# Patient Record
Sex: Male | Born: 1955 | Race: Black or African American | Hispanic: No | Marital: Married | Smoking: Current every day smoker
Health system: Southern US, Community
[De-identification: ages and names within clinical notes are randomized; demographics above are authoritative.]

## PROBLEM LIST (undated history)

## (undated) DIAGNOSIS — W19XXXA Unspecified fall, initial encounter: Secondary | ICD-10-CM

## (undated) DIAGNOSIS — I428 Other cardiomyopathies: Secondary | ICD-10-CM

## (undated) DIAGNOSIS — F1010 Alcohol abuse, uncomplicated: Secondary | ICD-10-CM

## (undated) DIAGNOSIS — I509 Heart failure, unspecified: Secondary | ICD-10-CM

## (undated) DIAGNOSIS — I10 Essential (primary) hypertension: Secondary | ICD-10-CM

## (undated) DIAGNOSIS — R569 Unspecified convulsions: Secondary | ICD-10-CM

## (undated) DIAGNOSIS — I639 Cerebral infarction, unspecified: Secondary | ICD-10-CM

## (undated) DIAGNOSIS — G40909 Epilepsy, unspecified, not intractable, without status epilepticus: Secondary | ICD-10-CM

## (undated) DIAGNOSIS — T82198A Other mechanical complication of other cardiac electronic device, initial encounter: Secondary | ICD-10-CM

## (undated) DIAGNOSIS — Z95810 Presence of automatic (implantable) cardiac defibrillator: Secondary | ICD-10-CM

## (undated) DIAGNOSIS — N179 Acute kidney failure, unspecified: Secondary | ICD-10-CM

## (undated) DIAGNOSIS — M1990 Unspecified osteoarthritis, unspecified site: Secondary | ICD-10-CM

## (undated) HISTORY — DX: Other cardiomyopathies: I42.8

---
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
## 2001-01-30 ENCOUNTER — Emergency Department (HOSPITAL_COMMUNITY): Admission: EM | Admit: 2001-01-30 | Discharge: 2001-01-30 | Payer: Self-pay | Admitting: *Deleted

## 2003-06-19 ENCOUNTER — Emergency Department (HOSPITAL_COMMUNITY): Admission: EM | Admit: 2003-06-19 | Discharge: 2003-06-19 | Payer: Self-pay | Admitting: Emergency Medicine

## 2003-06-20 ENCOUNTER — Encounter (INDEPENDENT_AMBULATORY_CARE_PROVIDER_SITE_OTHER): Payer: Self-pay | Admitting: Cardiology

## 2003-06-20 ENCOUNTER — Inpatient Hospital Stay (HOSPITAL_COMMUNITY): Admission: EM | Admit: 2003-06-20 | Discharge: 2003-06-29 | Payer: Self-pay | Admitting: Emergency Medicine

## 2004-05-31 ENCOUNTER — Emergency Department (HOSPITAL_COMMUNITY): Admission: EM | Admit: 2004-05-31 | Discharge: 2004-05-31 | Payer: Self-pay

## 2004-09-03 ENCOUNTER — Emergency Department (HOSPITAL_COMMUNITY): Admission: EM | Admit: 2004-09-03 | Discharge: 2004-09-03 | Payer: Self-pay | Admitting: Emergency Medicine

## 2005-04-19 ENCOUNTER — Emergency Department (HOSPITAL_COMMUNITY): Admission: EM | Admit: 2005-04-19 | Discharge: 2005-04-19 | Payer: Self-pay | Admitting: *Deleted

## 2005-05-27 HISTORY — PX: CARDIAC DEFIBRILLATOR PLACEMENT: SHX171

## 2005-06-07 ENCOUNTER — Ambulatory Visit: Payer: Self-pay | Admitting: Internal Medicine

## 2005-06-11 ENCOUNTER — Ambulatory Visit: Payer: Self-pay | Admitting: *Deleted

## 2005-06-12 ENCOUNTER — Inpatient Hospital Stay (HOSPITAL_COMMUNITY): Admission: EM | Admit: 2005-06-12 | Discharge: 2005-06-24 | Payer: Self-pay | Admitting: Emergency Medicine

## 2005-06-12 ENCOUNTER — Ambulatory Visit: Payer: Self-pay | Admitting: Pulmonary Disease

## 2005-06-23 ENCOUNTER — Encounter (INDEPENDENT_AMBULATORY_CARE_PROVIDER_SITE_OTHER): Payer: Self-pay | Admitting: *Deleted

## 2005-07-10 ENCOUNTER — Ambulatory Visit: Payer: Self-pay | Admitting: Internal Medicine

## 2006-03-21 ENCOUNTER — Observation Stay (HOSPITAL_COMMUNITY): Admission: EM | Admit: 2006-03-21 | Discharge: 2006-03-21 | Payer: Self-pay | Admitting: Emergency Medicine

## 2006-09-01 ENCOUNTER — Ambulatory Visit: Payer: Self-pay | Admitting: Internal Medicine

## 2006-09-01 ENCOUNTER — Inpatient Hospital Stay (HOSPITAL_COMMUNITY): Admission: EM | Admit: 2006-09-01 | Discharge: 2006-09-03 | Payer: Self-pay | Admitting: Emergency Medicine

## 2006-12-30 ENCOUNTER — Emergency Department (HOSPITAL_COMMUNITY): Admission: EM | Admit: 2006-12-30 | Discharge: 2006-12-31 | Payer: Self-pay | Admitting: Emergency Medicine

## 2007-01-14 ENCOUNTER — Emergency Department (HOSPITAL_COMMUNITY): Admission: EM | Admit: 2007-01-14 | Discharge: 2007-01-14 | Payer: Self-pay | Admitting: Emergency Medicine

## 2007-05-07 ENCOUNTER — Emergency Department (HOSPITAL_COMMUNITY): Admission: EM | Admit: 2007-05-07 | Discharge: 2007-05-07 | Payer: Self-pay | Admitting: Emergency Medicine

## 2007-05-09 ENCOUNTER — Emergency Department (HOSPITAL_COMMUNITY): Admission: EM | Admit: 2007-05-09 | Discharge: 2007-05-09 | Payer: Self-pay | Admitting: *Deleted

## 2007-05-11 ENCOUNTER — Ambulatory Visit (HOSPITAL_COMMUNITY): Admission: RE | Admit: 2007-05-11 | Discharge: 2007-05-11 | Payer: Self-pay | Admitting: *Deleted

## 2009-11-30 ENCOUNTER — Encounter (INDEPENDENT_AMBULATORY_CARE_PROVIDER_SITE_OTHER): Payer: Self-pay | Admitting: Cardiology

## 2009-11-30 ENCOUNTER — Ambulatory Visit: Payer: Self-pay | Admitting: Vascular Surgery

## 2009-11-30 ENCOUNTER — Inpatient Hospital Stay (HOSPITAL_COMMUNITY): Admission: EM | Admit: 2009-11-30 | Discharge: 2009-12-07 | Payer: Self-pay | Admitting: Emergency Medicine

## 2009-12-01 ENCOUNTER — Encounter (INDEPENDENT_AMBULATORY_CARE_PROVIDER_SITE_OTHER): Payer: Self-pay | Admitting: Cardiology

## 2009-12-05 ENCOUNTER — Encounter (INDEPENDENT_AMBULATORY_CARE_PROVIDER_SITE_OTHER): Payer: Self-pay | Admitting: Cardiovascular Disease

## 2009-12-06 ENCOUNTER — Ambulatory Visit: Payer: Self-pay | Admitting: Psychiatry

## 2009-12-07 ENCOUNTER — Ambulatory Visit: Payer: Self-pay | Admitting: Psychiatry

## 2009-12-16 ENCOUNTER — Emergency Department (HOSPITAL_COMMUNITY): Admission: EM | Admit: 2009-12-16 | Discharge: 2009-12-16 | Payer: Self-pay | Admitting: Emergency Medicine

## 2010-08-12 LAB — CBC
HCT: 36 % — ABNORMAL LOW (ref 39.0–52.0)
HCT: 37.6 % — ABNORMAL LOW (ref 39.0–52.0)
HCT: 37.8 % — ABNORMAL LOW (ref 39.0–52.0)
Hemoglobin: 12.4 g/dL — ABNORMAL LOW (ref 13.0–17.0)
Hemoglobin: 12.6 g/dL — ABNORMAL LOW (ref 13.0–17.0)
Hemoglobin: 12.9 g/dL — ABNORMAL LOW (ref 13.0–17.0)
MCH: 32.5 pg (ref 26.0–34.0)
MCH: 33.1 pg (ref 26.0–34.0)
MCH: 33.2 pg (ref 26.0–34.0)
MCHC: 33.6 g/dL (ref 30.0–36.0)
MCHC: 34.2 g/dL (ref 30.0–36.0)
MCHC: 34.4 g/dL (ref 30.0–36.0)
MCV: 96.4 fL (ref 78.0–100.0)
MCV: 96.7 fL (ref 78.0–100.0)
MCV: 96.7 fL (ref 78.0–100.0)
Platelets: 104 10*3/uL — ABNORMAL LOW (ref 150–400)
Platelets: 80 10*3/uL — ABNORMAL LOW (ref 150–400)
Platelets: 86 10*3/uL — ABNORMAL LOW (ref 150–400)
RBC: 3.73 MIL/uL — ABNORMAL LOW (ref 4.22–5.81)
RBC: 3.89 MIL/uL — ABNORMAL LOW (ref 4.22–5.81)
RBC: 3.91 MIL/uL — ABNORMAL LOW (ref 4.22–5.81)
RDW: 14.7 % (ref 11.5–15.5)
RDW: 15 % (ref 11.5–15.5)
RDW: 15.2 % (ref 11.5–15.5)
WBC: 3.5 10*3/uL — ABNORMAL LOW (ref 4.0–10.5)
WBC: 3.9 10*3/uL — ABNORMAL LOW (ref 4.0–10.5)
WBC: 4.2 10*3/uL (ref 4.0–10.5)

## 2010-08-12 LAB — URINALYSIS, ROUTINE W REFLEX MICROSCOPIC
Bilirubin Urine: NEGATIVE
Glucose, UA: NEGATIVE mg/dL
Glucose, UA: NEGATIVE mg/dL
Hgb urine dipstick: NEGATIVE
Hgb urine dipstick: NEGATIVE
Ketones, ur: 15 mg/dL — AB
Ketones, ur: NEGATIVE mg/dL
Leukocytes, UA: NEGATIVE
Nitrite: NEGATIVE
Nitrite: NEGATIVE
Protein, ur: 30 mg/dL — AB
Protein, ur: NEGATIVE mg/dL
Specific Gravity, Urine: 1.006 (ref 1.005–1.030)
Specific Gravity, Urine: 1.022 (ref 1.005–1.030)
Urobilinogen, UA: 0.2 mg/dL (ref 0.0–1.0)
Urobilinogen, UA: 1 mg/dL (ref 0.0–1.0)
pH: 5.5 (ref 5.0–8.0)
pH: 6.5 (ref 5.0–8.0)

## 2010-08-12 LAB — BASIC METABOLIC PANEL
BUN: 11 mg/dL (ref 6–23)
BUN: 12 mg/dL (ref 6–23)
BUN: 12 mg/dL (ref 6–23)
CO2: 28 mEq/L (ref 19–32)
CO2: 28 mEq/L (ref 19–32)
CO2: 29 mEq/L (ref 19–32)
Calcium: 9.2 mg/dL (ref 8.4–10.5)
Calcium: 9.6 mg/dL (ref 8.4–10.5)
Calcium: 9.6 mg/dL (ref 8.4–10.5)
Chloride: 100 mEq/L (ref 96–112)
Chloride: 102 mEq/L (ref 96–112)
Chloride: 97 mEq/L (ref 96–112)
Creatinine, Ser: 0.92 mg/dL (ref 0.4–1.5)
Creatinine, Ser: 1.01 mg/dL (ref 0.4–1.5)
Creatinine, Ser: 1.02 mg/dL (ref 0.4–1.5)
GFR calc Af Amer: >60 mL/min (ref >60)
GFR calc Af Amer: >60 mL/min (ref >60)
GFR calc Af Amer: >60 mL/min (ref >60)
GFR calc non Af Amer: >60 mL/min (ref >60)
GFR calc non Af Amer: >60 mL/min (ref >60)
GFR calc non Af Amer: >60 mL/min (ref >60)
Glucose, Bld: 104 mg/dL — ABNORMAL HIGH (ref 70–99)
Glucose, Bld: 113 mg/dL — ABNORMAL HIGH (ref 70–99)
Glucose, Bld: 114 mg/dL — ABNORMAL HIGH (ref 70–99)
Potassium: 3.4 mEq/L — ABNORMAL LOW (ref 3.5–5.1)
Potassium: 3.6 mEq/L (ref 3.5–5.1)
Potassium: 3.6 mEq/L (ref 3.5–5.1)
Sodium: 134 mEq/L — ABNORMAL LOW (ref 135–145)
Sodium: 135 mEq/L (ref 135–145)
Sodium: 139 mEq/L (ref 135–145)

## 2010-08-12 LAB — POCT I-STAT, CHEM 8
BUN: 18 mg/dL (ref 6–23)
Calcium, Ion: 1.13 mmol/L (ref 1.12–1.32)
Chloride: 102 mEq/L (ref 96–112)
Creatinine, Ser: 1.2 mg/dL (ref 0.4–1.5)
Glucose, Bld: 134 mg/dL — ABNORMAL HIGH (ref 70–99)
HCT: 42 % (ref 39.0–52.0)
Hemoglobin: 14.3 g/dL (ref 13.0–17.0)
Potassium: 4.3 mEq/L (ref 3.5–5.1)
Sodium: 138 mEq/L (ref 135–145)
TCO2: 25 mmol/L (ref 0–100)

## 2010-08-12 LAB — DIFFERENTIAL
Basophils Absolute: 0 10*3/uL (ref 0.0–0.1)
Basophils Relative: 0 % (ref 0–1)
Eosinophils Absolute: 0.1 10*3/uL (ref 0.0–0.7)
Eosinophils Relative: 3 % (ref 0–5)
Lymphocytes Relative: 45 % (ref 12–46)
Lymphs Abs: 1.8 10*3/uL (ref 0.7–4.0)
Monocytes Absolute: 0.3 10*3/uL (ref 0.1–1.0)
Monocytes Relative: 9 % (ref 3–12)
Neutro Abs: 1.7 10*3/uL (ref 1.7–7.7)
Neutrophils Relative %: 43 % (ref 43–77)

## 2010-08-12 LAB — COMPREHENSIVE METABOLIC PANEL
ALT: 175 U/L — ABNORMAL HIGH (ref 0–53)
ALT: 185 U/L — ABNORMAL HIGH (ref 0–53)
AST: 267 U/L — ABNORMAL HIGH (ref 0–37)
AST: 293 U/L — ABNORMAL HIGH (ref 0–37)
Albumin: 3.8 g/dL (ref 3.5–5.2)
Albumin: 3.9 g/dL (ref 3.5–5.2)
Alkaline Phosphatase: 101 U/L (ref 39–117)
Alkaline Phosphatase: 101 U/L (ref 39–117)
BUN: 14 mg/dL (ref 6–23)
BUN: 15 mg/dL (ref 6–23)
CO2: 26 mEq/L (ref 19–32)
CO2: 26 mEq/L (ref 19–32)
Calcium: 9.3 mg/dL (ref 8.4–10.5)
Calcium: 9.4 mg/dL (ref 8.4–10.5)
Chloride: 102 mEq/L (ref 96–112)
Chloride: 102 mEq/L (ref 96–112)
Creatinine, Ser: 1.05 mg/dL (ref 0.4–1.5)
Creatinine, Ser: 1.06 mg/dL (ref 0.4–1.5)
GFR calc Af Amer: >60 mL/min (ref >60)
GFR calc Af Amer: >60 mL/min (ref >60)
GFR calc non Af Amer: >60 mL/min (ref >60)
GFR calc non Af Amer: >60 mL/min (ref >60)
Glucose, Bld: 77 mg/dL (ref 70–99)
Glucose, Bld: 81 mg/dL (ref 70–99)
Potassium: 4.2 mEq/L (ref 3.5–5.1)
Potassium: 4.3 mEq/L (ref 3.5–5.1)
Sodium: 140 mEq/L (ref 135–145)
Sodium: 141 mEq/L (ref 135–145)
Total Bilirubin: 0.8 mg/dL (ref 0.3–1.2)
Total Bilirubin: 1.1 mg/dL (ref 0.3–1.2)
Total Protein: 6.7 g/dL (ref 6.0–8.3)
Total Protein: 6.8 g/dL (ref 6.0–8.3)

## 2010-08-12 LAB — LIPID PANEL
Cholesterol: 206 mg/dL — ABNORMAL HIGH (ref 0–200)
HDL: 85 mg/dL (ref >39)
LDL Cholesterol: 110 mg/dL — ABNORMAL HIGH (ref 0–99)
Total CHOL/HDL Ratio: 2.4 RATIO
Triglycerides: 54 mg/dL (ref <150)
VLDL: 11 mg/dL (ref 0–40)

## 2010-08-12 LAB — RAPID URINE DRUG SCREEN, HOSP PERFORMED
Amphetamines: NOT DETECTED
Barbiturates: NOT DETECTED
Benzodiazepines: NOT DETECTED
Cocaine: NOT DETECTED
Opiates: NOT DETECTED
Tetrahydrocannabinol: NOT DETECTED

## 2010-08-12 LAB — POCT CARDIAC MARKERS
CKMB, poc: 1.4 ng/mL (ref 1.0–8.0)
Myoglobin, poc: 97.4 ng/mL (ref 12–200)
Troponin i, poc: 0.05 ng/mL (ref 0.00–0.09)

## 2010-08-12 LAB — CARDIAC PANEL(CRET KIN+CKTOT+MB+TROPI)
CK, MB: 1.9 ng/mL (ref 0.3–4.0)
Relative Index: 1.6 (ref 0.0–2.5)
Total CK: 122 U/L (ref 7–232)
Troponin I: 0.02 ng/mL (ref 0.00–0.06)

## 2010-08-12 LAB — PROTIME-INR
INR: 1.03 (ref 0.00–1.49)
INR: 1.04 (ref 0.00–1.49)
Prothrombin Time: 13.4 seconds (ref 11.6–15.2)
Prothrombin Time: 13.5 seconds (ref 11.6–15.2)

## 2010-08-12 LAB — GLUCOSE, CAPILLARY
Glucose-Capillary: 117 mg/dL — ABNORMAL HIGH (ref 70–99)
Glucose-Capillary: 121 mg/dL — ABNORMAL HIGH (ref 70–99)

## 2010-08-12 LAB — URINE MICROSCOPIC-ADD ON

## 2010-08-12 LAB — HEMOGLOBIN A1C
Hgb A1c MFr Bld: 5.9 % — ABNORMAL HIGH (ref <5.7)
Mean Plasma Glucose: 123 mg/dL — ABNORMAL HIGH (ref <117)

## 2010-08-12 LAB — ETHANOL: Alcohol, Ethyl (B): 187 mg/dL — ABNORMAL HIGH (ref 0–10)

## 2010-08-12 LAB — APTT
aPTT: 25 seconds (ref 24–37)
aPTT: 26 seconds (ref 24–37)

## 2010-08-12 LAB — BRAIN NATRIURETIC PEPTIDE
Pro B Natriuretic peptide (BNP): 121 pg/mL — ABNORMAL HIGH (ref 0.0–100.0)
Pro B Natriuretic peptide (BNP): 46 pg/mL (ref 0.0–100.0)

## 2010-08-12 LAB — ALCOHOL, METHYL (METHANOL), BLOOD: Methanol Lvl: NEGATIVE

## 2010-10-12 NOTE — Consult Note (Signed)
[name] Phillips, [name] Phillips                ACCOUNT NO.:  [id number]   MEDICAL RECORD NO.:  [id number]          PATIENT TYPE:  INP   LOCATION:  6735                         FACILITY:  [facility]   PHYSICIAN:  Francisca December, M.D.  DATE OF BIRTH:  [birth year]   DATE OF CONSULTATION:  06/17/2005  DATE OF DISCHARGE:                                   CONSULTATION   REQUESTING PHYSICIAN:  Dr. Armanda Magic.   REASON FOR CONSULTATION:  Consider implantable cardio-defibrillator  insertion.   HISTORY OF PRESENT ILLNESS:  [name] Phillips is an unfortunate 55 year old  man who was admitted to [facility], June 11, 2005, in acute pulmonary  edema and severe dyspnea.  He had ventilator-dependent respiratory failure  and was subsequently mechanically ventilated for 36 hours.  He was  extubated, but confused for the next 36-48 hours.  Today, he is responsive  and appropriate.   He has a long history of hypertension and as well, chronic medication  noncompliance.  He has seen Dr. Mayford Knife for some time in the office.  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   He denies any history of tachy-palpitation, lightheadedness,  lightheadedness, syncope or near-syncope.   He was intubated almost immediately on presentation of the patient to the  emergency room.  Most of the history was obtained from his brother.  He  recalls becoming more and more short of breath over approximately 7-10 days  prior to the admission.  He denies any pressure or heaviness in his chest.   PAST MEDICAL HISTORY:  1.  Known non-ischemic cardiomyopathy, as above.  2.  History of hypertension.   MEDICATIONS ON ADMISSION:  Coreg, benazepril, spironolactone and trazodone.   DRUG ALLERGIES:  PENICILLIN and CODEINE.   PAST SURGICAL HISTORY:  None.   FAMILY HISTORY:  Not clearly known.  He recalls his father having some heart  problems.   SOCIAL HISTORY:  He quit smoking 2 years ago.  He denies any  illicit drug  use and no ethanol, although there has been significant concern for alcohol  withdrawal here in the hospital and he has been placed on withdrawal  precautions.   REVIEW OF SYSTEMS:  Review of systems otherwise negative.  He does note some  left arm, especially when his blood pressure is being taken.   PHYSICAL EXAMINATION:  VITAL SIGNS:  The blood pressure is 99/75.  The pulse  is 109 and regular.  Respiratory rate is 18.  Temperature 98.2.  O2  saturation on room air 97%.  GENERAL:  This is a thin, poorly conversant African American male.  He seems  to be somewhat sedated during the interview, but answered appropriately to  questions and stated he understood my questions and comments.  HEENT:  Unremarkable.  NECK:  Supple without thyromegaly or masses.  The carotid upstrokes are  normal.  There are no bruits.  CHEST:  Clear with adequate excursion.  HEART:  The heart has a regular tachycardic rhythm.  There is a soft S3  present.  No murmur, click or  rub.  ABDOMEN:  Soft and nontender.  No midline pulsatile mass.  GU:  External genitalia are without lesions.  RECTAL:  Not performed.  EXTREMITIES:  Full range of motion.  The lower extremities show no edema and  intact distal pulses.  The left upper extremity though has warmth and an  obvious erythematous cord moving up the arm from an IV site in the forearm.  The cord is tender and firm, extending into the left arm.  NEUROLOGICAL:  Cranial nerves are grossly intact, as are motor and sensory.  Gait is not tested.  SKIN:  Warm, dry and clear.   ACCESSORY CLINICAL DATA:  Most recent serum electrolytes were normal, BUN  29, creatinine 1.2, blood sugar 110.  The BNP was 502.   Electrocardiogram:  This shows sinus rhythm, occasional PVC.  QRS is not  significantly widened.   ASSESSMENT:  1.  Non-ischemic cardiomyopathy.  Patient clearly meets ValHeFT criteria for      prophylactic insertion of an implantable  cardio-defibrillator.  2.  Likely alcohol abuse/withdrawal with benzodiazepine sedation at this      time.  3.  Recent ventilator-dependent respiratory failure/pulmonary edema, now      resolved.  4.  History of hypertension.  5.  Resolved renal insufficiency.  6.  Septic superficial thrombophlebitis, left arm.   PLAN/RECOMMENDATIONS:  1.  I feel the patient would be an adequate candidate for placement of an      ICD and he has been counseled for such.  I would decrease his Ativan      though, as he seems somewhat sedated.  2.  I will begin vancomycin for acute superficial thrombophlebitis.  The      patient is penicillin-allergic.  3.  Discontinue IV in the arm, no future left arm IVs or blood pressures.  4.  Thank you very much for allowing me to assist in the care of [name] Phillips.  It has been a pleasure to do so.  I will discuss his further      care with you.      Francisca December, M.D.  Electronically Signed     JHE/MEDQ  D:  06/17/2005  T:  06/18/2005  Job:  [id number]

## 2010-10-12 NOTE — Consult Note (Signed)
NAME:  [name] Phillips, [name] Phillips                          ACCOUNT NO.:  [id number]   MEDICAL RECORD NO.:  [id number]                   PATIENT TYPE:  INP   LOCATION:  2003                                 FACILITY:  [facility]   PHYSICIAN:  Armanda Magic, M.D.                  DATE OF BIRTH:  08-05-1955   DATE OF CONSULTATION:  06/22/2003  DATE OF DISCHARGE:                                   CONSULTATION   CHIEF COMPLAINT:  New onset of congestive heart failure, hypertensive  cardiomyopathy.   HISTORY:  This is a 55 year old African American male who presented with a  five day history of cough with paroxysmal PND and also cough with green pink  sputum. He presented to the hospital and his initial chest x-ray was  consistent with CHF.  A subsequent echo showed marked LV dysfunction with EF  of 20 to 30%.  Also, of note, the patient was markedly hypertensive on  admission to the hospital and his blood pressure was 142/118.  Apparently,  he has had a history of hypertension in the past which has not been well  treated.  In discussion with the patient, he has a history of occasional  nocturnal chest pain when supine. It is located in the anterior chest with  radiation in the axilla. It only occurs when lying supine.  He is very  active and apparently rides his bike every day up to 11 miles without any  chest pain.  No dyspnea on exertion, shortness of breath or orthopnea. His  shortness of breath he presented with is completely new.  He complains of  frequent palpitations when off his blood pressure medicine. He usually takes  verapamil. He has not had any diaphoresis or nausea with his chest pain.   PAST MEDICAL HISTORY:  Significant for hypertension. He has been off  medications for eight months.   FAMILY HISTORY:  Father had problems with his hear although he is unsure.   SOCIAL HISTORY:  He is a smoker.  He smokes one-half pack per day for the  past 30 years.  He also drinks alcohol, 24 hours of  beer every two days. No  illicit drug use.  He does have a history of smoking weed. No IV drug use.   ALLERGIES:  TO PENICILLIN AND CODEINE.   INPATIENT MEDICATIONS:  1. Altace 10 mg a day.  2. K-Dur 30 mEq b.i.d.  3. Aspirin 81 mg a day.  4. Lovenox 40 mg subcu b.i.d.  5. Nicotine patch.  6. Lasix 40 mg b.i.d.  7. Avelox.  8. Coreg 3.125 mg b.i.d.   REVIEW OF SYSTEMS:  Other than what is stated in the HPI is negative.   PHYSICAL EXAMINATION:  GENERAL:  Well developed, well nourished, black male  in no acute distress.  HEENT: Benign.  NECK: Supple without lymphadenopathy.  Carotid upstrokes +2 bilaterally and  no bruits.  CHEST: There are a few crackles in the bases bilaterally.  LUNGS:  Clear to auscultation throughout.  HEART:  Regular rate and rhythm. No murmurs, rubs or gallops.  Normal S1 and  S2.  ABDOMEN: Soft, nontender, nondistended with active bowel sounds. No  hepatosplenomegaly.  EXTREMITIES:  No edema.   LABORATORY DATA:  BNP is 964 and then went to 1370, now back down to 718.  MBs are 2.9, 1.7, and 1.4. Troponin less than 0.05, 0.03, and 0.03.  CPK is  96 and 87. TSH is 1.839.  Sodium 138, potassium 4.2, chloride  104, bicarb  27, BUN 15, creatinine 1.4, glucose 107, white cell count 6.2. Hemoglobin  13.2.  Hematocrit 39.1. Platelet count 167.  Chest x-ray shows cardiomegaly  with some pulmonary edema, small bilateral pleural effusions. EKG shows  normal sinus rhythm at 64 beats/minute with marked LVH and repolarization  change, prolonged QT at 466 milliseconds. This is secondary to  repolarization abnormality.  2-D echocardiogram showed markedly decreased LV  systolic function, EF of 20 to 30%, severe diffuse LV hypokinesis, mild  increase in LV wall thickness, mild MR, markedly dilated left atrium and  enlarged left pleural effusion.   IMPRESSION:  1. Congestive heart failure, most likely secondary to hypertensive     cardiomyopathy, only the left-sided  chambers are dilated which would go     along more with hypertensive cardiomyopathy than alcoholic     cardiomyopathy. Also, need to rule out underlying ischemia. He does have     some atypical chest pain but only occurs when he is lying supine. He     rides his bike 11 miles on occasion with no exertional chest pain or     shortness of breath.  2. Palpitations. Question whether it could be related to PAF since he does     have a markedly dilated left atrium as well.  3. Hypertension with elevated blood pressure.  4. Tobacco abuse on Nicotine patch.  5. Probable bronchitis.   PLAN:  1. Agree with continuing ACE inhibitor, Coreg and Lasix without     spironolactone as well.  Would hold off on any potassium supplements     while starting spironolactone and watch potassium level closely.     Continue Lovenox and aspirin with stress Cardiolite tomorrow morning to     rule out inducible ischemia.  2. Needs aggressive blood pressure management.                                               Armanda Magic, M.D.    TT/MEDQ  D:  06/22/2003  T:  06/22/2003  Job:  [id number]   cc:   Deirdre Peer. Polite, M.D.

## 2010-10-12 NOTE — Consult Note (Signed)
NAMEQUINTA, LABELLA                ACCOUNT NO.:  [id number]   MEDICAL RECORD NO.:  [id number]          PATIENT TYPE:  OBV   LOCATION:  2550                         FACILITY:  [facility]   PHYSICIAN:  Jordan Hawks. Elnoria Howard, MD    DATE OF BIRTH:  08-09-1955   DATE OF CONSULTATION:  03/21/2006  DATE OF DISCHARGE:                                   CONSULTATION   REASON FOR CONSULTATION:  Dysphagia for one week.   HISTORY OF PRESENT ILLNESS:  This is a 55 year old gentleman with a past  medical history of dilated nonischemic cardiomyopathy with an ejection  fraction of 20% status post AICD placement and hypertension, nonsustained  ventricular tachycardia and past history of alcohol use who presents to the  emergency room with complaints of dysphagia and odynophagia for the past  week. He states that his symptoms were acute in onset and since that time he  has had difficulty with swallowing, both to solids and liquids.  He denies  any prior prodromal symptoms before this time. There is no significant  history of vomiting but he does have a nauseous sensation. Over the interval  time, the patient states that he has weight loss, however, he is not able to  quantify the exact amount of weight.  The patient denies any abdominal pain  or gastroesophageal reflux disease.  No reports of any diarrhea,  constipation, fevers or chills.  Additionally, he denies having any type of  chest pain or shortness of breath at this time.   PAST MEDICAL HISTORY AND PAST SURGICAL HISTORY:  As stated above.   FAMILY HISTORY:  Noncontributory.   SOCIAL HISTORY:  Positive for tobacco use, approximately 5-6 cigarettes for  several decades, positive for alcohol use, however, he says this is  occasional, and he denies any illicit drug use.   MEDICATIONS:  The patient denies taking any medications at this time despite  his hypertension history.   REVIEW OF SYSTEMS:  As stated in the history present illness.  It was also  negative for dizziness, blurry vision, tinnitus, dysarthria, dysuria,  arthritis, arthralgias, skin rashes, fevers, chills, diarrhea, constipation.   ALLERGIES:  PENICILLIN AND CODEINE.   PHYSICAL EXAMINATION:  VITAL SIGNS:  Blood pressure is 160/110, heart rate  is in the 80s to 90s range, respirations 18, pulse ox is 95% on room air.  GENERAL:  The patient is in no acute distress.  However, he appears to be  uncomfortable.  HEENT:  Normocephalic, atraumatic.  Extraocular muscles intact.  Pupils  equal, round, reactive to light.  NECK:  Supple.  No lymphadenopathy.  LUNGS:  Clear to auscultation bilaterally.  CARDIOVASCULAR:  Regular rhythm.  ABDOMEN:  Flat, soft, nontender, nondistended.  EXTREMITIES:  No clubbing, cyanosis or edema.   LABORATORY DATA:  White blood cell count is 4.1, hemoglobin 13.5, MCV is  91.6, platelets at 206.  PT is 14.6, INR 1.1, and PTT is 28.   IMPRESSION:  1. Dysphagia.  2. High blood pressure.  3. History of CHF status post nonsustained ventricular tachycardia with      AICD placement.  After evaluation of the patient, it is apparent that he requires further  evaluation.  It appears that his dysphagia may be from motility disorder as  he has both dysphagia to solids and liquids.  He is able to manage his  secretions at this time. He may also exhibit a Candida esophagitis.  However, further evaluation of with EGD is required. Because of his severe  CHF history as well as hypertension, the plan is to use light sedation and a  pediatric endoscope in order to minimize any type of physical stress that  the patient may undergo during the procedure. Further recommendations will  be given once the patient is status post EGD.      Jordan Hawks Elnoria Howard, MD  Electronically Signed     PDH/MEDQ  D:  03/21/2006  T:  03/22/2006  Job:  [id number]

## 2010-10-12 NOTE — Discharge Summary (Signed)
NAMEKVION, SHAPLEY                ACCOUNT NO.:  [id number]   MEDICAL RECORD NO.:  [id number]          PATIENT TYPE:  INP   LOCATION:  3739                         FACILITY:  [facility]   PHYSICIAN:  Francisca December, M.D.  DATE OF BIRTH:  05/29/1965   DATE OF ADMISSION:  09/01/2006  DATE OF DISCHARGE:  09/03/2006                               DISCHARGE SUMMARY   DISCHARGE DIAGNOSES:  1. Nonischemic cardiomyopathy with ejection fraction of 20%; history      of defibrillator implantation.  2. Chronic compensated left ventricular systolic heart failure.  3. History of nonsustained ventricular tachycardia.  4. Hypertension.  5. History of alcohol and tobacco abuse.  6. History of left pleural effusion with thoracentesis January of      2007.  7. Renal insufficiency, baseline creatinine of 1.4.  8. ALLERGY TO PENICILLIN AND CODEINE.  9. Family history of heart disease; his father died of a sudden death.   Mr. Michels was admitted on September 01, 2006 after experiencing four  shocks in his yard.  He called 911.  He has coronary history as  outlined above.  His device was interrogated and Dr. Lewayne Bunting of the  EP service saw the patient and felt that he had a tachycardia that looks  very much like his intrinsic QRS.  All these findings on his electrogram  suggest, but are not conclusive of SVT (sinus tachy versus atrial  tachycardia).  His device was reprogrammed through the VT zone, Coreg  was started and will be titrated, and again we have encouraged the  patient to comply medically.   On September 03, 2006, the patient was ready for discharge to home.   Please note during his hospitalization, he did have a transthoracic  echocardiogram, EF of 55 to 60% with no wall motion abnormalities, LV  wall thickness was mildly increased, aortic root was at the upper limits  of normal, and again there was appearance of a catheter/pacing wire in  the right atrium.   EKG:  Normal sinus rhythm, rate 98  with T wave inversion laterally, LVH.   Hemoglobin 12.9, hematocrit 38, platelets 164, white count 5.7, sodium  136, potassium 4.1, BUN 8, creatinine 0.98.  He did have elevation of  troponin at 0.20, but his CK-MBs were negative.  This was felt to be  secondary to his defibrillator.  TSH 1.878.   DISCHARGE MEDICATIONS:  1. Enteric-coated aspirin 325 mg a day.  2. Coreg 12.5 mg p.o. b.i.d.  3. Lisinopril 10 mg p.o. b.i.d.   He is to stop all other medications including Isordil and hydralazine.   He is to go to Dr. Amil Amen' office for a treadmill study and office will  call the patient.   He is to remain on a low sodium, heart-healthy diet, increase activity  slowly and call us for any problems, questions or concerns.      Guy Franco, P.A.      Francisca December, M.D.  Electronically Signed    LB/MEDQ  D:  11/20/2006  T:  11/20/2006  Job:  [id number]   cc:  Doylene Canning. Ladona Ridgel, MD

## 2010-10-12 NOTE — Discharge Summary (Signed)
NAME:  CLAYT, VONADA                          ACCOUNT NO.:  [id number]   MEDICAL RECORD NO.:  [id number]                   PATIENT TYPE:  INP   LOCATION:  2003                                 FACILITY:  [facility]   PHYSICIAN:  Sherin Quarry, MD                   DATE OF BIRTH:  1956-03-29   DATE OF ADMISSION:  06/20/2003  DATE OF DISCHARGE:  06/29/2003                                 DISCHARGE SUMMARY   HOSPITAL COURSE:  [name] Phillips is a 55 year old male with a history of  hypertension and previous diagnosis of congestive heart failure who  initially presented to the emergency room on January 24 with dyspnea, cough,  and profound dyspnea on exertion. In the [facility] Emergency Room a chest x-  ray revealed evidence of bilateral infiltrates and effusions with associated  cardiomegaly consistent with congestive heart failure. The patient was  therefore admitted to the hospital by Dr. Nehemiah Settle. On Dr. Idelle Crouch physical  exam his blood pressure was 142/118, pulse was 114, respirations 30. O2  saturation was 90% on room air. HEENT exam was remarkable for jugular vein  distention. The chest was notable for decreased breath sounds at the bases.  On the cardiovascular exam an S3 gallop was audible. There were frequent  ectopic beats. The abdomen was benign. There were bowel sounds without  masses, tenderness, or organomegaly. Examination of the extremities revealed  no evidence of cyanosis or edema. Other relevant studies obtained included  CBC, which revealed a white count of 4600, hemoglobin 14.3, hematocrit 42.  Sodium was 140, potassium 3.9, creatinine 1.1, BUN 13, glucose 108. Serial  cardiac enzymes were negative. Lipid profile revealed an HDL cholesterol of  29, LDL of 107, TSH was normal. The patient was seen in consultation by Dr.  Armanda Magic who recommended the patient be treated for his hypertension and  congestive heart failure with Lasix, an ACE inhibitor, Coreg, and Aldactone.  She emphasized the importance of smoking cessation. An echocardiogram was  obtained, which showed mild dilatation of the left ventricle. The left  ventricular systolic function was noted to be markedly decreased. It was  felt to be 20-30%. Severe diffuse left ventricular hypokinesis was  identified. The left atrium was noted to be markedly dilated. The patient  also had a myocardial perfusion study, which showed severely impaired left  ventricular ejection fraction of 23% with global hypokinesis and perfusion  defects seen in the inferolateral and posterolateral walls of the left  ventricle. The patient was monitored on telemetry for a period of time and  during this time had two episodes of nonsustained ventricular tachycardia.  On February 1 the patient underwent cardiac catheterization. The preliminary  report on this study was that his coronary arteries were widely patent and  appear to be completely normal. Left ventricular dysfunction was again noted  with an ejection fraction estimated to be 20%. The patient  was therefore  felt to have a nonischemic cardiomyopathy and it was felt that the key to  his management was aggressive treatment of his hypertension. On February 2  the patient was discharge.   DISCHARGE DIAGNOSES:  1. Hypertension.  2. Acute congestive heart failure.  3. Severe nonischemic cardiomyopathy with an ejection fraction of 20%.  4. Nonsustained ventricular tachycardia.  5. History of cigarette smoking.   DISCHARGE MEDICATIONS:  1. Altace 5 mg daily.  2. Lasix 20 mg daily.  3. Aspirin 325 mg daily.  4. Coreg 12.5 mg b.i.d.   DISCHARGE FOLLOW UP:  Follow-up will be with Dr. Armanda Magic in two weeks  to assess his response to therapy.   CONDITION ON DISCHARGE:  Fair.                                                Sherin Quarry, MD    SY/MEDQ  D:  06/29/2003  T:  06/29/2003  Job:  [id number]   cc:   Armanda Magic, M.D.  [address]   [place], [place] [address]  Fax: [phone]

## 2010-10-12 NOTE — Cardiovascular Report (Signed)
NAME:  [name] Phillips, [name] Phillips                          ACCOUNT NO.:  [id number]   MEDICAL RECORD NO.:  [id number]                   PATIENT TYPE:  INP   LOCATION:  2003                                 FACILITY:  [facility]   PHYSICIAN:  Armanda Magic, M.D.                  DATE OF BIRTH:  1955/10/28   DATE OF PROCEDURE:  06/28/2003  DATE OF DISCHARGE:  06/29/2003                              CARDIAC CATHETERIZATION   PROCEDURE:  Left heart catheterization, coronary angiography, left  ventriculography.   CARDIOLOGIST:  Armanda Magic, M.D.   REFERRING PHYSICIAN:  Deirdre Peer. Polite, M.D.   INDICATIONS:  Congestive heart failure, idiopathic dilated cardiomyopathy.   COMPLICATIONS:  None.   INTRAVENOUS ACCESS:  Via right femoral vein, 6 French sheath.   INDICATIONS:  This is a 55 year old black male with a history of  hypertension with noncompliance of medications who presented with  hypertensive urgency and was found to be in congestive heart failure.  A 2-D  echocardiogram revealed a dilated cardiomyopathy with markedly reduced LV  systolic function.  RV and RA are normal.  He underwent stress Cardiolite  study which showed a questionable lateral wall ischemia.  He now presents  for cardiac catheterization.   DESCRIPTION OF PROCEDURE:  The patient is brought to the cardiac  catheterization laboratory in the fasting, nonsedated state.  Informed  consent was obtained.  The patient was connected to continuous heart rate  and pulse oximetry monitoring and intermittent blood pressure monitoring.  The right groin was prepped and draped in a sterile fashion.  Then 1%  Xylocaine was used for local anesthesia.  Using the modified Seldinger  technique, the 6 French sheath was placed in the right femoral artery.  Under fluoroscopic a 7.5 [place] JL4 catheter was placed in the left coronary  artery.  Multiple cine films were taken in a 30 degree RAO and 40 degree LAO  view.  This catheter was then exchanged  out over a guidewire for a 6 [place]  JL5 catheter because adequate engagement of coronary ostia was unsuccessful.  The left main is very, very short and essentially there was a common ostium  to the LAD and the left circumflex.  This catheter was subselective in both  the LAD and left circumflex and cine films were taken in 30 degree RAO views  and 40 degree LAO views.  The catheter was then exchanged out over a  guidewire and a 6 [place] JR4 catheter was placed under fluoroscopic guidance  in the left coronary artery.  Multiple cine films were taken in the 30  degree RAO and 40 degree LAO views.  This catheter was then exchanged out  over a guidewire for a 6 French angled pigtail catheter which was placed  under fluoroscopic guidance in the left ventricular cavity.  The left  ventriculography was performed in the 30 degree RAO view and a total of 30  mL of contrast at 15 mL per second.  The catheter was then pulled back  across the aortic valve with no significant gradient.  At the end of the  procedure, all catheters and sheaths were removed.  Manual compression was  performed while adequate hemostasis was obtained.  The patient was  transferred back to the room in stable condition.   RESULTS:  1. The left main coronary artery is very short and essentially there is a     common ostium between the left anterior descending and the left     circumflex.  2. The left anterior descending widely patent throughout its course giving     rise to two diagonal branches both of which are widely patent.  There are     also two large septal perforators that come off and traverse to the apex     of the heart.  3. The left circumflex is a large vessel and gives rise to a very high     obtuse marginal I or ramus branch which is patent.  The left circumflex     then is widely patent throughout its proximal and mid portion.  In the     midportion it gives rise to a second obtuse marginal branch which is  a     large vessel and bifurcates distally into daughter branches.  The entire     vessel was widely patent.  The rest of the left circumflex traverses the     AV groove and bifurcates distally in two daughter vessels both of which     widely patent.  4. The right coronary artery is widely patent throughout its course and     bifurcates into the posterior descending artery and posterior lateral     artery, both of which are widely patent.  5. Left ventriculography is performed and shows severe left ventricular     dysfunction with dilated left ventricular cavity mentioned via 20%.  Left     ventricular pressure 123/22 mmHg.  LVEDP is 36 mmHg.  Aortic pressure     127/91 mmHg.   ASSESSMENT:  1. Normal coronary arteries.  2. Severe left ventricular dysfunction, EF 20%.  3. Markedly elevated left ventricular end-diastolic pressure secondary to     systolic and probable diastolic dysfunction from hypertension.  4. Idiopathic dilated cardiomyopathy, probably secondary to severe     hypertension.  5. Nonsustained ventricular tachycardia, no further episodes since     yesterday.   PLAN:  1. Rule out secondary causes of cardiomyopathy.  Check an HIV, serum     electrophoresis, ferritin.  2. Aggressive treatment for blood pressure.  3. Increase beta blockers for nonsustained ventricular tachycardia.  Will     increase Coreg to 12.5 mg b.i.d.  4. Needs a BMET check in one week.  5. Okay to discharge to home today after bed rest.  6. Follow up with Dr. Mayford Knife in two weeks.                                               Armanda Magic, M.D.    TT/MEDQ  D:  06/28/2003  T:  06/29/2003  Job:  [id number]   cc:   Deirdre Peer. Polite, M.D.

## 2010-10-12 NOTE — Discharge Summary (Signed)
[name] Phillips, [name] Phillips                ACCOUNT NO.:  [id number]   MEDICAL RECORD NO.:  [id number]          PATIENT TYPE:  INP   LOCATION:  6735                         FACILITY:  [facility]   PHYSICIAN:  Armanda Magic, M.D.     DATE OF BIRTH:  April 12, 1956   DATE OF ADMISSION:  06/11/2005  DATE OF DISCHARGE:  06/24/2005                                 DISCHARGE SUMMARY   DISCHARGE DIAGNOSIS:  1.  Pulmonary edema resulting in ventilator dependence, resolved.  2.  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  3.  Hypertension.  4.  Nonsustained ventricular tachycardia.  5.  Long term medication use.  6.  Probable alcohol use.  7.  Resolved renal insufficiency.  8.  Left arm superficial thrombophlebitis, treated, improved.   HOSPITAL COURSE:  [name] Phillips is a 55 year old male patient who was admitted  after a one week history of worsening shortness of breath.  In the emergency  room, he was quickly intubated secondary to his respiratory distress  secondary to pulmonary edema.  Critical care service followed the patient  for vent management.  The patient was then treated aggressively with  medications and was quickly extubated.  The patient was seen in consultation  by Dr. Corliss Marcus and was felt to be a candidate for an implantable  cardiodefibrillator.  Ultimately, on June 20, 2005, a Medtronic AICD was  implanted by Dr. Amil Amen.  The patient tolerated the procedure well.  The  following day, the patient had left arm swelling and minimal tenderness.  An  ultrasound was performed but was negative for DVT.  [facility]  to evaluate the patient during this case.  Eventually, the patient was found  to have a left pleural effusion, as well, and underwent a thoracentesis.  By  June 24, 2005, the x-ray showed small left pleural effusion without  atelectasis and no pneumothorax.  We felt he was ready for discharge home.   PERTINENT LABORATORY DATA:  White count 6, hemoglobin  14, hematocrit 41.7,  platelets 240.  Sodium 142, potassium 3.5, BUN 15, creatinine 1.4.  BNP on  arrival 1180.  CK MBs were negative and maximum troponin was 0.12 in the  setting of acute pulmonary edema.  Pleural fluid revealed LDH 119.  Blood  culture was negative.   The patient was discharged to home on June 24, 2005, in stable condition  on the following medications:  Coreg 12.5 mg p.o. b.i.d., Altace 5 mg p.o.  b.i.d., aspirin one daily, Hydralazine 25 mg t.i.d., Isordil 10 mg b.i.d.,  Indocin 25 mg p.o. b.i.d. for seven days, Zantac 150 mg p.o. b.i.d. for one  month, Percocet 1-2 tablets every 6 hours p.r.n. pain.  He has been asked to  stop his Lasix.   He is to follow up with Dr. Amil Amen on July 02, 2005, at 10:20 a.m. for a  device check.  He is scheduled to follow up with Dr. Mayford Knife on July 08, 2005, at 1:30 p.m.  He is to make a follow up appointment at [facility] to  be seen in two weeks.  He is to call for any questions or concerns.  We gave  him an activity instruction sheet regarding AICD placement.      Guy Franco, P.A.      Armanda Magic, M.D.  Electronically Signed    LB/MEDQ  D:  08/15/2005  T:  08/17/2005  Job:  [id number]

## 2010-10-12 NOTE — Op Note (Signed)
[name] Phillips, [name] Phillips                ACCOUNT NO.:  [id number]   MEDICAL RECORD NO.:  [id number]          PATIENT TYPE:  INP   LOCATION:  6735                         FACILITY:  [facility]   PHYSICIAN:  Francisca December, M.D.  DATE OF BIRTH:  December 16, 1955   DATE OF PROCEDURE:  06/20/2005  DATE OF DISCHARGE:                                 OPERATIVE REPORT   PROCEDURES PERFORMED:  1.  Right subclavian venogram.  2.  Insertion of single-chamber implantable cardiac defibrillator.  3.  Defibrillation threshold testing.   SURGEON:  Francisca December, M.D.   INDICATIONS:  Mr. Perpich is a 55 year old man man with a known nonischemic  cardiomyopathy present times years.  He was recently admitted with recurrent  CHF and pulmonary edema.  That has been appropriately treated and resolved.  He has had this degree of LV dysfunction for greater than 2 years now.  Nonsustained ventricular tachycardia was seen on admission, but this was  very brief.  He is brought to catheterization laboratory now for insertion  of a single-chamber implantable cardiac defibrillator under the ValHeFT  criteria for prevention of sudden cardiac death.   PROCEDURAL NOTE:  The patient was brought to the cardiac catheterization  laboratory in the fasting state.  The right prepectoral region was prepped  and draped in the usual sterile fashion.  Local anesthesia was obtained with  the infiltration of 1% lidocaine with epinephrine throughout the right  prepectoral region.  A right subclavian venogram was then performed with a  peripheral injection of 20 mL of Omnipaque.  A digital cine angiogram in the  AP projection was obtained and road-mapped to guide future right  subclavian puncture.  The venogram did demonstrate the vein to be widely  patent and coursing in a normal fashion over the anterior surface of the  first rib and beneath the middle third of the clavicle.   A 7- to 8-cm incision was made in the deltopectoral groove and  this was  carried down by sharp dissection and electrocautery to the prepectoral  fascia.  There a plane was lifted and a pocket formed inferiorly and  medially using blunt dissection and electrocautery.  The pocket was then  packed with a 1% kanamycin-soaked gauze.  A single right subclavian puncture  was then performed using an 18-gauge thin-wall needle, through which was  passed a 0.038-inch tight J guidewire.  Over this guidewire, a 9-French  tearaway sheath and dilator were advanced.  The dilator was removed and the  wire was allowed to remain in place.  The pace-shock lead was advanced to  the level of the right atrium and the sheath was torn away.  Using standard  technique and fluoroscopic landmarks, the lead was manipulated into the  right ventricular apex.  This was an active-fixation lead and the screw was  advanced as appropriate under fluoroscopy.  The lead was tested for  diaphragmatic pacing at 10 volts.  Excellent pacing parameters were  obtained, as will be noted below.  The kanamycin-soaked gauze and the  remaining wire were removed from the pocket and the pocket  was copiously  irrigated using 1% kanamycin solution.  The leads were then attached to the  pacing generator, carefully identifying each and placing it in the  appropriate receptacle.  Each lead was tightened into place and tested for  security.  The leads were then wound beneath the pace-shock generator and  the generator was placed in the pocket.   We then undertook defibrillation threshold testing.  The patient received a  total of 10 mg of midazolam and 200 mcg of fentanyl for adequate sedation.  Ventricular fibrillation was then induced by shock-on-T method.  Ventricular  fibrillation was promptly induced.  It was detected and terminated with a 20-  joule shock, 37 ohms delivered successfully.  There was no dropout.  The  detection and charge time was 7.2 seconds.   After a period of 5 minutes, we again  induced ventricular fibrillation by  the shock-on-T method.  Ventricular fibrillation was promptly detected and  the device charged and delivered a 20-joule shock at 37 ohms with successful  termination and no dropouts.   The pocket was then closed using 2-0 Vicryl in a running fashion, 2 layers  for the subcutaneous layer.  The skin was approximated using 4-0 Vicryl in a  running subcuticular fashion.  Steri-Strips and a sterile dressing were  applied, and the patient was transported to the recovery area in stable  condition.   EQUIPMENT DATA:  The pace-shock generator is a Medtronic Entrust, model  number D154VRC, serial number [id number].  The ventricular pace-shock lead  is a Medtronic model number [id number], serial number [id number].   PACING DATA:  The ventricular lead detected a 7.9-mV R wave.  The pacing  threshold was 0.9 volts at 0.5-millisecond pulse width.  The impedance was  663 ohms, resulting in a current at capture threshold of 1.5 mA.      Francisca December, M.D.  Electronically Signed     JHE/MEDQ  D:  06/20/2005  T:  06/21/2005  Job:  [id number]

## 2010-10-12 NOTE — H&P (Signed)
[name] Phillips, [name] Phillips NO.:  [id number]   MEDICAL RECORD NO.:  [id number]          PATIENT TYPE:  INP   LOCATION:  1832                         FACILITY:  [facility]   PHYSICIAN:  Ulyses Amor, MD DATE OF BIRTH:  01-18-1956   DATE OF ADMISSION:  06/11/2005  DATE OF DISCHARGE:                                HISTORY & PHYSICAL   REASON FOR ADMISSION:  [name] Phillips is a 55 year old black male who is  admitted to [facility]  because of respiratory failure due to acute  pulmonary edema.   HISTORY OF PRESENT ILLNESS:  The patient has a history of a severe dilated  non-ischemic cardiomyopathy.  His last hospitalization was in January 2005.  At that time he underwent cardiac catheterization.  This demonstrated no  coronary artery disease.  His ejection fraction was 20%.  Since that time he  has been treated medically.  It was felt that his severe left ventricular  dysfunction was due to a long history of untreated hypertension.   According to his brother, he had been compliant with his medication since  that time.  He is also known to have a history of nonsustained ventricular  tachycardia.   The patient is unable to provide a history due to his severe dyspnea.  His  brother states that the patient has experienced a one-week history of  progressive dyspnea.  There has been no fever, chills, or sputum production.  There has been a non-productive cough.  Tonight his dyspnea became  particularly severe and EMS was summoned.  He has been intubated and placed  on a ventilator.  His blood pressures have been high, though the first dose  of sedation did drop his blood pressure.  The patient's brother states the  patient did not report any chest pain.   The patient has no other medical problems.   MEDICATIONS:  Coreg, benazepril, spironolactone, and trazodone.   ALLERGIES:  PENICILLIN and CODEINE.   PAST SURGICAL HISTORY:  None.   FAMILY HISTORY:  The  patient's father reportedly had cardiac problems.   SOCIAL HISTORY:  The patient lives with his brother.  He had smoked  cigarettes until approximately two years ago.  There was no significant  alcohol use at the time.   REVIEW OF SYSTEMS:  Review of systems could not be obtained as the patient  was intubated.   PHYSICAL EXAMINATION:  VITAL SIGNS:  Blood pressure 154/127.  Heart rate  118.  GENERAL:  The patient was an intubated and sedated middle-aged black man.  HEENT:  Normal.  NECK:  Without thyromegaly or adenopathy.  Carotid pulses were palpable  bilaterally and without bruits.  CARDIAC:  Normal S1 and S2.  No gallop, murmur, rub, or click.  Cardiac  rhythm is regular.  LUNGS:  Basilar crackles (physical examination performed after the patient  had received furosemide).  ABDOMEN:  Soft, nontender.  There was no mass, hepatosplenomegaly, bruit,  distention, rebound, guarding, or rigidity.  Bowel sounds were normal.  RECTAL/GENITAL:  Not performed as they were not pertinent to the reason for  acute care hospitalization.  EXTREMITIES:  Without edema, deviation, or deformity.  Radial and dorsalis  pedis pulses were palpable bilaterally.  NEUROLOGIC:  No focal neurologic  findings (though the patient was sedated).   LABORATORY DATA:  The chest radiograph, according to the radiologist,  demonstrated pulmonary edema.  The electrocardiogram revealed sinus  tachycardia, left ventricular hypertrophy with secondary repolarization  abnormalities, and bi-atrial enlargement.  The possibility of a prior septal  myocardial infarction could not be excluded.  White count was 6, hemoglobin  14, hematocrit 41.7.  Initial blood gas revealed a PH of 7.25, PO2 92, PCO2  53, saturation 96%.  Potassium 4.6, BUN 19, creatinine 1.4.  The initial set  of cardiac markers revealed a myoglobin of 91.3, CK-MB 1.5, troponin 0.06.  BNP was 1180.  The remaining studies were pending at the time of this   dictation.   IMPRESSION:  1.  Respiratory failure.  Acute pulmonary edema.  2.  Severe dilated non-ischemic cardiomyopathy. Ejection fraction 20%,      possibly due to prior history of untreated hypertension.  3.  Hypertension.  4.  Non-sustained ventricular tachycardia.   PLAN:  1.  Coronary care unit.  2.  Serial cardiac enzymes.  3.  Ventilation.  4.  Diuresis.  5.  Daily weights, input and output.  6.  Blood pressure management.  7.  Sedation protocol.  8.  Further measures per Dr. Mayford Knife.      Ulyses Amor, MD  Electronically Signed     MSC/MEDQ  D:  06/12/2005  T:  06/12/2005  Job:  [id number]   cc:   Armanda Magic, M.D.  Fax: [phone]

## 2010-10-12 NOTE — Consult Note (Signed)
NAMEERINEO, MOHL                ACCOUNT NO.:  [id number]   MEDICAL RECORD NO.:  [id number]          PATIENT TYPE:  INP   LOCATION:  3739                         FACILITY:  [facility]   PHYSICIAN:  Doylene Canning. Ladona Ridgel, MD    DATE OF BIRTH:  July 19, 1955   DATE OF CONSULTATION:  09/02/2006  DATE OF DISCHARGE:                                 CONSULTATION   REFERRING PHYSICIAN:  Francisca December, M.D.   INDICATION FOR CONSULTATION:  Evaluation of patient with multiple ICD  shocks.   HISTORY OF PRESENT ILLNESS:  The patient is a very pleasant 55 year old  with a nonischemic cardiomyopathy and congestive heart failure who is  status post ICD implantation over a year ago by Dr. Amil Amen.  At that  time he had a Medtronic Maximo placed.  The patient has been stable and  had never received an ICD discharge.  He was in his usual state of  health when he was actually mowing the grass yesterday and received  multiple ICD shocks.  He was taken to the emergency department for  additional evaluation.  Initial interrogation of his defibrillator  demonstrated an episode of tachycardia which was very similar to his  baseline QRS morphology, though not identical, at rates of were  initially around 160 beats a minute and then at other rates up to over  170 beats per minute.  Following multiple ICD shocks, they actually  accelerated further so that  his final tachycardia is up around 200  beats per minute.  His device the ultimately timed out, and he presented  to the emergency room for additional evaluation.  The patient states  that he was mowing grass and had stopped taking his beta blocker several  days before.  Initially  while he was mowing grass, he was riding the  lawn mower, but at the time of his ICD shocks, he was actually pushing  the lawn mower and was noted be quite sweaty.  Still, he had no  palpitations and no warning that his heart was racing.  He denies any  worsening heart failure symptoms in  the last several weeks. He denies  dietary noncompliance.   His additional past medical history is notable for longstanding  hypertension.  He has a history of alcohol abuse.  He is a history of  left pleural effusion status post thoracentesis in the past.  He has  mild renal insufficiency.   He has a history of allergy to PENICILLIN and CODEINE.   MEDICATIONS:  1. Coreg 12.5 twice daily (presently not taking).  2. Hydralazine 25 3 times a day.  3. He had previously been on Altace and Isordil but was not on these      at the time of his admission.  4. Also on aspirin.   SOCIAL HISTORY:  The patient has longstanding history of alcohol abuse,  though he has recently cut down. He smokes cigarettes but now less than  one-half  pack per day.  He denies illicit drug use.   FAMILY HISTORY:  Notable for mother being deceased after complications  of  diabetes and heart disease and father who died suddenly with known  hypertension prior to this.   REVIEW OF SYSTEMS:  Negative except as noted in the HPI.  He has not had  syncope.  He denies palpitations.  He does have occasional dyspepsia.   PHYSICAL EXAMINATION:  GENERAL:  Notable for a pleasant well-appearing  middle-aged man in no acute distress.  VITAL SIGNS:  The blood pressure was 140/95.  The pulse was 85 and  regular. Respirations were 18, temperature 98.  HEENT:  Normocephalic and atraumatic.  Pupils equal and round.  Oropharynx is moist.  Sclerae anicteric.  NECK:  Revealed no jugular distension.  There was no thyromegaly.  Trachea is midline.  Carotid 2+ and symmetric.  LUNGS:  Clear bilaterally to auscultation.  No wheezes, rales or rhonchi  present.  No increased work of breathing.  CARDIOVASCULAR:  Exam revealed regular rhythm with normal S1-S2.  There  was soft S4 gallop present.  I did not appreciate any murmurs.  PMI was  enlarged and laterally displaced.  ABDOMINAL:  Soft, nontender, nondistended.  There was no  organomegaly.  Bowel sounds present.  No rebound or guarding.  EXTREMITIES:  Demonstrate no cyanosis, clubbing or edema.  Pulses 2+ and  symmetric.  NEUROLOGIC:  Alert and oriented x3.  Cranial nerves intact.  Strength  5/5 and symmetric.   The EKG demonstrates sinus rhythm.   Interrogation his defibrillator has previously been described.  The  patient appears to have an episode of tachycardia initially at 175 beats  per minute.  Prior tachycardia, however, in his monitor only zone was in  the 160-beat-per-minute range.  This tachycardia was categorized as VT  because of near match on the electrogram morphology.  During  tachycardia, there appeared to be a far field P wave recorded, although  this cannot be certain.  Of note, the patient's antitachycardic pacing  did not terminate the tachycardia, though did slow slightly and then  resume.  Multiple ICD shocks did not terminate the tachycardia as well.  The tachycardia did, however, slow with ICD shocks.  Following ICD  shocks, the patient's R waves were decreased, and his S wave was slurred  more promptly.  Clinical significance of this is unclear.   IMPRESSION:  1. Recurrent implantable cardioverter-defibrillator shocks with      mechanism of a shock being unclear.  2. Nonischemic cardiomyopathy.  3. Congestive heart failure.  4. Medical noncompliance.   DISCUSSION:  I have reviewed the EKG strips pulled off his ICD.  His  tachycardia looks very much like his intrinsic QRS.  His history is that  he was mowing his grass with a rider lawn mower, and during this time  device interrogation demonstrated the same tachycardia at a slower rate  and the same QRS morphology.  He was asymptomatic while he was doing  this.  He was also asymptomatic, other than been sweating from exertion,  prior to receiving his multiple ICD shocks, and this occurred while transitioning from the riding lawn mower to pushing the lawn mower to  mow his  grass.  My interpretation of this history is that the most  likely scenario was that he had sinus tachycardia which increased in  rate, ultimately getting into the VT zone at 171 beats per minute  resulting in multiple ATPs followed additional ICD shocks.  His shocks  did not terminate his tachycardia.  The above findings are also  supported by the fact that he had stopped  Coreg for several days.  Ultimately  whether he had VT or SVT will never been known for certain.   My recommendations will be to start the patient on Coreg, that we  increase his VT detection zone from 171 to 176, and that we encourage  medical compliance in the future.  A final option would be to have him  undergo exercise treadmill testing while he is back on his Coreg to make  sure that his sinus rate and AV conduction to not allow him to conduct  as fast as he appeared to be conducting when he received his multiple  ICD shocks.      Doylene Canning. Ladona Ridgel, MD  Electronically Signed     GWT/MEDQ  D:  09/02/2006  T:  09/02/2006  Job:  [id number]   cc:   Francisca December, M.D.

## 2010-12-01 ENCOUNTER — Emergency Department (HOSPITAL_COMMUNITY): Payer: Medicare Other

## 2010-12-01 ENCOUNTER — Inpatient Hospital Stay (HOSPITAL_COMMUNITY)
Admission: EM | Admit: 2010-12-01 | Discharge: 2010-12-11 | DRG: 897 | Disposition: A | Discharge status: Home / Self Care | Payer: Medicare Other | Attending: Internal Medicine | Admitting: Internal Medicine

## 2010-12-01 DIAGNOSIS — I10 Essential (primary) hypertension: Secondary | ICD-10-CM | POA: Diagnosis present

## 2010-12-01 DIAGNOSIS — Z91199 Patient's noncompliance with other medical treatment and regimen due to unspecified reason: Secondary | ICD-10-CM

## 2010-12-01 DIAGNOSIS — D61818 Other pancytopenia: Secondary | ICD-10-CM | POA: Diagnosis present

## 2010-12-01 DIAGNOSIS — E785 Hyperlipidemia, unspecified: Secondary | ICD-10-CM | POA: Diagnosis present

## 2010-12-01 DIAGNOSIS — F10931 Alcohol use, unspecified with withdrawal delirium: Principal | ICD-10-CM | POA: Diagnosis present

## 2010-12-01 DIAGNOSIS — F10231 Alcohol dependence with withdrawal delirium: Principal | ICD-10-CM | POA: Diagnosis present

## 2010-12-01 DIAGNOSIS — I428 Other cardiomyopathies: Secondary | ICD-10-CM | POA: Diagnosis present

## 2010-12-01 DIAGNOSIS — E876 Hypokalemia: Secondary | ICD-10-CM | POA: Diagnosis present

## 2010-12-01 DIAGNOSIS — Y93H9 Activity, other involving exterior property and land maintenance, building and construction: Secondary | ICD-10-CM

## 2010-12-01 DIAGNOSIS — Z9119 Patient's noncompliance with other medical treatment and regimen: Secondary | ICD-10-CM

## 2010-12-01 DIAGNOSIS — F1020 Alcohol dependence, uncomplicated: Secondary | ICD-10-CM | POA: Diagnosis present

## 2010-12-01 DIAGNOSIS — R7402 Elevation of levels of lactic acid dehydrogenase (LDH): Secondary | ICD-10-CM | POA: Diagnosis present

## 2010-12-01 DIAGNOSIS — G4700 Insomnia, unspecified: Secondary | ICD-10-CM | POA: Diagnosis present

## 2010-12-01 DIAGNOSIS — R7401 Elevation of levels of liver transaminase levels: Secondary | ICD-10-CM | POA: Diagnosis present

## 2010-12-01 DIAGNOSIS — Z8673 Personal history of transient ischemic attack (TIA), and cerebral infarction without residual deficits: Secondary | ICD-10-CM

## 2010-12-01 DIAGNOSIS — I509 Heart failure, unspecified: Secondary | ICD-10-CM | POA: Diagnosis present

## 2010-12-01 DIAGNOSIS — Z95810 Presence of automatic (implantable) cardiac defibrillator: Secondary | ICD-10-CM

## 2010-12-01 DIAGNOSIS — Y92009 Unspecified place in unspecified non-institutional (private) residence as the place of occurrence of the external cause: Secondary | ICD-10-CM

## 2010-12-01 DIAGNOSIS — S0180XA Unspecified open wound of other part of head, initial encounter: Secondary | ICD-10-CM | POA: Diagnosis present

## 2010-12-01 DIAGNOSIS — R296 Repeated falls: Secondary | ICD-10-CM | POA: Diagnosis present

## 2010-12-01 HISTORY — DX: Essential (primary) hypertension: I10

## 2010-12-01 HISTORY — DX: Cerebral infarction, unspecified: I63.9

## 2010-12-01 LAB — COMPREHENSIVE METABOLIC PANEL
ALT: 97 U/L — ABNORMAL HIGH (ref 0–53)
AST: 115 U/L — ABNORMAL HIGH (ref 0–37)
Albumin: 4.2 g/dL (ref 3.5–5.2)
Alkaline Phosphatase: 123 U/L — ABNORMAL HIGH (ref 39–117)
BUN: 6 mg/dL (ref 6–23)
CO2: 27 mEq/L (ref 19–32)
Calcium: 9.3 mg/dL (ref 8.4–10.5)
Chloride: 102 mEq/L (ref 96–112)
Creatinine, Ser: 1.03 mg/dL (ref 0.50–1.35)
GFR calc Af Amer: >60 mL/min (ref >60)
GFR calc non Af Amer: >60 mL/min (ref >60)
Glucose, Bld: 108 mg/dL — ABNORMAL HIGH (ref 70–99)
Potassium: 3.7 mEq/L (ref 3.5–5.1)
Sodium: 143 mEq/L (ref 135–145)
Total Bilirubin: 0.3 mg/dL (ref 0.3–1.2)
Total Protein: 8 g/dL (ref 6.0–8.3)

## 2010-12-01 LAB — CBC
HCT: 36.3 % — ABNORMAL LOW (ref 39.0–52.0)
Hemoglobin: 12.6 g/dL — ABNORMAL LOW (ref 13.0–17.0)
MCH: 32 pg (ref 26.0–34.0)
MCHC: 34.7 g/dL (ref 30.0–36.0)
MCV: 92.1 fL (ref 78.0–100.0)
Platelets: 138 10*3/uL — ABNORMAL LOW (ref 150–400)
RBC: 3.94 MIL/uL — ABNORMAL LOW (ref 4.22–5.81)
RDW: 15.3 % (ref 11.5–15.5)
WBC: 4 10*3/uL (ref 4.0–10.5)

## 2010-12-01 LAB — DIFFERENTIAL
Basophils Absolute: 0 10*3/uL (ref 0.0–0.1)
Basophils Relative: 1 % (ref 0–1)
Eosinophils Absolute: 0.2 10*3/uL (ref 0.0–0.7)
Eosinophils Relative: 5 % (ref 0–5)
Lymphocytes Relative: 60 % — ABNORMAL HIGH (ref 12–46)
Lymphs Abs: 2.4 10*3/uL (ref 0.7–4.0)
Monocytes Absolute: 0.3 10*3/uL (ref 0.1–1.0)
Monocytes Relative: 8 % (ref 3–12)
Neutro Abs: 1.1 10*3/uL — ABNORMAL LOW (ref 1.7–7.7)
Neutrophils Relative %: 26 % — ABNORMAL LOW (ref 43–77)

## 2010-12-01 LAB — ETHANOL
Alcohol, Ethyl (B): 369 mg/dL — ABNORMAL HIGH (ref 0–11)
Alcohol, Ethyl (B): 280 mg/dL — ABNORMAL HIGH (ref 0–11)

## 2010-12-02 LAB — CK TOTAL AND CKMB (NOT AT ARMC)
CK, MB: 2 ng/mL (ref 0.3–4.0)
Relative Index: 1.8 (ref 0.0–2.5)
Total CK: 110 U/L (ref 7–232)

## 2010-12-02 LAB — TROPONIN I: Troponin I: <0.3 ng/mL (ref <0.30)

## 2010-12-02 LAB — RAPID URINE DRUG SCREEN, HOSP PERFORMED
Amphetamines: NOT DETECTED
Barbiturates: NOT DETECTED
Benzodiazepines: NOT DETECTED
Cocaine: NOT DETECTED
Opiates: NOT DETECTED
Tetrahydrocannabinol: NOT DETECTED

## 2010-12-02 LAB — ETHANOL: Alcohol, Ethyl (B): <11 mg/dL (ref 0–11)

## 2010-12-03 ENCOUNTER — Encounter (HOSPITAL_COMMUNITY): Payer: Self-pay | Admitting: Radiology

## 2010-12-03 ENCOUNTER — Inpatient Hospital Stay (HOSPITAL_COMMUNITY): Payer: Medicare Other

## 2010-12-03 LAB — COMPREHENSIVE METABOLIC PANEL
ALT: 71 U/L — ABNORMAL HIGH (ref 0–53)
AST: 71 U/L — ABNORMAL HIGH (ref 0–37)
Albumin: 3.6 g/dL (ref 3.5–5.2)
Alkaline Phosphatase: 108 U/L (ref 39–117)
BUN: 10 mg/dL (ref 6–23)
CO2: 30 mEq/L (ref 19–32)
Calcium: 9.2 mg/dL (ref 8.4–10.5)
Chloride: 97 mEq/L (ref 96–112)
Creatinine, Ser: 0.82 mg/dL (ref 0.50–1.35)
GFR calc Af Amer: >60 mL/min (ref >60)
GFR calc non Af Amer: >60 mL/min (ref >60)
Glucose, Bld: 106 mg/dL — ABNORMAL HIGH (ref 70–99)
Potassium: 3.3 mEq/L — ABNORMAL LOW (ref 3.5–5.1)
Sodium: 133 mEq/L — ABNORMAL LOW (ref 135–145)
Total Bilirubin: 0.5 mg/dL (ref 0.3–1.2)
Total Protein: 6.8 g/dL (ref 6.0–8.3)

## 2010-12-03 LAB — CARDIAC PANEL(CRET KIN+CKTOT+MB+TROPI)
CK, MB: 2 ng/mL (ref 0.3–4.0)
CK, MB: 2.2 ng/mL (ref 0.3–4.0)
Relative Index: INVALID (ref 0.0–2.5)
Relative Index: INVALID (ref 0.0–2.5)
Total CK: 93 U/L (ref 7–232)
Total CK: 88 U/L (ref 7–232)
Troponin I: <0.3 ng/mL (ref <0.30)
Troponin I: <0.3 ng/mL (ref <0.30)

## 2010-12-03 LAB — IRON AND TIBC
Iron: 106 ug/dL (ref 42–135)
Saturation Ratios: 38 % (ref 20–55)
TIBC: 281 ug/dL (ref 215–435)
UIBC: 175 ug/dL

## 2010-12-03 LAB — CBC
HCT: 32.6 % — ABNORMAL LOW (ref 39.0–52.0)
Hemoglobin: 10.9 g/dL — ABNORMAL LOW (ref 13.0–17.0)
MCH: 31 pg (ref 26.0–34.0)
MCHC: 33.4 g/dL (ref 30.0–36.0)
MCV: 92.6 fL (ref 78.0–100.0)
Platelets: 119 10*3/uL — ABNORMAL LOW (ref 150–400)
RBC: 3.52 MIL/uL — ABNORMAL LOW (ref 4.22–5.81)
RDW: 14.8 % (ref 11.5–15.5)
WBC: 3.3 10*3/uL — ABNORMAL LOW (ref 4.0–10.5)

## 2010-12-03 LAB — LACTATE DEHYDROGENASE: LDH: 207 U/L (ref 94–250)

## 2010-12-03 LAB — MAGNESIUM: Magnesium: 1.6 mg/dL (ref 1.5–2.5)

## 2010-12-03 LAB — DIFFERENTIAL
Basophils Absolute: 0 10*3/uL (ref 0.0–0.1)
Basophils Relative: 0 % (ref 0–1)
Eosinophils Absolute: 0.1 10*3/uL (ref 0.0–0.7)
Eosinophils Relative: 4 % (ref 0–5)
Lymphocytes Relative: 40 % (ref 12–46)
Lymphs Abs: 1.3 10*3/uL (ref 0.7–4.0)
Monocytes Absolute: 0.4 10*3/uL (ref 0.1–1.0)
Monocytes Relative: 12 % (ref 3–12)
Neutro Abs: 1.5 10*3/uL — ABNORMAL LOW (ref 1.7–7.7)
Neutrophils Relative %: 45 % (ref 43–77)

## 2010-12-03 LAB — VITAMIN B12: Vitamin B-12: 552 pg/mL (ref 211–911)

## 2010-12-03 LAB — TECHNOLOGIST SMEAR REVIEW

## 2010-12-03 LAB — GAMMA GT: GGT: 97 U/L — ABNORMAL HIGH (ref 7–51)

## 2010-12-03 LAB — FERRITIN: Ferritin: 433 ng/mL — ABNORMAL HIGH (ref 22–322)

## 2010-12-03 LAB — FOLATE: Folate: >20 ng/mL

## 2010-12-03 MED ORDER — IOHEXOL 350 MG/ML SOLN
200.0000 mL | Freq: Once | INTRAVENOUS | Status: AC | PRN
  Administered 2010-12-03: 200 mL via INTRAVENOUS

## 2010-12-03 NOTE — H&P (Signed)
[name], [name] NO.:  [id number]  MEDICAL RECORD NO.:  [id number]  LOCATION:  WLED                         FACILITY:  [facility]  PHYSICIAN:  Talmage Nap, MD  DATE OF BIRTH:  05/16/1956  DATE OF ADMISSION:  12/01/2010 DATE OF DISCHARGE:                             HISTORY & PHYSICAL   PRIMARY CARDIOLOGISTS: 1. Osvaldo Shipper. Spruill, M.D. 2. Eduardo Osier. Sharyn Lull, M.D.  History obtainable from patient (patient not a very good historian).  CHIEF COMPLAINT:  "I had a fall in my backyard today."  HISTORY OF PRESENT ILLNESS:  Patient is a 55 year old African American male with history of chronic alcoholism; congestive heart failure, not otherwise specified; cardiomyopathy, status post defibrillator presenting to the Emergency Room with history of fall at home and subsequently brought to the emergency room.  Patient claimed that he was in his backyard doing some yard work and suddenly fell.  He denied any premonitory symptoms prior to the onset of fall.  He denied any history of chest pain.  Denied any history of shortness of breath.  He denied any fever, chills or rigor.  He claimed that he tripped and subsequently fell and sustained laceration on the forehead and thereafter brought to the emergency room to be evaluated.  PAST MEDICAL HISTORY:  Positive for: 1. Congestive heart failure (not otherwise specified). 2. Hypertension. 3. Cardiomyopathy, status post defibrillator. 4. Chronic alcoholism. 5. Arthritis. 6. Hyperlipidemia. 7. History of CVA, presently no neuro deficit. 8. Noncompliant with meds.  PAST SURGICAL HISTORY:  Cardiomyopathy, status post defibrillator.  PREADMISSION MEDICATIONS:  Include: 1. Carvedilol 12.5 mg p.o. b.i.d. 2. Plavix 75 mg p.o. daily. 3. Altace 10 mg p.o. daily. 4. Crestor 10 mg p.o. daily. 5. Aspirin 325 mg p.o. daily. 6. Folic acid 1 mg p.o. daily.  ALLERGIES: 1. CODEINE. 2. PENICILLIN.  SOCIAL HISTORY:  Patient  drinks on a regular basis.  Could not quantify how much alcohol he takes.  Denies any history of street drug use. Smokes unquantifiable sticks of cigarettes a day and is presently unemployed.  FAMILY HISTORY:  Said to positive for hypertension.  REVIEW OF SYSTEMS:  Patient denies any history of headaches apart from the small pain on the forehead where he sustained a laceration.  Denies any blurred vision.  No nausea or vomiting.  No fever.  No chills.  No rigor.  Complained of mild precordial discomfort.  No associated shortness of breath.  Denies any PND or orthopnea.  He denies any cough. No abdominal discomfort.  No diarrhea or hematochezia.  No dysuria or hematuria.  No swelling of the lower extremities.  No intolerance to heat or cold.  No neuropsychiatric disorder.  PHYSICAL EXAMINATION:  GENERAL:  Middle-aged man looking slightly confused, dehydrated, not in any respiratory distress, has  laceration on the right forehead. PRESENT VITAL SIGNS:  Blood pressure is 187/121, pulse is 54, respiratory rate 20, temperature is 98.1.  Pulse was repeated,  rate was 62. HEENT:  Mild pallor, but pupils are reactive to light and extraocular muscles are intact. NECK:  No jugular venous distention.  No carotid bruit.  No lymphadenopathy. CHEST:  Showed defibrillator in situ, otherwise clear to  auscultation. CARDIOVASCULAR:  Heart sounds are one and two. ABDOMEN:  Soft, nontender.  Liver, spleen, kidney not palpable.  Bowel sounds are positive. EXTREMITIES:  No pedal edema. NEUROLOGIC EXAMINATION:  Did not show any lateralizing sign. Asterixis. NEUROPSYCHIATRIC EVALUATION:  Unremarkable. SKIN:  Showed decreased turgor.  LABORATORY DATA:  Urine drug screen negative.  Alcohol level is 280. Repeat alcohol level was 338.  Chemistry shows sodium of 143, potassium of 3.7, chloride of 102 with the bicarb of 27, glucose is 108, BUN is 6, creatinine is 1.03.  LFTs show total protein of 8.0,  albumin is 4.2, AST is 115, ALT is 97, alk phos 123.  Hematological indices showed WBC of 4.0, hemoglobin of 12.6, hematocrit of 36.3, MCV of 92.1 with the platelet count of 138,000, neutrophils 26% and absolute neutrophil count is 1.1.  DIAGNOSTIC STUDIES:  Imaging studies done on patient include CT of the head without contrast, which showed no acute intracranial finding, evolution of left parietal-cortical infarction, extensive white matter hypodensity most consistent with small vessel ischemia.  IMPRESSION: 1. Fall with laceration on the forehead. 2. Chronic alcoholism. 3. Hypertension, uncontrolled. 4. Congestive heart failure (not otherwise specified).  Patient not     decompensating. 5. Cardiomyopathy, status post defibrillator. 6. Hyperlipidemia. 7. History of CVA, presently no neuro deficit. 8. Noncompliant with medications. 9. Abnormal LFT most likely secondary to chronic EtOH use. 10.Thrombocytopenia most likely secondary to EtOH use. 11.Neutropenia most likely secondary to EtOH use.  PLAN:  Plan is to admit patient to telemetry.  Patient will be given banana bag with half-normal saline with 1 amp of multivitamin, folic acid 1 mg, thiamine 100 mg to go at rate of 65 mL an hour x1 liter and then he will continue with thiamine 100 mg p.o. daily as well as folic acid 1 mg p.o. daily.  Blood pressure will be controlled with clonidine 0.1 mg p.o. t.i.d. stat dose and then Altace 10 mg p.o. daily, start. He will also be on carvedilol 12.5 mg p.o. b.i.d.  Patient will be restarted on Plavix 75 mg p.o. daily, Crestor 10 mg daily and also be given aspirin 325 mg p.o. daily.  He will also be on CIWA protocol.  GI prophylaxis will be done with Protonix 40 mg p.o. daily and DVT prophylaxis with TED stockings because of the thrombocytopenia.  Further workup to be done on this patient will include cardiac enzymes q.6h. x3. Hepatitis panel, which include A, B and C because of the  abnormal LFT. GGT level will be done.  CBC, CMP and magnesium will be repeated in a.m. Patient will be followed and evaluated on daily basis.     Talmage Nap, MD     CN/MEDQ  D:  12/02/2010  T:  12/02/2010  Job:  [id number]  Electronically Signed by Talmage Nap  on 12/03/2010 12:33:32 AM

## 2010-12-04 LAB — COMPREHENSIVE METABOLIC PANEL
ALT: 62 U/L — ABNORMAL HIGH (ref 0–53)
AST: 60 U/L — ABNORMAL HIGH (ref 0–37)
Albumin: 3.7 g/dL (ref 3.5–5.2)
Alkaline Phosphatase: 116 U/L (ref 39–117)
BUN: 7 mg/dL (ref 6–23)
CO2: 28 mEq/L (ref 19–32)
Calcium: 9.9 mg/dL (ref 8.4–10.5)
Chloride: 98 mEq/L (ref 96–112)
Creatinine, Ser: 0.77 mg/dL (ref 0.50–1.35)
GFR calc Af Amer: >60 mL/min (ref >60)
GFR calc non Af Amer: >60 mL/min (ref >60)
Glucose, Bld: 98 mg/dL (ref 70–99)
Potassium: 4 mEq/L (ref 3.5–5.1)
Sodium: 134 mEq/L — ABNORMAL LOW (ref 135–145)
Total Bilirubin: 0.5 mg/dL (ref 0.3–1.2)
Total Protein: 7.1 g/dL (ref 6.0–8.3)

## 2010-12-04 LAB — DIFFERENTIAL
Basophils Absolute: 0 10*3/uL (ref 0.0–0.1)
Basophils Relative: 1 % (ref 0–1)
Eosinophils Absolute: 0.3 10*3/uL (ref 0.0–0.7)
Eosinophils Relative: 5 % (ref 0–5)
Lymphocytes Relative: 31 % (ref 12–46)
Lymphs Abs: 1.9 10*3/uL (ref 0.7–4.0)
Monocytes Absolute: 1.4 10*3/uL — ABNORMAL HIGH (ref 0.1–1.0)
Monocytes Relative: 23 % — ABNORMAL HIGH (ref 3–12)
Neutro Abs: 2.4 10*3/uL (ref 1.7–7.7)
Neutrophils Relative %: 40 % — ABNORMAL LOW (ref 43–77)

## 2010-12-04 LAB — LIPID PANEL
Cholesterol: 184 mg/dL (ref 0–200)
HDL: 66 mg/dL (ref >39)
LDL Cholesterol: 99 mg/dL (ref 0–99)
Total CHOL/HDL Ratio: 2.8 RATIO
Triglycerides: 93 mg/dL (ref <150)
VLDL: 19 mg/dL (ref 0–40)

## 2010-12-04 LAB — CBC
HCT: 36.6 % — ABNORMAL LOW (ref 39.0–52.0)
Hemoglobin: 12.2 g/dL — ABNORMAL LOW (ref 13.0–17.0)
MCH: 30.8 pg (ref 26.0–34.0)
MCHC: 33.3 g/dL (ref 30.0–36.0)
MCV: 92.4 fL (ref 78.0–100.0)
Platelets: 111 10*3/uL — ABNORMAL LOW (ref 150–400)
RBC: 3.96 MIL/uL — ABNORMAL LOW (ref 4.22–5.81)
RDW: 14.6 % (ref 11.5–15.5)
WBC: 5.9 10*3/uL (ref 4.0–10.5)

## 2010-12-04 LAB — HEPATITIS PANEL, ACUTE
HCV Ab: NEGATIVE
Hep A IgM: NEGATIVE
Hep B C IgM: NEGATIVE
Hepatitis B Surface Ag: NEGATIVE

## 2010-12-04 LAB — HAPTOGLOBIN: Haptoglobin: 171 mg/dL (ref 30–200)

## 2010-12-05 LAB — CBC
HCT: 35.9 % — ABNORMAL LOW (ref 39.0–52.0)
Hemoglobin: 12 g/dL — ABNORMAL LOW (ref 13.0–17.0)
MCH: 31 pg (ref 26.0–34.0)
MCHC: 33.4 g/dL (ref 30.0–36.0)
MCV: 92.8 fL (ref 78.0–100.0)
Platelets: 105 10*3/uL — ABNORMAL LOW (ref 150–400)
RBC: 3.87 MIL/uL — ABNORMAL LOW (ref 4.22–5.81)
RDW: 14.6 % (ref 11.5–15.5)
WBC: 4.6 10*3/uL (ref 4.0–10.5)

## 2010-12-06 DIAGNOSIS — F1020 Alcohol dependence, uncomplicated: Secondary | ICD-10-CM

## 2010-12-06 LAB — COMPREHENSIVE METABOLIC PANEL
ALT: 68 U/L — ABNORMAL HIGH (ref 0–53)
AST: 67 U/L — ABNORMAL HIGH (ref 0–37)
Albumin: 3.4 g/dL — ABNORMAL LOW (ref 3.5–5.2)
Alkaline Phosphatase: 107 U/L (ref 39–117)
BUN: 10 mg/dL (ref 6–23)
CO2: 28 mEq/L (ref 19–32)
Calcium: 10.5 mg/dL (ref 8.4–10.5)
Chloride: 99 mEq/L (ref 96–112)
Creatinine, Ser: 0.98 mg/dL (ref 0.50–1.35)
GFR calc Af Amer: >60 mL/min (ref >60)
GFR calc non Af Amer: >60 mL/min (ref >60)
Glucose, Bld: 103 mg/dL — ABNORMAL HIGH (ref 70–99)
Potassium: 3.6 mEq/L (ref 3.5–5.1)
Sodium: 138 mEq/L (ref 135–145)
Total Bilirubin: 0.5 mg/dL (ref 0.3–1.2)
Total Protein: 7.2 g/dL (ref 6.0–8.3)

## 2010-12-06 LAB — MAGNESIUM: Magnesium: 1.8 mg/dL (ref 1.5–2.5)

## 2010-12-06 NOTE — Consult Note (Signed)
[name] Phillips, POINT                ACCOUNT NO.:  [id number]  MEDICAL RECORD NO.:  [id number]  LOCATION:  1504                         FACILITY:  [facility]  PHYSICIAN:  Eulogio Ditch, MD DATE OF BIRTH:  1956/03/11  DATE OF CONSULTATION:  12/05/2010 DATE OF DISCHARGE:                                CONSULTATION   REASON FOR CONSULTATION:  Alcohol abuse.  HISTORY OF PRESENT ILLNESS:  A 55 year old African American male with history of chronic alcohol dependence along with multiple medical issues.  The patient was admitted as he fell in his backyard.  The patient got laceration on the forehead after he fell.  At the time of admission, the patient's alcohol level was 369, but the patient denied regularly taking alcohol.  He denied abusing any other drugs.  His UDS was negative.  Currently, he is on Ativan detox protocol.  PAST MEDICAL HISTORY: 1. History of congestive heart failure. 2. Hypertension. 3. Cardiomyopathy, status post defibrillator. 4. Arthritis. 5. Hyperlipidemia. 6. History of CVA.  CURRENT PSYCH MEDICATIONS:  None.  ALLERGIES:  To CODEINE and PENICILLIN.  MENTAL STATUS EXAMINATION:  The patient at this time is confused, unable to provide logical history, unable to discuss his different options for alcohol abuse treatment.  The patient is alert, awake but not fully oriented.  Mental status is altered at this time.  DIAGNOSIS:  Axis I:  Chronic alcohol dependence. Axis II:  Deferred. Axis III:  See medical notes. Axis IV:  Chronic alcohol abuse. Axis V:  40.  RECOMMENDATIONS: 1. The patient can be continued on Ativan detox protocol. 2. I also started the patient on Librium 25 mg t.i.d. for 2 to 3 days. 3. I will follow up on this patient.     Eulogio Ditch, MD     SA/MEDQ  D:  12/05/2010  T:  12/05/2010  Job:  [id number]  Electronically Signed by Eulogio Ditch  on 12/06/2010 03:33:03 PM

## 2010-12-08 LAB — BASIC METABOLIC PANEL
BUN: 20 mg/dL (ref 6–23)
CO2: 28 mEq/L (ref 19–32)
Calcium: 10.3 mg/dL (ref 8.4–10.5)
Chloride: 100 mEq/L (ref 96–112)
Creatinine, Ser: 1.26 mg/dL (ref 0.50–1.35)
GFR calc Af Amer: >60 mL/min (ref >60)
GFR calc non Af Amer: 59 mL/min — ABNORMAL LOW (ref >60)
Glucose, Bld: 104 mg/dL — ABNORMAL HIGH (ref 70–99)
Potassium: 3.5 mEq/L (ref 3.5–5.1)
Sodium: 137 mEq/L (ref 135–145)

## 2010-12-10 LAB — BASIC METABOLIC PANEL
BUN: 14 mg/dL (ref 6–23)
CO2: 28 mEq/L (ref 19–32)
Calcium: 10.4 mg/dL (ref 8.4–10.5)
Chloride: 99 mEq/L (ref 96–112)
Creatinine, Ser: 0.96 mg/dL (ref 0.50–1.35)
GFR calc Af Amer: >60 mL/min (ref >60)
GFR calc non Af Amer: >60 mL/min (ref >60)
Glucose, Bld: 118 mg/dL — ABNORMAL HIGH (ref 70–99)
Potassium: 3.4 mEq/L — ABNORMAL LOW (ref 3.5–5.1)
Sodium: 135 mEq/L (ref 135–145)

## 2010-12-10 LAB — GLUCOSE, CAPILLARY: Glucose-Capillary: 113 mg/dL — ABNORMAL HIGH (ref 70–99)

## 2010-12-11 DIAGNOSIS — F1020 Alcohol dependence, uncomplicated: Secondary | ICD-10-CM

## 2010-12-11 LAB — BASIC METABOLIC PANEL
BUN: 12 mg/dL (ref 6–23)
CO2: 28 mEq/L (ref 19–32)
Calcium: 10.4 mg/dL (ref 8.4–10.5)
Chloride: 99 mEq/L (ref 96–112)
Creatinine, Ser: 0.96 mg/dL (ref 0.50–1.35)
GFR calc Af Amer: >60 mL/min (ref >60)
GFR calc non Af Amer: >60 mL/min (ref >60)
Glucose, Bld: 101 mg/dL — ABNORMAL HIGH (ref 70–99)
Potassium: 3.8 mEq/L (ref 3.5–5.1)
Sodium: 136 mEq/L (ref 135–145)

## 2010-12-15 NOTE — Discharge Summary (Signed)
NAMECAVALLI, PACIS                ACCOUNT NO.:  [id number]  MEDICAL RECORD NO.:  [id number]  LOCATION:  1504                         FACILITY:  [facility]  PHYSICIAN:  Kela Millin, M.D.DATE OF BIRTH:  May 23, 1956  DATE OF ADMISSION:  12/01/2010 DATE OF DISCHARGE:  12/11/2010                        DISCHARGE SUMMARY - REFERRING   DISCHARGE DIAGNOSES: 1. Chronic alcohol dependence with delirium tremens. 2. Uncontrolled hypertension. 3. History of cardiomyopathy and status post defibrillator in the     past. 4. Congestive heart failure, not otherwise specified - compensated. 5. Insomnia. 6. History of hyperlipidemia. 7. History of cerebrovascular accident. 8. History of noncompliance with medications. 9. Hyperlipidemia. 10.Arthritis.  PROCEDURES AND STUDIES: 1. CT scan of the head on November 30, 2010 - vague area of decreased     attenuation within the posterior left occipital lobe reasons     concern for acute or early subacute infarct.  No evidence of     hemorrhagic transformation.  Scattered small vessel ischemic     microangiopathy and small chronic lacuna infarct in the right     caudate. 2. Chest x-ray - no acute disease. 3. Followup CT scan of head on December 01, 2010 - no acute intracranial     findings.  Evolution of left parietal cortical infarction.     Extensive white matter hypo-densities most consistent with small     vessel ischemia. 4. Followup chest x-ray on 07/07 - no acute cardiopulmonary disease or     significant interval change. 5. CT angiogram of the neck on 07/09 - mild for age atherosclerosis     and no carotid or vertebral artery stenosis in the neck.     Intermittent tortuosity of vessels, including the left vertebral     artery which enters the left C4 neural foramen.  Bulky cervical     vertebral body anterior osteophytes resulting in narrowing of the     hypopharynx.  Negative for acute intracranial findings. 6. CT angiogram of the head - incidental  small infundibula of distal     ICA.  Negative intracranial CTA.  Stable CT appearance of brain     with chronic left parietooccipital infarct and age advanced     nonspecific white matter changes.  CONSULTATIONS:  Psychiatry - Dr. Rogers Blocker.  BRIEF HISTORY:  The patient is a 55 year old black male with the above- listed medical problems, who presented status post fall at home.  The patient reported that he had been out in his backyard doing some work and suddenly fell.  He denied any premonitory symptoms prior to the fall.  He denied chest pain, shortness of breath, fevers, or chills.  He stated that he tripped and fell, and sustained a laceration on his forehead and thereafter was brought to the ED for further evaluation and management.  HOSPITAL COURSE: 1. Chronic alcohol dependence with delirium tremens - the patient     admitted following his admission to the hospital that he had been     drinking prior to the fall and tripped over some bushes.  He was     started on an Ativan detox protocol following his admission.  He     went  into delirium tremens while in the hospital and was maintained     on the Ativan protocol and Haldol was added.  Psychiatry was     consulted and Dr. Rogers Blocker, saw the patient and added Librium for     3 days.  He gradually improved and his DTs have resolved at this     time.  Social work was consulted to place the patient in an     inpatient rehab facility for alcohol rehabilitation, they followed     up today and that rehab facility declined the patient.  Dr.     Rogers Blocker followed up with him and has cleared him for discharge on     outpatient setting.  The patient is not suicidal or psychotic at     this time and has no further withdrawal symptoms present.  The     social worker provided outpatient resources for outpatient rehab     and the patient and his wife agreed to contact them and to follow     up outpatient.  The patient was counseled to  quit alcohol. 2. Uncontrolled hypertension - worsened by his alcohol.  He was     counseled to quit alcohol as above.  His blood pressure medications     were adjusted - the carvedilol increased to 25 mg b.i.d. and his     ramipril was increased to 20 mg daily.  He received p.r.n.     clonidine coverage while in the hospital.  His blood pressures were     better controlled at this time and is to continue this medications     upon discharge and follow up outpatient. 3. History of cardiomyopathy/congestive heart failure - he was     maintained on his outpatient medications as above while in the     hospital and his CHF remained compensated. 4. Insomnia - the patient stated that he has had a chronic problem     with insomnia and reported that that is why he drinks.  He was     educated not to use alcohol for insomnia.  Ambien was tried without     success and he has been discharged on a trial of Restoril at this     time and he is to follow up outpatient. 5. Hyperlipidemia - he is to continue his Crestor upon discharge. 6. Transaminitis - the patient's AST and ALT were elevated - 60 and 62     respectively and a hepatitis acute panel was done and came back     negative.  The impression was that it was secondary to the alcohol     and he was counseled to quit alcohol as above.  DISCHARGE MEDICATIONS: 1. Restoril 15 mg one p.o. q.h.s. p.r.n. 2. Carvedilol 25 mg p.o. b.i.d. 3. Ramipril 20 mg p.o. daily. 4. Aspirin 325 mg p.o. daily. 5. Plavix 75 mg p.o. daily. 6. Folic acid 1 mg p.o. daily. 7. Multivitamins one p.o. daily. 8. Nicotine patch 14 mg daily. 9. Crestor 10 mg p.o. daily. 10.Vitamin B1 one p.o. daily.  DISCONTINUED MEDICATIONS:  Norvasc and Ativan.  FOLLOWUP CARE: 1. Dr. Brett Albino. Sprue in 1-2 weeks, call for appointment. 2. Outpatient alcohol rehab as above.  DISCHARGE CONDITION:  Improved/stable.     Kela Millin, M.D.     ACV/MEDQ  D:  12/11/2010  T:   12/11/2010  Job:  [id number]  Electronically Signed by Donnalee Curry M.D. on 12/15/2010 09:55:47 PM

## 2011-03-04 LAB — I-STAT 8, (EC8 V) (CONVERTED LAB)
Acid-Base Excess: 3 — ABNORMAL HIGH
BUN: 9
Bicarbonate: 25.2 — ABNORMAL HIGH
Chloride: 106
Glucose, Bld: 117 — ABNORMAL HIGH
HCT: 45
Hemoglobin: 15.3
Operator id: 198171
Potassium: 3.4 — ABNORMAL LOW
Sodium: 137
TCO2: 26
pCO2, Ven: 32.3 — ABNORMAL LOW
pH, Ven: 7.499 — ABNORMAL HIGH

## 2011-03-04 LAB — POCT I-STAT CREATININE
Creatinine, Ser: 1.2
Operator id: 198171

## 2011-03-04 LAB — CBC
HCT: 40.6
Hemoglobin: 13.5
MCHC: 33.3
MCV: 91.7
Platelets: 161
RBC: 4.43
RDW: 14
WBC: 6.3

## 2011-05-11 ENCOUNTER — Other Ambulatory Visit: Payer: Self-pay | Admitting: Cardiology

## 2011-08-07 ENCOUNTER — Emergency Department (HOSPITAL_COMMUNITY)
Admission: EM | Admit: 2011-08-07 | Discharge: 2011-08-07 | Disposition: A | Discharge status: Home / Self Care | Payer: Medicare HMO | Attending: Emergency Medicine | Admitting: Emergency Medicine

## 2011-08-07 ENCOUNTER — Other Ambulatory Visit: Payer: Self-pay

## 2011-08-07 ENCOUNTER — Emergency Department (HOSPITAL_COMMUNITY): Payer: Medicare HMO

## 2011-08-07 ENCOUNTER — Encounter (HOSPITAL_COMMUNITY): Payer: Self-pay | Admitting: Cardiology

## 2011-08-07 DIAGNOSIS — F10929 Alcohol use, unspecified with intoxication, unspecified: Secondary | ICD-10-CM

## 2011-08-07 DIAGNOSIS — I10 Essential (primary) hypertension: Secondary | ICD-10-CM | POA: Insufficient documentation

## 2011-08-07 DIAGNOSIS — R61 Generalized hyperhidrosis: Secondary | ICD-10-CM | POA: Insufficient documentation

## 2011-08-07 DIAGNOSIS — Z79899 Other long term (current) drug therapy: Secondary | ICD-10-CM | POA: Insufficient documentation

## 2011-08-07 DIAGNOSIS — R062 Wheezing: Secondary | ICD-10-CM | POA: Insufficient documentation

## 2011-08-07 DIAGNOSIS — R0602 Shortness of breath: Secondary | ICD-10-CM | POA: Insufficient documentation

## 2011-08-07 DIAGNOSIS — I509 Heart failure, unspecified: Secondary | ICD-10-CM | POA: Insufficient documentation

## 2011-08-07 DIAGNOSIS — R059 Cough, unspecified: Secondary | ICD-10-CM | POA: Insufficient documentation

## 2011-08-07 DIAGNOSIS — F1010 Alcohol abuse, uncomplicated: Secondary | ICD-10-CM | POA: Insufficient documentation

## 2011-08-07 DIAGNOSIS — R05 Cough: Secondary | ICD-10-CM | POA: Insufficient documentation

## 2011-08-07 DIAGNOSIS — R0609 Other forms of dyspnea: Secondary | ICD-10-CM | POA: Insufficient documentation

## 2011-08-07 DIAGNOSIS — R0989 Other specified symptoms and signs involving the circulatory and respiratory systems: Secondary | ICD-10-CM | POA: Insufficient documentation

## 2011-08-07 DIAGNOSIS — Z8673 Personal history of transient ischemic attack (TIA), and cerebral infarction without residual deficits: Secondary | ICD-10-CM | POA: Insufficient documentation

## 2011-08-07 DIAGNOSIS — E876 Hypokalemia: Secondary | ICD-10-CM | POA: Insufficient documentation

## 2011-08-07 DIAGNOSIS — R079 Chest pain, unspecified: Secondary | ICD-10-CM | POA: Insufficient documentation

## 2011-08-07 LAB — CBC
HCT: 36 % — ABNORMAL LOW (ref 39.0–52.0)
Hemoglobin: 12.4 g/dL — ABNORMAL LOW (ref 13.0–17.0)
MCH: 31.6 pg (ref 26.0–34.0)
MCHC: 34.4 g/dL (ref 30.0–36.0)
MCV: 91.6 fL (ref 78.0–100.0)
Platelets: 119 10*3/uL — ABNORMAL LOW (ref 150–400)
RBC: 3.93 MIL/uL — ABNORMAL LOW (ref 4.22–5.81)
RDW: 16.1 % — ABNORMAL HIGH (ref 11.5–15.5)
WBC: 4.3 10*3/uL (ref 4.0–10.5)

## 2011-08-07 LAB — COMPREHENSIVE METABOLIC PANEL
ALT: 141 U/L — ABNORMAL HIGH (ref 0–53)
AST: 222 U/L — ABNORMAL HIGH (ref 0–37)
Albumin: 3.9 g/dL (ref 3.5–5.2)
Alkaline Phosphatase: 117 U/L (ref 39–117)
BUN: 12 mg/dL (ref 6–23)
CO2: 31 mEq/L (ref 19–32)
Calcium: 9.6 mg/dL (ref 8.4–10.5)
Chloride: 98 mEq/L (ref 96–112)
Creatinine, Ser: 1.01 mg/dL (ref 0.50–1.35)
GFR calc Af Amer: >90 mL/min (ref >90)
GFR calc non Af Amer: 80 mL/min — ABNORMAL LOW (ref >90)
Glucose, Bld: 89 mg/dL (ref 70–99)
Potassium: 3.1 mEq/L — ABNORMAL LOW (ref 3.5–5.1)
Sodium: 146 mEq/L — ABNORMAL HIGH (ref 135–145)
Total Bilirubin: 0.5 mg/dL (ref 0.3–1.2)
Total Protein: 7.2 g/dL (ref 6.0–8.3)

## 2011-08-07 LAB — DIFFERENTIAL
Basophils Absolute: 0 10*3/uL (ref 0.0–0.1)
Basophils Relative: 1 % (ref 0–1)
Eosinophils Absolute: 0.2 10*3/uL (ref 0.0–0.7)
Eosinophils Relative: 5 % (ref 0–5)
Lymphocytes Relative: 47 % — ABNORMAL HIGH (ref 12–46)
Lymphs Abs: 2 10*3/uL (ref 0.7–4.0)
Monocytes Absolute: 0.3 10*3/uL (ref 0.1–1.0)
Monocytes Relative: 7 % (ref 3–12)
Neutro Abs: 1.7 10*3/uL (ref 1.7–7.7)
Neutrophils Relative %: 41 % — ABNORMAL LOW (ref 43–77)

## 2011-08-07 LAB — POCT I-STAT TROPONIN I
Troponin i, poc: 0.03 ng/mL (ref 0.00–0.08)
Troponin i, poc: 0.02 ng/mL (ref 0.00–0.08)

## 2011-08-07 LAB — PROTIME-INR
INR: 1.01 (ref 0.00–1.49)
Prothrombin Time: 13.5 seconds (ref 11.6–15.2)

## 2011-08-07 LAB — ETHANOL: Alcohol, Ethyl (B): 371 mg/dL — ABNORMAL HIGH (ref 0–11)

## 2011-08-07 LAB — PRO B NATRIURETIC PEPTIDE: Pro B Natriuretic peptide (BNP): 59.8 pg/mL (ref 0–125)

## 2011-08-07 LAB — APTT: aPTT: 26 seconds (ref 24–37)

## 2011-08-07 MED ORDER — CLOPIDOGREL BISULFATE 75 MG PO TABS: 75.0000 mg | ORAL_TABLET | Freq: Every day | ORAL | Status: DC

## 2011-08-07 MED ORDER — VITAMIN B-1 100 MG PO TABS: 100.0000 mg | ORAL_TABLET | Freq: Every day | ORAL | Status: DC

## 2011-08-07 MED ORDER — RAMIPRIL 10 MG PO CAPS: 20.0000 mg | ORAL_CAPSULE | Freq: Every day | ORAL | Status: DC

## 2011-08-07 MED ORDER — CARVEDILOL 25 MG PO TABS: 25.0000 mg | ORAL_TABLET | Freq: Two times a day (BID) | ORAL | Status: DC

## 2011-08-07 MED ORDER — POTASSIUM CHLORIDE CRYS ER 20 MEQ PO TBCR
40.0000 meq | EXTENDED_RELEASE_TABLET | Freq: Once | ORAL | Status: AC
  Administered 2011-08-07: 40 meq via ORAL
  Filled 2011-08-07: qty 2

## 2011-08-07 MED ORDER — FOLIC ACID 1 MG PO TABS: 1.0000 mg | ORAL_TABLET | Freq: Every day | ORAL | Status: DC

## 2011-08-07 MED ORDER — ROSUVASTATIN CALCIUM 10 MG PO TABS: 10.0000 mg | ORAL_TABLET | Freq: Every day | ORAL | Status: DC

## 2011-08-07 MED ORDER — POTASSIUM CHLORIDE CRYS ER 20 MEQ PO TBCR: 20.0000 meq | EXTENDED_RELEASE_TABLET | Freq: Two times a day (BID) | ORAL | Status: DC

## 2011-08-07 NOTE — ED Notes (Signed)
Patient resting with NAD. Patient remains on monitor and 2L oxygen. Family at bedside.

## 2011-08-07 NOTE — Discharge Instructions (Signed)
You  need to stop drinking so much!  You also need to stop smoking!  I have prescribed your medication, you will need to see Dr Sharyn Lull or Spruill to stay on your medications.

## 2011-08-07 NOTE — ED Notes (Signed)
Pt to department via EMS-pt reports that he started having chest pain yesterday, substernal non-radiating in nature. Pt took 324 ASA PTA and 1 SL nitro. Pt denies any n/v or diaphoresis with the pain. Bp- 150/102 Hr- 90. 18g. No acute distress noted.

## 2011-08-07 NOTE — ED Notes (Signed)
Patietn is being held for Med rec. from pharmacy, then Dr. Lynelle Doctor will write scrips. and discharge patient.

## 2011-08-07 NOTE — ED Notes (Signed)
Patient remains on monitor and 2L oxygen with NAD at this time. Family at bedside.

## 2011-08-07 NOTE — ED Provider Notes (Signed)
History     CSN: [id number]  Arrival date & time 08/07/11  8295   First MD Initiated Contact with Patient 08/07/11 [phone]      Chief Complaint  Patient presents with  . Chest Pain    (Consider location/radiation/quality/duration/timing/severity/associated sxs/prior treatment) HPI  Patient relates he has had constant mid left sided chest pain for the past week. He states it is dull in character. He states he chronically has shortness of breath but his wife states he is getting worsening dyspnea on exertion. She states he got only walk 10 feet without getting short of breath which is not usual. He denies nausea, vomiting, or fever. He states his cough has worsened and is coughing up phlegm but he does not look at it. He also states he's been having sweats. He denies any swelling in his legs. Of note patient has not taken any of his medications in the past 3 months.  PCP Dr. Sharyn Lull  and Dr. Shana Chute   Past Medical History  Diagnosis Date  . CVA (cerebral vascular accident)   . Hypertension    congestive heart failure Alcohol abuse  History reviewed. No pertinent past surgical history.  History reviewed. No pertinent family history.  History  Substance Use Topics  . Smoking status: amokes 1 ppd  . Smokeless tobacco: Not on file  . Alcohol Use: Drinks 1 gallon of moonshine a day  Lives with wife On disability for congestive heart failure    Review of Systems  All other systems reviewed and are negative.    Allergies  Codeine and Penicillins  Home Medications   Current Outpatient Rx  Name Route Sig Dispense Refill  . CARVEDILOL 25 MG PO TABS Oral Take 1 tablet (25 mg total) by mouth 2 (two) times daily with a meal. 60 tablet 0  . CLOPIDOGREL BISULFATE 75 MG PO TABS Oral Take 1 tablet (75 mg total) by mouth daily. 30 tablet 0  . FOLIC ACID 1 MG PO TABS Oral Take 1 tablet (1 mg total) by mouth daily. 30 tablet 0  . POTASSIUM CHLORIDE CRYS ER 20 MEQ PO TBCR Oral Take 1  tablet (20 mEq total) by mouth 2 (two) times daily. 12 tablet 0  . RAMIPRIL 10 MG PO CAPS Oral Take 2 capsules (20 mg total) by mouth daily. 60 capsule 0  . ROSUVASTATIN CALCIUM 10 MG PO TABS Oral Take 1 tablet (10 mg total) by mouth daily. 30 tablet 0  . VITAMIN B-1 100 MG PO TABS Oral Take 1 tablet (100 mg total) by mouth daily. 30 tablet 0  Has not taken meds in 3 months  BP 150/108  Pulse 94  Temp(Src) 97 F (36.1 C) (Oral)  Resp 18  SpO2 98%  Vital signs normal except hypertension   Physical Exam  Constitutional: He is oriented to person, place, and time. He appears well-developed and well-nourished.  Non-toxic appearance. He does not appear ill. No distress.  HENT:  Head: Normocephalic and atraumatic.  Right Ear: External ear normal.  Left Ear: External ear normal.  Nose: Nose normal. No mucosal edema or rhinorrhea.  Mouth/Throat: Oropharynx is clear and moist and mucous membranes are normal. No dental abscesses or uvula swelling.  Eyes: Conjunctivae and EOM are normal. Pupils are equal, round, and reactive to light.  Neck: Normal range of motion and full passive range of motion without pain. Neck supple.  Cardiovascular: Normal rate, regular rhythm and normal heart sounds.  Exam reveals no gallop and no friction  rub.   No murmur heard. Pulmonary/Chest: Effort normal. No respiratory distress. He has no wheezes. He has no rhonchi. He has rales. He exhibits no tenderness and no crepitus.       Patient has rales in his left base. He has rare very late and expiratory wheeze. However sitting in the stretcher he does not appear to be short of breath.  Abdominal: Soft. Normal appearance and bowel sounds are normal. He exhibits no distension. There is no tenderness. There is no rebound and no guarding.  Musculoskeletal: Normal range of motion. He exhibits no edema and no tenderness.       Moves all extremities well.   Neurological: He is alert and oriented to person, place, and time.  He has normal strength. No cranial nerve deficit.  Skin: Skin is warm, dry and intact. No rash noted. No erythema. No pallor.  Psychiatric: He has a normal mood and affect. His speech is normal and behavior is normal. His mood appears not anxious.    ED Course  Procedures (including critical care time)  Pt states he last drank at 8 pm last night.   Medications  potassium chloride SA (K-DUR,KLOR-CON) CR tablet 40 mEq (40 mEq Oral Given 08/07/11 1220)   Pt is watching TV in nodistress.    Results for orders placed during the hospital encounter of 08/07/11  CBC      Component Value Range   WBC 4.3  4.0 - 10.5 (K/uL)   RBC 3.93 (*) 4.22 - 5.81 (MIL/uL)   Hemoglobin 12.4 (*) 13.0 - 17.0 (g/dL)   HCT 16.1 (*) 09.6 - 52.0 (%)   MCV 91.6  78.0 - 100.0 (fL)   MCH 31.6  26.0 - 34.0 (pg)   MCHC 34.4  30.0 - 36.0 (g/dL)   RDW 04.5 (*) 40.9 - 15.5 (%)   Platelets 119 (*) 150 - 400 (K/uL)  DIFFERENTIAL      Component Value Range   Neutrophils Relative 41 (*) 43 - 77 (%)   Neutro Abs 1.7  1.7 - 7.7 (K/uL)   Lymphocytes Relative 47 (*) 12 - 46 (%)   Lymphs Abs 2.0  0.7 - 4.0 (K/uL)   Monocytes Relative 7  3 - 12 (%)   Monocytes Absolute 0.3  0.1 - 1.0 (K/uL)   Eosinophils Relative 5  0 - 5 (%)   Eosinophils Absolute 0.2  0.0 - 0.7 (K/uL)   Basophils Relative 1  0 - 1 (%)   Basophils Absolute 0.0  0.0 - 0.1 (K/uL)  COMPREHENSIVE METABOLIC PANEL      Component Value Range   Sodium 146 (*) 135 - 145 (mEq/L)   Potassium 3.1 (*) 3.5 - 5.1 (mEq/L)   Chloride 98  96 - 112 (mEq/L)   CO2 31  19 - 32 (mEq/L)   Glucose, Bld 89  70 - 99 (mg/dL)   BUN 12  6 - 23 (mg/dL)   Creatinine, Ser 8.11  0.50 - 1.35 (mg/dL)   Calcium 9.6  8.4 - 91.4 (mg/dL)   Total Protein 7.2  6.0 - 8.3 (g/dL)   Albumin 3.9  3.5 - 5.2 (g/dL)   AST 782 (*) 0 - 37 (U/L)   ALT 141 (*) 0 - 53 (U/L)   Alkaline Phosphatase 117  39 - 117 (U/L)   Total Bilirubin 0.5  0.3 - 1.2 (mg/dL)   GFR calc non Af Amer 80 (*) >90  (mL/min)   GFR calc Af Amer >90  >90 (mL/min)  ETHANOL      Component Value Range   Alcohol, Ethyl (B) 371 (*) 0 - 11 (mg/dL)  APTT      Component Value Range   aPTT 26  24 - 37 (seconds)  PROTIME-INR      Component Value Range   Prothrombin Time 13.5  11.6 - 15.2 (seconds)   INR 1.01  0.00 - 1.49   PRO B NATRIURETIC PEPTIDE      Component Value Range   Pro B Natriuretic peptide (BNP) 59.8  0 - 125 (pg/mL)  POCT I-STAT TROPONIN I      Component Value Range   Troponin i, poc 0.03  0.00 - 0.08 (ng/mL)   Comment 3           POCT I-STAT TROPONIN I      Component Value Range   Troponin i, poc 0.02  0.00 - 0.08 (ng/mL)   Comment 3            Laboratory interpretation all normal except mild anemia   Dg Chest 2 View  08/07/2011  *RADIOLOGY REPORT*  Clinical Data:  Chest pain and shortness of breath.  CHEST - 2 VIEW  Comparison: Chest x-ray 12/01/2010.  Findings: Lung volumes are normal.  No consolidative airspace disease.  No pleural effusions.  No pneumothorax.  No pulmonary nodule or mass noted.  Pulmonary vasculature and the cardiomediastinal silhouette are within normal limits.  A right- sided AICD is in place with lead tip projecting over the expected location of the right ventricular apex.  IMPRESSION: No radiographic evidence of acute cardiopulmonary disease.  Original Report Authenticated By: Florencia Reasons, M.D.     Date: 08/07/2011  Rate: 90  Rhythm: normal sinus rhythm  QRS Axis: normal  Intervals: normal  ST/T Wave abnormalities: nonspecific T wave changes  Conduction Disutrbances:none  Narrative Interpretation: prolonged QT, PAC's  Old EKG Reviewed: unchanged from 12/01/2010    1. Chest pain   2. Alcohol intoxication   3. Hypokalemia    New Prescriptions   CARVEDILOL (COREG) 25 MG TABLET    Take 1 tablet (25 mg total) by mouth 2 (two) times daily with a meal.   CLOPIDOGREL (PLAVIX) 75 MG TABLET    Take 1 tablet (75 mg total) by mouth daily.   FOLIC ACID  (FOLVITE) 1 MG TABLET    Take 1 tablet (1 mg total) by mouth daily.   POTASSIUM CHLORIDE SA (K-DUR,KLOR-CON) 20 MEQ TABLET    Take 1 tablet (20 mEq total) by mouth 2 (two) times daily.   RAMIPRIL (ALTACE) 10 MG CAPSULE    Take 2 capsules (20 mg total) by mouth daily.   ROSUVASTATIN (CRESTOR) 10 MG TABLET    Take 1 tablet (10 mg total) by mouth daily.   THIAMINE (VITAMIN B-1) 100 MG TABLET    Take 1 tablet (100 mg total) by mouth daily.   Plan discharge Devoria Albe, MD, FACEP    MDM          Ward Givens, MD 08/07/11 [phone]

## 2011-08-07 NOTE — ED Notes (Signed)
Patient states he started to have chest pain yesterday. Patient denies N/V/F. Patient states he has a productive cough and denies pain when coughing. Patient states he is SOB ali the time and patients wife states he is SOB all the time. Patient states he drink 1/2 gallon [employer] per day and his last drink was yesterday. Patient placed on monitor with 2L oxygen and sats of 94%. Family at the bedside.

## 2011-08-08 ENCOUNTER — Inpatient Hospital Stay (HOSPITAL_COMMUNITY)
Admission: EM | Admit: 2011-08-08 | Discharge: 2011-08-14 | DRG: 100 | Disposition: A | Discharge status: Home Health | Payer: Medicare HMO | Attending: Internal Medicine | Admitting: Internal Medicine

## 2011-08-08 ENCOUNTER — Encounter (HOSPITAL_COMMUNITY): Payer: Self-pay

## 2011-08-08 ENCOUNTER — Emergency Department (HOSPITAL_COMMUNITY): Payer: Medicare HMO

## 2011-08-08 ENCOUNTER — Other Ambulatory Visit: Payer: Self-pay

## 2011-08-08 DIAGNOSIS — G40909 Epilepsy, unspecified, not intractable, without status epilepticus: Principal | ICD-10-CM | POA: Diagnosis present

## 2011-08-08 DIAGNOSIS — Z23 Encounter for immunization: Secondary | ICD-10-CM

## 2011-08-08 DIAGNOSIS — F17200 Nicotine dependence, unspecified, uncomplicated: Secondary | ICD-10-CM | POA: Diagnosis present

## 2011-08-08 DIAGNOSIS — I10 Essential (primary) hypertension: Secondary | ICD-10-CM | POA: Diagnosis present

## 2011-08-08 DIAGNOSIS — N179 Acute kidney failure, unspecified: Secondary | ICD-10-CM | POA: Diagnosis not present

## 2011-08-08 DIAGNOSIS — F1020 Alcohol dependence, uncomplicated: Secondary | ICD-10-CM | POA: Diagnosis present

## 2011-08-08 DIAGNOSIS — Z7902 Long term (current) use of antithrombotics/antiplatelets: Secondary | ICD-10-CM

## 2011-08-08 DIAGNOSIS — D6959 Other secondary thrombocytopenia: Secondary | ICD-10-CM | POA: Diagnosis present

## 2011-08-08 DIAGNOSIS — Z720 Tobacco use: Secondary | ICD-10-CM | POA: Diagnosis present

## 2011-08-08 DIAGNOSIS — K219 Gastro-esophageal reflux disease without esophagitis: Secondary | ICD-10-CM | POA: Diagnosis present

## 2011-08-08 DIAGNOSIS — R569 Unspecified convulsions: Secondary | ICD-10-CM

## 2011-08-08 DIAGNOSIS — F1010 Alcohol abuse, uncomplicated: Secondary | ICD-10-CM

## 2011-08-08 DIAGNOSIS — R790 Abnormal level of blood mineral: Secondary | ICD-10-CM | POA: Diagnosis present

## 2011-08-08 DIAGNOSIS — I5020 Unspecified systolic (congestive) heart failure: Secondary | ICD-10-CM | POA: Diagnosis present

## 2011-08-08 DIAGNOSIS — T4275XA Adverse effect of unspecified antiepileptic and sedative-hypnotic drugs, initial encounter: Secondary | ICD-10-CM | POA: Diagnosis present

## 2011-08-08 DIAGNOSIS — Z95810 Presence of automatic (implantable) cardiac defibrillator: Secondary | ICD-10-CM

## 2011-08-08 DIAGNOSIS — I509 Heart failure, unspecified: Secondary | ICD-10-CM | POA: Diagnosis present

## 2011-08-08 DIAGNOSIS — I5022 Chronic systolic (congestive) heart failure: Secondary | ICD-10-CM | POA: Diagnosis present

## 2011-08-08 DIAGNOSIS — Z8673 Personal history of transient ischemic attack (TIA), and cerebral infarction without residual deficits: Secondary | ICD-10-CM

## 2011-08-08 DIAGNOSIS — Z79899 Other long term (current) drug therapy: Secondary | ICD-10-CM

## 2011-08-08 DIAGNOSIS — G929 Unspecified toxic encephalopathy: Secondary | ICD-10-CM | POA: Diagnosis present

## 2011-08-08 DIAGNOSIS — G92 Toxic encephalopathy: Secondary | ICD-10-CM | POA: Diagnosis present

## 2011-08-08 DIAGNOSIS — T424X5A Adverse effect of benzodiazepines, initial encounter: Secondary | ICD-10-CM | POA: Diagnosis present

## 2011-08-08 DIAGNOSIS — E876 Hypokalemia: Secondary | ICD-10-CM | POA: Diagnosis present

## 2011-08-08 HISTORY — DX: Epilepsy, unspecified, not intractable, without status epilepticus: G40.909

## 2011-08-08 HISTORY — DX: Presence of automatic (implantable) cardiac defibrillator: Z95.810

## 2011-08-08 HISTORY — DX: Heart failure, unspecified: I50.9

## 2011-08-08 LAB — URINALYSIS, ROUTINE W REFLEX MICROSCOPIC
Bilirubin Urine: NEGATIVE
Glucose, UA: NEGATIVE mg/dL
Ketones, ur: >80 mg/dL — AB
Leukocytes, UA: NEGATIVE
Nitrite: NEGATIVE
Protein, ur: 100 mg/dL — AB
Specific Gravity, Urine: 1.013 (ref 1.005–1.030)
Urobilinogen, UA: 1 mg/dL (ref 0.0–1.0)
pH: 6.5 (ref 5.0–8.0)

## 2011-08-08 LAB — RAPID URINE DRUG SCREEN, HOSP PERFORMED
Amphetamines: NOT DETECTED
Barbiturates: NOT DETECTED
Benzodiazepines: POSITIVE — AB
Cocaine: NOT DETECTED
Opiates: NOT DETECTED
Tetrahydrocannabinol: NOT DETECTED

## 2011-08-08 LAB — PHOSPHORUS: Phosphorus: 1.2 mg/dL — ABNORMAL LOW (ref 2.3–4.6)

## 2011-08-08 LAB — CBC
HCT: 34.7 % — ABNORMAL LOW (ref 39.0–52.0)
Hemoglobin: 12 g/dL — ABNORMAL LOW (ref 13.0–17.0)
MCH: 31.1 pg (ref 26.0–34.0)
MCHC: 34.6 g/dL (ref 30.0–36.0)
MCV: 89.9 fL (ref 78.0–100.0)
Platelets: 80 10*3/uL — ABNORMAL LOW (ref 150–400)
RBC: 3.86 MIL/uL — ABNORMAL LOW (ref 4.22–5.81)
RDW: 15.9 % — ABNORMAL HIGH (ref 11.5–15.5)
WBC: 4.3 10*3/uL (ref 4.0–10.5)

## 2011-08-08 LAB — MRSA PCR SCREENING: MRSA by PCR: NEGATIVE

## 2011-08-08 LAB — CK: Total CK: 144 U/L (ref 7–232)

## 2011-08-08 LAB — URINE MICROSCOPIC-ADD ON

## 2011-08-08 LAB — ETHANOL: Alcohol, Ethyl (B): <11 mg/dL (ref 0–11)

## 2011-08-08 LAB — BASIC METABOLIC PANEL
BUN: 8 mg/dL (ref 6–23)
CO2: 22 mEq/L (ref 19–32)
Calcium: 9.3 mg/dL (ref 8.4–10.5)
Chloride: 90 mEq/L — ABNORMAL LOW (ref 96–112)
Creatinine, Ser: 0.83 mg/dL (ref 0.50–1.35)
GFR calc Af Amer: >90 mL/min (ref >90)
GFR calc non Af Amer: >90 mL/min (ref >90)
Glucose, Bld: 91 mg/dL (ref 70–99)
Potassium: 3.1 mEq/L — ABNORMAL LOW (ref 3.5–5.1)
Sodium: 137 mEq/L (ref 135–145)

## 2011-08-08 LAB — PROLACTIN: Prolactin: 14.3 ng/mL (ref 2.1–17.1)

## 2011-08-08 LAB — MAGNESIUM: Magnesium: 1.1 mg/dL — ABNORMAL LOW (ref 1.5–2.5)

## 2011-08-08 MED ORDER — SODIUM CHLORIDE 0.9 % IJ SOLN
3.0000 mL | INTRAMUSCULAR | Status: DC | PRN
  Administered 2011-08-11: 3 mL via INTRAVENOUS

## 2011-08-08 MED ORDER — MAGNESIUM SULFATE 40 MG/ML IJ SOLN
2.0000 g | Freq: Once | INTRAMUSCULAR | Status: AC
  Administered 2011-08-08: 2 g via INTRAVENOUS
  Filled 2011-08-08 (×2): qty 50

## 2011-08-08 MED ORDER — PANTOPRAZOLE SODIUM 40 MG IV SOLR
40.0000 mg | Freq: Every day | INTRAVENOUS | Status: DC
  Administered 2011-08-08: 40 mg via INTRAVENOUS
  Filled 2011-08-08 (×2): qty 40

## 2011-08-08 MED ORDER — LORAZEPAM 2 MG/ML IJ SOLN
1.0000 mg | INTRAMUSCULAR | Status: DC | PRN
  Administered 2011-08-08: 1 mg via INTRAVENOUS
  Filled 2011-08-08: qty 1

## 2011-08-08 MED ORDER — SODIUM CHLORIDE 0.9 % IV SOLN: 1.0000 mg | Freq: Once | INTRAVENOUS | Status: DC

## 2011-08-08 MED ORDER — THIAMINE HCL 100 MG/ML IJ SOLN
Freq: Once | INTRAVENOUS | Status: AC
  Administered 2011-08-08: 21:00:00 via INTRAVENOUS
  Filled 2011-08-08: qty 500

## 2011-08-08 MED ORDER — SODIUM CHLORIDE 0.9 % IV BOLUS (SEPSIS)
1000.0000 mL | Freq: Once | INTRAVENOUS | Status: AC
  Administered 2011-08-08: 1000 mL via INTRAVENOUS

## 2011-08-08 MED ORDER — LORAZEPAM 2 MG/ML IJ SOLN
0.0000 mg | Freq: Two times a day (BID) | INTRAMUSCULAR | Status: AC
  Administered 2011-08-10 – 2011-08-11 (×2): 1 mg via INTRAVENOUS
  Filled 2011-08-08: qty 1

## 2011-08-08 MED ORDER — SODIUM CHLORIDE 0.9 % IV SOLN
500.0000 mg | Freq: Two times a day (BID) | INTRAVENOUS | Status: DC
  Filled 2011-08-08: qty 5

## 2011-08-08 MED ORDER — LORAZEPAM 2 MG/ML IJ SOLN
1.0000 mg | INTRAMUSCULAR | Status: DC | PRN
  Administered 2011-08-08 (×2): 1 mg via INTRAVENOUS

## 2011-08-08 MED ORDER — SODIUM CHLORIDE 0.9 % IJ SOLN
3.0000 mL | Freq: Two times a day (BID) | INTRAMUSCULAR | Status: DC
  Administered 2011-08-08 – 2011-08-14 (×11): 3 mL via INTRAVENOUS

## 2011-08-08 MED ORDER — BIOTENE DRY MOUTH MT LIQD
15.0000 mL | Freq: Two times a day (BID) | OROMUCOSAL | Status: DC
  Administered 2011-08-08 – 2011-08-14 (×12): 15 mL via OROMUCOSAL

## 2011-08-08 MED ORDER — LEVETIRACETAM 500 MG/5ML IV SOLN
500.0000 mg | INTRAVENOUS | Status: DC
  Administered 2011-08-08: 500 mg via INTRAVENOUS
  Filled 2011-08-08: qty 5

## 2011-08-08 MED ORDER — ONDANSETRON HCL 4 MG PO TABS: 4.0000 mg | ORAL_TABLET | Freq: Four times a day (QID) | ORAL | Status: DC | PRN

## 2011-08-08 MED ORDER — SODIUM CHLORIDE 0.9 % IV SOLN
500.0000 mg | Freq: Once | INTRAVENOUS | Status: AC
  Administered 2011-08-08: 500 mg via INTRAVENOUS
  Filled 2011-08-08: qty 5

## 2011-08-08 MED ORDER — INFLUENZA VIRUS VACC SPLIT PF IM SUSP
0.5000 mL | INTRAMUSCULAR | Status: AC
  Administered 2011-08-09: 0.5 mL via INTRAMUSCULAR
  Filled 2011-08-08: qty 0.5

## 2011-08-08 MED ORDER — LORAZEPAM 2 MG/ML IJ SOLN
0.0000 mg | Freq: Four times a day (QID) | INTRAMUSCULAR | Status: AC
  Administered 2011-08-08 – 2011-08-09 (×5): 2 mg via INTRAVENOUS
  Administered 2011-08-10: 1 mg via INTRAVENOUS
  Administered 2011-08-10: 2 mg via INTRAVENOUS
  Filled 2011-08-08 (×4): qty 1
  Filled 2011-08-08: qty 2
  Filled 2011-08-08 (×4): qty 1

## 2011-08-08 MED ORDER — HYDRALAZINE HCL 20 MG/ML IJ SOLN
5.0000 mg | Freq: Four times a day (QID) | INTRAMUSCULAR | Status: DC
  Administered 2011-08-08 – 2011-08-09 (×2): 5 mg via INTRAVENOUS
  Filled 2011-08-08: qty 1
  Filled 2011-08-08 (×4): qty 0.25
  Filled 2011-08-08: qty 1

## 2011-08-08 MED ORDER — LORAZEPAM 2 MG/ML IJ SOLN
INTRAMUSCULAR | Status: AC
  Administered 2011-08-08: 1 mg via INTRAVENOUS
  Filled 2011-08-08: qty 1

## 2011-08-08 MED ORDER — PNEUMOCOCCAL VAC POLYVALENT 25 MCG/0.5ML IJ INJ
0.5000 mL | INJECTION | INTRAMUSCULAR | Status: AC
  Administered 2011-08-09: 0.5 mL via INTRAMUSCULAR
  Filled 2011-08-08: qty 0.5

## 2011-08-08 MED ORDER — SODIUM CHLORIDE 0.9 % IV SOLN
1000.0000 mg | Freq: Once | INTRAVENOUS | Status: DC
  Filled 2011-08-08: qty 10

## 2011-08-08 MED ORDER — ACETAMINOPHEN 650 MG RE SUPP: 650.0000 mg | Freq: Four times a day (QID) | RECTAL | Status: DC | PRN

## 2011-08-08 MED ORDER — SODIUM CHLORIDE 0.9 % IV SOLN: 250.0000 mL | INTRAVENOUS | Status: DC | PRN

## 2011-08-08 MED ORDER — PHENYTOIN SODIUM 50 MG/ML IJ SOLN
100.0000 mg | Freq: Three times a day (TID) | INTRAMUSCULAR | Status: DC
  Administered 2011-08-08 – 2011-08-09 (×2): 100 mg via INTRAVENOUS
  Filled 2011-08-08 (×5): qty 2

## 2011-08-08 MED ORDER — SODIUM CHLORIDE 0.9 % IV SOLN: 1200.0000 mg | Freq: Once | INTRAVENOUS | Status: DC

## 2011-08-08 MED ORDER — SODIUM CHLORIDE 0.9 % IV SOLN
1000.0000 mg | Freq: Once | INTRAVENOUS | Status: AC
  Administered 2011-08-08: 1000 mg via INTRAVENOUS
  Filled 2011-08-08: qty 20

## 2011-08-08 MED ORDER — NICOTINE 14 MG/24HR TD PT24
14.0000 mg | MEDICATED_PATCH | Freq: Every day | TRANSDERMAL | Status: DC
  Administered 2011-08-08 – 2011-08-14 (×7): 14 mg via TRANSDERMAL
  Filled 2011-08-08 (×7): qty 1

## 2011-08-08 MED ORDER — ONDANSETRON HCL 4 MG/2ML IJ SOLN
4.0000 mg | Freq: Four times a day (QID) | INTRAMUSCULAR | Status: DC | PRN
  Administered 2011-08-09: 4 mg via INTRAVENOUS
  Filled 2011-08-08: qty 2

## 2011-08-08 MED ORDER — POTASSIUM CHLORIDE 10 MEQ/100ML IV SOLN
10.0000 meq | Freq: Once | INTRAVENOUS | Status: AC
  Administered 2011-08-08: 10 meq via INTRAVENOUS
  Filled 2011-08-08: qty 100

## 2011-08-08 MED ORDER — THIAMINE HCL 100 MG/ML IJ SOLN
100.0000 mg | Freq: Every day | INTRAMUSCULAR | Status: DC
  Administered 2011-08-09 – 2011-08-10 (×2): 100 mg via INTRAVENOUS
  Filled 2011-08-08 (×2): qty 1

## 2011-08-08 MED ORDER — ACETAMINOPHEN 325 MG PO TABS
650.0000 mg | ORAL_TABLET | Freq: Four times a day (QID) | ORAL | Status: DC | PRN
  Administered 2011-08-10 – 2011-08-12 (×2): 650 mg via ORAL
  Filled 2011-08-08 (×3): qty 2

## 2011-08-08 NOTE — ED Notes (Signed)
Neuro MD at bedside for eval.  Pt now opening eyes and attempting to move arms and legs.  Adjusted pt in bed.

## 2011-08-08 NOTE — Consult Note (Signed)
Reason for Consult: "seizures"  HPI: [name] Phillips is an 56 y.o. Male who is a chronic alcoholic with history of multiple falls in the past due to inebriation who comes in with 2 seizures today and one seizure in ED that was described as GTC seizure with post-ictal confusion. Patient was last drinking last night. According to wife, he drinks a milk jug of moonshine daily and has done this for many years.   Past Medical History  Diagnosis Date  . CVA (cerebral vascular accident)   . Hypertension   . CHF (congestive heart failure)   . AICD (automatic cardioverter/defibrillator) present    Medications: I have reviewed the patient's current medications.  Past Surgical History  Procedure Date  . Cardiac defibrillator placement    History reviewed. No pertinent family history.  Social History:  reports that he has been smoking.  He does not have any smokeless tobacco history on file. He reports that he drinks alcohol. He reports that he does not use illicit drugs.  Allergies:  Allergies  Allergen Reactions  . Codeine Hives  . Penicillins Hives   ROS: as above  Blood pressure 176/132, pulse 109, temperature 98 F (36.7 C), temperature source Oral, resp. rate 11, weight 80.74 kg (178 lb), SpO2 97.00%.  Neurological exam: Expressive aphasia. Did not follow commands. Did not attend to examiner. Cranial nerves: EOMI, PERRL. Blink to threat positive bilaterally. Sensation to V1 through V3 areas of the face was intact and symmetric throughout. There was no facial asymmetry. Hearing to finger rub was equal and symmetrical bilaterally. Shoulder shrug was 5/5 and symmetric bilaterally. Head rotation was 5/5 bilaterally. There was no dysarthria or palatal deviation. Motor: strength was grossly intact, but very poor compliance with exam. Sensory: intact throughout to pain. Coordination: finger-to-nose was intact and symmetric bilaterally. Reflexes: were 1+ in upper extremities and 1+ at the knees and  1+ at the ankles. Plantar response was downgoing bilaterally. Gait: deferred  Results for orders placed during the hospital encounter of 08/08/11 (from the past 48 hour(s))  CBC     Status: Abnormal   Collection Time   08/08/11  1:40 PM      Component Value Range Comment   WBC 4.3  4.0 - 10.5 (K/uL)    RBC 3.86 (*) 4.22 - 5.81 (MIL/uL)    Hemoglobin 12.0 (*) 13.0 - 17.0 (g/dL)    HCT 16.1 (*) 09.6 - 52.0 (%)    MCV 89.9  78.0 - 100.0 (fL)    MCH 31.1  26.0 - 34.0 (pg)    MCHC 34.6  30.0 - 36.0 (g/dL)    RDW 04.5 (*) 40.9 - 15.5 (%)    Platelets 80 (*) 150 - 400 (K/uL)   BASIC METABOLIC PANEL     Status: Abnormal   Collection Time   08/08/11  1:40 PM      Component Value Range Comment   Sodium 137  135 - 145 (mEq/L) DELTA CHECK NOTED   Potassium 3.1 (*) 3.5 - 5.1 (mEq/L)    Chloride 90 (*) 96 - 112 (mEq/L) DELTA CHECK NOTED   CO2 22  19 - 32 (mEq/L)    Glucose, Bld 91  70 - 99 (mg/dL)    BUN 8  6 - 23 (mg/dL)    Creatinine, Ser 8.11  0.50 - 1.35 (mg/dL)    Calcium 9.3  8.4 - 10.5 (mg/dL)    GFR calc non Af Amer >90  >90 (mL/min)    GFR calc  Af Amer >90  >90 (mL/min)   MAGNESIUM     Status: Abnormal   Collection Time   08/08/11  1:40 PM      Component Value Range Comment   Magnesium 1.1 (*) 1.5 - 2.5 (mg/dL)   ETHANOL     Status: Normal   Collection Time   08/08/11  3:29 PM      Component Value Range Comment   Alcohol, Ethyl (B) <11  0 - 11 (mg/dL)   CK     Status: Normal   Collection Time   08/08/11  3:29 PM      Component Value Range Comment   Total CK 144  7 - 232 (U/L)   URINALYSIS, ROUTINE W REFLEX MICROSCOPIC     Status: Abnormal   Collection Time   08/08/11  4:00 PM      Component Value Range Comment   Color, Urine YELLOW  YELLOW     APPearance CLEAR  CLEAR     Specific Gravity, Urine 1.013  1.005 - 1.030     pH 6.5  5.0 - 8.0     Glucose, UA NEGATIVE  NEGATIVE (mg/dL)    Hgb urine dipstick LARGE (*) NEGATIVE     Bilirubin Urine NEGATIVE  NEGATIVE     Ketones,  ur >80 (*) NEGATIVE (mg/dL)    Protein, ur 657 (*) NEGATIVE (mg/dL)    Urobilinogen, UA 1.0  0.0 - 1.0 (mg/dL)    Nitrite NEGATIVE  NEGATIVE     Leukocytes, UA NEGATIVE  NEGATIVE    URINE RAPID DRUG SCREEN (HOSP PERFORMED)     Status: Abnormal   Collection Time   08/08/11  4:00 PM      Component Value Range Comment   Opiates NONE DETECTED  NONE DETECTED     Cocaine NONE DETECTED  NONE DETECTED     Benzodiazepines POSITIVE (*) NONE DETECTED     Amphetamines NONE DETECTED  NONE DETECTED     Tetrahydrocannabinol NONE DETECTED  NONE DETECTED     Barbiturates NONE DETECTED  NONE DETECTED    URINE MICROSCOPIC-ADD ON     Status: Normal   Collection Time   08/08/11  4:00 PM      Component Value Range Comment   Squamous Epithelial / LPF RARE  RARE     WBC, UA 0-2  <3 (WBC/hpf)    RBC / HPF 0-2  <3 (RBC/hpf)    Bacteria, UA RARE  RARE     Dg Chest 2 View  08/07/2011  *RADIOLOGY REPORT*  Clinical Data:  Chest pain and shortness of breath.  CHEST - 2 VIEW  Comparison: Chest x-ray 12/01/2010.  Findings: Lung volumes are normal.  No consolidative airspace disease.  No pleural effusions.  No pneumothorax.  No pulmonary nodule or mass noted.  Pulmonary vasculature and the cardiomediastinal silhouette are within normal limits.  A right- sided AICD is in place with lead tip projecting over the expected location of the right ventricular apex.  IMPRESSION: No radiographic evidence of acute cardiopulmonary disease.  Original Report Authenticated By: Florencia Reasons, M.D.   Ct Head Wo Contrast  08/08/2011  *RADIOLOGY REPORT*  Clinical Data: Seizure.  Chronic ethanol abuse.  Uncontrolled hypertension.  CT HEAD WITHOUT CONTRAST  Technique:  Contiguous axial images were obtained from the base of the skull through the vertex without contrast.  Comparison: 12/03/2010  Findings: Mild soft tissue swelling over the left side of the forehead. There is atrophy and chronic small vessel  disease changes.  Old left  occipital infarct, stable. No acute intracranial abnormality.  Specifically, no hemorrhage, hydrocephalus, mass lesion, acute infarction, or significant intracranial injury.  No acute calvarial abnormality. Visualized paranasal sinuses and mastoids clear.  Orbital soft tissues unremarkable.  IMPRESSION: No acute intracranial abnormality.  Atrophy, chronic microvascular disease.  Old left occipital infarct.  Original Report Authenticated By: Cyndie Chime, M.D.   Assessment/Plan: 56 years old man with history of moonshine abuse who had 3 GTC seizures today and patient also has a history of falls in the past - he has gotten one gram of Keppra in ED and is at this time post-ictal 1) Dilantin 1G load 2) Albumin and Dilantin levels checked in am 3) Dilantin 100 mg IV q8h 4) Seizure precautions 5) Neurochecks q4h  Keandrea Tapley 08/08/2011, 6:14 PM

## 2011-08-08 NOTE — ED Notes (Signed)
Pt was brought in by EMS, with seizure x 2, one at home and in the truck. Pt does not have a hx of SZ but has HTN. Family claimed that he has been out of his BP medicine x 3 months. Pt has a hematoma to his forehead. Pt will open his eyes when name is called but is not verbal. Pt was given Versed 5 mg IVP PTA.

## 2011-08-08 NOTE — ED Notes (Signed)
Provided pt with beverage per MD ok

## 2011-08-08 NOTE — H&P (Signed)
PCP:   Dr. Sharyn Lull   Chief Complaint:  Seizure disorder  HPI: 56 y/o male with pmh significant for HTN, alcohol abuse, non-ischemic cardiomyopathy (s/p AICD placement), tobacco abuse and remote hx of CVA; came to the hospital secondary to seizure disorder. Patient was in post-ictal state and unable to provide any further hx. Patient's wife help with hx; and reports that he was drinking a lot the day prior to admission, but none on the day of admission (last drink about 24 hours ago). Patient was in his "man cave" and wife was in a different room reading her Bible when she heard a scream and then a bang. She ran to the patient, and found him on floor with generalized shaking activity and mouth foaming. Episode lasted about a minute and half. Seemed very confused afterwards. Called EMS and the patient apparently had another similar event shortly after their arrival. Patient received 5 mg of Versed per EMS. While I was examine the patient he was drowsy (typical post ictal state) and experienced another seizure activity; received ativan and loading dose of keppra 1G.  Patient is also a chronic alcoholic. He buys moonshine by the gallon and drinks on more or less a daily basis. Patient's last drink was yesterday morning. Denies history of prior seizure. Patient states that he "gets the shakes" when he doesn't drink but has never had a seizure in the past.  Allergies:   Allergies  Allergen Reactions  . Codeine Hives  . Penicillins Hives      Past Medical History  Diagnosis Date  . CVA (cerebral vascular accident)   . Hypertension   . CHF (congestive heart failure)   . AICD (automatic cardioverter/defibrillator) present     Past Surgical History  Procedure Date  . Cardiac defibrillator placement     Prior to Admission medications   Medication Sig Start Date End Date Taking? Authorizing Provider  carvedilol (COREG) 25 MG tablet Take 1 tablet (25 mg total) by mouth 2 (two) times daily with  a meal. 08/07/11 08/06/12 Yes Ward Givens, MD  clopidogrel (PLAVIX) 75 MG tablet Take 1 tablet (75 mg total) by mouth daily. 08/07/11 08/06/12 Yes Ward Givens, MD  folic acid (FOLVITE) 1 MG tablet Take 1 tablet (1 mg total) by mouth daily. 08/07/11 08/06/12 Yes Ward Givens, MD  potassium chloride SA (K-DUR,KLOR-CON) 20 MEQ tablet Take 1 tablet (20 mEq total) by mouth 2 (two) times daily. 08/07/11 08/06/12 Yes Ward Givens, MD  ramipril (ALTACE) 10 MG capsule Take 2 capsules (20 mg total) by mouth daily. 08/07/11 08/06/12 Yes Ward Givens, MD  rosuvastatin (CRESTOR) 10 MG tablet Take 1 tablet (10 mg total) by mouth daily. 08/07/11 08/06/12 Yes Ward Givens, MD  thiamine (VITAMIN B-1) 100 MG tablet Take 1 tablet (100 mg total) by mouth daily. 08/07/11 08/06/12 Yes Ward Givens, MD    Social History:  reports that he has been smoking.  He does not have any smokeless tobacco history on file. He reports that he drinks alcohol. He reports that he does not use illicit drugs.  History reviewed. No pertinent family history.  Review of Systems:  Negative except as mentioned on HPI.   Physical Exam: Blood pressure 176/132, pulse 109, temperature 98 F (36.7 C), temperature source Oral, resp. rate 11, weight 80.74 kg (178 lb), SpO2 97.00%. Gen: drowsy; unable to follow commands. HENT:  Small hematoma in the left forehead. No significant bony tenderness. Small overlying abrasion. No active  bleeding.  Eyes: Conjunctivae are normal. Pupils are equal, round, and reactive to light. No icterus Neck: Normal range of motion. Supple. No thyromegaly. Cardiovascular: Normal rate, regular rhythm and normal heart sounds. Exam reveals no gallop and no friction rub. No murmur heard.  Pulmonary/Chest: Effort normal and breath sounds normal. No respiratory distress.  Abdominal: Soft. He exhibits no distension. There is no tenderness.  Musculoskeletal: He exhibits no edema and no tenderness.  Neurological: Patient was drowsy but  opens his eyes to voice. Unable to follow basic commands; Cranial nerves are intact. Moving 4 limbs spontaneously.  Skin: Skin is warm and dry. He is not diaphoretic.   Of note Patient experienced seizure episode after PE performed; becomes rigid; positive trismus and drooling; increased generalized  muscle tone; BP and HR also increased during episode. Patient was really obtunded after episode; but maintaining airways.    Labs on Admission:  Results for orders placed during the hospital encounter of 08/08/11 (from the past 48 hour(s))  CBC     Status: Abnormal   Collection Time   08/08/11  1:40 PM      Component Value Range Comment   WBC 4.3  4.0 - 10.5 (K/uL)    RBC 3.86 (*) 4.22 - 5.81 (MIL/uL)    Hemoglobin 12.0 (*) 13.0 - 17.0 (g/dL)    HCT 16.1 (*) 09.6 - 52.0 (%)    MCV 89.9  78.0 - 100.0 (fL)    MCH 31.1  26.0 - 34.0 (pg)    MCHC 34.6  30.0 - 36.0 (g/dL)    RDW 04.5 (*) 40.9 - 15.5 (%)    Platelets 80 (*) 150 - 400 (K/uL)   BASIC METABOLIC PANEL     Status: Abnormal   Collection Time   08/08/11  1:40 PM      Component Value Range Comment   Sodium 137  135 - 145 (mEq/L) DELTA CHECK NOTED   Potassium 3.1 (*) 3.5 - 5.1 (mEq/L)    Chloride 90 (*) 96 - 112 (mEq/L) DELTA CHECK NOTED   CO2 22  19 - 32 (mEq/L)    Glucose, Bld 91  70 - 99 (mg/dL)    BUN 8  6 - 23 (mg/dL)    Creatinine, Ser 8.11  0.50 - 1.35 (mg/dL)    Calcium 9.3  8.4 - 10.5 (mg/dL)    GFR calc non Af Amer >90  >90 (mL/min)    GFR calc Af Amer >90  >90 (mL/min)   MAGNESIUM     Status: Abnormal   Collection Time   08/08/11  1:40 PM      Component Value Range Comment   Magnesium 1.1 (*) 1.5 - 2.5 (mg/dL)   ETHANOL     Status: Normal   Collection Time   08/08/11  3:29 PM      Component Value Range Comment   Alcohol, Ethyl (B) <11  0 - 11 (mg/dL)   CK     Status: Normal   Collection Time   08/08/11  3:29 PM      Component Value Range Comment   Total CK 144  7 - 232 (U/L)   URINALYSIS, ROUTINE W REFLEX  MICROSCOPIC     Status: Abnormal   Collection Time   08/08/11  4:00 PM      Component Value Range Comment   Color, Urine YELLOW  YELLOW     APPearance CLEAR  CLEAR     Specific Gravity, Urine 1.013  1.005 - 1.030  pH 6.5  5.0 - 8.0     Glucose, UA NEGATIVE  NEGATIVE (mg/dL)    Hgb urine dipstick LARGE (*) NEGATIVE     Bilirubin Urine NEGATIVE  NEGATIVE     Ketones, ur >80 (*) NEGATIVE (mg/dL)    Protein, ur 952 (*) NEGATIVE (mg/dL)    Urobilinogen, UA 1.0  0.0 - 1.0 (mg/dL)    Nitrite NEGATIVE  NEGATIVE     Leukocytes, UA NEGATIVE  NEGATIVE    URINE RAPID DRUG SCREEN (HOSP PERFORMED)     Status: Abnormal   Collection Time   08/08/11  4:00 PM      Component Value Range Comment   Opiates NONE DETECTED  NONE DETECTED     Cocaine NONE DETECTED  NONE DETECTED     Benzodiazepines POSITIVE (*) NONE DETECTED     Amphetamines NONE DETECTED  NONE DETECTED     Tetrahydrocannabinol NONE DETECTED  NONE DETECTED     Barbiturates NONE DETECTED  NONE DETECTED    URINE MICROSCOPIC-ADD ON     Status: Normal   Collection Time   08/08/11  4:00 PM      Component Value Range Comment   Squamous Epithelial / LPF RARE  RARE     WBC, UA 0-2  <3 (WBC/hpf)    RBC / HPF 0-2  <3 (RBC/hpf)    Bacteria, UA RARE  RARE      Radiological Exams on Admission: Dg Chest 2 View  08/07/2011  *RADIOLOGY REPORT*  Clinical Data:  Chest pain and shortness of breath.  CHEST - 2 VIEW  Comparison: Chest x-ray 12/01/2010.  Findings: Lung volumes are normal.  No consolidative airspace disease.  No pleural effusions.  No pneumothorax.  No pulmonary nodule or mass noted.  Pulmonary vasculature and the cardiomediastinal silhouette are within normal limits.  A right- sided AICD is in place with lead tip projecting over the expected location of the right ventricular apex.  IMPRESSION: No radiographic evidence of acute cardiopulmonary disease.  Original Report Authenticated By: Florencia Reasons, M.D.   Ct Head Wo  Contrast  08/08/2011  *RADIOLOGY REPORT*  Clinical Data: Seizure.  Chronic ethanol abuse.  Uncontrolled hypertension.  CT HEAD WITHOUT CONTRAST  Technique:  Contiguous axial images were obtained from the base of the skull through the vertex without contrast.  Comparison: 12/03/2010  Findings: Mild soft tissue swelling over the left side of the forehead. There is atrophy and chronic small vessel disease changes.  Old left occipital infarct, stable. No acute intracranial abnormality.  Specifically, no hemorrhage, hydrocephalus, mass lesion, acute infarction, or significant intracranial injury.  No acute calvarial abnormality. Visualized paranasal sinuses and mastoids clear.  Orbital soft tissues unremarkable.  IMPRESSION: No acute intracranial abnormality.  Atrophy, chronic microvascular disease.  Old left occipital infarct.  Original Report Authenticated By: Cyndie Chime, M.D.     Assessment/Plan 1-Seizure disorder: will admit to step down; check prolactin level; and CK total. Will also order EEG; use PRN ativan and consult neurology to determine long term treatment if needed. Alcohol most likely reason for his seizure activity. Will keep NPO for now to decrease risk of aspiration.  2-Alcohol abuse and Tobacco abuse: will consult social worker for cessation and is wanted detox placement. Start thiamine, folic acid and follow CIWA. Will also start nicotine patch.  3-HTN (hypertension): will use hydralazine IV q6h scheduled for BP control.  4-Systolic CHF: close I'S and O's and daily weight; no signs of fluid overload.  5-H/O: CVA (  cardiovascular accident): plan is to resume plavix when taking PO again.  6-AICD (automatic cardioverter/defibrillator) present  7-Low magnesium levels and hypokalemia: will replete. Follow levels in am.  8-thrombocytopenia: most likely secondary to alcohol; will avoid heparin products.  9-DVT and GI: SCD's and PPI.     Time Spent on Admission: 50  minutes Lutie Pickler [facility] [phone]  08/08/2011, 5:50 PM

## 2011-08-08 NOTE — ED Notes (Signed)
Explained patient and family member we need a urine sample.  Asked patient if he would be able to urinate with me helping him with a urinal.  Patient said not now.  Will check back later.

## 2011-08-08 NOTE — ED Notes (Signed)
Again attempted to call report to [phone].  On hold for 10 minutes.

## 2011-08-08 NOTE — ED Notes (Signed)
Pt's HR up to 179 during seizure.  Pt now post ictal but maintaining airway.  NA at bedside for suction.

## 2011-08-08 NOTE — ED Notes (Signed)
Pt currently post ictal.  Pt sleep, respirations even and unlabored.  Pt not responding to voice at this time.  Wife at bedside.  Pt remains on 2L O2.

## 2011-08-08 NOTE — ED Notes (Signed)
Attempted to call report; RN unable to take report at this time.

## 2011-08-08 NOTE — ED Provider Notes (Signed)
History    56 year old male presenting after seizure-like activity. Patient was in his "man cave" and wife was in a different room reading her Bible when she heard a scream and then a bang. She ran to the patient, and found him on floot with generalized shaking activity. Episode lasted about a minute and half. Seemed very confused afterwards. Called EMS and the patient apparently had another similar event shortly after their arrival. Patient did receive 5 mg of Versed per EMS. On my examination patient is very drowsy but does awaken eyes to voice and does answer questions appropriate. Patient has no reported seizure activity. He does have a significant heart history and has been noncompliant with his medications for the past couple months because has been out. Patient is also a chronic alcoholic. He buys moonshine by the gallon and drinks on more or less a daily basis. Patient's last drink was yesterday morning. Denies history of prior seizure. Patient states that he "gets the shakes" when he doesn't drink but has never had a seizure as far as he is aware.   CSN: [id number]  Arrival date & time 08/08/11  1245   First MD Initiated Contact with Patient 08/08/11 1302      Chief Complaint  Patient presents with  . Seizures    (Consider location/radiation/quality/duration/timing/severity/associated sxs/prior treatment) HPI  Past Medical History  Diagnosis Date  . CVA (cerebral vascular accident)   . Hypertension   . CHF (congestive heart failure)   . AICD (automatic cardioverter/defibrillator) present     Past Surgical History  Procedure Date  . Cardiac defibrillator placement     History reviewed. No pertinent family history.  History  Substance Use Topics  . Smoking status: Current Everyday Smoker -- 1.0 packs/day  . Smokeless tobacco: Not on file  . Alcohol Use: Yes      Review of Systems   Review of symptoms negative unless otherwise noted in HPI.   Allergies  Codeine  and Penicillins  Home Medications   Current Outpatient Rx  Name Route Sig Dispense Refill  . CARVEDILOL 25 MG PO TABS Oral Take 1 tablet (25 mg total) by mouth 2 (two) times daily with a meal. 60 tablet 0  . CLOPIDOGREL BISULFATE 75 MG PO TABS Oral Take 1 tablet (75 mg total) by mouth daily. 30 tablet 0  . FOLIC ACID 1 MG PO TABS Oral Take 1 tablet (1 mg total) by mouth daily. 30 tablet 0  . POTASSIUM CHLORIDE CRYS ER 20 MEQ PO TBCR Oral Take 1 tablet (20 mEq total) by mouth 2 (two) times daily. 12 tablet 0  . RAMIPRIL 10 MG PO CAPS Oral Take 2 capsules (20 mg total) by mouth daily. 60 capsule 0  . ROSUVASTATIN CALCIUM 10 MG PO TABS Oral Take 1 tablet (10 mg total) by mouth daily. 30 tablet 0  . VITAMIN B-1 100 MG PO TABS Oral Take 1 tablet (100 mg total) by mouth daily. 30 tablet 0    BP 157/107  Pulse 99  Resp 14  Wt 178 lb (80.74 kg)  SpO2 100%  Physical Exam  Nursing note and vitals reviewed. Constitutional: He appears well-developed and well-nourished. No distress.  HENT:       Small hematoma in the left forehead. No significant bony tenderness. Small overlying abrasion. No active bleeding.  Eyes: Conjunctivae are normal. Pupils are equal, round, and reactive to light. Right eye exhibits no discharge. Left eye exhibits no discharge.  Neck: Normal range of motion.  Neck supple.  Cardiovascular: Normal rate, regular rhythm and normal heart sounds.  Exam reveals no gallop and no friction rub.   No murmur heard. Pulmonary/Chest: Effort normal and breath sounds normal. No respiratory distress.  Abdominal: Soft. He exhibits no distension. There is no tenderness.  Musculoskeletal: He exhibits no edema and no tenderness.       No midline spinal tenderness  Neurological: He exhibits normal muscle tone.       Patient is drowsy but opens his eyes to voice. Follows basic commands. Is oriented to place and self. Cranial nerves are intact. Strength is 5 out of 5 upper lower extremities.     Skin: Skin is warm and dry. He is not diaphoretic.  Psychiatric: He has a normal mood and affect. His behavior is normal. Thought content normal.    ED Course  Procedures (including critical care time)  Labs Reviewed  CBC - Abnormal; Notable for the following:    RBC 3.86 (*)    Hemoglobin 12.0 (*)    HCT 34.7 (*)    RDW 15.9 (*)    Platelets 80 (*)    All other components within normal limits  BASIC METABOLIC PANEL - Abnormal; Notable for the following:    Potassium 3.1 (*)    Chloride 90 (*) DELTA CHECK NOTED   All other components within normal limits  MAGNESIUM - Abnormal; Notable for the following:    Magnesium 1.1 (*)    All other components within normal limits  URINALYSIS, ROUTINE W REFLEX MICROSCOPIC - Abnormal; Notable for the following:    Hgb urine dipstick LARGE (*)    Ketones, ur >80 (*)    Protein, ur 100 (*)    All other components within normal limits  URINE RAPID DRUG SCREEN (HOSP PERFORMED) - Abnormal; Notable for the following:    Benzodiazepines POSITIVE (*)    All other components within normal limits  ETHANOL  CK  URINE MICROSCOPIC-ADD ON  PROLACTIN   Dg Chest 2 View  08/07/2011  *RADIOLOGY REPORT*  Clinical Data:  Chest pain and shortness of breath.  CHEST - 2 VIEW  Comparison: Chest x-ray 12/01/2010.  Findings: Lung volumes are normal.  No consolidative airspace disease.  No pleural effusions.  No pneumothorax.  No pulmonary nodule or mass noted.  Pulmonary vasculature and the cardiomediastinal silhouette are within normal limits.  A right- sided AICD is in place with lead tip projecting over the expected location of the right ventricular apex.  IMPRESSION: No radiographic evidence of acute cardiopulmonary disease.  Original Report Authenticated By: Florencia Reasons, M.D.   Ct Head Wo Contrast  08/08/2011  *RADIOLOGY REPORT*  Clinical Data: Seizure.  Chronic ethanol abuse.  Uncontrolled hypertension.  CT HEAD WITHOUT CONTRAST  Technique:  Contiguous  axial images were obtained from the base of the skull through the vertex without contrast.  Comparison: 12/03/2010  Findings: Mild soft tissue swelling over the left side of the forehead. There is atrophy and chronic small vessel disease changes.  Old left occipital infarct, stable. No acute intracranial abnormality.  Specifically, no hemorrhage, hydrocephalus, mass lesion, acute infarction, or significant intracranial injury.  No acute calvarial abnormality. Visualized paranasal sinuses and mastoids clear.  Orbital soft tissues unremarkable.  IMPRESSION: No acute intracranial abnormality.  Atrophy, chronic microvascular disease.  Old left occipital infarct.  Original Report Authenticated By: Cyndie Chime, M.D.   EKG:  Rhythm: Sinus tachycardia Rate: 10 7 Axis: Normal axis Intervals: Left ventricular hypertrophy. LAE. ST segments:  Repolarization abnormalities likely secondary to LVH.   1. Alcohol abuse   2. Seizure   3. Hypomagnesemia   4. Hypokalemia       MDM  56 year old male with with new onset seizure. This is more than likely secondary to alcohol withdrawal. The patient is a chronic alcoholic and drinks a daily basis. Has not had a drink since yesterday morning. Tachycardia and hypertension also at least somewhat related to alcohol withdrawal as well. Patient is drowsy, he otherwise has a nonfocal neurological examination.  Patient also with electrolyte abnormalities including hypokalemia and hypomagnesemia. Will replete. Patient will require admission for observation and further treatment.        Raeford Razor, MD 08/08/11 [phone]

## 2011-08-08 NOTE — ED Notes (Signed)
Pt continues sleeping, opening eyes occasionally.  Pt maintaining airway.  Respirations even and unlabored.

## 2011-08-08 NOTE — ED Notes (Signed)
Pt had approx 86m grand mal seizure at 1715.  Admitting MD was at bedside at the time.  Gave 1mg  of Ativan IV per MD [facility] verbal order.  Called pharmacy for Keppra order.

## 2011-08-09 ENCOUNTER — Other Ambulatory Visit (HOSPITAL_COMMUNITY): Payer: Medicare HMO

## 2011-08-09 LAB — BASIC METABOLIC PANEL
BUN: 7 mg/dL (ref 6–23)
CO2: 25 mEq/L (ref 19–32)
Calcium: 8.9 mg/dL (ref 8.4–10.5)
Chloride: 89 mEq/L — ABNORMAL LOW (ref 96–112)
Creatinine, Ser: 0.89 mg/dL (ref 0.50–1.35)
GFR calc Af Amer: >90 mL/min (ref >90)
GFR calc non Af Amer: >90 mL/min (ref >90)
Glucose, Bld: 88 mg/dL (ref 70–99)
Potassium: 3.1 mEq/L — ABNORMAL LOW (ref 3.5–5.1)
Sodium: 135 mEq/L (ref 135–145)

## 2011-08-09 LAB — LIPID PANEL
Cholesterol: 249 mg/dL — ABNORMAL HIGH (ref 0–200)
HDL: 96 mg/dL (ref >39)
LDL Cholesterol: 136 mg/dL — ABNORMAL HIGH (ref 0–99)
Total CHOL/HDL Ratio: 2.6 RATIO
Triglycerides: 83 mg/dL (ref <150)
VLDL: 17 mg/dL (ref 0–40)

## 2011-08-09 LAB — CBC
HCT: 34.9 % — ABNORMAL LOW (ref 39.0–52.0)
Hemoglobin: 12.2 g/dL — ABNORMAL LOW (ref 13.0–17.0)
MCH: 31.4 pg (ref 26.0–34.0)
MCHC: 35 g/dL (ref 30.0–36.0)
MCV: 89.9 fL (ref 78.0–100.0)
Platelets: 72 10*3/uL — ABNORMAL LOW (ref 150–400)
RBC: 3.88 MIL/uL — ABNORMAL LOW (ref 4.22–5.81)
RDW: 16 % — ABNORMAL HIGH (ref 11.5–15.5)
WBC: 5.5 10*3/uL (ref 4.0–10.5)

## 2011-08-09 LAB — PHENYTOIN LEVEL, TOTAL: Phenytoin Lvl: 16.7 ug/mL (ref 10.0–20.0)

## 2011-08-09 LAB — ALBUMIN: Albumin: 4 g/dL (ref 3.5–5.2)

## 2011-08-09 LAB — VITAMIN B12: Vitamin B-12: 644 pg/mL (ref 211–911)

## 2011-08-09 LAB — TSH: TSH: 0.41 u[IU]/mL (ref 0.350–4.500)

## 2011-08-09 MED ORDER — POTASSIUM CHLORIDE CRYS ER 20 MEQ PO TBCR
20.0000 meq | EXTENDED_RELEASE_TABLET | Freq: Two times a day (BID) | ORAL | Status: DC
  Administered 2011-08-10 – 2011-08-14 (×9): 20 meq via ORAL
  Filled 2011-08-09 (×10): qty 1

## 2011-08-09 MED ORDER — HYDRALAZINE HCL 20 MG/ML IJ SOLN: 10.0000 mg | Freq: Three times a day (TID) | INTRAMUSCULAR | Status: DC | PRN

## 2011-08-09 MED ORDER — ATORVASTATIN CALCIUM 20 MG PO TABS
20.0000 mg | ORAL_TABLET | Freq: Every day | ORAL | Status: DC
  Administered 2011-08-09 – 2011-08-13 (×5): 20 mg via ORAL
  Filled 2011-08-09 (×6): qty 1

## 2011-08-09 MED ORDER — CARVEDILOL 25 MG PO TABS
25.0000 mg | ORAL_TABLET | Freq: Two times a day (BID) | ORAL | Status: DC
  Administered 2011-08-09 – 2011-08-14 (×11): 25 mg via ORAL
  Filled 2011-08-09 (×13): qty 1

## 2011-08-09 MED ORDER — PHENYTOIN SODIUM EXTENDED 100 MG PO CAPS
100.0000 mg | ORAL_CAPSULE | Freq: Three times a day (TID) | ORAL | Status: DC
  Administered 2011-08-09 – 2011-08-14 (×16): 100 mg via ORAL
  Filled 2011-08-09 (×18): qty 1

## 2011-08-09 MED ORDER — POTASSIUM CHLORIDE CRYS ER 20 MEQ PO TBCR
40.0000 meq | EXTENDED_RELEASE_TABLET | ORAL | Status: AC
  Administered 2011-08-09 (×3): 40 meq via ORAL
  Filled 2011-08-09 (×3): qty 2

## 2011-08-09 MED ORDER — RAMIPRIL 10 MG PO CAPS
20.0000 mg | ORAL_CAPSULE | Freq: Every day | ORAL | Status: DC
  Administered 2011-08-09 – 2011-08-14 (×6): 20 mg via ORAL
  Filled 2011-08-09 (×6): qty 2

## 2011-08-09 MED ORDER — CLOPIDOGREL BISULFATE 75 MG PO TABS
75.0000 mg | ORAL_TABLET | Freq: Every day | ORAL | Status: DC
  Administered 2011-08-09 – 2011-08-14 (×6): 75 mg via ORAL
  Filled 2011-08-09 (×7): qty 1

## 2011-08-09 MED ORDER — LORAZEPAM 2 MG/ML IJ SOLN
1.0000 mg | INTRAMUSCULAR | Status: DC | PRN
  Administered 2011-08-10 (×2): 1 mg via INTRAVENOUS
  Filled 2011-08-09: qty 1

## 2011-08-09 MED ORDER — SODIUM CHLORIDE 0.9 % IV SOLN: 250.0000 mL | INTRAVENOUS | Status: DC | PRN

## 2011-08-09 MED ORDER — PANTOPRAZOLE SODIUM 40 MG PO TBEC
40.0000 mg | DELAYED_RELEASE_TABLET | Freq: Two times a day (BID) | ORAL | Status: DC
  Administered 2011-08-09 – 2011-08-14 (×10): 40 mg via ORAL
  Filled 2011-08-09 (×7): qty 1

## 2011-08-09 MED ORDER — CHLORDIAZEPOXIDE HCL 25 MG PO CAPS: 25.0000 mg | ORAL_CAPSULE | Freq: Two times a day (BID) | ORAL | Status: DC

## 2011-08-09 MED ORDER — MAGNESIUM CHLORIDE 64 MG PO TBEC
1.0000 | DELAYED_RELEASE_TABLET | Freq: Two times a day (BID) | ORAL | Status: DC
  Administered 2011-08-09 – 2011-08-14 (×11): 64 mg via ORAL
  Filled 2011-08-09 (×12): qty 1

## 2011-08-09 NOTE — Progress Notes (Addendum)
Subjective: AAOX2; reports some epigastric discomfort; able to follow commands; no febrile and w/o SOB.  Objective: Vital signs in last 24 hours: Temp:  [97.8 F (36.6 C)-100.8 F (38.2 C)] 99.5 F (37.5 C) (03/15 0725) Pulse Rate:  [90-166] 140  (03/15 0800) Resp:  [10-19] 15  (03/15 0800) BP: (116-224)/(86-166) 158/89 mmHg (03/15 0800) SpO2:  [89 %-100 %] 100 % (03/15 0800) Weight:  [72.3 kg (159 lb 6.3 oz)-80.74 kg (178 lb)] 72.3 kg (159 lb 6.3 oz) (03/15 0500) Weight change:     Intake/Output from previous day: 03/14 0701 - 03/15 0700 In: 450 [I.V.:450] Out: 1400 [Urine:1400]     Physical Exam: General: Alert, awake, oriented x2, in no acute distress. HEENT: No bruits, no goiter. Heart: Tachycardia; without murmurs, rubs, gallops. Lungs: Clear to auscultation bilaterally. Abdomen: Soft, nontender, nondistended, positive bowel sounds. Extremities: No clubbing, cyanosis or edema with positive pedal pulses. Neuro: nonfocal.    Lab Results: Basic Metabolic Panel:  Basename 08/09/11 0500 08/08/11 2215 08/08/11 1340  NA 135 -- 137  K 3.1* -- 3.1*  CL 89* -- 90*  CO2 25 -- 22  GLUCOSE 88 -- 91  BUN 7 -- 8  CREATININE 0.89 -- 0.83  CALCIUM 8.9 -- 9.3  MG -- -- 1.1*  PHOS -- 1.2* --   Liver Function Tests:  Basename 08/09/11 0500 08/07/11 0850  AST -- 222*  ALT -- 141*  ALKPHOS -- 117  BILITOT -- 0.5  PROT -- 7.2  ALBUMIN 4.0 3.9   CBC:  Basename 08/09/11 0500 08/08/11 1340 08/07/11 0850  WBC 5.5 4.3 --  NEUTROABS -- -- 1.7  HGB 12.2* 12.0* --  HCT 34.9* 34.7* --  MCV 89.9 89.9 --  PLT 72* 80* --   Cardiac Enzymes:  Basename 08/08/11 1529  CKTOTAL 144  CKMB --  CKMBINDEX --  TROPONINI --   BNP:  Basename 08/07/11 0851  PROBNP 59.8   Fasting Lipid Panel:  Basename 08/09/11 0500  CHOL 249*  HDL 96  LDLCALC 136*  TRIG 83  CHOLHDL 2.6  LDLDIRECT --   Coagulation:  Basename 08/07/11 0850  LABPROT 13.5  INR 1.01   Urine Drug  Screen: Drugs of Abuse     Component Value Date/Time   LABOPIA NONE DETECTED 08/08/2011 1600   COCAINSCRNUR NONE DETECTED 08/08/2011 1600   LABBENZ POSITIVE* 08/08/2011 1600   AMPHETMU NONE DETECTED 08/08/2011 1600   THCU NONE DETECTED 08/08/2011 1600   LABBARB NONE DETECTED 08/08/2011 1600    Alcohol Level:  Basename 08/08/11 1529 08/07/11 0850  ETH <11 371*   Urinalysis:  Basename 08/08/11 1600  COLORURINE YELLOW  LABSPEC 1.013  PHURINE 6.5  GLUCOSEU NEGATIVE  HGBUR LARGE*  BILIRUBINUR NEGATIVE  KETONESUR >80*  PROTEINUR 100*  UROBILINOGEN 1.0  NITRITE NEGATIVE  LEUKOCYTESUR NEGATIVE   Misc. Labs:  Recent Results (from the past 240 hour(s))  MRSA PCR SCREENING     Status: Normal   Collection Time   08/08/11  8:04 PM      Component Value Range Status Comment   MRSA by PCR NEGATIVE  NEGATIVE  Final     Studies/Results: Ct Head Wo Contrast  08/08/2011  *RADIOLOGY REPORT*  Clinical Data: Seizure.  Chronic ethanol abuse.  Uncontrolled hypertension.  CT HEAD WITHOUT CONTRAST  Technique:  Contiguous axial images were obtained from the base of the skull through the vertex without contrast.  Comparison: 12/03/2010  Findings: Mild soft tissue swelling over the left side of the forehead. There  is atrophy and chronic small vessel disease changes.  Old left occipital infarct, stable. No acute intracranial abnormality.  Specifically, no hemorrhage, hydrocephalus, mass lesion, acute infarction, or significant intracranial injury.  No acute calvarial abnormality. Visualized paranasal sinuses and mastoids clear.  Orbital soft tissues unremarkable.  IMPRESSION: No acute intracranial abnormality.  Atrophy, chronic microvascular disease.  Old left occipital infarct.  Original Report Authenticated By: Cyndie Chime, M.D.    Medications: Scheduled Meds:   . antiseptic oral rinse  15 mL Mouth Rinse BID  . hydrALAZINE  5 mg Intravenous Q6H  . influenza  inactive virus vaccine  0.5 mL  Intramuscular Tomorrow-1000  . levetiracetam  500 mg Intravenous Once  . levetiracetam  500 mg Intravenous Q12H  . LORazepam  0-4 mg Intravenous Q6H   Followed by  . LORazepam  0-4 mg Intravenous Q12H  . magnesium sulfate  2 g Intravenous Once  . nicotine  14 mg Transdermal Daily  . pantoprazole (PROTONIX) IV  40 mg Intravenous QHS  . phenytoin (DILANTIN) IV  1,000 mg Intravenous Once  . phenytoin (DILANTIN) IV  100 mg Intravenous Q8H  . pneumococcal 23 valent vaccine  0.5 mL Intramuscular Tomorrow-1000  . potassium chloride  10 mEq Intravenous Once  . general admission iv infusion   Intravenous Once  . sodium chloride  1,000 mL Intravenous Once  . sodium chloride  3 mL Intravenous Q12H  . thiamine  100 mg Intravenous Daily  . DISCONTD: folic acid (FOLVITE) IVPB  1 mg Intravenous Once  . DISCONTD: levetiracetam  1,000 mg Intravenous Once  . DISCONTD: levetiracetam  500 mg Intravenous STAT  . DISCONTD: phenytoin (DILANTIN) IV  1,200 mg Intravenous Once   Continuous Infusions:  PRN Meds:.sodium chloride, acetaminophen, acetaminophen, LORazepam, ondansetron (ZOFRAN) IV, ondansetron, sodium chloride, DISCONTD: LORazepam  Assessment/Plan: 1-Seizure disorder: will continue dilantin TID as per neurology recommendations. No need for EGD. Will also check MRI/MRA to r/o any new stroke (that could precipitate seizures). PT/OT evaluation and continue CIWA.  2-Toxic encephalopathy: secondary to post ictal and medications (especially benzos and antiepileptics); improved.   3-Alcohol abuse: counseling provided; but patient still somnolent and lethargic; will wait when more alert to offer detox. Continue CIWA and use PRN ativan. Sitter at bedside. Will check B12 level.  4-Tobacco abuse: continue nicotine patch.  5-Accelerated HTN (hypertension): resume home meds and continue PRN hydralazine.  6-Systolic CHF: chronic and compensated; will resume home medication regimen and will follow I'S and O's  and daily weights.  7-H/O: CVA (cardiovascular accident): repeat MRI/MRA as per neurology recommendations (patient was noncompliant). Restart plavix.  8-Low magnesium levels and Hypokalemia: replete and follow trend.  9-GERD: continue PPI.  10-DVT: SCD's    LOS: 1 day   Yatzari Jonsson [facility] [phone]  08/09/2011, 9:18 AM

## 2011-08-09 NOTE — Consult Note (Addendum)
Subjective: Patient is doing well.   Objective: Vital signs in last 24 hours: Temp:  [97.8 F (36.6 C)-100.8 F (38.2 C)] 99.5 F (37.5 C) (03/15 0725) Pulse Rate:  [90-166] 126  (03/15 0725) Resp:  [10-19] 14  (03/15 0725) BP: (116-224)/(86-166) 127/91 mmHg (03/15 0401) SpO2:  [89 %-100 %] 99 % (03/15 0725) Weight:  [72.3 kg (159 lb 6.3 oz)-80.74 kg (178 lb)] 72.3 kg (159 lb 6.3 oz) (03/15 0500)  Intake/Output from previous day: 03/14 0701 - 03/15 0700 In: 450 [I.V.:450] Out: 1400 [Urine:1400]  Nutritional status: NPO  Neurological exam: AAO*2. No aphasia. Followed simple commands. Cranial nerves: EOMI, PERRL. Right field cut. Sensation to V1 through V3 areas of the face was intact and symmetric throughout. There was no facial asymmetry. Shoulder shrug was 5/5 and symmetric bilaterally. Head rotation was 5/5 bilaterally. There was no dysarthria or palatal deviation. Motor: strength was grossly intact everywhere except proximal LUE, which may be pain limited due to fall on his shoulder during seizure event. Sensory: was intact throughout to light touch. Coordination: finger-to-nose were intact and symmetric bilaterally. Reflexes: were 1+ in upper extremities and 1+ at the knees and 1+ at the ankles. Plantar response was downgoing bilaterally. Gait: deferred. Patient was noted to have some cogwheel rigidity and mild tremor resting and positional tremor  Lab Results:  Basename 08/09/11 0500 08/08/11 1340  WBC 5.5 4.3  HGB 12.2* 12.0*  HCT 34.9* 34.7*  PLT 72* 80*  NA 135 137  K 3.1* 3.1*  CL 89* 90*  CO2 25 22  GLUCOSE 88 91  BUN 7 8  CREATININE 0.89 0.83  CALCIUM 8.9 9.3  LABA1C -- --   Lipid Panel  Basename 08/09/11 0500  CHOL 249*  TRIG 83  HDL 96  CHOLHDL 2.6  VLDL 17  LDLCALC 409*   Studies/Results: Dg Chest 2 View  08/07/2011  *RADIOLOGY REPORT*  Clinical Data:  Chest pain and shortness of breath.  CHEST - 2 VIEW  Comparison: Chest x-ray 12/01/2010.  Findings:  Lung volumes are normal.  No consolidative airspace disease.  No pleural effusions.  No pneumothorax.  No pulmonary nodule or mass noted.  Pulmonary vasculature and the cardiomediastinal silhouette are within normal limits.  A right- sided AICD is in place with lead tip projecting over the expected location of the right ventricular apex.  IMPRESSION: No radiographic evidence of acute cardiopulmonary disease.  Original Report Authenticated By: Florencia Reasons, M.D.   Ct Head Wo Contrast  08/08/2011  *RADIOLOGY REPORT*  Clinical Data: Seizure.  Chronic ethanol abuse.  Uncontrolled hypertension.  CT HEAD WITHOUT CONTRAST  Technique:  Contiguous axial images were obtained from the base of the skull through the vertex without contrast.  Comparison: 12/03/2010  Findings: Mild soft tissue swelling over the left side of the forehead. There is atrophy and chronic small vessel disease changes.  Old left occipital infarct, stable. No acute intracranial abnormality.  Specifically, no hemorrhage, hydrocephalus, mass lesion, acute infarction, or significant intracranial injury.  No acute calvarial abnormality. Visualized paranasal sinuses and mastoids clear.  Orbital soft tissues unremarkable.  IMPRESSION: No acute intracranial abnormality.  Atrophy, chronic microvascular disease.  Old left occipital infarct.  Original Report Authenticated By: Cyndie Chime, M.D.   Medications: I have reviewed the patient's current medications.  Assessment/Plan: 56 years old man s/p 3 GTS seizures who is now on Dilantin with therapeutic level of 16.0  1) no MRI/MRA brain due to defibrillator 2) Alcohol withdrawal precautions 3) Passed bedside  water swallow eval - awaiting an official bedside swallow eval 4) Does not need EEG, has sufficient history to treat regardless of the results of the test 5) Will sign off if MRI shown no acute CVA - to follow-up with outpatient neurology regarding further seizure management. Patient and  wife instructed not to drive, take baths or swim in pools alone and operate heavy machinery.  6) When he is no longer at risk for withdrawal, he may need rehab and Sinemet 25/100 tid can be tried to see if his balance improves.   LOS: 1 day   [name] Phillips

## 2011-08-09 NOTE — Clinical Documentation Improvement (Signed)
Hypertension Documentation Clarification Query  THIS DOCUMENT IS NOT A PERMANENT PART OF THE MEDICAL RECORD  TO RESPOND TO THE THIS QUERY, FOLLOW THE INSTRUCTIONS BELOW:  1. If needed, update documentation for the patient's encounter via the notes activity.  2. Access this query again and click edit on the In [employer].  3. After updating, or not, click F2 to complete all highlighted (required) fields concerning your review. Select "additional documentation in the medical record" OR "no additional documentation provided".  4. Click Sign note button.  5. The deficiency will fall out of your In Basket *Please let us know if you are not able to complete this workflow by phone or e-mail (listed below). Please update documentation in the clinical record as appropriate. Thanks.         08/09/11  Dear Dr. Vassie Loll Marton Redwood  In an effort to better capture your patient's severity of illness, reflect appropriate length of stay and utilization of resources, a review of the patient medical record has revealed the following indicators Based on your clinical judgment, please clarify and document in a progress note and/or discharge summary the clinical condition associated with the following supporting information: In responding to this query please exercise your independent judgment.  The fact that a query is asked, does not imply that any particular answer is desired or expected.  Possible Clinical Conditions  Accelerated Hypertension  Malignant Hypertension  Or Other Condition   Cannot Clinically Determine   Supporting Information:  Risk Factors: alcohol withdrawal  Signs and Symptoms: SBP range:224-146 DBP range: 166-107 Encephalopathy?  Treatment: hydralazine prn  You may use possible, probable, or suspect with inpatient documentation. Possible, probable, suspected diagnoses MUST be documented at the time of discharge.  Reviewed: accelerated HTN due to not been able to  take regular meds. Will change notes.  Thank You,  Amada Kingfisher RN, BSN, CCM   Clinical Documentation Specialist: [phone] [email or web address]   Health Information Management [place]

## 2011-08-09 NOTE — Clinical Documentation Improvement (Signed)
CHANGE MENTAL STATUS DOCUMENTATION CLARIFICATION   THIS DOCUMENT IS NOT A PERMANENT PART OF THE MEDICAL RECORD  TO RESPOND TO THE THIS QUERY, FOLLOW THE INSTRUCTIONS BELOW:  1. If needed, update documentation for the patient's encounter via the notes activity.  2. Access this query again and click edit on the In [employer].  3. After updating, or not, click F2 to complete all highlighted (required) fields concerning your review. Select "additional documentation in the medical record" OR "no additional documentation provided".  4. Click Sign note button.  5. The deficiency will fall out of your In Basket *Please let us know if you are not able to complete this workflow by phone or e-mail (listed below). Please update documentation in medical record on progress note or dc summary, as appropriate         08/09/11  Dear Dr. Vassie Loll Marton Redwood  In an effort to better capture your patient's severity of illness, reflect appropriate length of stay and utilization of resources, a review of the patient medical record has revealed the following indicators. Based on your clinical judgment, please clarify and document in a progress note and/or discharge summary the clinical condition associated with the following supporting information: In responding to this query please exercise your independent judgment.  The fact that a query is asked, does not imply that any particular answer is desired or expected.  Possible Clinical Conditions?  Encephalopathy (describe type if known)                       Alcoholic                        Hypertensive                       Toxic  Other Condition  Cannot Clinically Determine   Supporting Information:  Risk Factors: Patient was last drinking last night. According to wife, he drinks a milk jug of moonshine daily and has done this for many years per progress note  Signs & Symptoms:Expressive aphasia. Did not follow commands. Did not attend to  examiner per progress note  Diagnostics: Lab: Benzodiazepines POSITIVE  Radiology CT Head:IMPRESSION: No acute intracranial abnormality. Atrophy, chronic microvascular disease. Old left occipital infarct.  Treatment:Dilantin 1G load; seizure precautions, neuro checks, ativan   Reviewed: toxic metabolic encephalopathy due to post ictal state and benzos. Diagnosis added to progress note.  Thank You,  Amada Kingfisher  RN, BSN, CCM  Clinical Documentation Specialist: [phone] [email or web address]   Health Information Management [place]

## 2011-08-09 NOTE — Progress Notes (Signed)
08/09/2011 [name] Phillips SPARKS Case Management Note [phone]  Utilization review completed.  

## 2011-08-09 NOTE — Evaluation (Signed)
Clinical/Bedside Swallow Evaluation Patient Details  Name: [name] Phillips MRN: [id number] DOB: 1955-12-16 Today's Date: 08/09/2011  Past Medical History:  Past Medical History  Diagnosis Date  . CVA (cerebral vascular accident)   . Hypertension   . CHF (congestive heart failure)   . AICD (automatic cardioverter/defibrillator) present    Past Surgical History:  Past Surgical History  Procedure Date  . Cardiac defibrillator placement    HPI:  56 yr old admitted after seizure.  Pt. is an alcoholic, old CVA. CT negative for stroke.  MRI ordered.   Assessment/Recommendations/Treatment Plan Suspected Esophageal Findings Suspected Esophageal Findings: Belching  SLP Assessment Clinical Impression Statement: Pt. exhibited mild oral dysphagia with decreased labial/lingual strength and oral manipulation and transit.  Mildly decreased laryngeal elevation with palpation with no indications of pharyngeal dysphagia.  Belching throughout evaluation with likely esophageal impairment due to significant and frequent ETOH abuse.  Given oral weakness, lethargy  a Dys 3 diet and thin liquids recommended. Risk for Aspiration: Mild Other Related Risk Factors: History of GERD  Swallow Evaluation Recommendations Solid Consistency: Dysphagia 3 (Mechanical soft) Liquid Consistency: Thin Liquid Administration via: Straw;Cup  Medication Administration: Whole meds with puree Supervision: Full supervision/cueing for compensatory strategies Compensations: Slow rate;Small sips/bites;Check for pocketing Postural Changes and/or Swallow Maneuvers: Seated upright 90 degrees;Upright 30-60 min after meal Oral Care Recommendations: Oral care BID Follow up Recommendations: None   Treatment Plan Treatment Plan Recommendations: Therapy as outlined in treatment plan below Speech Therapy Frequency: min 1 x/week Treatment Duration: 1 week Interventions: Aspiration precaution training;Compensatory  techniques;Patient/family education;Trials of upgraded texture/liquids;Diet toleration management by SLP  Prognosis Prognosis for Safe Diet Advancement: Good Barriers to Reach Goals: Cognitive deficits  Individuals Consulted Consulted and Agree with Results and Recommendations: Patient;Family member/caregiver  Swallowing Goals  SLP Swallowing Goals Patient will consume recommended diet without observed clinical signs of aspiration with: Minimal assistance Patient will utilize recommended strategies during swallow to increase swallowing safety with: Minimal assistance  Swallow Study Prior Functional Status     General  HPI: 56 yr old admitted after seizure.  Pt. is an alcoholic, old CVA. CT negative for stroke.  MRI ordered. Type of Study: Bedside swallow evaluation Diet Prior to this Study: NPO Respiratory Status: Supplemental O2 delivered via (comment) Behavior/Cognition: Cooperative;Confused;Lethargic Oral Cavity - Dentition: Dentures, top;Dentures, bottom Patient Positioning: Upright in bed Volitional Swallow: Able to elicit  Oral Motor/Sensory Function  Overall Oral Motor/Sensory Function: Impaired Labial ROM:  (generalized weakness) Labial Symmetry: Within Functional Limits Labial Strength: Reduced Lingual ROM: Within Functional Limits Lingual Symmetry: Within Functional Limits Lingual Strength: Reduced Facial ROM: Within Functional Limits Facial Symmetry: Within Functional Limits Mandible: Within Functional Limits  Consistency Results  Ice Chips Ice chips: Not tested  Thin Liquid Thin Liquid: Impaired Presentation: Straw Pharyngeal  Phase Impairments: Decreased hyoid-laryngeal movement  Nectar Thick Liquid Nectar Thick Liquid: Not tested  Honey Thick Liquid Honey Thick Liquid: Not tested  Puree Puree: Within functional limits  Solid Solid: Impaired Oral Phase Impairments: Reduced lingual movement/coordination Oral Phase Functional Implications:   (slow transit)   Royce Macadamia M.Ed [employer] [phone]  08/09/2011

## 2011-08-10 LAB — BASIC METABOLIC PANEL
BUN: 15 mg/dL (ref 6–23)
CO2: 27 mEq/L (ref 19–32)
Calcium: 9.2 mg/dL (ref 8.4–10.5)
Chloride: 96 mEq/L (ref 96–112)
Creatinine, Ser: 1.5 mg/dL — ABNORMAL HIGH (ref 0.50–1.35)
GFR calc Af Amer: 58 mL/min — ABNORMAL LOW (ref >90)
GFR calc non Af Amer: 50 mL/min — ABNORMAL LOW (ref >90)
Glucose, Bld: 98 mg/dL (ref 70–99)
Potassium: 3.6 mEq/L (ref 3.5–5.1)
Sodium: 136 mEq/L (ref 135–145)

## 2011-08-10 LAB — CBC
HCT: 32.5 % — ABNORMAL LOW (ref 39.0–52.0)
Hemoglobin: 11.2 g/dL — ABNORMAL LOW (ref 13.0–17.0)
MCH: 31.1 pg (ref 26.0–34.0)
MCHC: 34.5 g/dL (ref 30.0–36.0)
MCV: 90.3 fL (ref 78.0–100.0)
Platelets: 55 10*3/uL — ABNORMAL LOW (ref 150–400)
RBC: 3.6 MIL/uL — ABNORMAL LOW (ref 4.22–5.81)
RDW: 16.1 % — ABNORMAL HIGH (ref 11.5–15.5)
WBC: 5.1 10*3/uL (ref 4.0–10.5)

## 2011-08-10 MED ORDER — VITAMIN B-1 100 MG PO TABS
100.0000 mg | ORAL_TABLET | Freq: Every day | ORAL | Status: DC
  Administered 2011-08-11 – 2011-08-14 (×4): 100 mg via ORAL
  Filled 2011-08-10 (×4): qty 1

## 2011-08-10 MED ORDER — DIPHENHYDRAMINE HCL 12.5 MG/5ML PO ELIX
12.5000 mg | ORAL_SOLUTION | Freq: Three times a day (TID) | ORAL | Status: DC | PRN
  Filled 2011-08-10: qty 5

## 2011-08-10 MED ORDER — CHLORDIAZEPOXIDE HCL 25 MG PO CAPS
25.0000 mg | ORAL_CAPSULE | Freq: Two times a day (BID) | ORAL | Status: DC
  Administered 2011-08-10 – 2011-08-14 (×9): 25 mg via ORAL
  Filled 2011-08-10 (×9): qty 1

## 2011-08-10 NOTE — Progress Notes (Signed)
Subjective: AAOX1; No febrile and w/o SOB. Patient is confused and somnolent; he was agitated in am and received 1 mg of ativan.  Objective: Vital signs in last 24 hours: Temp:  [97.7 F (36.5 C)-98.4 F (36.9 C)] 98.3 F (36.8 C) (03/16 1200) Pulse Rate:  [93-123] 93  (03/16 1200) Resp:  [16-20] 20  (03/16 1200) BP: (99-121)/(69-88) 116/81 mmHg (03/16 1200) SpO2:  [94 %-98 %] 94 % (03/16 1200) Weight change:  Last BM Date: 08/07/11  Intake/Output from previous day: 03/15 0701 - 03/16 0700 In: 243 [P.O.:240; I.V.:3] Out: 176 [Urine:175; Stool:1]     Physical Exam: General: Alert, awake, oriented x1, in no acute distress, somnolent. HEENT: No bruits, no goiter. Heart: Tachycardia; without murmurs, rubs, gallops. Lungs: Clear to auscultation bilaterally. Abdomen: Soft, nontender, nondistended, positive bowel sounds. Extremities: No clubbing, cyanosis or edema with positive pedal pulses. Neuro: nonfocal.   Lab Results: Basic Metabolic Panel:  Basename 08/10/11 0645 08/09/11 0500 08/08/11 2215 08/08/11 1340  NA 136 135 -- --  K 3.6 3.1* -- --  CL 96 89* -- --  CO2 27 25 -- --  GLUCOSE 98 88 -- --  BUN 15 7 -- --  CREATININE 1.50* 0.89 -- --  CALCIUM 9.2 8.9 -- --  MG -- -- -- 1.1*  PHOS -- -- 1.2* --   Liver Function Tests:  Basename 08/09/11 0500  AST --  ALT --  ALKPHOS --  BILITOT --  PROT --  ALBUMIN 4.0   CBC:  Basename 08/10/11 0645 08/09/11 0500  WBC 5.1 5.5  NEUTROABS -- --  HGB 11.2* 12.2*  HCT 32.5* 34.9*  MCV 90.3 89.9  PLT 55* 72*   Cardiac Enzymes:  Basename 08/08/11 1529  CKTOTAL 144  CKMB --  CKMBINDEX --  TROPONINI --   Fasting Lipid Panel:  Basename 08/09/11 0500  CHOL 249*  HDL 96  LDLCALC 136*  TRIG 83  CHOLHDL 2.6  LDLDIRECT --   Urine Drug Screen: Drugs of Abuse     Component Value Date/Time   LABOPIA NONE DETECTED 08/08/2011 1600   COCAINSCRNUR NONE DETECTED 08/08/2011 1600   LABBENZ POSITIVE* 08/08/2011 1600     AMPHETMU NONE DETECTED 08/08/2011 1600   THCU NONE DETECTED 08/08/2011 1600   LABBARB NONE DETECTED 08/08/2011 1600    Alcohol Level:  Basename 08/08/11 1529  ETH <11   Urinalysis:  Basename 08/08/11 1600  COLORURINE YELLOW  LABSPEC 1.013  PHURINE 6.5  GLUCOSEU NEGATIVE  HGBUR LARGE*  BILIRUBINUR NEGATIVE  KETONESUR >80*  PROTEINUR 100*  UROBILINOGEN 1.0  NITRITE NEGATIVE  LEUKOCYTESUR NEGATIVE   Misc. Labs:  Recent Results (from the past 240 hour(s))  MRSA PCR SCREENING     Status: Normal   Collection Time   08/08/11  8:04 PM      Component Value Range Status Comment   MRSA by PCR NEGATIVE  NEGATIVE  Final     Studies/Results: No results found.  Medications: Scheduled Meds:    . antiseptic oral rinse  15 mL Mouth Rinse BID  . atorvastatin  20 mg Oral q1800  . carvedilol  25 mg Oral BID WC  . chlordiazePOXIDE  25 mg Oral BID  . clopidogrel  75 mg Oral Q breakfast  . LORazepam  0-4 mg Intravenous Q6H   Followed by  . LORazepam  0-4 mg Intravenous Q12H  . magnesium chloride  1 tablet Oral BID  . nicotine  14 mg Transdermal Daily  . pantoprazole  40 mg  Oral BID AC  . phenytoin  100 mg Oral TID  . potassium chloride SA  20 mEq Oral BID  . potassium chloride  40 mEq Oral Q4H  . ramipril  20 mg Oral Daily  . sodium chloride  3 mL Intravenous Q12H  . thiamine  100 mg Oral Daily  . DISCONTD: thiamine  100 mg Intravenous Daily   Continuous Infusions:  PRN Meds:.sodium chloride, acetaminophen, acetaminophen, hydrALAZINE, ondansetron (ZOFRAN) IV, ondansetron, sodium chloride, DISCONTD: LORazepam  Assessment/Plan: 1-Seizure disorder: will continue dilantin TID as per neurology recommendations. MRI w/o new stroke. PT/OT evaluation and continue CIWA.  2-Toxic encephalopathy: secondary to post ictal and medications (especially benzos and antiepileptics). Will start librium and minimize the use of benzos. Follow MS improvement.  3-Alcohol abuse: counseling provided;  but patient still somnolent and lethargic; will wait when more alert to offer detox. Continue CIWA and start librium. Sitter at bedside. B12 level WNL.  4-Tobacco abuse: continue nicotine patch.  5-Accelerated HTN (hypertension): Well controlled now. Continue current regimen.  6-ARF: most likely due to normalization of BP. Will follow closely his kidney function.  7-Systolic CHF: chronic and compensated; will continue home medication regimen and follow I'S and O's and daily weights.  8-H/O: CVA (cardiovascular accident): repeat MRI/MRA as per neurology demonstrated no acute stroke. Continue plavix.  9-Low magnesium levels and Hypokalemia: repleted.  10-GERD: continue PPI.  10-DVT: SCD's    LOS: 2 days   Agastya Meister [facility] [phone]  08/10/2011, 2:54 PM

## 2011-08-10 NOTE — Evaluation (Addendum)
Physical Therapy Evaluation Patient Details Name: [name] Phillips MRN: [id number] DOB: 06/17/1955 Today's Date: 08/10/2011  Problem List:  Patient Active Problem List  Diagnoses  . Seizure disorder  . Alcohol abuse  . Tobacco abuse  . HTN (hypertension)  . Systolic CHF  . H/O: CVA (cardiovascular accident)  . AICD (automatic cardioverter/defibrillator) present  . Low magnesium levels  . Hypokalemia    Past Medical History:  Past Medical History  Diagnosis Date  . CVA (cerebral vascular accident)   . Hypertension   . CHF (congestive heart failure)   . AICD (automatic cardioverter/defibrillator) present    Past Surgical History:  Past Surgical History  Procedure Date  . Cardiac defibrillator placement     PT Assessment/Plan/Recommendation PT Assessment Clinical Impression Statement: Pt adm after seizure.  Pt limited by lethargy.  Expect mobility will improve as cognition/alertness improves.  Pt will need to be mobile to return home with wife.  Will defer DC recommendations until pt is more alert. PT Recommendation/Assessment: Patient will need skilled PT in the acute care venue PT Problem List: Decreased activity tolerance;Decreased balance;Decreased mobility;Decreased cognition PT Therapy Diagnosis : Altered mental status PT Plan PT Frequency: Min 3X/week PT Treatment/Interventions: Gait training;DME instruction;Functional mobility training;Patient/family education;Therapeutic activities;Therapeutic exercise;Balance training PT Recommendation Follow Up Recommendations: Other (comment) (Defer until pt more alert.) Equipment Recommended: Other (comment) (Defer until pt more alert.) PT Goals  Acute Rehab PT Goals PT Goal Formulation: With patient/family Time For Goal Achievement: 2 weeks Pt will go Supine/Side to Sit: with min assist PT Goal: Supine/Side to Sit - Progress: Goal set today Pt will Sit at [facility] of Bed: with supervision PT Goal: Sit at [facility] Of Bed - Progress:  Goal set today Pt will go Sit to Supine/Side: with min assist PT Goal: Sit to Supine/Side - Progress: Goal set today Pt will go Sit to Stand: with min assist PT Goal: Sit to Stand - Progress: Goal set today Pt will go Stand to Sit: with min assist PT Goal: Stand to Sit - Progress: Goal set today Pt will Ambulate: 51 - 150 feet;with min assist;with least restrictive assistive device PT Goal: Ambulate - Progress: Goal set today  PT Evaluation Precautions/Restrictions  Precautions Precautions: Fall Prior Functioning  Home Living Lives With: Spouse Home Layout: One level Home Access: Stairs to enter Entrance Stairs-Rails: None Entrance Stairs-Number of Steps: 2 Prior Function Level of Independence: Independent with basic ADLs;Independent with gait;Independent with transfers (mowing lawn) Cognition Cognition Arousal/Alertness: Lethargic Overall Cognitive Status: Impaired Orientation Level: Other (Comment) (difficult to understand pt) Following Commands: Follows one step commands inconsistently Sensation/Coordination   Extremity Assessment RLE Strength RLE Overall Strength Comments: Active movement against gravity noted but unable to formally test due to coginition. LLE Strength LLE Overall Strength Comments: Active movement against gravity noted but unable to formally test due to coginition. Mobility (including Balance) Bed Mobility Bed Mobility: Yes Supine to Sit: 1: +1 Total assist;HOB elevated (Comment degrees) (HOB 30 degrees) Supine to Sit Details (indicate cue type and reason): pt assisted with moving legs over only Sitting - Scoot to Edge of Bed: 1: +1 Total assist Sitting - Scoot to Edge of Bed Details (indicate cue type and reason): used pad to bring pt to EOB Sit to Supine: 3: Mod assist;HOB flat Sit to Supine - Details (indicate cue type and reason): Assist to lower trunk.  Pt able to bring legs up onto bed.  Static Sitting Balance Static Sitting - Balance Support:  Feet supported;No upper extremity  supported (pt fiddling with hands) Static Sitting - Level of Assistance: 3: Mod assist (pt with posterior lean) Static Sitting - Comment/# of Minutes: 10 minutes Exercise    End of Session PT - End of Session Activity Tolerance: Other (comment) (pt limited by lethargy) Patient left: in bed;with family/visitor present General Behavior During Session: April Holding 08/10/2011, 9:01 AM  Skip Mayer PT [phone]

## 2011-08-10 NOTE — Consult Note (Signed)
Subjective: Patient is sleepy and confused.  Objective: Vital signs in last 24 hours: Temp:  [97.7 F (36.5 C)-98.4 F (36.9 C)] 98.2 F (36.8 C) (03/16 0800) Pulse Rate:  [93-139] 98  (03/16 0800) Resp:  [15-20] 20  (03/16 0800) BP: (98-121)/(69-88) 121/88 mmHg (03/16 0800) SpO2:  [95 %-100 %] 96 % (03/16 0800)  Intake/Output from previous day: 03/15 0701 - 03/16 0700 In: 243 [P.O.:240; I.V.:3] Out: 176 [Urine:175; Stool:1]  Nutritional status: Dysphagia  Neurological exam: limited exam, arousable to sternal rub, agitated, moved all 4 extremities equally to pain, no facial droop, no obvious gaze deviation  Lab Results:  Basename 08/10/11 0645 08/09/11 0500  WBC 5.1 5.5  HGB 11.2* 12.2*  HCT 32.5* 34.9*  PLT 55* 72*  NA 136 135  K 3.6 3.1*  CL 96 89*  CO2 27 25  GLUCOSE 98 88  BUN 15 7  CREATININE 1.50* 0.89  CALCIUM 9.2 8.9  LABA1C -- --   Lipid Panel  Basename 08/09/11 0500  CHOL 249*  TRIG 83  HDL 96  CHOLHDL 2.6  VLDL 17  LDLCALC 409*   Studies/Results: Ct Head Wo Contrast  08/08/2011  *RADIOLOGY REPORT*  Clinical Data: Seizure.  Chronic ethanol abuse.  Uncontrolled hypertension.  CT HEAD WITHOUT CONTRAST  Technique:  Contiguous axial images were obtained from the base of the skull through the vertex without contrast.  Comparison: 12/03/2010  Findings: Mild soft tissue swelling over the left side of the forehead. There is atrophy and chronic small vessel disease changes.  Old left occipital infarct, stable. No acute intracranial abnormality.  Specifically, no hemorrhage, hydrocephalus, mass lesion, acute infarction, or significant intracranial injury.  No acute calvarial abnormality. Visualized paranasal sinuses and mastoids clear.  Orbital soft tissues unremarkable.  IMPRESSION: No acute intracranial abnormality.  Atrophy, chronic microvascular disease.  Old left occipital infarct.  Original Report Authenticated By: Cyndie Chime, M.D.   Medications: I have  reviewed the patient's current medications.  Assessment/Plan: 56 years old man with seizures in settings of chronic alcohol abuse - patient is currently on therapeutic dose of Dilantin, but still remains confused - doubt he had a new stroke, but a repeat CT head can be obtained if there is evidence of focal neurological deficits when he is more arousable 1) Alcohol withdrawal precautions 2) Seizure precautions 3) Cont. Dilantin 4) I counseled patient regarding seizure precautions including not driving - he needs outpatient neurology follow-up 5) Call with questions  LOS: 2 days   [name] Phillips

## 2011-08-11 LAB — BASIC METABOLIC PANEL
BUN: 14 mg/dL (ref 6–23)
CO2: 28 mEq/L (ref 19–32)
Calcium: 9.6 mg/dL (ref 8.4–10.5)
Chloride: 99 mEq/L (ref 96–112)
Creatinine, Ser: 1.08 mg/dL (ref 0.50–1.35)
GFR calc Af Amer: 86 mL/min — ABNORMAL LOW (ref >90)
GFR calc non Af Amer: 74 mL/min — ABNORMAL LOW (ref >90)
Glucose, Bld: 100 mg/dL — ABNORMAL HIGH (ref 70–99)
Potassium: 3.4 mEq/L — ABNORMAL LOW (ref 3.5–5.1)
Sodium: 138 mEq/L (ref 135–145)

## 2011-08-11 NOTE — Progress Notes (Signed)
Subjective: AAOX2; No fever and w/o SOB. Patient less confused and also less lethargic today. Less agitation events. Complaining of epigastric discomfort.  Objective: Vital signs in last 24 hours: Temp:  [97.9 F (36.6 C)-99.1 F (37.3 C)] 98.1 F (36.7 C) (03/17 1432) Pulse Rate:  [83-92] 87  (03/17 1432) Resp:  [18] 18  (03/17 1432) BP: (99-127)/(71-92) 99/72 mmHg (03/17 1432) SpO2:  [97 %] 97 % (03/17 1432) Weight change:  Last BM Date: 08/07/11  Intake/Output from previous day: 03/16 0701 - 03/17 0700 In: 120 [P.O.:120] Out: -      Physical Exam: General: Alert, awake, oriented x2, in no acute distress, slightly somnolent. HEENT: No bruits, no goiter. Heart: Tachycardia; without murmurs, rubs, gallops. Lungs: Clear to auscultation bilaterally. Abdomen: Soft, nontender, nondistended, positive bowel sounds. Extremities: No clubbing, cyanosis or edema with positive pedal pulses. Neuro: nonfocal.  Lab Results: Basic Metabolic Panel:  Basename 08/11/11 0701 08/10/11 0645 08/08/11 2215  NA 138 136 --  K 3.4* 3.6 --  CL 99 96 --  CO2 28 27 --  GLUCOSE 100* 98 --  BUN 14 15 --  CREATININE 1.08 1.50* --  CALCIUM 9.6 9.2 --  MG -- -- --  PHOS -- -- 1.2*   Liver Function Tests:  Basename 08/09/11 0500  AST --  ALT --  ALKPHOS --  BILITOT --  PROT --  ALBUMIN 4.0   CBC:  Basename 08/10/11 0645 08/09/11 0500  WBC 5.1 5.5  NEUTROABS -- --  HGB 11.2* 12.2*  HCT 32.5* 34.9*  MCV 90.3 89.9  PLT 55* 72*   Fasting Lipid Panel:  Basename 08/09/11 0500  CHOL 249*  HDL 96  LDLCALC 136*  TRIG 83  CHOLHDL 2.6  LDLDIRECT --   Urine Drug Screen: Drugs of Abuse     Component Value Date/Time   LABOPIA NONE DETECTED 08/08/2011 1600   COCAINSCRNUR NONE DETECTED 08/08/2011 1600   LABBENZ POSITIVE* 08/08/2011 1600   AMPHETMU NONE DETECTED 08/08/2011 1600   THCU NONE DETECTED 08/08/2011 1600   LABBARB NONE DETECTED 08/08/2011 1600    Misc. Labs:  Recent Results  (from the past 240 hour(s))  MRSA PCR SCREENING     Status: Normal   Collection Time   08/08/11  8:04 PM      Component Value Range Status Comment   MRSA by PCR NEGATIVE  NEGATIVE  Final     Studies/Results: No results found.  Medications: Scheduled Meds:    . antiseptic oral rinse  15 mL Mouth Rinse BID  . atorvastatin  20 mg Oral q1800  . carvedilol  25 mg Oral BID WC  . chlordiazePOXIDE  25 mg Oral BID  . clopidogrel  75 mg Oral Q breakfast  . LORazepam  0-4 mg Intravenous Q6H   Followed by  . LORazepam  0-4 mg Intravenous Q12H  . magnesium chloride  1 tablet Oral BID  . nicotine  14 mg Transdermal Daily  . pantoprazole  40 mg Oral BID AC  . phenytoin  100 mg Oral TID  . potassium chloride SA  20 mEq Oral BID  . ramipril  20 mg Oral Daily  . sodium chloride  3 mL Intravenous Q12H  . thiamine  100 mg Oral Daily   Continuous Infusions:  PRN Meds:.sodium chloride, acetaminophen, acetaminophen, diphenhydrAMINE, hydrALAZINE, ondansetron (ZOFRAN) IV, ondansetron, sodium chloride  Assessment/Plan: 1-Seizure disorder: will continue dilantin TID as per neurology recommendations. MRI w/o new stroke. PT/OT evaluation and continue CIWA.  2-Toxic encephalopathy:  secondary to post ictal and medications (especially benzos and antiepileptics). Continue librium and will try to continue minimizing the use of benzos. Follow mental status continue improving.  3-Alcohol abuse: counseling provided; but patient still somnolent and lethargic; will wait when more alert to offer detox. Continue CIWA and librium. Sitter at bedside. B12 level WNL.  4-Tobacco abuse: continue nicotine patch.  5-Accelerated HTN (hypertension): Well controlled now. Continue current regimen.  6-ARF: Back to normal. Will continue monitoring Cr trend.  7-Systolic CHF: chronic and compensated; will continue home medication regimen and follow I'S and O's and daily weights.  8-H/O: CVA (cardiovascular accident):  Continue plavix.  9-Low magnesium levels and Hypokalemia: Continue daily maintenance dose.  10-GERD: continue PPI.  10-DVT: SCD's    LOS: 3 days   Latorria Zeoli [facility] [phone]  08/11/2011, 5:11 PM

## 2011-08-11 NOTE — Progress Notes (Signed)
Speech Language/Pathology Speech Language Pathology Swallow Treatment Patient Details Name: [name] Phillips MRN: [id number] DOB: 06/25/1955 Today's Date: 08/11/2011  SLP Assessment/Plan/Recommendation Assessment Clinical Impression Statement: Patient seen by ST for diagnostic treatment for diet tolerance secondary to RN and PT noted increased  "coughing" during am meal  on 08-10-11. Patient 's spouse present during entire treatment.  Patient stated his "stomach hurt" but stated pain in "muscles" not pain as in nausea.  Patient administered mechanical soft consistency ( graham cracker in puree consistency) x6 teaspoons and thin water by cup/straw x4 ounces.  No observed s/s of aspiration noted with trials.  Patient did require moderate verbal cues to initiate swallow with solid trials.   Improvement  in area of bolus cohesion and effective transit  this date in comparsion of notes during initial BSE on 08/09/11.  Patient 's spouse present  during  treatment and stated  she refused to give patient the ground sausage  this am due to coughing on it previous day.  Recommend the following:   Continue current diet of dysphagia 3 (moist foods) and thin liquid  Full supervision secondary to continued cognitive deficits  Aspiration precautions ST to follow 1x and discharge. No speech therapy warranted at next level of care.  Swallowing Goals  SLP Swallowing Goals Swallow Study Goal #3 - Progress: Progressing toward goal  General Patient observed directly with PO's: Yes Type of PO's observed: Dysphagia 3 (soft) Feeding: Needs assist Liquids provided via: Cup;Straw Temperature Spikes Noted: No Respiratory Status: Supplemental O2 delivered via (comment) Behavior/Cognition: Requires cueing;Cooperative Oral Cavity - Dentition: Dentures, top;Dentures, bottom Patient Positioning: Upright in bed  Moreen Fowler MS, CCC-SLP [phone]   [facility] 08/11/2011, 2:13 PM

## 2011-08-12 NOTE — Progress Notes (Signed)
   CARE MANAGEMENT NOTE 08/12/2011  Patient:  [name] Phillips, [name] Phillips   Account Number:  [id number]  Date Initiated:  08/09/2011  Documentation initiated by:  Alvira Philips Assessment:   56 yr-old male adm with seizure; lives with spouse, has rolling walker, h/o home health services with [facility].     Action/Plan:   Anticipated DC Date:  08/14/2011   Anticipated DC Plan:  HOME/SELF CARE  In-house referral  Clinical Social Worker      DC Planning Services  CM consult      Choice offered to / List presented to:             Status of service:   Medicare Important Message given?   (If response is "NO", the following Medicare IM given date fields will be blank) Date Medicare IM given:   Date Additional Medicare IM given:    Discharge Disposition:    Per UR Regulation:    If discussed at Long Length of Stay Meetings, dates discussed:    Comments:  3/18 spoke w pt and wife, have made sw ref for etoh co. pt has ins and enc pt to purchase meds using ins. he had not been using ins to get meds. md also to try and use 4.00 meds if poss for cost. await pt/ot eval rec for dc planning. debbie Jaheim Canino rn,bsn [phone]

## 2011-08-12 NOTE — Progress Notes (Signed)
Subjective: AAOX2; No fever and w/o SOB. Patient more interactive, able to follow commands cooperative with examination. No agitation events.   Objective: Vital signs in last 24 hours: Temp:  [97.6 F (36.4 C)-98.4 F (36.9 C)] 97.7 F (36.5 C) (03/18 1200) Pulse Rate:  [80-95] 80  (03/18 1200) Resp:  [18] 18  (03/18 1200) BP: (99-142)/(72-103) 123/93 mmHg (03/18 1200) SpO2:  [97 %-99 %] 98 % (03/18 1200) Weight:  [74.8 kg (164 lb 14.5 oz)] 74.8 kg (164 lb 14.5 oz) (03/18 0500) Weight change:  Last BM Date: 08/11/11  Intake/Output from previous day: 03/17 0701 - 03/18 0700 In: 120 [P.O.:120] Out: 200 [Urine:200] Total I/O In: 400 [P.O.:400] Out: -    Physical Exam: General: Alert, awake, oriented x2, in no acute distress, Cooperative and interactive. HEENT: No bruits, no goiter. Heart: Tachycardia; without murmurs, rubs, gallops. Lungs: Clear to auscultation bilaterally. Abdomen: Soft, nontender, nondistended, positive bowel sounds. Extremities: No clubbing, cyanosis or edema with positive pedal pulses. Neuro: nonfocal.  Lab Results: Basic Metabolic Panel:  Basename 08/11/11 0701 08/10/11 0645  NA 138 136  K 3.4* 3.6  CL 99 96  CO2 28 27  GLUCOSE 100* 98  BUN 14 15  CREATININE 1.08 1.50*  CALCIUM 9.6 9.2  MG -- --  PHOS -- --   CBC:  Basename 08/10/11 0645  WBC 5.1  NEUTROABS --  HGB 11.2*  HCT 32.5*  MCV 90.3  PLT 55*   Urine Drug Screen: Drugs of Abuse     Component Value Date/Time   LABOPIA NONE DETECTED 08/08/2011 1600   COCAINSCRNUR NONE DETECTED 08/08/2011 1600   LABBENZ POSITIVE* 08/08/2011 1600   AMPHETMU NONE DETECTED 08/08/2011 1600   THCU NONE DETECTED 08/08/2011 1600   LABBARB NONE DETECTED 08/08/2011 1600    Misc. Labs:  Recent Results (from the past 240 hour(s))  MRSA PCR SCREENING     Status: Normal   Collection Time   08/08/11  8:04 PM      Component Value Range Status Comment   MRSA by PCR NEGATIVE  NEGATIVE  Final      Studies/Results: No results found.  Medications: Scheduled Meds:    . antiseptic oral rinse  15 mL Mouth Rinse BID  . atorvastatin  20 mg Oral q1800  . carvedilol  25 mg Oral BID WC  . chlordiazePOXIDE  25 mg Oral BID  . clopidogrel  75 mg Oral Q breakfast  . LORazepam  0-4 mg Intravenous Q12H  . magnesium chloride  1 tablet Oral BID  . nicotine  14 mg Transdermal Daily  . pantoprazole  40 mg Oral BID AC  . phenytoin  100 mg Oral TID  . potassium chloride SA  20 mEq Oral BID  . ramipril  20 mg Oral Daily  . sodium chloride  3 mL Intravenous Q12H  . thiamine  100 mg Oral Daily   Continuous Infusions:  PRN Meds:.sodium chloride, acetaminophen, acetaminophen, diphenhydrAMINE, hydrALAZINE, ondansetron (ZOFRAN) IV, ondansetron, sodium chloride  Assessment/Plan: 1-Seizure disorder: will continue dilantin TID as per neurology recommendations. MRI w/o new stroke. PT/OT evaluation still pending; will follow they recommendations for discharge plans. Contiinue CIWA.  2-Toxic encephalopathy: secondary to post ictal and medications (especially benzos and antiepileptics). Continue librium and will try to continue minimizing the use of benzos. Patient AAOX2, able to follow commands and more cooperative.   3-Alcohol abuse: counseling provided. Continue CIWA and librium. SW will also discussed with patient about alcohol abuse and outpatient resources to help  him quitting.  4-Tobacco abuse: continue nicotine patch.  5-Accelerated HTN (hypertension): Well controlled now. Continue current regimen.  6-ARF: Back to normal.   7-Systolic CHF: chronic and compensated; will continue home medication regimen and follow I'S and O's and daily weights.  8-H/O: CVA (cardiovascular accident): Continue plavix.  9-Low magnesium levels and Hypokalemia: Continue daily maintenance dose. BMET in am  10-GERD: continue PPI.  10-DVT: SCD's    LOS: 4 days   Mitchelle Goerner [facility] [phone]  08/12/2011, 1:10 PM

## 2011-08-13 LAB — BASIC METABOLIC PANEL
BUN: 13 mg/dL (ref 6–23)
CO2: 28 mEq/L (ref 19–32)
Calcium: 9.8 mg/dL (ref 8.4–10.5)
Chloride: 101 mEq/L (ref 96–112)
Creatinine, Ser: 0.96 mg/dL (ref 0.50–1.35)
GFR calc Af Amer: >90 mL/min (ref >90)
GFR calc non Af Amer: 90 mL/min — ABNORMAL LOW (ref >90)
Glucose, Bld: 99 mg/dL (ref 70–99)
Potassium: 3.6 mEq/L (ref 3.5–5.1)
Sodium: 136 mEq/L (ref 135–145)

## 2011-08-13 NOTE — Progress Notes (Signed)
Physical Therapy Treatment Patient Details Name: [name] Phillips MRN: [id number] DOB: 1955-12-28 Today's Date: 08/13/2011  PT Assessment/Plan  PT - Assessment/Plan Comments on Treatment Session: Patient s/p seizure.  Patient deconditioned.  Recommend that patient have 24 hour care which wife can provide.  Will need RW and HHPT f/u as well.   PT Plan: Discharge plan needs to be updated;Frequency remains appropriate PT Frequency: Min 3X/week Follow Up Recommendations: Home health PT;Supervision/Assistance - 24 hour Equipment Recommended: Rolling walker with 5" wheels;3 in 1 bedside comode PT Goals  Acute Rehab PT Goals PT Goal Formulation: With patient Time For Goal Achievement: 2 weeks Pt will go Supine/Side to Sit: with supervision PT Goal: Supine/Side to Sit - Progress: Goal set today Pt will Sit at Edge of Bed: Independently;3-5 min;with no upper extremity support PT Goal: Sit at Edge Of Bed - Progress: Goal set today Pt will go Sit to Supine/Side: with supervision PT Goal: Sit to Supine/Side - Progress: Goal set today Pt will go Sit to Stand: with supervision;with upper extremity assist PT Goal: Sit to Stand - Progress: Goal set today Pt will go Stand to Sit: with supervision;with upper extremity assist PT Goal: Stand to Sit - Progress: Goal set today Pt will Ambulate: 51 - 150 feet;with supervision;with least restrictive assistive device PT Goal: Ambulate - Progress: Goal set today  PT Treatment Precautions/Restrictions  Precautions Precautions: Fall Required Braces or Orthoses: No Restrictions Weight Bearing Restrictions: No Mobility (including Balance) Bed Mobility Supine to Sit: 4: Min assist;With rails;HOB elevated (Comment degrees) (HOB 30 degrees) Supine to Sit Details (indicate cue type and reason): pt assisted with moving LEs over and with trunk initiation Sitting - Scoot to Edge of Bed: 4: Min assist Sit to Supine: Not Tested (comment) Transfers Transfers:  Yes Sit to Stand: 1: +2 Total assist;Patient percentage (comment);From elevated surface;With upper extremity assist;From bed (pt = 50%) Sit to Stand Details (indicate cue type and reason): Generally unsteady upon initial stand needing incr time for patient to obtain his balance Stand to Sit: 4: Min assist;With upper extremity assist;With armrests;To chair/3-in-1 Stand to Sit Details: cues adn assist to control descent Stand Pivot Transfers: 1: +2 Total assist;Patient percentage (comment) (pt = 60%) Stand Pivot Transfer Details (indicate cue type and reason): Needed bil HHA to get from bed to recliner for stability Ambulation/Gait Ambulation/Gait: Yes Ambulation/Gait Assistance: 1: +2 Total assist;Patient percentage (comment) (pt = 70%) Ambulation/Gait Assistance Details (indicate cue type and reason): Pt. ambulated with bil HHA.  Very unsteady needing support for stability Ambulation Distance (Feet): 13 Feet Assistive device: 2 person hand held assist Gait Pattern: Step-to pattern;Decreased stride length;Shuffle;Ataxic;Decreased step length - right;Decreased step length - left Stairs: No Wheelchair Mobility Wheelchair Mobility: No  Posture/Postural Control Posture/Postural Control: No significant limitations Static Sitting Balance Static Sitting - Balance Support: Feet supported;No upper extremity supported Static Sitting - Level of Assistance: 5: Stand by assistance Static Sitting - Comment/# of Minutes: 5 minutes    End of Session PT - End of Session Equipment Utilized During Treatment: Gait belt Activity Tolerance: Patient limited by fatigue (poor motivation) Patient left: in chair;with call bell in reach;with family/visitor present Nurse Communication: Mobility status for transfers;Mobility status for ambulation General Behavior During Session: [facility] for tasks performed Cognition: [facility] for tasks performed  INGOLD,Chantale Leugers 08/13/2011, 2:36 PM  [facility] Acute  Rehabilitation [phone] (pager)

## 2011-08-13 NOTE — Progress Notes (Signed)
   CARE MANAGEMENT NOTE 08/13/2011  Patient:  [name] Phillips, [name] Phillips   Account Number:  [id number]  Date Initiated:  08/09/2011  Documentation initiated by:  Alvira Philips Assessment:   56 yr-old male adm with seizure; lives with spouse, has rolling walker, h/o home health services with [facility].     Action/Plan:   Anticipated DC Date:  08/14/2011   Anticipated DC Plan:  HOME/SELF CARE  In-house referral  Clinical Social Worker      DC Associate Professor  CM consult      [facility] Choice  HOME HEALTH   Choice offered to / List presented to:  C-3 Spouse        HH arranged  HH-2 PT      HH agency  [facility]   Status of service:   Medicare Important Message given?   (If response is "NO", the following Medicare IM given date fields will be blank) Date Medicare IM given:   Date Additional Medicare IM given:    Discharge Disposition:  HOME W HOME HEALTH SERVICES  Per UR Regulation:  Reviewed for med. necessity/level of care/duration of stay  If discussed at Long Length of Stay Meetings, dates discussed:    Comments:  3/19 spoke w wife and gave her hhc agency list. hx w [facility] and she would like same agency. ref [facility] w [facility] for [facility], pt has rolling walker at home. debbie Iantha Titsworth rn,bsn [phone]  3/18 spoke w pt and wife, have made sw ref for etoh co. pt has ins and enc pt to purchase meds using ins. he had not been using ins to get meds. md also to try and use 4.00 meds if poss for cost. await pt/ot eval rec for dc planning. debbie Ashir Kunz rn,bsn [phone]

## 2011-08-13 NOTE — Progress Notes (Signed)
Speech Pathology: Dysphagia Treatment Note  Patient was observed with : Regular textures and Thin liquids.  Patient was noted to have s/s of aspiration : Yes  Lung Sounds:  Bilateral diminished bases Temperature: 98.2  Patient required: supervision cues to consistently follow precautions/strategies  Clinical Impression: SLP facilitated session with supervision semantic cues to consume small amounts of liquids via cup or straw to prevent coughing.  Patient demonstrated slightly prolonged mastication of regular textures; wife present and reported that he was at baseline and that even at home he would cough if he guzzled his drinks.  She states that she is always with him and provides full supervision.      Recommendations:  Diet upgrade to regular textures and thin liquids with continued full supervision from wife with meals. No further skilled SLP services warranted at this time.   Pain:   moderate Intervention Required:   Yes Patient requested heat pack for left neck and RN notified.  Goals: All Goals Met  Fae Pippin, M.A., CCC-SLP [phone]

## 2011-08-13 NOTE — Progress Notes (Signed)
Clinical Social Work Department BRIEF PSYCHOSOCIAL ASSESSMENT 08/13/2011  Patient:  [name] Phillips, [name] Phillips     Account Number:  [id number]     Admit date:  08/08/2011  Clinical Social Worker:  Hulan Fray  Date/Time:  08/13/2011 10:28 AM  Referred by:  Physician  Date Referred:  08/08/2011 Referred for  Substance Abuse   Other Referral:   Interview type:  Other - See comment Other interview type:   Patient and spouse, Jenova    PSYCHOSOCIAL DATA Living Status:  WIFE Admitted from facility:   Level of care:   Primary support name:  Aldean Jewett Primary support relationship to patient:  SPOUSE Degree of support available:   supportive    CURRENT CONCERNS Current Concerns  Substance Abuse   Other Concerns:    SOCIAL WORK ASSESSMENT / PLAN CSW received referral for substance abuse consult with patient. Patient's spouse, Aldean Jewett, was at bedside. CSW completed an SBIRT with patient. Patient appears to acknowledge that he does have an issue with substance abuse, but is not interested in resources.CSW asked patient what he believed would help him and he stated having a sponsor. CSW spoke with patient about acquiring a sponsor and he appeared receptive to the idea. CSW spoke with patient regarding who he could call for a sponsor and he mentioned Elspeth Cho. CSW will sign off as social work intervention is no longer needed as patient declined any resources or assistance from CSW.   Assessment/plan status:  No Further Intervention Required Other assessment/ plan:   Information/referral to community resources:    PATIENT'S/FAMILY'S RESPONSE TO PLAN OF CARE: Patient stated that he was not interested in any resources because he has been to [employer] before and he was in a program called [facility] three years ago in [place], [place]. Patient stated that he tried AA meetings, but stated that "people are just telling him how a drunk is suppose to act and they don't know anything about it. All drunks  don't act the same." Patient appeared receptive to idea of a sponsor, but the sponsor had to have been through the struggle of overcoming alcohol abuse. Patient's wife called Elspeth Cho and the patient and the Bishop spoke on the phone regarding being a sponsor.

## 2011-08-13 NOTE — Progress Notes (Signed)
Subjective: AAOX3; No fever and w/o SOB. Patient doing a lot much better; but still too weak and with significant decrease coordination and unbalance. No agitation events.   Objective: Vital signs in last 24 hours: Temp:  [97.9 F (36.6 C)-98.4 F (36.9 C)] 98 F (36.7 C) (03/19 1600) Pulse Rate:  [77-86] 82  (03/19 1600) Resp:  [18] 18  (03/19 1600) BP: (126-151)/(88-114) 132/93 mmHg (03/19 1600) SpO2:  [96 %-99 %] 99 % (03/19 1600) Weight change:  Last BM Date: 08/12/11  Intake/Output from previous day: 03/18 0701 - 03/19 0700 In: 400 [P.O.:400] Out: -      Physical Exam: General: Alert, awake, oriented x3, in no acute distress, Cooperative and interactive. HEENT: No bruits, no goiter. Heart: Tachycardia; without murmurs, rubs, gallops. Lungs: Clear to auscultation bilaterally. Abdomen: Soft, nontender, nondistended, positive bowel sounds. Extremities: No clubbing, cyanosis or edema with positive pedal pulses. Neuro: nonfocal.  Lab Results: Basic Metabolic Panel:  Basename 08/13/11 0500 08/11/11 0701  NA 136 138  K 3.6 3.4*  CL 101 99  CO2 28 28  GLUCOSE 99 100*  BUN 13 14  CREATININE 0.96 1.08  CALCIUM 9.8 9.6  MG -- --  PHOS -- --   Urine Drug Screen: Drugs of Abuse     Component Value Date/Time   LABOPIA NONE DETECTED 08/08/2011 1600   COCAINSCRNUR NONE DETECTED 08/08/2011 1600   LABBENZ POSITIVE* 08/08/2011 1600   AMPHETMU NONE DETECTED 08/08/2011 1600   THCU NONE DETECTED 08/08/2011 1600   LABBARB NONE DETECTED 08/08/2011 1600    Misc. Labs:  Recent Results (from the past 240 hour(s))  MRSA PCR SCREENING     Status: Normal   Collection Time   08/08/11  8:04 PM      Component Value Range Status Comment   MRSA by PCR NEGATIVE  NEGATIVE  Final     Studies/Results: No results found.  Medications: Scheduled Meds:    . antiseptic oral rinse  15 mL Mouth Rinse BID  . atorvastatin  20 mg Oral q1800  . carvedilol  25 mg Oral BID WC  .  chlordiazePOXIDE  25 mg Oral BID  . clopidogrel  75 mg Oral Q breakfast  . LORazepam  0-4 mg Intravenous Q12H  . magnesium chloride  1 tablet Oral BID  . nicotine  14 mg Transdermal Daily  . pantoprazole  40 mg Oral BID AC  . phenytoin  100 mg Oral TID  . potassium chloride SA  20 mEq Oral BID  . ramipril  20 mg Oral Daily  . sodium chloride  3 mL Intravenous Q12H  . thiamine  100 mg Oral Daily   Continuous Infusions:  PRN Meds:.sodium chloride, acetaminophen, acetaminophen, diphenhydrAMINE, hydrALAZINE, ondansetron (ZOFRAN) IV, ondansetron, sodium chloride  Assessment/Plan: 1-Seizure disorder: will continue dilantin TID as per neurology recommendations. MRI w/o new stroke. PT/OT evaluation recommedign HHPT; will arrange and most likely discharge home in 24-48 hours. When patient stronger to help his wife providing care of him.   2-Toxic encephalopathy: secondary to post ictal and medications (especially benzos and antiepileptics). Continue librium and will try to continue minimizing the use of benzos.   3-Alcohol abuse: counseling provided. Continue librium. SW discussed with patient about alcohol cessation and has provided outpatient resources to help him quit.  4-Tobacco abuse: continue nicotine patch.  5-Accelerated HTN (hypertension): Well controlled now. Continue current regimen.  6-ARF: Back to normal.   7-Systolic CHF: chronic and compensated; will continue home medication regimen and follow I'S  and O's and daily weights.  8-H/O: CVA (cardiovascular accident): Continue plavix.  9-Low magnesium levels and Hypokalemia: Continue daily maintenance dose. BMET in am  10-GERD: continue PPI.  10-DVT: SCD's    LOS: 5 days   [name] Phillips [facility] [phone]  08/13/2011, 7:17 PM

## 2011-08-14 MED ORDER — FOLIC ACID 1 MG PO TABS: 1.0000 mg | ORAL_TABLET | Freq: Every day | ORAL | Status: DC

## 2011-08-14 MED ORDER — POTASSIUM CHLORIDE CRYS ER 20 MEQ PO TBCR: 20.0000 meq | EXTENDED_RELEASE_TABLET | Freq: Every day | ORAL | Status: AC

## 2011-08-14 MED ORDER — PHENYTOIN SODIUM EXTENDED 100 MG PO CAPS: 100.0000 mg | ORAL_CAPSULE | Freq: Three times a day (TID) | ORAL | Status: DC

## 2011-08-14 MED ORDER — RAMIPRIL 10 MG PO CAPS: 20.0000 mg | ORAL_CAPSULE | Freq: Every day | ORAL | Status: DC

## 2011-08-14 MED ORDER — NICOTINE 14 MG/24HR TD PT24: 1.0000 | MEDICATED_PATCH | Freq: Every day | TRANSDERMAL | Status: AC

## 2011-08-14 MED ORDER — VITAMIN B-1 100 MG PO TABS: 100.0000 mg | ORAL_TABLET | Freq: Every day | ORAL | Status: AC

## 2011-08-14 MED ORDER — CARVEDILOL 25 MG PO TABS: 25.0000 mg | ORAL_TABLET | Freq: Two times a day (BID) | ORAL | Status: DC

## 2011-08-14 MED ORDER — ZOLPIDEM TARTRATE 5 MG PO TABS: 5.0000 mg | ORAL_TABLET | Freq: Every evening | ORAL | Status: DC | PRN

## 2011-08-14 MED ORDER — CLOPIDOGREL BISULFATE 75 MG PO TABS: 75.0000 mg | ORAL_TABLET | Freq: Every day | ORAL | Status: AC

## 2011-08-14 MED ORDER — MAGNESIUM CHLORIDE 64 MG PO TBEC: 1.0000 | DELAYED_RELEASE_TABLET | Freq: Two times a day (BID) | ORAL | Status: DC

## 2011-08-14 MED ORDER — CHLORDIAZEPOXIDE HCL 25 MG PO CAPS: 25.0000 mg | ORAL_CAPSULE | Freq: Two times a day (BID) | ORAL | Status: AC

## 2011-08-14 MED ORDER — ATORVASTATIN CALCIUM 20 MG PO TABS: 20.0000 mg | ORAL_TABLET | Freq: Every day | ORAL | Status: DC

## 2011-08-14 NOTE — Progress Notes (Signed)
   CARE MANAGEMENT NOTE 08/14/2011  Patient:  [name] Phillips, [name] Phillips   Account Number:  [id number]  Date Initiated:  08/09/2011  Documentation initiated by:  Alvira Philips Assessment:   56 yr-old male adm with seizure; lives with spouse, has rolling walker, h/o home health services with [facility].     Action/Plan:   Anticipated DC Date:  08/14/2011   Anticipated DC Plan:  HOME W HOME HEALTH SERVICES  In-house referral  Clinical Social Worker      DC Planning Services  CM consult      [facility] Choice  HOME HEALTH   Choice offered to / List presented to:  C-3 Spouse   DME arranged  3-N-1      DME agency  [facility]     HH arranged  HH-2 PT      HH agency  [facility]   Status of service:   Medicare Important Message given?   (If response is "NO", the following Medicare IM given date fields will be blank) Date Medicare IM given:   Date Additional Medicare IM given:    Discharge Disposition:  HOME W HOME HEALTH SERVICES  Per UR Regulation:  Reviewed for med. necessity/level of care/duration of stay  If discussed at Long Length of Stay Meetings, dates discussed:    Comments:  3/20 alerted [facility] of disch, has walker. did order 3n1 bsc w [facility] who has contract for dme w [facility]. faxed order. they will del 3n1 to room or home. Suella Grove [phone]  3/19 spoke w wife and gave her hhc agency list. hx w [facility] and she would like same agency. ref to [facility] w [facility] for [facility], pt has rolling walker at home. debbie Hayk Divis rn,bsn [phone]  3/18 spoke w pt and wife, have made sw ref for etoh co. pt has ins and enc pt to purchase meds using ins. he had not been using ins to get meds. md also to try and use 4.00 meds if poss for cost. await pt/ot eval rec for dc planning. debbie Lener Ventresca rn,bsn [phone]

## 2011-08-14 NOTE — Progress Notes (Signed)
Pt discharged to home per MD order. Pt received all discharge instructions and medication information including all prescriptions and follow-up appointments.   Pt alert and oriented at time of discharge with no complaints.  Efraim Kaufmann

## 2011-08-14 NOTE — Progress Notes (Signed)
Subjective: Patient feeling well today.  Denies any pain.  Complained of intermittent cough but no shortness of breath.  Objective: Vital signs in last 24 hours: Filed Vitals:   08/13/11 1600 08/13/11 2100 08/14/11 0500 08/14/11 0800  BP: 132/93 136/99 136/93 143/100  Pulse: 82 87 83 90  Temp: 98 F (36.7 C) 98.3 F (36.8 C) 97.9 F (36.6 C) 98.1 F (36.7 C)  TempSrc:  Oral Oral Oral  Resp: 18 20 20 20   Height:      Weight:      SpO2: 99% 98% 95% 96%   Weight change:   Intake/Output Summary (Last 24 hours) at 08/14/11 1121 Last data filed at 08/13/11 1900  Gross per 24 hour  Intake    240 ml  Output    500 ml  Net   -260 ml    Physical Exam: General: Awake, Oriented, No acute distress. HEENT: EOMI. Neck: Supple CV: S1 and S2 Lungs: Clear to ascultation bilaterally Abdomen: Soft, Nontender, Nondistended, +bowel sounds. Ext: Good pulses. Trace edema.  Lab Results:  Basename 08/13/11 0500  NA 136  K 3.6  CL 101  CO2 28  GLUCOSE 99  BUN 13  CREATININE 0.96  CALCIUM 9.8  MG --  PHOS --   No results found for this basename: AST:2,ALT:2,ALKPHOS:2,BILITOT:2,PROT:2,ALBUMIN:2 in the last 72 hours No results found for this basename: LIPASE:2,AMYLASE:2 in the last 72 hours No results found for this basename: WBC:2,NEUTROABS:2,HGB:2,HCT:2,MCV:2,PLT:2 in the last 72 hours No results found for this basename: CKTOTAL:3,CKMB:3,CKMBINDEX:3,TROPONINI:3 in the last 72 hours No components found with this basename: POCBNP:3 No results found for this basename: DDIMER:2 in the last 72 hours No results found for this basename: HGBA1C:2 in the last 72 hours No results found for this basename: CHOL:2,HDL:2,LDLCALC:2,TRIG:2,CHOLHDL:2,LDLDIRECT:2 in the last 72 hours No results found for this basename: TSH,T4TOTAL,FREET3,T3FREE,THYROIDAB in the last 72 hours No results found for this basename: VITAMINB12:2,FOLATE:2,FERRITIN:2,TIBC:2,IRON:2,RETICCTPCT:2 in the last 72 hours  Micro  Results: Recent Results (from the past 240 hour(s))  MRSA PCR SCREENING     Status: Normal   Collection Time   08/08/11  8:04 PM      Component Value Range Status Comment   MRSA by PCR NEGATIVE  NEGATIVE  Final     Studies/Results: No results found.  Medications: I have reviewed the patient's current medications. Scheduled Meds:   . antiseptic oral rinse  15 mL Mouth Rinse BID  . atorvastatin  20 mg Oral q1800  . carvedilol  25 mg Oral BID WC  . chlordiazePOXIDE  25 mg Oral BID  . clopidogrel  75 mg Oral Q breakfast  . magnesium chloride  1 tablet Oral BID  . nicotine  14 mg Transdermal Daily  . pantoprazole  40 mg Oral BID AC  . phenytoin  100 mg Oral TID  . potassium chloride SA  20 mEq Oral BID  . ramipril  20 mg Oral Daily  . sodium chloride  3 mL Intravenous Q12H  . thiamine  100 mg Oral Daily   Continuous Infusions:  PRN Meds:.sodium chloride, acetaminophen, acetaminophen, diphenhydrAMINE, hydrALAZINE, ondansetron (ZOFRAN) IV, ondansetron, sodium chloride  Assessment/Plan: 1. Seizure disorder: Continue dilantin TID as per neurology recommendations. MRI cannot be done due to patient having AICD. PT/OT evaluation recommedign HHPT; will arrange at discharge.  Wife continues to offer support and provides care scare for the patient.   2. Toxic encephalopathy: Secondary to post ictal and medications (especially benzos and antiepileptics). Continue librium for 2 more days to  complete 8 day course, try to continue minimizing the use of benzos.   3. Alcohol abuse: counseling provided. Continue librium. SW discussed with patient about alcohol cessation and has provided outpatient resources to help him quit.   4. Tobacco abuse: continue nicotine patch.   5. Accelerated HTN (hypertension): Well controlled now. Continue current regimen.   6. ARF: Resolved.    7. Systolic CHF: chronic and compensated; will continue home medication regimen and follow I'S and O's and daily weights.     8. H/O: CVA (cardiovascular accident): Continue plavix.   9. Low magnesium levels and Hypokalemia: Continue daily maintenance dose.  10. GERD: continue PPI.   11. DVT: SCD's  12.  Disposition.  Discharge the patient today.    LOS: 6 days  Kindrick Lankford A, MD 08/14/2011, 11:21 AM

## 2011-08-14 NOTE — Discharge Summary (Signed)
Discharge Summary  [name] Phillips MR#: [id number]  DOB:Nov 02, 1955  Date of Admission: 08/08/2011 Date of Discharge: 08/14/2011  Patient's PCP: Pola Corn, MD, MD  Attending Physician:Rondall Radigan A  Consults: Dr. Lyman Speller, Neurology  Discharge Diagnoses: Principal Problem:  *Seizure disorder Active Problems:  Alcohol abuse  Tobacco abuse  HTN (hypertension)  Systolic CHF  H/O: CVA (cardiovascular accident)  AICD (automatic cardioverter/defibrillator) present  Low magnesium levels  Hypokalemia  Brief Admitting History and Physical 56 year old [place] American male with history of hypertension, alcohol of piece, nonischemic cardiomyopathy (status post AICD placement), tobacco abuse, history of CVA presented on 08/08/2011 with seizures.  Discharge Medications Medication List  As of 08/14/2011 11:41 AM   STOP taking these medications         rosuvastatin 10 MG tablet         TAKE these medications         atorvastatin 20 MG tablet   Commonly known as: LIPITOR   Take 1 tablet (20 mg total) by mouth daily at 6 PM.      carvedilol 25 MG tablet   Commonly known as: COREG   Take 1 tablet (25 mg total) by mouth 2 (two) times daily with a meal.      chlordiazePOXIDE 25 MG capsule   Commonly known as: LIBRIUM   Take 1 capsule (25 mg total) by mouth 2 (two) times daily. For 2 days then discontinue.      clopidogrel 75 MG tablet   Commonly known as: PLAVIX   Take 1 tablet (75 mg total) by mouth daily.      folic acid 1 MG tablet   Commonly known as: FOLVITE   Take 1 tablet (1 mg total) by mouth daily.      magnesium chloride 64 MG Tbec   Commonly known as: SLOW-MAG   Take 1 tablet (64 mg total) by mouth 2 (two) times daily.      nicotine 14 mg/24hr patch   Commonly known as: NICODERM CQ - dosed in mg/24 hours   Place 1 patch onto the skin daily. Do not smoke while on nicotine patch.      phenytoin 100 MG ER capsule   Commonly known as: DILANTIN   Take 1 capsule  (100 mg total) by mouth 3 (three) times daily.      potassium chloride SA 20 MEQ tablet   Commonly known as: K-DUR,KLOR-CON   Take 1 tablet (20 mEq total) by mouth daily.      ramipril 10 MG capsule   Commonly known as: ALTACE   Take 2 capsules (20 mg total) by mouth daily.      thiamine 100 MG tablet   Commonly known as: VITAMIN B-1   Take 1 tablet (100 mg total) by mouth daily.      zolpidem 5 MG tablet   Commonly known as: AMBIEN   Take 1 tablet (5 mg total) by mouth at bedtime as needed for sleep.            Hospital Course: 1. Seizure disorder: Neurology was consulted and Dr. Lyman Speller evaluated the patient.  Patient was placed on alcohol withdrawal precautions and seizure precautions.  On admission patient was loaded on Dilantin and was continued on maintenance dosage.  Initially the patient was on IV Dilantin which was transitioned to oral dilantin TID as per neurology recommendations.  Head CT done with results as indicated below, no acute abnormality noted.  MRI cannot be done due to patient having AICD. PT/OT  evaluation recommending HHPT which was arranged at discharge.  Wife continues to offer support and provides care scare for the patient.  Discussed the importance of alcohol cessation with the patient.   2. Toxic encephalopathy: Secondary to post ictal and medications (especially benzos and antiepileptics).  Resolved.  Continue librium for 2 more days to complete 8 day course, try to continue minimizing the use of benzos.   3. Alcohol abuse: Counseled the patient on cessation. Continue librium. SW discussed with patient about alcohol cessation and has provided outpatient resources to help him quit.   4. Tobacco abuse: continue nicotine patch.   5. Accelerated HTN (hypertension): Well controlled now. Continue current regimen.  Further management as outpatient.  6. ARF: Resolved.    7. Systolic CHF: chronic and compensated; will continue home medication regimen and follow  I'S and O's and daily weights.   8. H/O: CVA (cardiovascular accident): Continue plavix.   9. Low magnesium levels and Hypokalemia: Continue daily maintenance dose.  10. GERD: continue PPI.   Day of Discharge BP 143/100  Pulse 90  Temp(Src) 98.1 F (36.7 C) (Oral)  Resp 20  Ht 6\' 3"  (1.905 m)  Wt 74.8 kg (164 lb 14.5 oz)  BMI 20.61 kg/m2  SpO2 96%  Results for orders placed during the hospital encounter of 08/08/11 (from the past 48 hour(s))  BASIC METABOLIC PANEL     Status: Abnormal   Collection Time   08/13/11  5:00 AM      Component Value Range Comment   Sodium 136  135 - 145 (mEq/L)    Potassium 3.6  3.5 - 5.1 (mEq/L)    Chloride 101  96 - 112 (mEq/L)    CO2 28  19 - 32 (mEq/L)    Glucose, Bld 99  70 - 99 (mg/dL)    BUN 13  6 - 23 (mg/dL)    Creatinine, Ser 2.53  0.50 - 1.35 (mg/dL)    Calcium 9.8  8.4 - 10.5 (mg/dL)    GFR calc non Af Amer 90 (*) >90 (mL/min)    GFR calc Af Amer >90  >90 (mL/min)     Dg Chest 2 View  08/07/2011  *RADIOLOGY REPORT*  Clinical Data:  Chest pain and shortness of breath.  CHEST - 2 VIEW  Comparison: Chest x-ray 12/01/2010.  Findings: Lung volumes are normal.  No consolidative airspace disease.  No pleural effusions.  No pneumothorax.  No pulmonary nodule or mass noted.  Pulmonary vasculature and the cardiomediastinal silhouette are within normal limits.  A right- sided AICD is in place with lead tip projecting over the expected location of the right ventricular apex.  IMPRESSION: No radiographic evidence of acute cardiopulmonary disease.  Original Report Authenticated By: Florencia Reasons, M.D.   Ct Head Wo Contrast  08/08/2011  *RADIOLOGY REPORT*  Clinical Data: Seizure.  Chronic ethanol abuse.  Uncontrolled hypertension.  CT HEAD WITHOUT CONTRAST  Technique:  Contiguous axial images were obtained from the base of the skull through the vertex without contrast.  Comparison: 12/03/2010  Findings: Mild soft tissue swelling over the left side  of the forehead. There is atrophy and chronic small vessel disease changes.  Old left occipital infarct, stable. No acute intracranial abnormality.  Specifically, no hemorrhage, hydrocephalus, mass lesion, acute infarction, or significant intracranial injury.  No acute calvarial abnormality. Visualized paranasal sinuses and mastoids clear.  Orbital soft tissues unremarkable.  IMPRESSION: No acute intracranial abnormality.  Atrophy, chronic microvascular disease.  Old left occipital infarct.  Original Report Authenticated By: Cyndie Chime, M.D.   Disposition: Home with home health PT/OT.  Diet: Heart healthy diet.  Activity: Resume as tolerated   Follow-up Appts: Discharge Orders    Future Orders Please Complete By Expires   Diet - low sodium heart healthy      Increase activity slowly      Discharge instructions      Comments:   Followup with Pola Corn, MD (PCP) in 1 week. Please abstain from drinking alcohol.  Please do not smoke while on nicotine patch.      TESTS THAT NEED FOLLOW-UP None  Time spent on discharge, talking to the patient, and coordinating care: 35 mins.   Signed: Cristal Ford, MD 08/14/2011, 11:41 AM

## 2011-08-14 NOTE — Consult Note (Signed)
Reviewed By Tawana Scale EdD

## 2011-08-14 NOTE — Discharge Instructions (Signed)
[facility] [phone] home health phy therapy  [name] Phillips [phone] [place] 3n1 bsc to room or home  Alcohol Problems Most adults who drink alcohol drink in moderation (not a lot) are at low risk for developing problems related to their drinking. However, all drinkers, including low-risk drinkers, should know about the health risks connected with drinking alcohol. ABSTAIN FROM (DO NOT DRINK) ALCOHOL:  When pregnant or considering pregnancy.   When taking a medication that interacts with alcohol.   If you are alcohol dependent.   A medical condition that prohibits drinking alcohol (such as ulcer, liver disease, or heart disease).  DISCUSS WITH YOUR CAREGIVER:  If you are at risk for coronary heart disease, discuss the potential benefits and risks of alcohol use: Light to moderate drinking is associated with lower rates of coronary heart disease in certain populations (for example, men over age 54 and postmenopausal women). Infrequent or nondrinkers are advised not to begin light to moderate drinking to reduce the risk of coronary heart disease so as to avoid creating an alcohol-related problem. Similar protective effects can likely be gained through proper diet and exercise.   Women and the elderly have smaller amounts of body water than men. As a result women and the elderly achieve a higher blood alcohol concentration after drinking the same amount of alcohol.   Exposing a fetus to alcohol can cause a broad range of birth defects referred to as Fetal Alcohol Syndrome (FAS) or Alcohol-Related Birth Defects (ARBD). Although FAS/ARBD is connected with excessive alcohol consumption during pregnancy, studies also have reported neurobehavioral problems in infants born to mothers reporting drinking an average of 1 drink per day during pregnancy.   Heavier drinking (the consumption of more than 4 drinks per occasion by men and more than 3 drinks per occasion by women) impairs learning (cognitive) and  psychomotor functions and increases the risk of alcohol-related problems, including accidents and injuries.  CAGE QUESTIONS:   Have you ever felt that you should Cut down on your drinking?   Have people Annoyed you by criticizing your drinking?   Have you ever felt bad or Guilty about your drinking?   Have you ever had a drink first thing in the morning to steady your nerves or get rid of a hangover (Eye opener)?  If you answered positively to any of these questions: You may be at risk for alcohol-related problems if alcohol consumption is:   Men: Greater than 14 drinks per week or more than 4 drinks per occasion.   Women: Greater than 7 drinks per week or more than 3 drinks per occasion.  Do you or your family have a medical history of alcohol-related problems, such as:  Blackouts.   Sexual dysfunction.   Depression.   Trauma.   Liver dysfunction.   Sleep disorders.   Hypertension.   Chronic abdominal pain.   Has your drinking ever caused you problems, such as problems with your family, problems with your work (or school) performance, or accidents/injuries?   Do you have a compulsion to drink or a preoccupation with drinking?   Do you have poor control or are you unable to stop drinking once you have started?   Do you have to drink to avoid withdrawal symptoms?   Do you have problems with withdrawal such as tremors, nausea, sweats, or mood disturbances?   Does it take more alcohol than in the past to get you high?   Do you feel a strong urge to drink?  Do you change your plans so that you can have a drink?   Do you ever drink in the morning to relieve the shakes or a hangover?  If you have answered a number of the previous questions positively, it may be time for you to talk to your caregivers, family, and friends and see if they think you have a problem. Alcoholism is a chemical dependency that keeps getting worse and will eventually destroy your health and  relationships. Many alcoholics end up dead, impoverished, or in prison. This is often the end result of all chemical dependency.  Do not be discouraged if you are not ready to take action immediately.   Decisions to change behavior often involve up and down desires to change and feeling like you cannot decide.   Try to think more seriously about your drinking behavior.   Think of the reasons to quit.  WHERE TO GO FOR ADDITIONAL INFORMATION   The National Institute on Alcohol Abuse and Alcoholism (NIAAA)www.niaaa.nih.gov   [employer] on Alcoholism and Drug Dependence (NCADD)www.ncadd.org   American Society of Addiction Medicine (ASAM)www.https://anderson-johnson.com/  Document Released: 05/13/2005 Document Revised: 05/02/2011 Document Reviewed: 12/30/2007 [facility] Patient Information 2012 [place], [place].  Alcohol Withdrawal Anytime drug use is interfering with normal living activities it has become abuse. This includes problems with family and friends. Psychological dependence has developed when your mind tells you that the drug is needed. This is usually followed by physical dependence when a continuing increase of drugs are required to get the same feeling or "high." This is known as addiction or chemical dependency. A person's risk is much higher if there is a history of chemical dependency in the family. Mild Withdrawal Following Stopping Alcohol, When Addiction or Chemical Dependency Has Developed When a person has developed tolerance to alcohol, any sudden stopping of alcohol can cause uncomfortable physical symptoms. Most of the time these are mild and consist of tremors in the hands and increases in heart rate, breathing, and temperature. Sometimes these symptoms are associated with anxiety, panic attacks, and bad dreams. There may also be stomach upset. Normal sleep patterns are often interrupted with periods of inability to sleep (insomnia). This may last for 6 months. Because of this discomfort,  many people choose to continue drinking to get rid of this discomfort and to try to feel normal. Severe Withdrawal with Decreased or No Alcohol Intake, When Addiction or Chemical Dependency Has Developed About five percent of alcoholics will develop signs of severe withdrawal when they stop using alcohol. One sign of this is development of generalized seizures (convulsions). Other signs of this are severe agitation and confusion. This may be associated with believing in things which are not real or seeing things which are not really there (delusions and hallucinations). Vitamin deficiencies are usually present if alcohol intake has been long-term. Treatment for this most often requires hospitalization and close observation. Addiction can only be helped by stopping use of all chemicals. This is hard but may save your life. With continual alcohol use, possible outcomes are usually loss of self respect and esteem, violence, and death. Addiction cannot be cured but it can be stopped. This often requires outside help and the care of professionals. Treatment centers are listed in the yellow pages under Cocaine, Narcotics, and Alcoholics Anonymous. Most hospitals and clinics can refer you to a specialized care center. It is not necessary for you to go through the uncomfortable symptoms of withdrawal. Your caregiver can provide you with medicines that will help  you through this difficult period. Try to avoid situations, friends, or drugs that made it possible for you to keep using alcohol in the past. Learn how to say no. It takes a long period of time to overcome addictions to all drugs, including alcohol. There may be many times when you feel as though you want a drink. After getting rid of the physical addiction and withdrawal, you will have a lessening of the craving which tells you that you need alcohol to feel normal. Call your caregiver if more support is needed. Learn who to talk to in your family and among  your friends so that during these periods you can receive outside help. Alcoholics Anonymous (AA) has helped many people over the years. To get further help, contact AA or call your caregiver, counselor, or clergyperson. Al-Anon and Alateen are support groups for friends and family members of an alcoholic. The people who love and care for an alcoholic often need help, too. For information about these organizations, check your phone directory or call a local alcoholism treatment center.  SEEK IMMEDIATE MEDICAL CARE IF:   You have a seizure.   You have a fever.   You experience uncontrolled vomiting or you vomit up blood. This may be bright red or look like black coffee grounds.   You have blood in the stool. This may be bright red or appear as a black, tarry, bad-smelling stool.   You become lightheaded or faint. Do not drive if you feel this way. Have someone else drive you or call 161 for help.   You become more agitated or confused.   You develop uncontrolled anxiety.   You begin to see things that are not really there (hallucinate).  Your caregiver has determined that you completely understand your medical condition, and that your mental state is back to normal. You understand that you have been treated for alcohol withdrawal, have agreed not to drink any alcohol for a minimum of 1 day, will not operate a car or other machinery for 24 hours, and have had an opportunity to ask any questions about your condition. Document Released: 02/20/2005 Document Revised: 05/02/2011 Document Reviewed: 12/30/2007 [facility] Patient Information 2012 [place], [place].Alcohol Withdrawal Anytime drug use is interfering with normal living activities it has become abuse. This includes problems with family and friends. Psychological dependence has developed when your mind tells you that the drug is needed. This is usually followed by physical dependence when a continuing increase of drugs are required to get the same  feeling or "high." This is known as addiction or chemical dependency. A person's risk is much higher if there is a history of chemical dependency in the family. Mild Withdrawal Following Stopping Alcohol, When Addiction or Chemical Dependency Has Developed When a person has developed tolerance to alcohol, any sudden stopping of alcohol can cause uncomfortable physical symptoms. Most of the time these are mild and consist of tremors in the hands and increases in heart rate, breathing, and temperature. Sometimes these symptoms are associated with anxiety, panic attacks, and bad dreams. There may also be stomach upset. Normal sleep patterns are often interrupted with periods of inability to sleep (insomnia). This may last for 6 months. Because of this discomfort, many people choose to continue drinking to get rid of this discomfort and to try to feel normal. Severe Withdrawal with Decreased or No Alcohol Intake, When Addiction or Chemical Dependency Has Developed About five percent of alcoholics will develop signs of severe withdrawal when they  stop using alcohol. One sign of this is development of generalized seizures (convulsions). Other signs of this are severe agitation and confusion. This may be associated with believing in things which are not real or seeing things which are not really there (delusions and hallucinations). Vitamin deficiencies are usually present if alcohol intake has been long-term. Treatment for this most often requires hospitalization and close observation. Addiction can only be helped by stopping use of all chemicals. This is hard but may save your life. With continual alcohol use, possible outcomes are usually loss of self respect and esteem, violence, and death. Addiction cannot be cured but it can be stopped. This often requires outside help and the care of professionals. Treatment centers are listed in the yellow pages under Cocaine, Narcotics, and Alcoholics Anonymous. Most  hospitals and clinics can refer you to a specialized care center. It is not necessary for you to go through the uncomfortable symptoms of withdrawal. Your caregiver can provide you with medicines that will help you through this difficult period. Try to avoid situations, friends, or drugs that made it possible for you to keep using alcohol in the past. Learn how to say no. It takes a long period of time to overcome addictions to all drugs, including alcohol. There may be many times when you feel as though you want a drink. After getting rid of the physical addiction and withdrawal, you will have a lessening of the craving which tells you that you need alcohol to feel normal. Call your caregiver if more support is needed. Learn who to talk to in your family and among your friends so that during these periods you can receive outside help. Alcoholics Anonymous (AA) has helped many people over the years. To get further help, contact AA or call your caregiver, counselor, or clergyperson. Al-Anon and Alateen are support groups for friends and family members of an alcoholic. The people who love and care for an alcoholic often need help, too. For information about these organizations, check your phone directory or call a local alcoholism treatment center.  SEEK IMMEDIATE MEDICAL CARE IF:   You have a seizure.   You have a fever.   You experience uncontrolled vomiting or you vomit up blood. This may be bright red or look like black coffee grounds.   You have blood in the stool. This may be bright red or appear as a black, tarry, bad-smelling stool.   You become lightheaded or faint. Do not drive if you feel this way. Have someone else drive you or call [phone] for help.   You become more agitated or confused.   You develop uncontrolled anxiety.   You begin to see things that are not really there (hallucinate).  Your caregiver has determined that you completely understand your medical condition, and that your  mental state is back to normal. You understand that you have been treated for alcohol withdrawal, have agreed not to drink any alcohol for a minimum of 1 day, will not operate a car or other machinery for 24 hours, and have had an opportunity to ask any questions about your condition. Document Released: 02/20/2005 Document Revised: 05/02/2011 Document Reviewed: 12/30/2007 [facility] Patient Information 2012 [place], [place].

## 2011-09-22 ENCOUNTER — Emergency Department (HOSPITAL_COMMUNITY)
Admission: EM | Admit: 2011-09-22 | Discharge: 2011-09-22 | Disposition: A | Discharge status: Home / Self Care | Payer: Medicare HMO | Attending: Emergency Medicine | Admitting: Emergency Medicine

## 2011-09-22 ENCOUNTER — Encounter (HOSPITAL_COMMUNITY): Payer: Self-pay

## 2011-09-22 DIAGNOSIS — I509 Heart failure, unspecified: Secondary | ICD-10-CM | POA: Insufficient documentation

## 2011-09-22 DIAGNOSIS — F17200 Nicotine dependence, unspecified, uncomplicated: Secondary | ICD-10-CM | POA: Insufficient documentation

## 2011-09-22 DIAGNOSIS — R350 Frequency of micturition: Secondary | ICD-10-CM | POA: Insufficient documentation

## 2011-09-22 DIAGNOSIS — Z95810 Presence of automatic (implantable) cardiac defibrillator: Secondary | ICD-10-CM | POA: Insufficient documentation

## 2011-09-22 DIAGNOSIS — Z79899 Other long term (current) drug therapy: Secondary | ICD-10-CM | POA: Insufficient documentation

## 2011-09-22 DIAGNOSIS — R300 Dysuria: Secondary | ICD-10-CM | POA: Insufficient documentation

## 2011-09-22 DIAGNOSIS — R209 Unspecified disturbances of skin sensation: Secondary | ICD-10-CM | POA: Insufficient documentation

## 2011-09-22 DIAGNOSIS — Z8673 Personal history of transient ischemic attack (TIA), and cerebral infarction without residual deficits: Secondary | ICD-10-CM | POA: Insufficient documentation

## 2011-09-22 DIAGNOSIS — I10 Essential (primary) hypertension: Secondary | ICD-10-CM | POA: Insufficient documentation

## 2011-09-22 DIAGNOSIS — R3915 Urgency of urination: Secondary | ICD-10-CM | POA: Insufficient documentation

## 2011-09-22 DIAGNOSIS — N390 Urinary tract infection, site not specified: Secondary | ICD-10-CM | POA: Insufficient documentation

## 2011-09-22 HISTORY — DX: Unspecified convulsions: R56.9

## 2011-09-22 LAB — URINE MICROSCOPIC-ADD ON

## 2011-09-22 LAB — URINALYSIS, ROUTINE W REFLEX MICROSCOPIC
Bilirubin Urine: NEGATIVE
Glucose, UA: NEGATIVE mg/dL
Nitrite: POSITIVE — AB
Protein, ur: 100 mg/dL — AB
Specific Gravity, Urine: 1.022 (ref 1.005–1.030)
Urobilinogen, UA: 1 mg/dL (ref 0.0–1.0)
pH: 6.5 (ref 5.0–8.0)

## 2011-09-22 MED ORDER — SULFAMETHOXAZOLE-TRIMETHOPRIM 800-160 MG PO TABS: 1.0000 | ORAL_TABLET | Freq: Two times a day (BID) | ORAL | Status: AC

## 2011-09-22 MED ORDER — SULFAMETHOXAZOLE-TMP DS 800-160 MG PO TABS
1.0000 | ORAL_TABLET | Freq: Once | ORAL | Status: AC
  Administered 2011-09-22: 1 via ORAL
  Filled 2011-09-22: qty 1

## 2011-09-22 NOTE — ED Notes (Signed)
EAV:[id number]> Expected date:<BR> Expected time: 3:40 PM<BR> Means of arrival:Ambulance<BR> Comments:<BR> M61 -- Painful Urination

## 2011-09-22 NOTE — ED Provider Notes (Addendum)
History     CSN: [id number]  Arrival date & time 09/22/11  1542   First MD Initiated Contact with Patient 09/22/11 1557      Chief Complaint  Patient presents with  . Dysuria    (Consider location/radiation/quality/duration/timing/severity/associated sxs/prior treatment) HPI  Patient relates she's been having tingling when he urinates and states he's just trickling. He has dysuria and urgency and frequency he states he has to urinate about every 5-6 minutes but it's only a tiny amount of urine. He denies any nausea, vomiting, hematuria, dysuria, penile drip, or abdominal pain. He states his symptoms started yesterday. He denies any new sexual contacts and states has not had sex in 6 months. He states he's never had this before. Patient states he had a Foley catheter placed in March.  PCP Dr. Shana Chute and Sharyn Lull  Past Medical History  Diagnosis Date  . CVA (cerebral vascular accident)   . Hypertension   . CHF (congestive heart failure)   . AICD (automatic cardioverter/defibrillator) present   . Seizure   . Pacemaker     Past Surgical History  Procedure Date  . Cardiac defibrillator placement     No family history on file.  History  Substance Use Topics  . Smoking status: Current Everyday Smoker -- 1.0 packs/day for 39 years  . Smokeless tobacco: Not on file  . Alcohol Use: Yes     moonshine, quart per day 39 years  disability for back   Review of Systems  All other systems reviewed and are negative.    Allergies  Codeine and Penicillins  Home Medications   Current Outpatient Rx  Name Route Sig Dispense Refill  . ATORVASTATIN CALCIUM 20 MG PO TABS Oral Take 1 tablet (20 mg total) by mouth daily at 6 PM. 30 tablet 0  . CARVEDILOL 25 MG PO TABS Oral Take 1 tablet (25 mg total) by mouth 2 (two) times daily with a meal. 60 tablet 0  . CLOPIDOGREL BISULFATE 75 MG PO TABS Oral Take 1 tablet (75 mg total) by mouth daily. 30 tablet 0  . FOLIC ACID 1 MG PO TABS Oral  Take 1 tablet (1 mg total) by mouth daily. 30 tablet 0  . MAGNESIUM CHLORIDE 64 MG PO TBEC Oral Take 1 tablet (64 mg total) by mouth 2 (two) times daily. 60 tablet 0  . NICOTINE 14 MG/24HR TD PT24 Transdermal Place 1 patch onto the skin daily.    Marland Kitchen PHENYTOIN SODIUM EXTENDED 100 MG PO CAPS Oral Take 1 capsule (100 mg total) by mouth 3 (three) times daily. 90 capsule 0  . POTASSIUM CHLORIDE CRYS ER 20 MEQ PO TBCR Oral Take 1 tablet (20 mEq total) by mouth daily. 30 tablet 0  . RAMIPRIL 10 MG PO CAPS Oral Take 2 capsules (20 mg total) by mouth daily. 60 capsule 0  . VITAMIN B-1 100 MG PO TABS Oral Take 1 tablet (100 mg total) by mouth daily. 30 tablet 0  . ZOLPIDEM TARTRATE 5 MG PO TABS Oral Take 1 tablet (5 mg total) by mouth at bedtime as needed for sleep. 30 tablet 0    BP 167/117  Pulse 90  Temp(Src) 99.3 F (37.4 C) (Oral)  SpO2 100%  Vital signs normal except for low-grade temp   Physical Exam  Constitutional: He is oriented to person, place, and time. He appears well-developed and well-nourished.  Non-toxic appearance. He does not appear ill. No distress.  HENT:  Head: Normocephalic and atraumatic.  Right Ear:  External ear normal.  Left Ear: External ear normal.  Nose: Nose normal. No mucosal edema or rhinorrhea.  Mouth/Throat: Oropharynx is clear and moist and mucous membranes are normal. No dental abscesses or uvula swelling.  Eyes: Conjunctivae and EOM are normal. Pupils are equal, round, and reactive to light.  Neck: Normal range of motion and full passive range of motion without pain. Neck supple.  Cardiovascular: Normal rate, regular rhythm and normal heart sounds.  Exam reveals no gallop and no friction rub.   No murmur heard. Pulmonary/Chest: Effort normal and breath sounds normal. No respiratory distress. He has no wheezes. He has no rhonchi. He has no rales. He exhibits no tenderness and no crepitus.  Abdominal: Soft. Normal appearance and bowel sounds are normal. He  exhibits no distension. There is no tenderness. There is no rebound and no guarding.  Musculoskeletal: Normal range of motion. He exhibits no edema and no tenderness.       Moves all extremities well.   Neurological: He is alert and oriented to person, place, and time. He has normal strength. No cranial nerve deficit.  Skin: Skin is warm, dry and intact. No rash noted. No erythema. No pallor.  Psychiatric: He has a normal mood and affect. His speech is normal and behavior is normal. His mood appears not anxious.    ED Course  Procedures (including critical care time)   Medications  sulfamethoxazole-trimethoprim (SEPTRA DS) 800-160 MG per tablet (not administered)   Bladder scan 31 cc   Results for orders placed during the hospital encounter of 09/22/11  URINALYSIS, ROUTINE W REFLEX MICROSCOPIC      Component Value Range   Color, Urine YELLOW  YELLOW    APPearance CLOUDY (*) CLEAR    Specific Gravity, Urine 1.022  1.005 - 1.030    pH 6.5  5.0 - 8.0    Glucose, UA NEGATIVE  NEGATIVE (mg/dL)   Hgb urine dipstick LARGE (*) NEGATIVE    Bilirubin Urine NEGATIVE  NEGATIVE    Ketones, ur TRACE (*) NEGATIVE (mg/dL)   Protein, ur 960 (*) NEGATIVE (mg/dL)   Urobilinogen, UA 1.0  0.0 - 1.0 (mg/dL)   Nitrite POSITIVE (*) NEGATIVE    Leukocytes, UA LARGE (*) NEGATIVE   URINE MICROSCOPIC-ADD ON      Component Value Range   Squamous Epithelial / LPF RARE  RARE    WBC, UA TOO NUMEROUS TO COUNT  <3 (WBC/hpf)   RBC / HPF TOO NUMEROUS TO COUNT  <3 (RBC/hpf)   Bacteria, UA MANY (*) RARE    Laboratory interpretation urinary tract infection   1. Urinary tract infection     New Prescriptions   SULFAMETHOXAZOLE-TRIMETHOPRIM (SEPTRA DS) 800-160 MG PER TABLET    Take 1 tablet by mouth 2 (two) times daily.   Plan discharge  Devoria Albe, MD, FACEP   MDM          Ward Givens, MD 09/22/11 1702  Ward Givens, MD 09/22/11 1718

## 2011-09-22 NOTE — ED Notes (Signed)
First contact with pt. Dr Lynelle Doctor informed of blood pressure states "ok for discharge". Pt given instructions and verb understanding.

## 2011-09-22 NOTE — Discharge Instructions (Signed)
Drink plenty of fluids. Take the antibiotics until gone. Return if you get fever, vomiting, or pain in your flank or abdomen. Have Dr. Annitta Jersey office check your urine culture results on Tuesday or Wednesday to make sure the antibiotic prescribed is the right antibiotic for your infection.

## 2011-09-22 NOTE — ED Notes (Signed)
Pt reports dysuria, onset yesterday, and possible urinary retention, reports only urine in little amount. Reports urine  being yellow, no odor nor blood. No bladder distention noted. Non-tender at palpation at the pubic area.

## 2011-09-24 LAB — URINE CULTURE
Colony Count: >=100000
Culture  Setup Time: 201304282118

## 2011-09-25 NOTE — ED Notes (Signed)
+   Urine Patient treated with Bactrim-sensitive to same-chart appended per protocol MD. 

## 2012-01-31 ENCOUNTER — Emergency Department (HOSPITAL_COMMUNITY)
Admission: EM | Admit: 2012-01-31 | Discharge: 2012-01-31 | Disposition: A | Discharge status: Home / Self Care | Payer: Medicare Other | Attending: Emergency Medicine | Admitting: Emergency Medicine

## 2012-01-31 ENCOUNTER — Emergency Department (HOSPITAL_COMMUNITY): Payer: Medicare Other

## 2012-01-31 ENCOUNTER — Encounter (HOSPITAL_COMMUNITY): Payer: Self-pay | Admitting: Emergency Medicine

## 2012-01-31 DIAGNOSIS — R05 Cough: Secondary | ICD-10-CM

## 2012-01-31 DIAGNOSIS — F17200 Nicotine dependence, unspecified, uncomplicated: Secondary | ICD-10-CM | POA: Insufficient documentation

## 2012-01-31 DIAGNOSIS — Z8673 Personal history of transient ischemic attack (TIA), and cerebral infarction without residual deficits: Secondary | ICD-10-CM | POA: Insufficient documentation

## 2012-01-31 DIAGNOSIS — R059 Cough, unspecified: Secondary | ICD-10-CM

## 2012-01-31 DIAGNOSIS — I10 Essential (primary) hypertension: Secondary | ICD-10-CM | POA: Insufficient documentation

## 2012-01-31 DIAGNOSIS — J069 Acute upper respiratory infection, unspecified: Secondary | ICD-10-CM | POA: Insufficient documentation

## 2012-01-31 DIAGNOSIS — Z95810 Presence of automatic (implantable) cardiac defibrillator: Secondary | ICD-10-CM | POA: Insufficient documentation

## 2012-01-31 DIAGNOSIS — I509 Heart failure, unspecified: Secondary | ICD-10-CM | POA: Insufficient documentation

## 2012-01-31 MED ORDER — CHLORPHENIRAMINE-DM 4-30 MG PO TABS: 1.0000 | ORAL_TABLET | Freq: Four times a day (QID) | ORAL | Status: DC

## 2012-01-31 NOTE — ED Provider Notes (Signed)
History     CSN: [id number]  Arrival date & time 01/31/12  2031   None     Chief Complaint  Patient presents with  . Cough    (Consider location/radiation/quality/duration/timing/severity/associated sxs/prior treatment) HPI Comments: Patient has had an upper respiratory tract infection for the past 3, days, with a nonproductive cough, worse when he lies back.  He is been taking calls.  Throat lozenges without any relief  Patient is a 56 y.o. male presenting with cough. The history is provided by the patient.  Cough The current episode started more than 2 days ago. The problem has not changed since onset.The cough is non-productive. Associated symptoms include rhinorrhea. Pertinent negatives include no chest pain, no chills, no headaches, no sore throat and no shortness of breath.    Past Medical History  Diagnosis Date  . CVA (cerebral vascular accident)   . Hypertension   . CHF (congestive heart failure)   . AICD (automatic cardioverter/defibrillator) present   . Seizure   . Pacemaker     Past Surgical History  Procedure Date  . Cardiac defibrillator placement     No family history on file.  History  Substance Use Topics  . Smoking status: Current Everyday Smoker -- 1.0 packs/day for 39 years  . Smokeless tobacco: Not on file  . Alcohol Use: Yes     moonshine, quart per day 39 years      Review of Systems  Constitutional: Negative for fever and chills.  HENT: Positive for rhinorrhea, sneezing and postnasal drip. Negative for sore throat.   Respiratory: Positive for cough. Negative for shortness of breath.   Cardiovascular: Negative for chest pain.  Gastrointestinal: Negative for nausea.  Skin: Negative for wound.  Neurological: Negative for dizziness, weakness and headaches.    Allergies  Codeine and Penicillins  Home Medications   Current Outpatient Rx  Name Route Sig Dispense Refill  . ATORVASTATIN CALCIUM 20 MG PO TABS Oral Take 1 tablet (20 mg  total) by mouth daily at 6 PM. 30 tablet 0  . CARVEDILOL 25 MG PO TABS Oral Take 1 tablet (25 mg total) by mouth 2 (two) times daily with a meal. 60 tablet 0  . CLOPIDOGREL BISULFATE 75 MG PO TABS Oral Take 1 tablet (75 mg total) by mouth daily. 30 tablet 0  . MAGNESIUM CHLORIDE 64 MG PO TBEC Oral Take 1 tablet (64 mg total) by mouth 2 (two) times daily. 60 tablet 0  . POTASSIUM CHLORIDE CRYS ER 20 MEQ PO TBCR Oral Take 1 tablet (20 mEq total) by mouth daily. 30 tablet 0  . RAMIPRIL 10 MG PO CAPS Oral Take 2 capsules (20 mg total) by mouth daily. 60 capsule 0  . VITAMIN B-1 100 MG PO TABS Oral Take 1 tablet (100 mg total) by mouth daily. 30 tablet 0  . CHLORPHENIRAMINE-DM 4-30 MG PO TABS Oral Take 1 tablet by mouth 4 (four) times daily. 30 each 0    BP 107/80  Pulse 95  Temp 98.2 F (36.8 C) (Oral)  Resp 20  SpO2 94%  Physical Exam  Constitutional: He appears well-developed and well-nourished.  HENT:  Head: Normocephalic.  Nose: Rhinorrhea present.  Mouth/Throat: Uvula is midline. Posterior oropharyngeal erythema present. No oropharyngeal exudate or posterior oropharyngeal edema.  Neck: Normal range of motion.  Cardiovascular: Normal rate.   Pulmonary/Chest: Effort normal. No respiratory distress. He has no wheezes. He has no rales. He exhibits no tenderness.  Musculoskeletal: Normal range of motion.  Neurological:  He is alert.  Skin: Skin is warm.    ED Course  Procedures (including critical care time)  Labs Reviewed - No data to display Dg Chest 2 View  01/31/2012  *RADIOLOGY REPORT*  Clinical Data: Cough  CHEST - 2 VIEW  Comparison: 08/07/2011  Findings: The cardiac shadow is stable.  A defibrillator is again noted and stable.  No focal infiltrate or sizable effusion is seen. No acute bony abnormality is noted.  IMPRESSION: No acute abnormality is seen.   Original Report Authenticated By: Phillips Odor, M.D.      1. URI (upper respiratory infection)   2. Cough        MDM  X-ray, reviewed by me URI with cough will treat with Coricidin HBP and have patient followup with his primary care physician        Arman Filter, NP 01/31/12 2306  Arman Filter, NP 01/31/12 2306

## 2012-01-31 NOTE — ED Notes (Signed)
Pt relates L side pain to sleeping on couch.

## 2012-01-31 NOTE — ED Provider Notes (Signed)
Medical screening examination/treatment/procedure(s) were performed by non-physician practitioner and as supervising physician I was immediately available for consultation/collaboration.    Crickett Abbett R Lilyanne Mcquown, MD 01/31/12 2352 

## 2012-01-31 NOTE — ED Notes (Addendum)
C/o productive cough with yellow sputum, runny nose, and L side pain x 3 days.  Denies fever.  Relates side pain to sleeping on couch.

## 2012-05-13 ENCOUNTER — Emergency Department (HOSPITAL_COMMUNITY)
Admission: EM | Admit: 2012-05-13 | Discharge: 2012-05-14 | Disposition: A | Discharge status: Home / Self Care | Payer: Medicare Other | Attending: Emergency Medicine | Admitting: Emergency Medicine

## 2012-05-13 ENCOUNTER — Emergency Department (HOSPITAL_COMMUNITY): Payer: Medicare Other

## 2012-05-13 DIAGNOSIS — R109 Unspecified abdominal pain: Secondary | ICD-10-CM

## 2012-05-13 DIAGNOSIS — F17200 Nicotine dependence, unspecified, uncomplicated: Secondary | ICD-10-CM | POA: Insufficient documentation

## 2012-05-13 DIAGNOSIS — Z8673 Personal history of transient ischemic attack (TIA), and cerebral infarction without residual deficits: Secondary | ICD-10-CM | POA: Insufficient documentation

## 2012-05-13 DIAGNOSIS — F10129 Alcohol abuse with intoxication, unspecified: Secondary | ICD-10-CM

## 2012-05-13 DIAGNOSIS — Z8679 Personal history of other diseases of the circulatory system: Secondary | ICD-10-CM | POA: Insufficient documentation

## 2012-05-13 DIAGNOSIS — F10229 Alcohol dependence with intoxication, unspecified: Secondary | ICD-10-CM | POA: Insufficient documentation

## 2012-05-13 DIAGNOSIS — Z8669 Personal history of other diseases of the nervous system and sense organs: Secondary | ICD-10-CM | POA: Insufficient documentation

## 2012-05-13 DIAGNOSIS — I10 Essential (primary) hypertension: Secondary | ICD-10-CM | POA: Insufficient documentation

## 2012-05-13 DIAGNOSIS — R1900 Intra-abdominal and pelvic swelling, mass and lump, unspecified site: Secondary | ICD-10-CM | POA: Insufficient documentation

## 2012-05-13 DIAGNOSIS — Z95810 Presence of automatic (implantable) cardiac defibrillator: Secondary | ICD-10-CM | POA: Insufficient documentation

## 2012-05-13 DIAGNOSIS — Z79899 Other long term (current) drug therapy: Secondary | ICD-10-CM | POA: Insufficient documentation

## 2012-05-13 NOTE — ED Notes (Signed)
[id number]<JY> Expected date:<BR> Expected time:<BR> Means of arrival:<BR> Comments:<BR> 50s/Abdominal Pain/ETOH

## 2012-05-13 NOTE — ED Notes (Signed)
Pt slid out of bed, denied injury, wanted to help getting back in bed. Pt then started c/o abdominal pain, voices no BM x3 days and abdominal swelling and wanted further eval. Pt voices ETOH tonight.

## 2012-05-13 NOTE — ED Provider Notes (Signed)
History     CSN: [id number]  Arrival date & time 05/13/12  2235   First MD Initiated Contact with Patient 05/13/12 2305      Chief Complaint  Patient presents with  . Abdominal Pain    (Consider location/radiation/quality/duration/timing/severity/associated sxs/prior treatment) Patient is a 56 y.o. male presenting with abdominal pain. The history is provided by the patient and the spouse. The history is limited by the condition of the patient (pt is reported to be intoxicated on alcohol).  Abdominal Pain The primary symptoms of the illness include abdominal pain. The primary symptoms of the illness do not include fever, fatigue, shortness of breath, nausea, vomiting or diarrhea. Episode onset: "all day" The onset of the illness was gradual.  The illness is associated with alcohol use. The patient states that she believes she is currently not pregnant. The patient has had a change in bowel habit (no BM in 3 days). Additional symptoms associated with the illness include constipation. Symptoms associated with the illness do not include anorexia, diaphoresis, heartburn, urgency, hematuria or back pain. Significant associated medical issues include substance abuse (EtOH) and cardiac disease.    Past Medical History  Diagnosis Date  . CVA (cerebral vascular accident)   . Hypertension   . CHF (congestive heart failure)   . AICD (automatic cardioverter/defibrillator) present   . Seizure   . Pacemaker     Past Surgical History  Procedure Date  . Cardiac defibrillator placement     No family history on file.  History  Substance Use Topics  . Smoking status: Current Every Day Smoker -- 1.0 packs/day for 39 years  . Smokeless tobacco: Not on file  . Alcohol Use: Yes     Comment: moonshine, quart per day 39 years      Review of Systems  Unable to perform ROS Constitutional: Negative for fever, diaphoresis and fatigue.  Respiratory: Negative for shortness of breath.    Gastrointestinal: Positive for abdominal pain and constipation. Negative for heartburn, nausea, vomiting, diarrhea and anorexia.  Genitourinary: Negative for urgency and hematuria.  Musculoskeletal: Negative for back pain.    Allergies  Codeine and Penicillins  Home Medications   Current Outpatient Rx  Name  Route  Sig  Dispense  Refill  . ATORVASTATIN CALCIUM 20 MG PO TABS   Oral   Take 1 tablet (20 mg total) by mouth daily at 6 PM.   30 tablet   0   . CARVEDILOL 25 MG PO TABS   Oral   Take 1 tablet (25 mg total) by mouth 2 (two) times daily with a meal.   60 tablet   0   . CHLORPHENIRAMINE-DM 4-30 MG PO TABS   Oral   Take 1 tablet by mouth 4 (four) times daily.   30 each   0   . CLOPIDOGREL BISULFATE 75 MG PO TABS   Oral   Take 1 tablet (75 mg total) by mouth daily.   30 tablet   0   . MAGNESIUM CHLORIDE 64 MG PO TBEC   Oral   Take 1 tablet (64 mg total) by mouth 2 (two) times daily.   60 tablet   0   . POTASSIUM CHLORIDE CRYS ER 20 MEQ PO TBCR   Oral   Take 1 tablet (20 mEq total) by mouth daily.   30 tablet   0   . RAMIPRIL 10 MG PO CAPS   Oral   Take 2 capsules (20 mg total) by mouth daily.  60 capsule   0   . VITAMIN B-1 100 MG PO TABS   Oral   Take 1 tablet (100 mg total) by mouth daily.   30 tablet   0     BP 138/105  Pulse 93  Temp 97.6 F (36.4 C) (Oral)  Resp 18  SpO2 93%  Physical Exam  Nursing note and vitals reviewed. Constitutional: He appears well-developed and well-nourished. No distress.  HENT:  Head: Normocephalic and atraumatic.  Right Ear: External ear normal.  Left Ear: External ear normal.  Nose: Nose normal.  Mouth/Throat: Oropharynx is clear and moist. No oropharyngeal exudate.  Eyes: Conjunctivae normal are normal. Pupils are equal, round, and reactive to light. Right eye exhibits no discharge. Left eye exhibits no discharge. No scleral icterus.       No nystagmus  Neck: Normal range of motion. Neck supple.  No JVD present. No tracheal deviation present.  Cardiovascular: Normal rate, regular rhythm, normal heart sounds and intact distal pulses.  Exam reveals no gallop and no friction rub.   No murmur heard. Pulmonary/Chest: Effort normal and breath sounds normal. No stridor. No respiratory distress. He has no wheezes. He has no rales. He exhibits no tenderness.  Abdominal: Soft. Bowel sounds are normal. He exhibits distension (mild). He exhibits no shifting dullness, no pulsatile liver, no fluid wave, no abdominal bruit, no ascites, no pulsatile midline mass and no mass. There is no hepatosplenomegaly. There is generalized tenderness (Generalized, diffuse). There is no rigidity, no rebound, no guarding, no CVA tenderness, no tenderness at McBurney's point and negative Murphy's sign. No hernia.  Musculoskeletal: Normal range of motion. He exhibits no edema and no tenderness.  Lymphadenopathy:    He has no cervical adenopathy.  Neurological: He is alert. He has normal strength. He is disoriented. He displays no atrophy and no tremor. He exhibits normal muscle tone. He displays no seizure activity. GCS eye subscore is 4. GCS verbal subscore is 4. GCS motor subscore is 6. He displays no Babinski's sign on the right side. He displays no Babinski's sign on the left side.  Skin: Skin is warm, dry and intact. No rash noted. He is not diaphoretic. No erythema. No pallor.  Psychiatric: His speech is delayed. Cognition and memory are impaired. He exhibits abnormal recent memory and abnormal remote memory.    ED Course  Procedures (including critical care time)   Labs Reviewed  CBC  COMPREHENSIVE METABOLIC PANEL  PROTIME-INR  AMYLASE  LIPASE, BLOOD  ETHANOL  URINALYSIS, ROUTINE W REFLEX MICROSCOPIC  URINE RAPID DRUG SCREEN (HOSP PERFORMED)  LACTIC ACID, PLASMA   No results found.   No diagnosis found.    MDM  Pt presents for evaluation of abdominal pain.  His hx and exam are limited secondary to  altered mental status.  He appears nontoxic, note elevated BP, NAD.  Will obtain basic belly labs, amylase, u/a, lactic acid, urine tox screen, serum EtOH level, and a KUB.    0730.  Pt is now awake and oriented.  He appears nontoxic.  The abdominal discomfort has completely resolved.  Noted elevated alcohol level.  He also had mild elevations in AST, Alk phos, amylase, and lipase.  CT scan demonstrates hepatic steatosis without evidence of any acute processes.  Plan discharge home to follow-up and establish care with a local primary physician.      Tobin Chad, MD 05/14/12 [phone]

## 2012-05-14 ENCOUNTER — Emergency Department (HOSPITAL_COMMUNITY): Payer: Medicare Other

## 2012-05-14 LAB — CBC
HCT: 32.5 % — ABNORMAL LOW (ref 39.0–52.0)
Hemoglobin: 11.2 g/dL — ABNORMAL LOW (ref 13.0–17.0)
MCH: 32.5 pg (ref 26.0–34.0)
MCHC: 34.5 g/dL (ref 30.0–36.0)
MCV: 94.2 fL (ref 78.0–100.0)
Platelets: 120 10*3/uL — ABNORMAL LOW (ref 150–400)
RBC: 3.45 MIL/uL — ABNORMAL LOW (ref 4.22–5.81)
RDW: 15.1 % (ref 11.5–15.5)
WBC: 4 10*3/uL (ref 4.0–10.5)

## 2012-05-14 LAB — PROTIME-INR
INR: 0.96 (ref 0.00–1.49)
Prothrombin Time: 12.7 seconds (ref 11.6–15.2)

## 2012-05-14 LAB — COMPREHENSIVE METABOLIC PANEL
ALT: 48 U/L (ref 0–53)
AST: 64 U/L — ABNORMAL HIGH (ref 0–37)
Albumin: 3.2 g/dL — ABNORMAL LOW (ref 3.5–5.2)
Alkaline Phosphatase: 118 U/L — ABNORMAL HIGH (ref 39–117)
BUN: 17 mg/dL (ref 6–23)
CO2: 27 mEq/L (ref 19–32)
Calcium: 9.6 mg/dL (ref 8.4–10.5)
Chloride: 100 mEq/L (ref 96–112)
Creatinine, Ser: 0.94 mg/dL (ref 0.50–1.35)
GFR calc Af Amer: >90 mL/min (ref >90)
GFR calc non Af Amer: >90 mL/min (ref >90)
Glucose, Bld: 116 mg/dL — ABNORMAL HIGH (ref 70–99)
Potassium: 3.1 mEq/L — ABNORMAL LOW (ref 3.5–5.1)
Sodium: 143 mEq/L (ref 135–145)
Total Bilirubin: 0.1 mg/dL — ABNORMAL LOW (ref 0.3–1.2)
Total Protein: 6.5 g/dL (ref 6.0–8.3)

## 2012-05-14 LAB — URINALYSIS, ROUTINE W REFLEX MICROSCOPIC
Bilirubin Urine: NEGATIVE
Glucose, UA: NEGATIVE mg/dL
Hgb urine dipstick: NEGATIVE
Ketones, ur: NEGATIVE mg/dL
Leukocytes, UA: NEGATIVE
Nitrite: NEGATIVE
Protein, ur: 30 mg/dL — AB
Specific Gravity, Urine: 1.022 (ref 1.005–1.030)
Urobilinogen, UA: 1 mg/dL (ref 0.0–1.0)
pH: 6 (ref 5.0–8.0)

## 2012-05-14 LAB — RAPID URINE DRUG SCREEN, HOSP PERFORMED
Amphetamines: NOT DETECTED
Barbiturates: NOT DETECTED
Benzodiazepines: POSITIVE — AB
Cocaine: NOT DETECTED
Opiates: NOT DETECTED
Tetrahydrocannabinol: NOT DETECTED

## 2012-05-14 LAB — LACTIC ACID, PLASMA: Lactic Acid, Venous: 3.1 mmol/L — ABNORMAL HIGH (ref 0.5–2.2)

## 2012-05-14 LAB — ETHANOL: Alcohol, Ethyl (B): 304 mg/dL — ABNORMAL HIGH (ref 0–11)

## 2012-05-14 LAB — AMYLASE: Amylase: 106 U/L — ABNORMAL HIGH (ref 0–105)

## 2012-05-14 LAB — LIPASE, BLOOD: Lipase: 96 U/L — ABNORMAL HIGH (ref 11–59)

## 2012-05-14 LAB — URINE MICROSCOPIC-ADD ON

## 2012-05-14 MED ORDER — IOHEXOL 300 MG/ML  SOLN
100.0000 mL | Freq: Once | INTRAMUSCULAR | Status: AC | PRN
  Administered 2012-05-14: 100 mL via INTRAVENOUS

## 2012-05-14 MED ORDER — POTASSIUM CHLORIDE CRYS ER 20 MEQ PO TBCR
40.0000 meq | EXTENDED_RELEASE_TABLET | Freq: Once | ORAL | Status: AC
  Administered 2012-05-14: 40 meq via ORAL
  Filled 2012-05-14: qty 2

## 2012-05-14 MED ORDER — PANTOPRAZOLE SODIUM 40 MG IV SOLR
40.0000 mg | INTRAVENOUS | Status: AC
  Administered 2012-05-14: 40 mg via INTRAVENOUS
  Filled 2012-05-14: qty 40

## 2012-05-14 NOTE — ED Notes (Signed)
Patient transported to CT 

## 2012-10-02 ENCOUNTER — Emergency Department (HOSPITAL_COMMUNITY): Payer: Medicare Other

## 2012-10-02 ENCOUNTER — Emergency Department (HOSPITAL_COMMUNITY)
Admission: EM | Admit: 2012-10-02 | Discharge: 2012-10-03 | Disposition: A | Discharge status: Home / Self Care | Payer: Medicare Other | Attending: Emergency Medicine | Admitting: Emergency Medicine

## 2012-10-02 ENCOUNTER — Encounter (HOSPITAL_COMMUNITY): Payer: Self-pay | Admitting: Emergency Medicine

## 2012-10-02 ENCOUNTER — Other Ambulatory Visit: Payer: Self-pay

## 2012-10-02 DIAGNOSIS — I509 Heart failure, unspecified: Secondary | ICD-10-CM | POA: Insufficient documentation

## 2012-10-02 DIAGNOSIS — Z7902 Long term (current) use of antithrombotics/antiplatelets: Secondary | ICD-10-CM | POA: Insufficient documentation

## 2012-10-02 DIAGNOSIS — Z7982 Long term (current) use of aspirin: Secondary | ICD-10-CM | POA: Insufficient documentation

## 2012-10-02 DIAGNOSIS — I69921 Dysphasia following unspecified cerebrovascular disease: Secondary | ICD-10-CM | POA: Insufficient documentation

## 2012-10-02 DIAGNOSIS — Z8673 Personal history of transient ischemic attack (TIA), and cerebral infarction without residual deficits: Secondary | ICD-10-CM | POA: Insufficient documentation

## 2012-10-02 DIAGNOSIS — F17200 Nicotine dependence, unspecified, uncomplicated: Secondary | ICD-10-CM | POA: Insufficient documentation

## 2012-10-02 DIAGNOSIS — R071 Chest pain on breathing: Secondary | ICD-10-CM | POA: Insufficient documentation

## 2012-10-02 DIAGNOSIS — F10929 Alcohol use, unspecified with intoxication, unspecified: Secondary | ICD-10-CM

## 2012-10-02 DIAGNOSIS — I10 Essential (primary) hypertension: Secondary | ICD-10-CM | POA: Insufficient documentation

## 2012-10-02 DIAGNOSIS — R0789 Other chest pain: Secondary | ICD-10-CM

## 2012-10-02 DIAGNOSIS — G40909 Epilepsy, unspecified, not intractable, without status epilepticus: Secondary | ICD-10-CM | POA: Insufficient documentation

## 2012-10-02 DIAGNOSIS — F1010 Alcohol abuse, uncomplicated: Secondary | ICD-10-CM | POA: Insufficient documentation

## 2012-10-02 DIAGNOSIS — Z95810 Presence of automatic (implantable) cardiac defibrillator: Secondary | ICD-10-CM | POA: Insufficient documentation

## 2012-10-02 DIAGNOSIS — Z880 Allergy status to penicillin: Secondary | ICD-10-CM | POA: Insufficient documentation

## 2012-10-02 DIAGNOSIS — Z79899 Other long term (current) drug therapy: Secondary | ICD-10-CM | POA: Insufficient documentation

## 2012-10-02 LAB — CBC WITH DIFFERENTIAL/PLATELET
Basophils Absolute: 0 10*3/uL (ref 0.0–0.1)
Basophils Relative: 1 % (ref 0–1)
Eosinophils Absolute: 0.1 10*3/uL (ref 0.0–0.7)
Eosinophils Relative: 3 % (ref 0–5)
HCT: 31.9 % — ABNORMAL LOW (ref 39.0–52.0)
Hemoglobin: 11.2 g/dL — ABNORMAL LOW (ref 13.0–17.0)
Lymphocytes Relative: 52 % — ABNORMAL HIGH (ref 12–46)
Lymphs Abs: 2 10*3/uL (ref 0.7–4.0)
MCH: 32.8 pg (ref 26.0–34.0)
MCHC: 35.1 g/dL (ref 30.0–36.0)
MCV: 93.5 fL (ref 78.0–100.0)
Monocytes Absolute: 0.2 10*3/uL (ref 0.1–1.0)
Monocytes Relative: 5 % (ref 3–12)
Neutro Abs: 1.5 10*3/uL — ABNORMAL LOW (ref 1.7–7.7)
Neutrophils Relative %: 39 % — ABNORMAL LOW (ref 43–77)
Platelets: 133 10*3/uL — ABNORMAL LOW (ref 150–400)
RBC: 3.41 MIL/uL — ABNORMAL LOW (ref 4.22–5.81)
RDW: 15.7 % — ABNORMAL HIGH (ref 11.5–15.5)
WBC: 3.9 10*3/uL — ABNORMAL LOW (ref 4.0–10.5)

## 2012-10-02 LAB — COMPREHENSIVE METABOLIC PANEL
ALT: 113 U/L — ABNORMAL HIGH (ref 0–53)
AST: 180 U/L — ABNORMAL HIGH (ref 0–37)
Albumin: 3.7 g/dL (ref 3.5–5.2)
Alkaline Phosphatase: 98 U/L (ref 39–117)
BUN: 15 mg/dL (ref 6–23)
CO2: 22 mEq/L (ref 19–32)
Calcium: 8.8 mg/dL (ref 8.4–10.5)
Chloride: 102 mEq/L (ref 96–112)
Creatinine, Ser: 1.03 mg/dL (ref 0.50–1.35)
GFR calc Af Amer: 90 mL/min — ABNORMAL LOW (ref >90)
GFR calc non Af Amer: 78 mL/min — ABNORMAL LOW (ref >90)
Glucose, Bld: 61 mg/dL — ABNORMAL LOW (ref 70–99)
Potassium: 3.7 mEq/L (ref 3.5–5.1)
Sodium: 146 mEq/L — ABNORMAL HIGH (ref 135–145)
Total Bilirubin: 0.3 mg/dL (ref 0.3–1.2)
Total Protein: 6.9 g/dL (ref 6.0–8.3)

## 2012-10-02 LAB — ACETAMINOPHEN LEVEL: Acetaminophen (Tylenol), Serum: <15 ug/mL (ref 10–30)

## 2012-10-02 LAB — ETHANOL: Alcohol, Ethyl (B): 323 mg/dL — ABNORMAL HIGH (ref 0–11)

## 2012-10-02 LAB — POCT I-STAT TROPONIN I: Troponin i, poc: 0 ng/mL (ref 0.00–0.08)

## 2012-10-02 LAB — D-DIMER, QUANTITATIVE (NOT AT ARMC): D-Dimer, Quant: 2.49 ug/mL-FEU — ABNORMAL HIGH (ref 0.00–0.48)

## 2012-10-02 LAB — SALICYLATE LEVEL: Salicylate Lvl: <2 mg/dL — ABNORMAL LOW (ref 2.8–20.0)

## 2012-10-02 MED ORDER — IOHEXOL 350 MG/ML SOLN
80.0000 mL | Freq: Once | INTRAVENOUS | Status: AC | PRN
  Administered 2012-10-02: 80 mL via INTRAVENOUS

## 2012-10-02 NOTE — ED Notes (Addendum)
BIB [facility]. From home call for "heart problems". Per patient, past 3-4 weeks pain at defibrillator site.  NAD, NO CP, SOB, LOC 20g Right hand. 124/80 70hr 16rr 97% 2L [place]

## 2012-10-02 NOTE — ED Provider Notes (Signed)
History     CSN: [id number]  Arrival date & time 10/02/12  1650   None     Chief Complaint  Patient presents with  . AICD Problem    (Consider location/radiation/quality/duration/timing/severity/associated sxs/prior treatment) Patient is a 57 y.o. male presenting with chest pain. The history is provided by the patient, the spouse and medical records. No language interpreter was used.  Chest Pain Chest pain location: Pt is a 57 yo man who has had an AICD since the mid 2000's.  He says he has had pain around the AICD for 2 days.  He cried out in pain with it this evening, and his wife called EMS. Pain quality: sharp   Pain radiates to:  Does not radiate Pain radiates to the back: no   Pain severity:  Severe Onset quality:  Sudden Duration:  5 minutes Timing:  Sporadic Progression:  Waxing and waning Chronicity:  New Relieved by:  Nothing Worsened by:  Nothing tried Associated symptoms: no fever   Associated symptoms comment:  Heavy alcohol intake, smokes 1 ppd. Risk factors: hypertension, male sex and smoking   Risk factors comment:  CVA   Past Medical History  Diagnosis Date  . CVA (cerebral vascular accident)   . Hypertension   . CHF (congestive heart failure)   . AICD (automatic cardioverter/defibrillator) present   . Seizure   . Pacemaker     Past Surgical History  Procedure Laterality Date  . Cardiac defibrillator placement      History reviewed. No pertinent family history.  History  Substance Use Topics  . Smoking status: Current Every Day Smoker -- 1.00 packs/day for 39 years  . Smokeless tobacco: Not on file  . Alcohol Use: Yes     Comment: moonshine, quart per day 39 years      Review of Systems  Constitutional: Negative for fever and chills.  HENT: Negative.   Eyes: Negative.   Respiratory: Negative.   Cardiovascular: Positive for chest pain.  Gastrointestinal: Negative.   Genitourinary: Negative.   Musculoskeletal: Negative.   Skin:  Negative.   Neurological:       Has some dysphasia from his old CVA.  Psychiatric/Behavioral:       Alcohol abuse.    Allergies  Codeine and Penicillins  Home Medications   Current Outpatient Rx  Name  Route  Sig  Dispense  Refill  . amLODipine (NORVASC) 5 MG tablet   Oral   Take 5 mg by mouth daily.         Marland Kitchen aspirin 325 MG tablet   Oral   Take 325 mg by mouth daily.         Marland Kitchen atorvastatin (LIPITOR) 20 MG tablet   Oral   Take 20 mg by mouth daily.         . carvedilol (COREG) 25 MG tablet   Oral   Take 25 mg by mouth 2 (two) times daily with a meal.         . clopidogrel (PLAVIX) 75 MG tablet   Oral   Take 75 mg by mouth daily.         . ramipril (ALTACE) 10 MG capsule   Oral   Take 20 mg by mouth daily.         . temazepam (RESTORIL) 15 MG capsule   Oral   Take 15 mg by mouth at bedtime as needed for sleep.         Marland Kitchen thiamine (VITAMIN B-1) 100 MG  tablet   Oral   Take 100 mg by mouth daily.         Marland Kitchen EXPIRED: atorvastatin (LIPITOR) 20 MG tablet   Oral   Take 1 tablet (20 mg total) by mouth daily at 6 PM.   30 tablet   0   . EXPIRED: carvedilol (COREG) 25 MG tablet   Oral   Take 1 tablet (25 mg total) by mouth 2 (two) times daily with a meal.   60 tablet   0   . EXPIRED: ramipril (ALTACE) 10 MG capsule   Oral   Take 2 capsules (20 mg total) by mouth daily.   60 capsule   0     BP 125/84  Pulse 78  Temp(Src) 97.8 F (36.6 C) (Oral)  Resp 18  SpO2 97%  Physical Exam  Nursing note and vitals reviewed. Constitutional: He is oriented to person, place, and time. He appears well-developed and well-nourished. No distress.  Slightly slurred speech.  HENT:  Head: Normocephalic and atraumatic.  Right Ear: External ear normal.  Left Ear: External ear normal.  Mouth/Throat: Oropharynx is clear and moist.  Eyes: Conjunctivae and EOM are normal. Pupils are equal, round, and reactive to light. No scleral icterus.  Neck: Normal range  of motion. Neck supple.  No carotid bruit.  Cardiovascular: Normal rate, regular rhythm and normal heart sounds.   Pulmonary/Chest: Effort normal and breath sounds normal.  AICD in right upper chest wall, not tender to palpation, no redness, heat, or swelling.  Abdominal: Soft. Bowel sounds are normal.  Musculoskeletal: Normal range of motion. He exhibits no edema and no tenderness.  Neurological: He is alert and oriented to person, place, and time.  Mild dysphasia.  No sensory or motor deficit.  Skin: Skin is warm and dry.  Psychiatric: He has a normal mood and affect. His behavior is normal.    ED Course  Procedures (including critical care time)  5:59 PM  Date: 10/02/2012  Rate:73  Rhythm: normal sinus rhythm and premature atrial contractions (PAC)  QRS Axis: normal  Intervals: normal QRS:  Left ventricular hypertrophy.  ST/T Wave abnormalities: normal  Conduction Disutrbances:none  Narrative Interpretation: Borderline EKG  Old EKG Reviewed: unchanged   9:15 PM Results for orders placed during the hospital encounter of 10/02/12  CBC WITH DIFFERENTIAL      Result Value Range   WBC 3.9 (*) 4.0 - 10.5 K/uL   RBC 3.41 (*) 4.22 - 5.81 MIL/uL   Hemoglobin 11.2 (*) 13.0 - 17.0 g/dL   HCT 11.9 (*) 14.7 - 82.9 %   MCV 93.5  78.0 - 100.0 fL   MCH 32.8  26.0 - 34.0 pg   MCHC 35.1  30.0 - 36.0 g/dL   RDW 56.2 (*) 13.0 - 86.5 %   Platelets 133 (*) 150 - 400 K/uL   Neutrophils Relative 39 (*) 43 - 77 %   Neutro Abs 1.5 (*) 1.7 - 7.7 K/uL   Lymphocytes Relative 52 (*) 12 - 46 %   Lymphs Abs 2.0  0.7 - 4.0 K/uL   Monocytes Relative 5  3 - 12 %   Monocytes Absolute 0.2  0.1 - 1.0 K/uL   Eosinophils Relative 3  0 - 5 %   Eosinophils Absolute 0.1  0.0 - 0.7 K/uL   Basophils Relative 1  0 - 1 %   Basophils Absolute 0.0  0.0 - 0.1 K/uL  COMPREHENSIVE METABOLIC PANEL      Result Value Range  Sodium 146 (*) 135 - 145 mEq/L   Potassium 3.7  3.5 - 5.1 mEq/L   Chloride 102  96 - 112  mEq/L   CO2 22  19 - 32 mEq/L   Glucose, Bld 61 (*) 70 - 99 mg/dL   BUN 15  6 - 23 mg/dL   Creatinine, Ser 1.61  0.50 - 1.35 mg/dL   Calcium 8.8  8.4 - 09.6 mg/dL   Total Protein 6.9  6.0 - 8.3 g/dL   Albumin 3.7  3.5 - 5.2 g/dL   AST 045 (*) 0 - 37 U/L   ALT 113 (*) 0 - 53 U/L   Alkaline Phosphatase 98  39 - 117 U/L   Total Bilirubin 0.3  0.3 - 1.2 mg/dL   GFR calc non Af Amer 78 (*) >90 mL/min   GFR calc Af Amer 90 (*) >90 mL/min  D-DIMER, QUANTITATIVE      Result Value Range   D-Dimer, Quant 2.49 (*) 0.00 - 0.48 ug/mL-FEU  ETHANOL      Result Value Range   Alcohol, Ethyl (B) 323 (*) 0 - 11 mg/dL  ACETAMINOPHEN LEVEL      Result Value Range   Acetaminophen (Tylenol), Serum <15.0  10 - 30 ug/mL  SALICYLATE LEVEL      Result Value Range   Salicylate Lvl <2.0 (*) 2.8 - 20.0 mg/dL  POCT I-STAT TROPONIN I      Result Value Range   Troponin i, poc 0.00  0.00 - 0.08 ng/mL   Comment 3            Ct Angio Chest Pe W/cm &/or Wo Cm  10/02/2012  *RADIOLOGY REPORT*  Clinical Data: Right anterior chest pain, free AICD, hypertension, CHF, elevated D-dimer, question pulmonary embolism  CT ANGIOGRAPHY CHEST  Technique:  Multidetector CT imaging of the chest using the standard protocol during bolus administration of intravenous contrast. Multiplanar reconstructed images including MIPs were obtained and reviewed to evaluate the vascular anatomy.  Contrast: 80mL OMNIPAQUE IOHEXOL 350 MG/ML SOLN  Comparison: None  Findings: Minimal dilatation of ascending thoracic aorta 4.2 x 3.8 cm image 41. No aortic dissection. Coronary arterial calcifications and minimal aortic calcification noted. Pulmonary arteries patent. No evidence of pulmonary embolism. No thoracic adenopathy. Marked fatty infiltration of liver.  Remaining visualized portions of upper abdomen normal appearance. Right chest wall AICD with lead within right ventricle. Small bleb in right upper lobe 2.1 cm diameter. Subsegmental atelectasis left  lower lobe. No acute infiltrate, pleural effusion or pneumothorax. No acute osseous findings.  IMPRESSION: No evidence of pulmonary embolism. No acute intrathoracic abnormalities. Marked fatty infiltration of liver. 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   Original Report Authenticated By: Ulyses Southward, M.D.    Dg Chest Port 1 View  10/02/2012  *RADIOLOGY REPORT*  Clinical Data: Sudden pain around AICD  PORTABLE CHEST - 1 VIEW  Comparison: Portable exam 1848 hours compared to 01/31/2012  Findings: Right subclavian AICD lead tip projects over right ventricle. Pulse generator right chest wall grossly unchanged. Normal heart size and pulmonary vascularity. Mildly tortuous thoracic aorta. Lungs clear. No pleural effusion, pneumothorax or acute osseous findings.  IMPRESSION: No acute abnormalities.   Original Report Authenticated By: Ulyses Southward, M.D.   Authenticated By: Ulyses Southward, M.D.    Course in ED:  Lab workup showed elevated D-dimer; will get CT angio of chest to check for DVT as cause of his chest pain.  ETOH was 323, probably  the cause of his altered mental status. Interrogation of AICD showed no episodes todayh. He had an episode of ventricular tachycardia of 155 on May 3 but did not receive a shock.  His battery is near replacement.  Call to Dr. Algie Coffer, covering for Dr. Sharyn Lull, pt's cardiologist.  He will have pt called in to setup having his AICD battery changed out.  I advised pt to stop drinking.  1. Chest wall pain   2. Alcohol intoxication            Carleene Cooper III, MD 10/03/12 1214

## 2012-10-02 NOTE — ED Notes (Signed)
Pt asked was he able to get a urine sample. Pt stated that he cannot at the moment. Will follow-up shortly

## 2012-10-02 NOTE — ED Notes (Signed)
Cardiologist: Sharyn Lull MD

## 2012-10-02 NOTE — ED Notes (Signed)
Patient reminded again to provide urine sample. Patient states "I'll do that when I'm damn well ready"

## 2012-10-02 NOTE — ED Notes (Signed)
Pt still unable to get a urine sample. Pt stated that he needed something to drink. Nurse made aware and stated that it was ok to give pt something to drink.

## 2012-10-03 ENCOUNTER — Encounter (HOSPITAL_COMMUNITY): Payer: Self-pay | Admitting: Emergency Medicine

## 2012-10-05 ENCOUNTER — Telehealth (HOSPITAL_COMMUNITY): Payer: Self-pay | Admitting: Emergency Medicine

## 2012-11-12 ENCOUNTER — Encounter: Payer: Self-pay | Admitting: Internal Medicine

## 2012-11-12 ENCOUNTER — Ambulatory Visit (INDEPENDENT_AMBULATORY_CARE_PROVIDER_SITE_OTHER): Payer: Medicare HMO | Admitting: Internal Medicine

## 2012-11-12 VITALS — BP 81/61 | HR 79 | Ht 75.0 in | Wt 165.0 lb

## 2012-11-12 DIAGNOSIS — I428 Other cardiomyopathies: Secondary | ICD-10-CM

## 2012-11-12 DIAGNOSIS — I5022 Chronic systolic (congestive) heart failure: Secondary | ICD-10-CM

## 2012-11-12 DIAGNOSIS — T82198A Other mechanical complication of other cardiac electronic device, initial encounter: Secondary | ICD-10-CM

## 2012-11-12 DIAGNOSIS — Z95810 Presence of automatic (implantable) cardiac defibrillator: Secondary | ICD-10-CM

## 2012-11-12 DIAGNOSIS — I10 Essential (primary) hypertension: Secondary | ICD-10-CM

## 2012-11-12 HISTORY — DX: Other mechanical complication of other cardiac electronic device, initial encounter: T82.198A

## 2012-11-12 LAB — ICD DEVICE OBSERVATION
BATTERY VOLTAGE: 2.65 V
BRDY-0002RV: 40 {beats}/min
BRDY-0004RV: 130 {beats}/min
CHARGE TIME: 11.5 s
DEV-0020ICD: NEGATIVE
RV LEAD AMPLITUDE: 5.4 mv
RV LEAD IMPEDENCE ICD: 528 Ohm
RV LEAD THRESHOLD: 1 V
TZAT-0001SLOWVT: 1
TZAT-0001SLOWVT: 2
TZAT-0001SLOWVT: 3
TZAT-0001SLOWVT: 4
TZAT-0001SLOWVT: 5
TZAT-0001SLOWVT: 6
TZAT-0018SLOWVT: NEGATIVE
TZAT-0018SLOWVT: NEGATIVE
TZAT-0018SLOWVT: NEGATIVE
TZAT-0018SLOWVT: NEGATIVE
TZAT-0018SLOWVT: NEGATIVE
TZAT-0018SLOWVT: NEGATIVE
TZON-0003SLOWVT: 329.6 ms
TZST-0001SLOWVT: 10
TZST-0001SLOWVT: 7
TZST-0001SLOWVT: 8
TZST-0001SLOWVT: 9
TZST-0003SLOWVT: 20 J
TZST-0003SLOWVT: 35 J
TZST-0003SLOWVT: 35 J
TZST-0003SLOWVT: 35 J
VENTRICULAR PACING ICD: 2.1 pct

## 2012-11-12 MED ORDER — AMLODIPINE BESYLATE 5 MG PO TABS: 5.0000 mg | ORAL_TABLET | Freq: Every day | ORAL | Status: DC

## 2012-11-12 NOTE — Progress Notes (Signed)
ELECTROPHYSIOLOGY CONSULT NOTE  Patient ID: [name] Phillips, MRN: [id number], DOB/AGE: 06/27/1955 57 y.o. Admit date: (Not on file) Date of Consult: 11/12/2012  Primary Physician: Pola Corn, MD Primary Cardiologist: AK   Chief Complaint:   ICD followup  HPI [name] Phillips is a 57 y.o. male   With nonischemic CM confirmed by cath 2005 and he subsequently underwent ICD implantation with a Medtronic defibrillator and a 6949-lead . This was done by Dr. 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 He is complaining of increasing exercise intolerance over 3-4 months; there has been no interval evaluation of LV function.  Continues to drink.   Past Medical History  Diagnosis Date  . CVA (cerebral vascular accident)   . Hypertension   . CHF (congestive heart failure)   . AICD (automatic cardioverter/defibrillator) present   . Seizure   . Pacemaker       Surgical History:  Past Surgical History  Procedure Laterality Date  . Cardiac defibrillator placement       Home Meds: Prior to Admission medications   Medication Sig Start Date End Date Taking? Authorizing Provider  amLODipine (NORVASC) 5 MG tablet Take 5 mg by mouth daily.    Historical Provider, MD  aspirin 325 MG tablet Take 325 mg by mouth daily.    Historical Provider, MD  atorvastatin (LIPITOR) 20 MG tablet Take 1 tablet (20 mg total) by mouth daily at 6 PM. 08/14/11 08/13/12  Cristal Ford, MD  atorvastatin (LIPITOR) 20 MG tablet Take 20 mg by mouth daily.    Historical Provider, MD  carvedilol (COREG) 25 MG tablet Take 1 tablet (25 mg total) by mouth 2 (two) times daily with a meal. 08/14/11 08/13/12  Cristal Ford, MD  carvedilol (COREG) 25 MG tablet Take 25 mg by mouth 2 (two) times daily  with a meal.    Historical Provider, MD  clopidogrel (PLAVIX) 75 MG tablet Take 75 mg by mouth daily.    Historical Provider, MD  ramipril (ALTACE) 10 MG capsule Take 2 capsules (20 mg total) by mouth daily. 08/14/11 08/13/12  Cristal Ford, MD  ramipril (ALTACE) 10 MG capsule Take 20 mg by mouth daily.    Historical Provider, MD  temazepam (RESTORIL) 15 MG capsule Take 15 mg by mouth at bedtime as needed for sleep.    Historical Provider, MD  thiamine (VITAMIN B-1) 100 MG tablet Take 100 mg by mouth daily.    Historical Provider, MD      Allergies:  Allergies  Allergen Reactions  . Codeine Hives  . Penicillins Hives    History   Social History  . Marital Status: Divorced    Spouse Name: N/A    Number of Children: N/A  . Years of Education: N/A   Occupational History  . Not on file.   Social History Main Topics  . Smoking status: Current Every Day Smoker -- 1.00 packs/day for 39 years  . Smokeless tobacco: Not on file  . Alcohol Use: Yes     Comment: moonshine, quart per day 39 years  . Drug Use: No  . Sexually Active:    Other Topics Concern  . Not on file   Social History Narrative  . No narrative on file     No  family history on file.   ROS:  Please see the history of present illness.   Negative except Ongoing alcohol use of  All other systems reviewed and negative.    Physical Exam:   BP 81/61  Pulse 79  Ht 6\' 3"  (1.905 m)  Wt 165 lb (74.844 kg)  BMI 20.62 kg/m2  General: Well developed, well nourished exceedingly thin male in no acute distress. Head: Normocephalic, atraumatic, sclera non-icteric, no xanthomas, nares are without discharge. EENT: normal Lymph Nodes:  none Back: without scoliosis/kyphosis , no CVA tendersness Neck: Negative for carotid bruits. JVD 8cm  Lungs: Clear bilaterally to auscultation without wheezes, rales, or rhonchi. Breathing is unlabored. Heart: RRR with S1 S2. No  murmur , rubs, or gallops appreciated. Abdomen: Soft,  non-tender, non-distended with normoactive bowel sounds. No hepatomegaly. No rebound/guarding. No obvious abdominal masses. Msk:  Strength and tone appear normal for age. Extremities: No clubbing or cyanosis. No   edema.  Distal pedal pulses are 2+ and equal bilaterally. Skin: Warm and Dry Neuro: Alert and oriented X 3. CN III-XII intact Grossly normal sensory and motor function . Psych:  Responds to questions appropriately with a normal affect.      Labs: Cardiac Enzymes No results found for this basename: CKTOTAL, CKMB, TROPONINI,  in the last 72 hours CBC Lab Results  Component Value Date   WBC 3.9* 10/02/2012   HGB 11.2* 10/02/2012   HCT 31.9* 10/02/2012   MCV 93.5 10/02/2012   PLT 133* 10/02/2012   PROTIME:  Chemistry  Lipids Lab Results  Component Value Date   CHOL 249* 08/09/2011   HDL 96 08/09/2011   LDLCALC 136* 08/09/2011   TRIG 83 08/09/2011   BNP Pro B Natriuretic peptide (BNP)  Date/Time Value Range Status  08/07/2011  8:51 AM 59.8  0 - 125 pg/mL Final  12/07/2009  4:15 AM 121.0* 0.0 - 100.0 pg/mL Final  12/05/2009  4:50 AM 46.0  0.0 - 100.0 pg/mL Final   Miscellaneous Lab Results  Component Value Date   DDIMER 2.49* 10/02/2012    Radiology/Studies:  No results found.  EKG:  Sinus rhythm at 79 intervals 18/09/38 Axis normal at 42% borderline LVH   Assessment and Plan:    Sherryl Manges

## 2012-11-12 NOTE — Assessment & Plan Note (Signed)
The patient has a 6949-lead which is intact. We will need to consider revision at the time of generator replacement.

## 2012-11-12 NOTE — Assessment & Plan Note (Signed)
Blood pressure was low today. He was without symptoms. He'll be followed up with Dr. Arnetha Courser about blood pressure and other medications

## 2012-11-12 NOTE — Assessment & Plan Note (Addendum)
The patient's device was interrogated.  The information was reviewed. No changes were made in the programming.    His device is approaching ERI but is not there yet. If LV function is normal, given the fact that he has a 6949 lead-in which will require further revision, it is reasonable to ask question as to whether device generator replacement in lead revision is necessary.

## 2012-11-12 NOTE — Assessment & Plan Note (Signed)
Patient has a nonischemic cardiomyopathy. He is on beta blockers and ACE inhibitors. Last measured ejection fraction was normal. He has had interval deterioration in exercise tolerance; it would be reasonable to consider repeat assessment of his ventricular function and possibly perfusion. I will defer this to Drs. AK and M H.

## 2012-11-12 NOTE — Patient Instructions (Signed)
Your physician recommends that you schedule a follow-up appointment in: 3 months with the device clinic.  Your physician wants you to follow-up in: 6 months with Dr. Graciela Husbands. You will receive a reminder letter in the mail two months in advance. If you don't receive a letter, please call our office to schedule the follow-up appointment.  Your physician recommends that you continue on your current medications as directed. Please refer to the Current Medication list given to you today.

## 2013-02-17 ENCOUNTER — Encounter: Payer: Self-pay | Admitting: Internal Medicine

## 2013-05-11 ENCOUNTER — Encounter: Payer: Medicare HMO | Admitting: Internal Medicine

## 2013-05-11 NOTE — Progress Notes (Deleted)
      Patient Care Team: Pola Corn, MD as PCP - General (Cardiology)   HPI  [name] Phillips is a 57 y.o. male Seen in followup for an ICD implanted years ago in the setting of nonischemic heart disease. His device is approaching ERI. He also has a 6949-lead in Pl.  Past Medical History  Diagnosis Date  . CVA (cerebral vascular accident)   . Hypertension   . CHF (congestive heart failure)   . AICD (automatic cardioverter/defibrillator) -medtronic     Date of implant 2007  . Seizure   . Nonischemic cardiomyopathy     Catheterization 2005 normal coronary arteries; EF 15-20%;  2011 EF normal    Past Surgical History  Procedure Laterality Date  . Cardiac defibrillator placement      Current Outpatient Prescriptions  Medication Sig Dispense Refill  . amLODipine (NORVASC) 5 MG tablet Take 1 tablet (5 mg total) by mouth daily. NEEDS A REFILL  30 tablet  6  . aspirin 325 MG tablet Take 325 mg by mouth daily.      Marland Kitchen atorvastatin (LIPITOR) 20 MG tablet Take 1 tablet (20 mg total) by mouth daily at 6 PM.  30 tablet  0  . carvedilol (COREG) 25 MG tablet Take 1 tablet (25 mg total) by mouth 2 (two) times daily with a meal.  60 tablet  0  . clopidogrel (PLAVIX) 75 MG tablet Take 75 mg by mouth daily.      . ramipril (ALTACE) 10 MG capsule Take 2 capsules (20 mg total) by mouth daily.  60 capsule  0  . temazepam (RESTORIL) 15 MG capsule Take 15 mg by mouth at bedtime as needed for sleep. NEEDS A REFILL      . thiamine (VITAMIN B-1) 100 MG tablet Take 100 mg by mouth daily.       No current facility-administered medications for this visit.    Allergies  Allergen Reactions  . Codeine Hives  . Penicillins Hives    Review of Systems negative except from HPI and PMH  Physical Exam There were no vitals taken for this visit. Well developed and well nourished in no acute distress HENT normal E scleral and icterus clear Neck Supple JVP flat; carotids brisk and full Clear to  ausculation {CARD RHYTHM:10874} ***Regular rate and rhythm, no murmurs gallops or rub Soft with active bowel sounds No clubbing cyanosis {Numbers; edema:17696} Edema Alert and oriented, grossly normal motor and sensory function Skin Warm and Dry    Assessment and  Plan

## 2013-05-13 ENCOUNTER — Encounter: Payer: Self-pay | Admitting: Internal Medicine

## 2013-06-21 ENCOUNTER — Encounter: Payer: Self-pay | Admitting: *Deleted

## 2013-06-29 ENCOUNTER — Encounter: Payer: Medicare HMO | Admitting: Internal Medicine

## 2013-06-29 NOTE — Progress Notes (Deleted)
      Patient Care Team: Pola Corn, MD as PCP - General (Cardiology)   HPI  [name] Phillips is a 58 y.o. male Seen in followup for an ICD implanted years ago in the setting of nonischemic heart disease. His device is approaching ERI. He also has a 6949-lead in Pl.  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  Echocardiogram 2011 demonstrated normal left ventricular function and a PFO  Past Medical History  Diagnosis Date  . CVA (cerebral vascular accident)   . Hypertension   . CHF (congestive heart failure)   . AICD (automatic cardioverter/defibrillator) -medtronic     Date of implant 2007  . Seizure   . Nonischemic cardiomyopathy     Catheterization 2005 normal coronary arteries; EF 15-20%;  2011 EF normal    Past Surgical History  Procedure Laterality Date  . Cardiac defibrillator placement      Current Outpatient Prescriptions  Medication Sig Dispense Refill  . amLODipine (NORVASC) 5 MG tablet Take 1 tablet (5 mg total) by mouth daily. NEEDS A REFILL  30 tablet  6  . aspirin 325 MG tablet Take 325 mg by mouth daily.      Marland Kitchen atorvastatin (LIPITOR) 20 MG tablet Take 1 tablet (20 mg total) by mouth daily at 6 PM.  30 tablet  0  . carvedilol (COREG) 25 MG tablet Take 1 tablet (25 mg total) by mouth 2 (two) times daily with a meal.  60 tablet  0  . clopidogrel (PLAVIX) 75 MG tablet Take 75 mg by mouth daily.      . ramipril (ALTACE) 10 MG capsule Take 2 capsules (20 mg total) by mouth daily.  60 capsule  0  . temazepam (RESTORIL) 15 MG capsule Take 15 mg by mouth at bedtime as needed for sleep. NEEDS A REFILL      . thiamine (VITAMIN B-1) 100 MG tablet Take 100 mg by mouth daily.       No current facility-administered medications for this visit.    Allergies  Allergen Reactions  . Codeine Hives  . Penicillins Hives    Review of Systems negative except from HPI and PMH  Physical  Exam There were no vitals taken for this visit. Well developed and well nourished in no acute distress HENT normal E scleral and icterus clear Neck Supple JVP flat; carotids brisk and full Clear to ausculation {CARD RHYTHM:10874} ***Regular rate and rhythm, no murmurs gallops or rub Soft with active bowel sounds No clubbing cyanosis {Numbers; edema:17696} Edema Alert and oriented, grossly normal motor and sensory function Skin Warm and Dry    Assessment and  Plan

## 2013-06-30 ENCOUNTER — Encounter: Payer: Self-pay | Admitting: Internal Medicine

## 2013-07-29 ENCOUNTER — Encounter: Payer: Medicare HMO | Admitting: Internal Medicine

## 2013-07-30 ENCOUNTER — Emergency Department (HOSPITAL_COMMUNITY): Payer: Medicare Other

## 2013-07-30 ENCOUNTER — Emergency Department (HOSPITAL_COMMUNITY)
Admission: EM | Admit: 2013-07-30 | Discharge: 2013-07-30 | Discharge status: Against Medical Advice | Payer: Medicare Other | Attending: Emergency Medicine | Admitting: Emergency Medicine

## 2013-07-30 ENCOUNTER — Encounter (HOSPITAL_COMMUNITY): Payer: Self-pay | Admitting: Emergency Medicine

## 2013-07-30 DIAGNOSIS — F17200 Nicotine dependence, unspecified, uncomplicated: Secondary | ICD-10-CM | POA: Insufficient documentation

## 2013-07-30 DIAGNOSIS — G8929 Other chronic pain: Secondary | ICD-10-CM | POA: Insufficient documentation

## 2013-07-30 DIAGNOSIS — Z8669 Personal history of other diseases of the nervous system and sense organs: Secondary | ICD-10-CM | POA: Insufficient documentation

## 2013-07-30 DIAGNOSIS — Z95810 Presence of automatic (implantable) cardiac defibrillator: Secondary | ICD-10-CM | POA: Insufficient documentation

## 2013-07-30 DIAGNOSIS — Z7902 Long term (current) use of antithrombotics/antiplatelets: Secondary | ICD-10-CM | POA: Insufficient documentation

## 2013-07-30 DIAGNOSIS — Z7982 Long term (current) use of aspirin: Secondary | ICD-10-CM | POA: Insufficient documentation

## 2013-07-30 DIAGNOSIS — Z79899 Other long term (current) drug therapy: Secondary | ICD-10-CM | POA: Insufficient documentation

## 2013-07-30 DIAGNOSIS — Z8673 Personal history of transient ischemic attack (TIA), and cerebral infarction without residual deficits: Secondary | ICD-10-CM | POA: Insufficient documentation

## 2013-07-30 DIAGNOSIS — M25519 Pain in unspecified shoulder: Secondary | ICD-10-CM | POA: Insufficient documentation

## 2013-07-30 DIAGNOSIS — Z880 Allergy status to penicillin: Secondary | ICD-10-CM | POA: Insufficient documentation

## 2013-07-30 DIAGNOSIS — F1010 Alcohol abuse, uncomplicated: Secondary | ICD-10-CM | POA: Insufficient documentation

## 2013-07-30 DIAGNOSIS — I10 Essential (primary) hypertension: Secondary | ICD-10-CM | POA: Insufficient documentation

## 2013-07-30 DIAGNOSIS — R079 Chest pain, unspecified: Secondary | ICD-10-CM | POA: Insufficient documentation

## 2013-07-30 DIAGNOSIS — I509 Heart failure, unspecified: Secondary | ICD-10-CM | POA: Insufficient documentation

## 2013-07-30 LAB — BASIC METABOLIC PANEL
BUN: 12 mg/dL (ref 6–23)
CO2: 30 mEq/L (ref 19–32)
Calcium: 9.4 mg/dL (ref 8.4–10.5)
Chloride: 102 mEq/L (ref 96–112)
Creatinine, Ser: 1.28 mg/dL (ref 0.50–1.35)
GFR calc Af Amer: 69 mL/min — ABNORMAL LOW (ref >90)
GFR calc non Af Amer: 59 mL/min — ABNORMAL LOW (ref >90)
Glucose, Bld: 119 mg/dL — ABNORMAL HIGH (ref 70–99)
Potassium: 3.6 mEq/L — ABNORMAL LOW (ref 3.7–5.3)
Sodium: 145 mEq/L (ref 137–147)

## 2013-07-30 LAB — CBC
HCT: 36.4 % — ABNORMAL LOW (ref 39.0–52.0)
Hemoglobin: 12.3 g/dL — ABNORMAL LOW (ref 13.0–17.0)
MCH: 31.7 pg (ref 26.0–34.0)
MCHC: 33.8 g/dL (ref 30.0–36.0)
MCV: 93.8 fL (ref 78.0–100.0)
Platelets: 154 10*3/uL (ref 150–400)
RBC: 3.88 MIL/uL — ABNORMAL LOW (ref 4.22–5.81)
RDW: 16.1 % — ABNORMAL HIGH (ref 11.5–15.5)
WBC: 4.5 10*3/uL (ref 4.0–10.5)

## 2013-07-30 NOTE — ED Notes (Signed)
After assessment, Bednar MD directed this RN that the Pt will not need and IV.

## 2013-07-30 NOTE — ED Notes (Signed)
Pt BIB EMS. Pt's family states that he had chest pain and they called EMS. Pt denies chest pain. Pt admits to drinking 2 bottles of "wild [place] rose" today. Pt has hx of CHF, HTN and has a defibrillator. EMS states pt has been non-compliant with his medications for the past 18 months. Pt arrives alert, no acute distress. Skin warm and dry. Wife with pt.

## 2013-07-30 NOTE — ED Provider Notes (Signed)
CSN: [id number]     Arrival date & time 07/30/13  1804 History   First MD Initiated Contact with Patient 07/30/13 1829     Chief Complaint  Patient presents with  . Chest Pain  . Alcohol Intoxication     (Consider location/radiation/quality/duration/timing/severity/associated sxs/prior Treatment) HPI [age] male presents with chronic chest pain syndrome. He states almost every day for several months if not longer he has a vague nonpleuritic mild left-sided nonradiating chest pain syndrome without associated symptoms present almost all day long for multiple hours per day but occasionally he will have days when he is pain-free but almost every day he has pain for most of the day over 6-12 hours at a time worse with certain torso position changes and occasionally worse with exertion but no fever no cough no shortness of breath no abdominal pain no back pain and is not having chest pain presently. He states he did fall a couple weeks ago and has had some mild right shoulder pain since that time but still has good range of motion of the shoulder with no distal weakness or numbness no swelling or deformity to the shoulder. That right shoulder pain is mild and present 24 hours a day for the last couple weeks. There is no other injury he is no headache no neck pain no back pain no focal weakness or numbness and no other concerns. Past Medical History  Diagnosis Date  . CVA (cerebral vascular accident)   . Hypertension   . CHF (congestive heart failure)   . AICD (automatic cardioverter/defibrillator) -medtronic     Date of implant 2007  . Seizure   . Nonischemic cardiomyopathy     Catheterization 2005 normal coronary arteries; EF 15-20%;  2011 EF normal   Past Surgical History  Procedure Laterality Date  . Cardiac defibrillator placement     No family history on file. History  Substance Use Topics  . Smoking status: Current Every Day Smoker -- 1.00 packs/day for 39 years  . Smokeless tobacco: Not  on file  . Alcohol Use: Yes     Comment: moonshine, quart per day 39 years    Review of Systems 10 Systems reviewed and are negative for acute change except as noted in the HPI.   Allergies  Codeine; Penicillins; and Prednisone  Home Medications   Current Outpatient Rx  Name  Route  Sig  Dispense  Refill  . amLODipine (NORVASC) 5 MG tablet   Oral   Take 1 tablet (5 mg total) by mouth daily.   30 tablet   0   . aspirin 325 MG tablet   Oral   Take 325 mg by mouth daily.         Marland Kitchen. atorvastatin (LIPITOR) 10 MG tablet   Oral   Take 2 tablets (20 mg total) by mouth daily.   60 tablet   0   . carvedilol (COREG) 25 MG tablet   Oral   Take 1 tablet (25 mg total) by mouth 2 (two) times daily with a meal.   60 tablet   0   . clopidogrel (PLAVIX) 75 MG tablet   Oral   Take 1 tablet (75 mg total) by mouth daily with breakfast.   30 tablet   0   . ramipril (ALTACE) 10 MG capsule   Oral   Take 1 capsule (10 mg total) by mouth daily.   30 capsule   0    BP 146/109  Pulse 114  Temp(Src) 98.2  F (36.8 C) (Oral)  Resp 18  SpO2 98% Physical Exam  Nursing note and vitals reviewed. Constitutional:  Awake, alert, nontoxic appearance.  HENT:  Head: Atraumatic.  Eyes: Right eye exhibits no discharge. Left eye exhibits no discharge.  Neck: Neck supple.  Cardiovascular: Normal rate and regular rhythm.   No murmur heard. Pulmonary/Chest: Effort normal and breath sounds normal. No respiratory distress. He has no wheezes. He has no rales. He exhibits no tenderness.  Abdominal: Soft. Bowel sounds are normal. He exhibits no distension. There is no tenderness. There is no rebound.  Musculoskeletal: He exhibits tenderness. He exhibits no edema.  Baseline ROM, no obvious new focal weakness. Left arm and both legs nontender. Right arm is minimal tenderness to the right shoulder with negative drop test and good right shoulder range of motion no tenderness to the right elbow wrist  or hand right hand has capillary refill less than 2 seconds with normal light touch as well as intact 5 out of 5 motor strength in the distributions of the median radial and ulnar nerve function  Neurological: He is alert.  Mental status and motor strength appears baseline for patient and situation.  Skin: No rash noted.  Psychiatric: He has a normal mood and affect.    ED Course  Procedures (including critical care time) Pt eloped with family. Labs Review Labs Reviewed  CBC - Abnormal; Notable for the following:    RBC 3.88 (*)    Hemoglobin 12.3 (*)    HCT 36.4 (*)    RDW 16.1 (*)    All other components within normal limits  BASIC METABOLIC PANEL - Abnormal; Notable for the following:    Potassium 3.6 (*)    Glucose, Bld 119 (*)    GFR calc non Af Amer 59 (*)    GFR calc Af Amer 69 (*)    All other components within normal limits  I-STAT TROPOININ, ED      EKG Interpretation   Date/Time:  Friday July 30 2013 18:14:27 EST Ventricular Rate:  106 PR Interval:  179 QRS Duration: 93 QT Interval:  347 QTC Calculation: 461 R Axis:   42 Text Interpretation:  Sinus tachycardia Probable left atrial enlargement  LVH with secondary repolarization abnormality Anterior Q waves, possibly  due to LVH Baseline wander in lead(s) V2 Compared to previous tracing ST  depression in Lateral leads NOW PRESENT Compared to 09May2014 but similar  to 14Mar2013 and 24Jan2005 Confirmed by [facility]  MD, Jonny Ruiz ([id number]) on  07/30/2013 6:37:48 PM      MDM   Final diagnoses:  Chronic chest pain    Pt eloped.    Hurman Horn, MD 08/01/13 [phone]

## 2013-07-31 ENCOUNTER — Emergency Department (HOSPITAL_COMMUNITY)
Admission: EM | Admit: 2013-07-31 | Discharge: 2013-07-31 | Discharge status: Against Medical Advice | Payer: Medicare Other | Attending: Emergency Medicine | Admitting: Emergency Medicine

## 2013-07-31 ENCOUNTER — Encounter (HOSPITAL_COMMUNITY): Payer: Self-pay | Admitting: Emergency Medicine

## 2013-07-31 DIAGNOSIS — Z8669 Personal history of other diseases of the nervous system and sense organs: Secondary | ICD-10-CM | POA: Insufficient documentation

## 2013-07-31 DIAGNOSIS — Z9889 Other specified postprocedural states: Secondary | ICD-10-CM | POA: Insufficient documentation

## 2013-07-31 DIAGNOSIS — Z760 Encounter for issue of repeat prescription: Secondary | ICD-10-CM | POA: Insufficient documentation

## 2013-07-31 DIAGNOSIS — Z91199 Patient's noncompliance with other medical treatment and regimen due to unspecified reason: Secondary | ICD-10-CM | POA: Insufficient documentation

## 2013-07-31 DIAGNOSIS — Z8673 Personal history of transient ischemic attack (TIA), and cerebral infarction without residual deficits: Secondary | ICD-10-CM | POA: Insufficient documentation

## 2013-07-31 DIAGNOSIS — I509 Heart failure, unspecified: Secondary | ICD-10-CM | POA: Insufficient documentation

## 2013-07-31 DIAGNOSIS — Z880 Allergy status to penicillin: Secondary | ICD-10-CM | POA: Insufficient documentation

## 2013-07-31 DIAGNOSIS — I10 Essential (primary) hypertension: Secondary | ICD-10-CM | POA: Insufficient documentation

## 2013-07-31 DIAGNOSIS — Z79899 Other long term (current) drug therapy: Secondary | ICD-10-CM | POA: Insufficient documentation

## 2013-07-31 DIAGNOSIS — Z9119 Patient's noncompliance with other medical treatment and regimen: Secondary | ICD-10-CM | POA: Insufficient documentation

## 2013-07-31 DIAGNOSIS — Z95810 Presence of automatic (implantable) cardiac defibrillator: Secondary | ICD-10-CM | POA: Insufficient documentation

## 2013-07-31 DIAGNOSIS — Z7982 Long term (current) use of aspirin: Secondary | ICD-10-CM | POA: Insufficient documentation

## 2013-07-31 DIAGNOSIS — F17200 Nicotine dependence, unspecified, uncomplicated: Secondary | ICD-10-CM | POA: Insufficient documentation

## 2013-07-31 DIAGNOSIS — R319 Hematuria, unspecified: Secondary | ICD-10-CM | POA: Insufficient documentation

## 2013-07-31 DIAGNOSIS — Z7902 Long term (current) use of antithrombotics/antiplatelets: Secondary | ICD-10-CM | POA: Insufficient documentation

## 2013-07-31 LAB — I-STAT TROPONIN, ED: Troponin i, poc: 0 ng/mL (ref 0.00–0.08)

## 2013-07-31 MED ORDER — CARVEDILOL 25 MG PO TABS: 25.0000 mg | ORAL_TABLET | Freq: Two times a day (BID) | ORAL | Status: DC

## 2013-07-31 MED ORDER — RAMIPRIL 10 MG PO CAPS: 10.0000 mg | ORAL_CAPSULE | Freq: Every day | ORAL | Status: DC

## 2013-07-31 MED ORDER — ATORVASTATIN CALCIUM 10 MG PO TABS: 20.0000 mg | ORAL_TABLET | Freq: Every day | ORAL | Status: DC

## 2013-07-31 MED ORDER — AMLODIPINE BESYLATE 5 MG PO TABS: 5.0000 mg | ORAL_TABLET | Freq: Every day | ORAL | Status: DC

## 2013-07-31 MED ORDER — CLOPIDOGREL BISULFATE 75 MG PO TABS: 75.0000 mg | ORAL_TABLET | Freq: Every day | ORAL | Status: DC

## 2013-07-31 NOTE — ED Provider Notes (Addendum)
CSN: [id number]     Arrival date & time 07/31/13  1241 History   First MD Initiated Contact with Patient 07/31/13 1300     Chief Complaint  Patient presents with  . Hematuria     (Consider location/radiation/quality/duration/timing/severity/associated sxs/prior Treatment) HPI Comments: Patient presents with hematuria. He states about 40 minutes ago he had an episode of blood in his urine. He denies abdominal pain. He denies any burning on urination. He denies any past episodes of hematuria. He denies any penile discharge. He also was here yesterday for chest pain. He left prior to completion of evaluation. He states he has chest pain every day and it's unchanged from his baseline. He denies any shortness of breath. He denies any cough or chest congestion. He denies he fevers or chills. He does have a history of alcohol abuse. He also has a history of a past CVA and seizures. He has an AICD placed. He's very noncompliant with his medications and states he hasn't seen a doctor in the last several months and hasn't taken his medications in about 18 months.  Patient is a 58 y.o. male presenting with hematuria.  Hematuria Associated symptoms include chest pain. Pertinent negatives include no abdominal pain, no headaches and no shortness of breath.    Past Medical History  Diagnosis Date  . CVA (cerebral vascular accident)   . Hypertension   . CHF (congestive heart failure)   . AICD (automatic cardioverter/defibrillator) -medtronic     Date of implant 2007  . Seizure   . Nonischemic cardiomyopathy     Catheterization 2005 normal coronary arteries; EF 15-20%;  2011 EF normal   Past Surgical History  Procedure Laterality Date  . Cardiac defibrillator placement     No family history on file. History  Substance Use Topics  . Smoking status: Current Every Day Smoker -- 1.00 packs/day for 39 years  . Smokeless tobacco: Not on file  . Alcohol Use: Yes     Comment: moonshine, quart per day 39  years    Review of Systems  Constitutional: Negative for fever, chills, diaphoresis and fatigue.  HENT: Negative for congestion, rhinorrhea and sneezing.   Eyes: Negative.   Respiratory: Negative for cough, chest tightness and shortness of breath.   Cardiovascular: Positive for chest pain. Negative for leg swelling.  Gastrointestinal: Negative for nausea, vomiting, abdominal pain, diarrhea and blood in stool.  Genitourinary: Positive for hematuria. Negative for frequency, flank pain and difficulty urinating.  Musculoskeletal: Positive for arthralgias. Negative for back pain.  Skin: Negative for rash.  Neurological: Negative for dizziness, speech difficulty, weakness, numbness and headaches.      Allergies  Codeine; Penicillins; and Prednisone  Home Medications   Current Outpatient Rx  Name  Route  Sig  Dispense  Refill  . aspirin 325 MG tablet   Oral   Take 325 mg by mouth daily.         Marland Kitchen amLODipine (NORVASC) 5 MG tablet   Oral   Take 1 tablet (5 mg total) by mouth daily.   30 tablet   0   . atorvastatin (LIPITOR) 10 MG tablet   Oral   Take 2 tablets (20 mg total) by mouth daily.   60 tablet   0   . carvedilol (COREG) 25 MG tablet   Oral   Take 1 tablet (25 mg total) by mouth 2 (two) times daily with a meal.   60 tablet   0   . clopidogrel (PLAVIX) 75 MG  tablet   Oral   Take 1 tablet (75 mg total) by mouth daily with breakfast.   30 tablet   0   . ramipril (ALTACE) 10 MG capsule   Oral   Take 1 capsule (10 mg total) by mouth daily.   30 capsule   0    BP 172/131  Pulse 115  Temp(Src) 98.3 F (36.8 C) (Oral)  Resp 16  SpO2 98% Physical Exam  Constitutional: He is oriented to person, place, and time. He appears well-developed and well-nourished.  HENT:  Head: Normocephalic and atraumatic.  Eyes: Pupils are equal, round, and reactive to light.  Neck: Normal range of motion. Neck supple.  Cardiovascular: Normal rate, regular rhythm and normal  heart sounds.   Pulmonary/Chest: Effort normal and breath sounds normal. No respiratory distress. He has no wheezes. He has no rales. He exhibits tenderness (Reproducible tenderness to the Center for chest).  Abdominal: Soft. Bowel sounds are normal. There is no tenderness. There is no rebound and no guarding.  Musculoskeletal: Normal range of motion. He exhibits no edema.  Lymphadenopathy:    He has no cervical adenopathy.  Neurological: He is alert and oriented to person, place, and time.  The extremities symmetrically. Normal sensation to light touch in all extremities. He seems to have some slight facial drooping but he and his wife status is baseline for him.  Skin: Skin is warm and dry. No rash noted.  Psychiatric: He has a normal mood and affect.    ED Course  Procedures (including critical care time) Labs Review Labs Reviewed  URINALYSIS, ROUTINE W REFLEX MICROSCOPIC  I-STAT TROPOININ, ED   Imaging Review No results found.   EKG Interpretation None       Date: 07/31/2013  Rate: 119  Rhythm: sinus tachycardia  QRS Axis: normal  Intervals: PR prolonged  ST/T Wave abnormalities: nonspecific ST/T changes  Conduction Disutrbances:none  Narrative Interpretation:   Old EKG Reviewed: unchanged   MDM   Final diagnoses:  Hematuria  Hypertension    Patient presents with hematuria. We are still awaiting urinalysis. He initially did give a urine sample but per the tech it was diluted and ice cold, so it was felt that he diluted with water. We are waiting for a followup urine. He doesn't appear to have any other acute issues. He has some chronic appearing chest discomfort which is unchanged from his baseline. I did check a troponin which was negative. His EKG is unchanged from his prior EKGs. He is noncompliant with following up with his primary care physician and therefore doesn't have any prescriptions for his blood pressure medications. I did give him a refill for his  medications. He does have a long discussion about importance of outpatient followup and medication compliance.  While awaiting urine collection, pt eloped.  Left prior to getting prescriptions.    Rolan BuccoMelanie Lynnix Schoneman, MD 07/31/13 [id number]  Rolan BuccoMelanie Kristian Mogg, MD 07/31/13 [phone]

## 2013-07-31 NOTE — ED Notes (Addendum)
Pt and family member not in room. Staff looked in bathroom; pt not found. Pt gown on bed. EDP Belfi made aware.

## 2013-07-31 NOTE — ED Notes (Signed)
Bed: ZY60 Expected date:  Expected time:  Means of arrival:  Comments: hematuria

## 2013-07-31 NOTE — Discharge Instructions (Signed)
Hematuria, Adult Hematuria is blood in your urine. It can be caused by a bladder infection, kidney infection, prostate infection, kidney stone, or cancer of your urinary tract. Infections can usually be treated with medicine, and a kidney stone usually will pass through your urine. If neither of these is the cause of your hematuria, further workup to find out the reason may be needed. It is very important that you tell your health care provider about any blood you see in your urine, even if the blood stops without treatment or happens without causing pain. Blood in your urine that happens and then stops and then happens again can be a symptom of a very serious condition. Also, pain is not a symptom in the initial stages of many urinary cancers. HOME CARE INSTRUCTIONS   Drink lots of fluid, 3 4 quarts a day. If you have been diagnosed with an infection, cranberry juice is especially recommended, in addition to large amounts of water.  Avoid caffeine, tea, and carbonated beverages, because they tend to irritate the bladder.  Avoid alcohol because it may irritate the prostate.  Only take over-the-counter or prescription medicines for pain, discomfort, or fever as directed by your health care provider.  If you have been diagnosed with a kidney stone, follow your health care provider's instructions regarding straining your urine to catch the stone.  Empty your bladder often. Avoid holding urine for long periods of time.  After a bowel movement, women should cleanse front to back. Use each tissue only once.  Empty your bladder before and after sexual intercourse if you are a male. SEEK MEDICAL CARE IF: You develop back pain, fever, a feeling of sickness in your stomach (nausea), or vomiting or if your symptoms are not better in 3 days. Return sooner if you are getting worse. SEEK IMMEDIATE MEDICAL CARE IF:   You have a persistent fever, with a temperature of 101.107F (38.8C) or greater.  You  develop severe vomiting and are unable to keep the medicine down.  You develop severe back or abdominal pain despite taking your medicines.  You begin passing a large amount of blood or clots in your urine.  You feel extremely weak or faint, or you pass out. MAKE SURE YOU:   Understand these instructions.  Will watch your condition.  Will get help right away if you are not doing well or get worse. Document Released: 05/13/2005 Document Revised: 03/03/2013 Document Reviewed: 01/11/2013 [facility] Patient Information 2014 [place], [place].  Arterial Hypertension Arterial hypertension (high blood pressure) is a condition of elevated pressure in your blood vessels. Hypertension over a long period of time is a risk factor for strokes, heart attacks, and heart failure. It is also the leading cause of kidney (renal) failure.  CAUSES   In Adults -- Over 90% of all hypertension has no known cause. This is called essential or primary hypertension. In the other 10% of people with hypertension, the increase in blood pressure is caused by another disorder. This is called secondary hypertension. Important causes of secondary hypertension are:  Heavy alcohol use.  Obstructive sleep apnea.  Hyperaldosterosim (Conn's syndrome).  Steroid use.  Chronic kidney failure.  Hyperparathyroidism.  Medications.  Renal artery stenosis.  Pheochromocytoma.  Cushing's disease.  Coarctation of the aorta.  Scleroderma renal crisis.  Licorice (in excessive amounts).  Drugs (cocaine, methamphetamine). Your caregiver can explain any items above that apply to you.  In Children -- Secondary hypertension is more common and should always be considered.  Pregnancy --  Few women of childbearing age have high blood pressure. However, up to 10% of them develop hypertension of pregnancy. Generally, this will not harm the woman. It may be a sign of 3 complications of pregnancy: preeclampsia, HELLP syndrome, and  eclampsia. Follow up and control with medication is necessary. SYMPTOMS   This condition normally does not produce any noticeable symptoms. It is usually found during a routine exam.  Malignant hypertension is a late problem of high blood pressure. It may have the following symptoms:  Headaches.  Blurred vision.  End-organ damage (this means your kidneys, heart, lungs, and other organs are being damaged).  Stressful situations can increase the blood pressure. If a person with normal blood pressure has their blood pressure go up while being seen by their caregiver, this is often termed "white coat hypertension." Its importance is not known. It may be related with eventually developing hypertension or complications of hypertension.  Hypertension is often confused with mental tension, stress, and anxiety. DIAGNOSIS  The diagnosis is made by 3 separate blood pressure measurements. They are taken at least 1 week apart from each other. If there is organ damage from hypertension, the diagnosis may be made without repeat measurements. Hypertension is usually identified by having blood pressure readings:  Above 140/90 mmHg measured in both arms, at 3 separate times, over a couple weeks.  Over 130/80 mmHg should be considered a risk factor and may require treatment in patients with diabetes. Blood pressure readings over 120/80 mmHg are called "pre-hypertension" even in non-diabetic patients. To get a true blood pressure measurement, use the following guidelines. Be aware of the factors that can alter blood pressure readings.  Take measurements at least 1 hour after caffeine.  Take measurements 30 minutes after smoking and without any stress. This is another reason to quit smoking  it raises your blood pressure.  Use a proper cuff size. Ask your caregiver if you are not sure about your cuff size.  Most home blood pressure cuffs are automatic. They will measure systolic and diastolic pressures.  The systolic pressure is the pressure reading at the start of sounds. Diastolic pressure is the pressure at which the sounds disappear. If you are elderly, measure pressures in multiple postures. Try sitting, lying or standing.  Sit at rest for a minimum of 5 minutes before taking measurements.  You should not be on any medications like decongestants. These are found in many cold medications.  Record your blood pressure readings and review them with your caregiver. If you have hypertension:  Your caregiver may do tests to be sure you do not have secondary hypertension (see "causes" above).  Your caregiver may also look for signs of metabolic syndrome. This is also called Syndrome X or Insulin Resistance Syndrome. You may have this syndrome if you have type 2 diabetes, abdominal obesity, and abnormal blood lipids in addition to hypertension.  Your caregiver will take your medical and family history and perform a physical exam.  Diagnostic tests may include blood tests (for glucose, cholesterol, potassium, and kidney function), a urinalysis, or an EKG. Other tests may also be necessary depending on your condition. PREVENTION  There are important lifestyle issues that you can adopt to reduce your chance of developing hypertension:  Maintain a normal weight.  Limit the amount of salt (sodium) in your diet.  Exercise often.  Limit alcohol intake.  Get enough potassium in your diet. Discuss specific advice with your caregiver.  Follow a DASH diet (dietary approaches to stop  hypertension). This diet is rich in fruits, vegetables, and low-fat dairy products, and avoids certain fats. PROGNOSIS  Essential hypertension cannot be cured. Lifestyle changes and medical treatment can lower blood pressure and reduce complications. The prognosis of secondary hypertension depends on the underlying cause. Many people whose hypertension is controlled with medicine or lifestyle changes can live a normal,  healthy life.  RISKS AND COMPLICATIONS  While high blood pressure alone is not an illness, it often requires treatment due to its short- and long-term effects on many organs. Hypertension increases your risk for:  CVAs or strokes (cerebrovascular accident).  Heart failure due to chronically high blood pressure (hypertensive cardiomyopathy).  Heart attack (myocardial infarction).  Damage to the retina (hypertensive retinopathy).  Kidney failure (hypertensive nephropathy). Your caregiver can explain list items above that apply to you. Treatment of hypertension can significantly reduce the risk of complications. TREATMENT   For overweight patients, weight loss and regular exercise are recommended. Physical fitness lowers blood pressure.  Mild hypertension is usually treated with diet and exercise. A diet rich in fruits and vegetables, fat-free dairy products, and foods low in fat and salt (sodium) can help lower blood pressure. Decreasing salt intake decreases blood pressure in a 1/3 of people.  Stop smoking if you are a smoker. The steps above are highly effective in reducing blood pressure. While these actions are easy to suggest, they are difficult to achieve. Most patients with moderate or severe hypertension end up requiring medications to bring their blood pressure down to a normal level. There are several classes of medications for treatment. Blood pressure pills (antihypertensives) will lower blood pressure by their different actions. Lowering the blood pressure by 10 mmHg may decrease the risk of complications by as much as 25%. The goal of treatment is effective blood pressure control. This will reduce your risk for complications. Your caregiver will help you determine the best treatment for you according to your lifestyle. What is excellent treatment for one person, may not be for you. HOME CARE INSTRUCTIONS   Do not smoke.  Follow the lifestyle changes outlined in the "Prevention"  section.  If you are on medications, follow the directions carefully. Blood pressure medications must be taken as prescribed. Skipping doses reduces their benefit. It also puts you at risk for problems.  Follow up with your caregiver, as directed.  If you are asked to monitor your blood pressure at home, follow the guidelines in the "Diagnosis" section above. SEEK MEDICAL CARE IF:   You think you are having medication side effects.  You have recurrent headaches or lightheadedness.  You have swelling in your ankles.  You have trouble with your vision. SEEK IMMEDIATE MEDICAL CARE IF:   You have sudden onset of chest pain or pressure, difficulty breathing, or other symptoms of a heart attack.  You have a severe headache.  You have symptoms of a stroke (such as sudden weakness, difficulty speaking, difficulty walking). MAKE SURE YOU:   Understand these instructions.  Will watch your condition.  Will get help right away if you are not doing well or get worse. Document Released: 05/13/2005 Document Revised: 08/05/2011 Document Reviewed: 12/11/2006 [facility] Patient Information 2014 [place], [place].

## 2013-07-31 NOTE — ED Notes (Signed)
I notified pt that we needed a urine sample, that his other sample was rejected. He tried to urinate but had no success. Will retry in a few.

## 2013-07-31 NOTE — ED Notes (Signed)
Patient asked to provide another specimen per MD [facility]. Patient walkled to restroom, with wife, unable to use restroom. Asked for something to drink.

## 2013-07-31 NOTE — ED Notes (Signed)
Pt reports one episode of blood in urine this am. Pt denies other complaint at present time. Pt a/o x4.

## 2013-07-31 NOTE — ED Notes (Signed)
Patient used restroom, handed me the urine specimen, the specimen was ice cold as patient more than likely added water to specimen. RN Lequita Halt notified

## 2013-07-31 NOTE — ED Notes (Addendum)
Per EMS, pt from home.  Pt was here yesterday... States he was here at that time for heart issues.  Today pt comes in with hematuria. Just happened once about ago. Pt also states out of meds for 18 months. Pt has been drinking this am and made to stop drinking for transport.  No pain.  No increase in urination.  No vomiting.  Vitals: 160/100, hr 120, resp 18, cbg 123

## 2013-08-01 ENCOUNTER — Emergency Department (HOSPITAL_COMMUNITY)
Admission: EM | Admit: 2013-08-01 | Discharge: 2013-08-01 | Disposition: A | Discharge status: Home / Self Care | Payer: Medicare Other | Attending: Emergency Medicine | Admitting: Emergency Medicine

## 2013-08-01 ENCOUNTER — Encounter (HOSPITAL_COMMUNITY): Payer: Self-pay | Admitting: Emergency Medicine

## 2013-08-01 DIAGNOSIS — F17200 Nicotine dependence, unspecified, uncomplicated: Secondary | ICD-10-CM | POA: Insufficient documentation

## 2013-08-01 DIAGNOSIS — I10 Essential (primary) hypertension: Secondary | ICD-10-CM | POA: Insufficient documentation

## 2013-08-01 DIAGNOSIS — Z79899 Other long term (current) drug therapy: Secondary | ICD-10-CM | POA: Insufficient documentation

## 2013-08-01 DIAGNOSIS — Z7902 Long term (current) use of antithrombotics/antiplatelets: Secondary | ICD-10-CM | POA: Insufficient documentation

## 2013-08-01 DIAGNOSIS — Z880 Allergy status to penicillin: Secondary | ICD-10-CM | POA: Insufficient documentation

## 2013-08-01 DIAGNOSIS — Z7982 Long term (current) use of aspirin: Secondary | ICD-10-CM | POA: Insufficient documentation

## 2013-08-01 DIAGNOSIS — Z95810 Presence of automatic (implantable) cardiac defibrillator: Secondary | ICD-10-CM | POA: Insufficient documentation

## 2013-08-01 DIAGNOSIS — Z8673 Personal history of transient ischemic attack (TIA), and cerebral infarction without residual deficits: Secondary | ICD-10-CM | POA: Insufficient documentation

## 2013-08-01 DIAGNOSIS — Z8669 Personal history of other diseases of the nervous system and sense organs: Secondary | ICD-10-CM | POA: Insufficient documentation

## 2013-08-01 DIAGNOSIS — R040 Epistaxis: Secondary | ICD-10-CM | POA: Insufficient documentation

## 2013-08-01 DIAGNOSIS — R000 Tachycardia, unspecified: Secondary | ICD-10-CM | POA: Insufficient documentation

## 2013-08-01 DIAGNOSIS — I509 Heart failure, unspecified: Secondary | ICD-10-CM | POA: Insufficient documentation

## 2013-08-01 LAB — URINALYSIS, ROUTINE W REFLEX MICROSCOPIC
Bilirubin Urine: NEGATIVE
Glucose, UA: NEGATIVE mg/dL
Ketones, ur: NEGATIVE mg/dL
Leukocytes, UA: NEGATIVE
Nitrite: NEGATIVE
Protein, ur: 100 mg/dL — AB
Specific Gravity, Urine: 1.015 (ref 1.005–1.030)
Urobilinogen, UA: 1 mg/dL (ref 0.0–1.0)
pH: 7 (ref 5.0–8.0)

## 2013-08-01 LAB — URINE MICROSCOPIC-ADD ON

## 2013-08-01 MED ORDER — RAMIPRIL 10 MG PO CAPS
10.0000 mg | ORAL_CAPSULE | Freq: Every day | ORAL | Status: DC
  Administered 2013-08-01: 10 mg via ORAL
  Filled 2013-08-01: qty 1

## 2013-08-01 MED ORDER — AMLODIPINE BESYLATE 5 MG PO TABS: 5.0000 mg | ORAL_TABLET | Freq: Every day | ORAL | Status: DC

## 2013-08-01 MED ORDER — RAMIPRIL 10 MG PO CAPS: 10.0000 mg | ORAL_CAPSULE | Freq: Every day | ORAL | Status: DC

## 2013-08-01 MED ORDER — AMLODIPINE BESYLATE 5 MG PO TABS
5.0000 mg | ORAL_TABLET | Freq: Every day | ORAL | Status: DC
  Administered 2013-08-01: 5 mg via ORAL
  Filled 2013-08-01: qty 1

## 2013-08-01 MED ORDER — CARVEDILOL 25 MG PO TABS
25.0000 mg | ORAL_TABLET | Freq: Once | ORAL | Status: AC
  Administered 2013-08-01: 25 mg via ORAL
  Filled 2013-08-01: qty 1

## 2013-08-01 MED ORDER — ATORVASTATIN CALCIUM 10 MG PO TABS: 20.0000 mg | ORAL_TABLET | Freq: Every day | ORAL | Status: DC

## 2013-08-01 MED ORDER — ONDANSETRON 4 MG PO TBDP
4.0000 mg | ORAL_TABLET | Freq: Once | ORAL | Status: AC
  Administered 2013-08-01: 4 mg via ORAL
  Filled 2013-08-01: qty 1

## 2013-08-01 MED ORDER — PHENYLEPHRINE HCL 0.5 % NA SOLN
1.0000 [drp] | Freq: Once | NASAL | Status: AC
  Administered 2013-08-01: 1 [drp] via NASAL
  Filled 2013-08-01: qty 15

## 2013-08-01 MED ORDER — CARVEDILOL 25 MG PO TABS: 25.0000 mg | ORAL_TABLET | Freq: Two times a day (BID) | ORAL | Status: DC

## 2013-08-01 NOTE — ED Notes (Signed)
Bed: [facility] Expected date:  Expected time:  Means of arrival:  Comments: 58 y/o Nose bleed that has stopped

## 2013-08-01 NOTE — ED Notes (Signed)
Pt actively vomiting in trash can. Small amt of brownish, emesis in trash can. Dr Anitra Lauth notified and cameto pt bedside. Pt given Zofran per order. Pt now resting comfortably in bed

## 2013-08-01 NOTE — Discharge Instructions (Signed)
Arterial Hypertension °Arterial hypertension (high blood pressure) is a condition of elevated pressure in your blood vessels. Hypertension over a long period of time is a risk factor for strokes, heart attacks, and heart failure. It is also the leading cause of kidney (renal) failure.  °CAUSES  °· In Adults -- Over 90% of all hypertension has no known cause. This is called essential or primary hypertension. In the other 10% of people with hypertension, the increase in blood pressure is caused by another disorder. This is called secondary hypertension. Important causes of secondary hypertension are: °· Heavy alcohol use. °· Obstructive sleep apnea. °· Hyperaldosterosim (Conn's syndrome). °· Steroid use. °· Chronic kidney failure. °· Hyperparathyroidism. °· Medications. °· Renal artery stenosis. °· Pheochromocytoma. °· Cushing's disease. °· Coarctation of the aorta. °· Scleroderma renal crisis. °· Licorice (in excessive amounts). °· Drugs (cocaine, methamphetamine). °Your caregiver can explain any items above that apply to you. °· In Children -- Secondary hypertension is more common and should always be considered. °· Pregnancy -- Few women of childbearing age have high blood pressure. However, up to 10% of them develop hypertension of pregnancy. Generally, this will not harm the woman. It may be a sign of 3 complications of pregnancy: preeclampsia, HELLP syndrome, and eclampsia. Follow up and control with medication is necessary. °SYMPTOMS  °· This condition normally does not produce any noticeable symptoms. It is usually found during a routine exam. °· Malignant hypertension is a late problem of high blood pressure. It may have the following symptoms: °· Headaches. °· Blurred vision. °· End-organ damage (this means your kidneys, heart, lungs, and other organs are being damaged). °· Stressful situations can increase the blood pressure. If a person with normal blood pressure has their blood pressure go up while being  seen by their caregiver, this is often termed "white coat hypertension." Its importance is not known. It may be related with eventually developing hypertension or complications of hypertension. °· Hypertension is often confused with mental tension, stress, and anxiety. °DIAGNOSIS  °The diagnosis is made by 3 separate blood pressure measurements. They are taken at least 1 week apart from each other. If there is organ damage from hypertension, the diagnosis may be made without repeat measurements. °Hypertension is usually identified by having blood pressure readings: °· Above 140/90 mmHg measured in both arms, at 3 separate times, over a couple weeks. °· Over 130/80 mmHg should be considered a risk factor and may require treatment in patients with diabetes. °Blood pressure readings over 120/80 mmHg are called "pre-hypertension" even in non-diabetic patients. °To get a true blood pressure measurement, use the following guidelines. Be aware of the factors that can alter blood pressure readings. °· Take measurements at least 1 hour after caffeine. °· Take measurements 30 minutes after smoking and without any stress. This is another reason to quit smoking  it raises your blood pressure. °· Use a proper cuff size. Ask your caregiver if you are not sure about your cuff size. °· Most home blood pressure cuffs are automatic. They will measure systolic and diastolic pressures. The systolic pressure is the pressure reading at the start of sounds. Diastolic pressure is the pressure at which the sounds disappear. If you are elderly, measure pressures in multiple postures. Try sitting, lying or standing. °· Sit at rest for a minimum of 5 minutes before taking measurements. °· You should not be on any medications like decongestants. These are found in many cold medications. °· Record your blood pressure readings and review   them with your caregiver. °If you have hypertension: °· Your caregiver may do tests to be sure you do not have  secondary hypertension (see "causes" above). °· Your caregiver may also look for signs of metabolic syndrome. This is also called Syndrome X or Insulin Resistance Syndrome. You may have this syndrome if you have type 2 diabetes, abdominal obesity, and abnormal blood lipids in addition to hypertension. °· Your caregiver will take your medical and family history and perform a physical exam. °· Diagnostic tests may include blood tests (for glucose, cholesterol, potassium, and kidney function), a urinalysis, or an EKG. Other tests may also be necessary depending on your condition. °PREVENTION  °There are important lifestyle issues that you can adopt to reduce your chance of developing hypertension: °· Maintain a normal weight. °· Limit the amount of salt (sodium) in your diet. °· Exercise often. °· Limit alcohol intake. °· Get enough potassium in your diet. Discuss specific advice with your caregiver. °· Follow a DASH diet (dietary approaches to stop hypertension). This diet is rich in fruits, vegetables, and low-fat dairy products, and avoids certain fats. °PROGNOSIS  °Essential hypertension cannot be cured. Lifestyle changes and medical treatment can lower blood pressure and reduce complications. The prognosis of secondary hypertension depends on the underlying cause. Many people whose hypertension is controlled with medicine or lifestyle changes can live a normal, healthy life.  °RISKS AND COMPLICATIONS  °While high blood pressure alone is not an illness, it often requires treatment due to its short- and long-term effects on many organs. Hypertension increases your risk for: °· CVAs or strokes (cerebrovascular accident). °· Heart failure due to chronically high blood pressure (hypertensive cardiomyopathy). °· Heart attack (myocardial infarction). °· Damage to the retina (hypertensive retinopathy). °· Kidney failure (hypertensive nephropathy). °Your caregiver can explain list items above that apply to you. Treatment  of hypertension can significantly reduce the risk of complications. °TREATMENT  °· For overweight patients, weight loss and regular exercise are recommended. Physical fitness lowers blood pressure. °· Mild hypertension is usually treated with diet and exercise. A diet rich in fruits and vegetables, fat-free dairy products, and foods low in fat and salt (sodium) can help lower blood pressure. Decreasing salt intake decreases blood pressure in a 1/3 of people. °· Stop smoking if you are a smoker. °The steps above are highly effective in reducing blood pressure. While these actions are easy to suggest, they are difficult to achieve. Most patients with moderate or severe hypertension end up requiring medications to bring their blood pressure down to a normal level. There are several classes of medications for treatment. Blood pressure pills (antihypertensives) will lower blood pressure by their different actions. Lowering the blood pressure by 10 mmHg may decrease the risk of complications by as much as 25%. °The goal of treatment is effective blood pressure control. This will reduce your risk for complications. Your caregiver will help you determine the best treatment for you according to your lifestyle. What is excellent treatment for one person, may not be for you. °HOME CARE INSTRUCTIONS  °· Do not smoke. °· Follow the lifestyle changes outlined in the "Prevention" section. °· If you are on medications, follow the directions carefully. Blood pressure medications must be taken as prescribed. Skipping doses reduces their benefit. It also puts you at risk for problems. °· Follow up with your caregiver, as directed. °· If you are asked to monitor your blood pressure at home, follow the guidelines in the "Diagnosis" section above. °SEEK MEDICAL CARE   IF:  °· You think you are having medication side effects. °· You have recurrent headaches or lightheadedness. °· You have swelling in your ankles. °· You have trouble with  your vision. °SEEK IMMEDIATE MEDICAL CARE IF:  °· You have sudden onset of chest pain or pressure, difficulty breathing, or other symptoms of a heart attack. °· You have a severe headache. °· You have symptoms of a stroke (such as sudden weakness, difficulty speaking, difficulty walking). °MAKE SURE YOU:  °· Understand these instructions. °· Will watch your condition. °· Will get help right away if you are not doing well or get worse. °Document Released: 05/13/2005 Document Revised: 08/05/2011 Document Reviewed: 12/11/2006 °[employer]® Patient Information ©2014 [employer]. ° °

## 2013-08-01 NOTE — ED Provider Notes (Addendum)
CSN: [id number]     Arrival date & time 08/01/13  1030 History   First MD Initiated Contact with Patient 08/01/13 1100     No chief complaint on file.    (Consider location/radiation/quality/duration/timing/severity/associated sxs/prior Treatment) Patient is a 58 y.o. male presenting with nosebleeds. The history is provided by the patient.  Epistaxis Location:  Bilateral Severity:  Moderate Duration:  1 hour Timing:  Constant Progression:  Resolved Chronicity:  New Context: hypertension   Context: not anticoagulants, not aspirin use, not bleeding disorder, not foreign body, not home oxygen and not thrombocytopenia   Relieved by:  Vasoconstrictors Worsened by:  Nothing tried Ineffective treatments:  Applying pressure Associated symptoms: no congestion, no cough, no dizziness, no facial pain, no fever, no sinus pain and no syncope   Risk factors: alcohol use   Risk factors: no frequent nosebleeds, no head and neck surgery, no head and neck tumor, no recent chemotherapy and no recent nasal surgery   Risk factors comment:  No anticoagulation   Past Medical History  Diagnosis Date  . CVA (cerebral vascular accident)   . Hypertension   . CHF (congestive heart failure)   . AICD (automatic cardioverter/defibrillator) -medtronic     Date of implant 2007  . Seizure   . Nonischemic cardiomyopathy     Catheterization 2005 normal coronary arteries; EF 15-20%;  2011 EF normal   Past Surgical History  Procedure Laterality Date  . Cardiac defibrillator placement     No family history on file. History  Substance Use Topics  . Smoking status: Current Every Day Smoker -- 1.00 packs/day for 39 years  . Smokeless tobacco: Not on file  . Alcohol Use: Yes     Comment: moonshine, quart per day 39 years    Review of Systems  Constitutional: Negative for fever.  HENT: Positive for nosebleeds. Negative for congestion.   Respiratory: Negative for cough.   Cardiovascular: Negative for  syncope.  Neurological: Negative for dizziness.  All other systems reviewed and are negative.      Allergies  Codeine; Penicillins; and Prednisone  Home Medications   Current Outpatient Rx  Name  Route  Sig  Dispense  Refill  . aspirin 325 MG tablet   Oral   Take 325 mg by mouth daily.         Marland Kitchen amLODipine (NORVASC) 5 MG tablet   Oral   Take 1 tablet (5 mg total) by mouth daily.   30 tablet   0   . atorvastatin (LIPITOR) 10 MG tablet   Oral   Take 2 tablets (20 mg total) by mouth daily.   60 tablet   0   . carvedilol (COREG) 25 MG tablet   Oral   Take 1 tablet (25 mg total) by mouth 2 (two) times daily with a meal.   60 tablet   0   . clopidogrel (PLAVIX) 75 MG tablet   Oral   Take 1 tablet (75 mg total) by mouth daily with breakfast.   30 tablet   0   . ramipril (ALTACE) 10 MG capsule   Oral   Take 1 capsule (10 mg total) by mouth daily.   30 capsule   0    BP 161/120  Pulse 112  Temp(Src) 98.5 F (36.9 C) (Oral)  Resp 20  SpO2 96% Physical Exam  Nursing note and vitals reviewed. Constitutional: He is oriented to person, place, and time. He appears well-developed and well-nourished. No distress.  HENT:  Head:  Normocephalic and atraumatic.  Nose: Epistaxis is observed.  Mouth/Throat: Oropharynx is clear and moist.  Dried blood in bilateral nares but no clot present and no active bleeding.  No blood in posterior pharynx  Eyes: Conjunctivae and EOM are normal. Pupils are equal, round, and reactive to light.  Neck: Normal range of motion. Neck supple.  Cardiovascular: Regular rhythm and intact distal pulses.  Tachycardia present.   No murmur heard. Pulmonary/Chest: Effort normal and breath sounds normal. No respiratory distress. He has no wheezes. He has no rales.  Abdominal: Soft. He exhibits no distension. There is no tenderness. There is no rebound and no guarding.  Musculoskeletal: Normal range of motion. He exhibits no edema and no tenderness.   Neurological: He is alert and oriented to person, place, and time.  Skin: Skin is warm and dry. No rash noted. No erythema.  Psychiatric: He has a normal mood and affect. His behavior is normal.    ED Course  Procedures (including critical care time) Labs Review Labs Reviewed  URINALYSIS, ROUTINE W REFLEX MICROSCOPIC - Abnormal; Notable for the following:    Hgb urine dipstick TRACE (*)    Protein, ur 100 (*)    All other components within normal limits  URINE MICROSCOPIC-ADD ON   Imaging Review No results found.   EKG Interpretation None      MDM   Final diagnoses:  Epistaxis  Hypertension    Patient presents with epistaxis that started approximately one hour ago and now is resolved. He states he was coming out of bilateral nares. Currently he has dried blood in both nares but no sign of blood clot or a localized area of bleed. He had no blood in his posterior pharynx and is otherwise in no acute distress. Patient was given aspirin by EMS prior to arrival. He states he had one nosebleed approximately one year ago but does not have frequent nosebleeds. He takes no anticoagulation at this time including the Plavix or aspirin. Patient states last time he took those medications was one year ago. Patient has been seen in the emergency room twice in the last 2 days her various complaints. Initially with chest pain that was worked up and found to be noncardiac the second with hematuria last night and patient eloped before getting a urine sample back. He states since that time he's urinated without blood in his urine. Patient has a history of chronic noncompliance and has not taken any medications for approximately one year. The physician last night was going to give him a prescription for blood pressure medicine however patient eloped before getting prescriptions. When discussing with him if he would be interested in taking blood pressure medicine to improve his blood pressure he states  that he would be willing to do that. He denies any chest pain, abdominal pain nausea or vomiting at this time. Epistaxis is currently resolved and unclear if it was posterior anterior. However her blood pressure may be a contributing factor. Patient given a dose of carvedilol and amlodipine.  We'll monitor to ensure that nosebleeds not recur.  12:56 PM Pt had 1 episode of vomiting here with old blood from nose bleed but nosebleed did not recur.  Will given odt zofran.  Pt repeat BP was improved after meds.  Will d/c home with home bp meds and UA today clear with no RBC's present.  BP after meds improved to 140/88.    Gwyneth SproutWhitney Teigen Parslow, MD 08/01/13 1257  Gwyneth SproutWhitney Abel Hageman, MD 08/01/13 1342

## 2013-08-01 NOTE — ED Notes (Signed)
Pt. In NAD,epitaxis resolved, eating sandwich per MD permission.

## 2013-08-02 ENCOUNTER — Encounter: Payer: Self-pay | Admitting: Internal Medicine

## 2013-08-02 LAB — I-STAT TROPONIN, ED: Troponin i, poc: 0.03 ng/mL (ref 0.00–0.08)

## 2013-09-17 ENCOUNTER — Encounter: Payer: Self-pay | Admitting: Internal Medicine

## 2013-09-17 ENCOUNTER — Telehealth: Payer: Self-pay | Admitting: Internal Medicine

## 2013-09-17 NOTE — Telephone Encounter (Signed)
called pt's home number, not valid, called wife's number, not valid, called mother's number talked to sister and gave her our number to have pt call our office back to set up past due pacer check with Graciela Husbands, no showed last five appts/mt

## 2013-10-25 ENCOUNTER — Encounter: Payer: Self-pay | Admitting: Cardiology

## 2013-10-26 ENCOUNTER — Telehealth: Payer: Self-pay | Admitting: Internal Medicine

## 2013-10-26 NOTE — Telephone Encounter (Signed)
10-25-13 sent past due device check letter certified/mt ° °

## 2013-10-27 ENCOUNTER — Emergency Department (HOSPITAL_COMMUNITY): Payer: Medicare Other

## 2013-10-27 ENCOUNTER — Inpatient Hospital Stay (HOSPITAL_COMMUNITY)
Admission: EM | Admit: 2013-10-27 | Discharge: 2013-10-30 | DRG: 312 | Disposition: A | Discharge status: Home / Self Care | Payer: Medicare Other | Attending: Internal Medicine | Admitting: Internal Medicine

## 2013-10-27 ENCOUNTER — Encounter (HOSPITAL_COMMUNITY): Payer: Self-pay | Admitting: Emergency Medicine

## 2013-10-27 DIAGNOSIS — F1010 Alcohol abuse, uncomplicated: Secondary | ICD-10-CM

## 2013-10-27 DIAGNOSIS — I10 Essential (primary) hypertension: Secondary | ICD-10-CM

## 2013-10-27 DIAGNOSIS — I959 Hypotension, unspecified: Secondary | ICD-10-CM

## 2013-10-27 DIAGNOSIS — I428 Other cardiomyopathies: Secondary | ICD-10-CM

## 2013-10-27 DIAGNOSIS — D649 Anemia, unspecified: Secondary | ICD-10-CM | POA: Diagnosis present

## 2013-10-27 DIAGNOSIS — Z8673 Personal history of transient ischemic attack (TIA), and cerebral infarction without residual deficits: Secondary | ICD-10-CM

## 2013-10-27 DIAGNOSIS — E876 Hypokalemia: Secondary | ICD-10-CM

## 2013-10-27 DIAGNOSIS — I509 Heart failure, unspecified: Secondary | ICD-10-CM | POA: Diagnosis present

## 2013-10-27 DIAGNOSIS — J4489 Other specified chronic obstructive pulmonary disease: Secondary | ICD-10-CM | POA: Diagnosis present

## 2013-10-27 DIAGNOSIS — Z7902 Long term (current) use of antithrombotics/antiplatelets: Secondary | ICD-10-CM

## 2013-10-27 DIAGNOSIS — F17200 Nicotine dependence, unspecified, uncomplicated: Secondary | ICD-10-CM | POA: Diagnosis present

## 2013-10-27 DIAGNOSIS — Z720 Tobacco use: Secondary | ICD-10-CM

## 2013-10-27 DIAGNOSIS — I5020 Unspecified systolic (congestive) heart failure: Secondary | ICD-10-CM

## 2013-10-27 DIAGNOSIS — Z7982 Long term (current) use of aspirin: Secondary | ICD-10-CM

## 2013-10-27 DIAGNOSIS — Z95810 Presence of automatic (implantable) cardiac defibrillator: Secondary | ICD-10-CM

## 2013-10-27 DIAGNOSIS — E860 Dehydration: Secondary | ICD-10-CM | POA: Diagnosis present

## 2013-10-27 DIAGNOSIS — Z79899 Other long term (current) drug therapy: Secondary | ICD-10-CM

## 2013-10-27 DIAGNOSIS — G40909 Epilepsy, unspecified, not intractable, without status epilepticus: Secondary | ICD-10-CM

## 2013-10-27 DIAGNOSIS — J449 Chronic obstructive pulmonary disease, unspecified: Secondary | ICD-10-CM | POA: Diagnosis present

## 2013-10-27 DIAGNOSIS — R55 Syncope and collapse: Principal | ICD-10-CM

## 2013-10-27 DIAGNOSIS — I498 Other specified cardiac arrhythmias: Secondary | ICD-10-CM | POA: Diagnosis not present

## 2013-10-27 DIAGNOSIS — T82198A Other mechanical complication of other cardiac electronic device, initial encounter: Secondary | ICD-10-CM

## 2013-10-27 DIAGNOSIS — R790 Abnormal level of blood mineral: Secondary | ICD-10-CM

## 2013-10-27 HISTORY — DX: Alcohol abuse, uncomplicated: F10.10

## 2013-10-27 LAB — CBC WITH DIFFERENTIAL/PLATELET
Basophils Absolute: 0 10*3/uL (ref 0.0–0.1)
Basophils Relative: 0 % (ref 0–1)
Eosinophils Absolute: 0.2 10*3/uL (ref 0.0–0.7)
Eosinophils Relative: 3 % (ref 0–5)
HCT: 32.6 % — ABNORMAL LOW (ref 39.0–52.0)
Hemoglobin: 11.2 g/dL — ABNORMAL LOW (ref 13.0–17.0)
Lymphocytes Relative: 39 % (ref 12–46)
Lymphs Abs: 1.9 10*3/uL (ref 0.7–4.0)
MCH: 32.2 pg (ref 26.0–34.0)
MCHC: 34.4 g/dL (ref 30.0–36.0)
MCV: 93.7 fL (ref 78.0–100.0)
Monocytes Absolute: 0.5 10*3/uL (ref 0.1–1.0)
Monocytes Relative: 10 % (ref 3–12)
Neutro Abs: 2.4 10*3/uL (ref 1.7–7.7)
Neutrophils Relative %: 48 % (ref 43–77)
Platelets: 175 10*3/uL (ref 150–400)
RBC: 3.48 MIL/uL — ABNORMAL LOW (ref 4.22–5.81)
RDW: 15.1 % (ref 11.5–15.5)
WBC: 5 10*3/uL (ref 4.0–10.5)

## 2013-10-27 LAB — COMPREHENSIVE METABOLIC PANEL
ALT: 51 U/L (ref 0–53)
AST: 61 U/L — ABNORMAL HIGH (ref 0–37)
Albumin: 3.4 g/dL — ABNORMAL LOW (ref 3.5–5.2)
Alkaline Phosphatase: 110 U/L (ref 39–117)
BUN: 17 mg/dL (ref 6–23)
CO2: 26 mEq/L (ref 19–32)
Calcium: 9.2 mg/dL (ref 8.4–10.5)
Chloride: 105 mEq/L (ref 96–112)
Creatinine, Ser: 1.51 mg/dL — ABNORMAL HIGH (ref 0.50–1.35)
GFR calc Af Amer: 56 mL/min — ABNORMAL LOW (ref >90)
GFR calc non Af Amer: 49 mL/min — ABNORMAL LOW (ref >90)
Glucose, Bld: 98 mg/dL (ref 70–99)
Potassium: 3.4 mEq/L — ABNORMAL LOW (ref 3.7–5.3)
Sodium: 145 mEq/L (ref 137–147)
Total Bilirubin: 0.3 mg/dL (ref 0.3–1.2)
Total Protein: 7 g/dL (ref 6.0–8.3)

## 2013-10-27 LAB — I-STAT TROPONIN, ED: Troponin i, poc: 0.08 ng/mL (ref 0.00–0.08)

## 2013-10-27 LAB — PROTIME-INR
INR: 1.03 (ref 0.00–1.49)
Prothrombin Time: 13.3 seconds (ref 11.6–15.2)

## 2013-10-27 LAB — I-STAT CG4 LACTIC ACID, ED: Lactic Acid, Venous: 2.82 mmol/L — ABNORMAL HIGH (ref 0.5–2.2)

## 2013-10-27 LAB — ETHANOL: Alcohol, Ethyl (B): 245 mg/dL — ABNORMAL HIGH (ref 0–11)

## 2013-10-27 LAB — PRO B NATRIURETIC PEPTIDE: Pro B Natriuretic peptide (BNP): 692.6 pg/mL — ABNORMAL HIGH (ref 0–125)

## 2013-10-27 LAB — CBG MONITORING, ED: Glucose-Capillary: 105 mg/dL — ABNORMAL HIGH (ref 70–99)

## 2013-10-27 MED ORDER — SODIUM CHLORIDE 0.9 % IV BOLUS (SEPSIS)
1000.0000 mL | Freq: Once | INTRAVENOUS | Status: AC
  Administered 2013-10-27: 1000 mL via INTRAVENOUS

## 2013-10-27 MED ORDER — ONDANSETRON HCL 4 MG/2ML IJ SOLN
4.0000 mg | Freq: Once | INTRAMUSCULAR | Status: AC
  Administered 2013-10-27: 4 mg via INTRAVENOUS
  Filled 2013-10-27: qty 2

## 2013-10-27 NOTE — ED Notes (Signed)
Dr. Patel at bedside 

## 2013-10-27 NOTE — ED Notes (Signed)
Per EMS: pt coming from home with c/o weakness, vomiting, syncopal episode. Pt was having a bowel movement at the time of complaint. Initial BP 60 palpated, diaphoretic on scene, last BP 104/74, HR 80. Pt denies chest pain, shortness of breath, n/v. Pt admits to drinking moonshine tonight. Pt A&Ox4, respirations equal and unlabored, skin warm and dry

## 2013-10-27 NOTE — ED Provider Notes (Signed)
CSN: [id number]     Arrival date & time 10/27/13  2017 History   First MD Initiated Contact with Patient 10/27/13 2018     Chief Complaint  Patient presents with  . Near Syncope  . Weakness      In addition to what is below. Patient is a 58 year old male with past medical history of prior CVA, seizures, nonischemic cardiomyopathy, ICD in place, and alcohol abuse who presents after syncopal episode. Patient reports that he was on the commode, felt nauseated, vomited, and the next thing he remembers is waking up on the floor with significant other present. Significant other reports the patient was unresponsive for approximately 5 minutes. She reports no seizure-like activity. Patient currently has complaints of neck and shoulder pain but no chest pain, shortness of breath, abdominal pain, or headache. Patient has been drinking alcohol today and he reports one drink of moonshine. EMS reported that upon arrival blood pressure was 60 over palp, glucose greater than 100, and initially confused.  They set patient up and began moving onto a stretcher and without any intervention his blood pressure increased.    (Consider location/radiation/quality/duration/timing/severity/associated sxs/prior Treatment) Patient is a 58 y.o. male presenting with syncope. The history is provided by the patient and medical records. No language interpreter was used.  Loss of Consciousness Episode history:  Single Most recent episode:  Today Timing:  Intermittent Progression:  Resolved Chronicity:  New Context: bowel movement   Witnessed: yes   Relieved by:  Bed rest Worsened by:  Nothing tried Ineffective treatments:  None tried Associated symptoms: no anxiety, no chest pain, no diaphoresis, no difficulty breathing, no focal weakness, no headaches, no rectal bleeding and no seizures   Risk factors: seizures     Past Medical History  Diagnosis Date  . CVA (cerebral vascular accident)   . Hypertension   . CHF  (congestive heart failure)   . AICD (automatic cardioverter/defibrillator) -medtronic     Date of implant 2007  . Seizure   . Nonischemic cardiomyopathy     Catheterization 2005 normal coronary arteries; EF 15-20%;  2011 EF normal   Past Surgical History  Procedure Laterality Date  . Cardiac defibrillator placement     No family history on file. History  Substance Use Topics  . Smoking status: Current Every Day Smoker -- 1.00 packs/day for 39 years  . Smokeless tobacco: Not on file  . Alcohol Use: Yes     Comment: moonshine, quart per day 39 years    Review of Systems  Constitutional: Negative for diaphoresis.  Cardiovascular: Positive for syncope. Negative for chest pain.  Neurological: Negative for focal weakness, seizures and headaches.  All other systems reviewed and are negative.     Allergies  Codeine; Penicillins; and Prednisone  Home Medications   Prior to Admission medications   Medication Sig Start Date End Date Taking? Authorizing Provider  amLODipine (NORVASC) 5 MG tablet Take 1 tablet (5 mg total) by mouth daily. 08/01/13   Gwyneth Sprout, MD  aspirin 325 MG tablet Take 325 mg by mouth daily.    Historical Provider, MD  atorvastatin (LIPITOR) 10 MG tablet Take 2 tablets (20 mg total) by mouth daily. 08/01/13   Gwyneth Sprout, MD  carvedilol (COREG) 25 MG tablet Take 1 tablet (25 mg total) by mouth 2 (two) times daily with a meal. 08/01/13   Gwyneth Sprout, MD  clopidogrel (PLAVIX) 75 MG tablet Take 1 tablet (75 mg total) by mouth daily with breakfast. 07/31/13  Rolan Bucco, MD  ramipril (ALTACE) 10 MG capsule Take 1 capsule (10 mg total) by mouth daily. 08/01/13   Gwyneth Sprout, MD   BP 107/66  Pulse 59  Temp(Src) 96.8 F (36 C) (Rectal)  Resp 14  SpO2 96% Physical Exam  Nursing note and vitals reviewed. Constitutional: He is oriented to person, place, and time. He appears well-developed and well-nourished. No distress.  HENT:  Head: Normocephalic  and atraumatic.  Right Ear: External ear normal.  Left Ear: External ear normal.  Mouth/Throat: Oropharynx is clear and moist.  Eyes: Conjunctivae are normal. Pupils are equal, round, and reactive to light.  Neck: Normal range of motion. Neck supple.  Cardiovascular: Normal rate and regular rhythm.   Pulmonary/Chest: Effort normal and breath sounds normal. No respiratory distress.  Abdominal: Soft. Bowel sounds are normal.  Musculoskeletal: Normal range of motion. He exhibits no edema and no tenderness.  Neurological: He is alert and oriented to person, place, and time.  Skin: Skin is warm and dry.  Psychiatric: He has a normal mood and affect.    ED Course  Procedures (including critical care time) Labs Review Labs Reviewed  CBC WITH DIFFERENTIAL - Abnormal; Notable for the following:    RBC 3.48 (*)    Hemoglobin 11.2 (*)    HCT 32.6 (*)    All other components within normal limits  COMPREHENSIVE METABOLIC PANEL - Abnormal; Notable for the following:    Potassium 3.4 (*)    Creatinine, Ser 1.51 (*)    Albumin 3.4 (*)    AST 61 (*)    GFR calc non Af Amer 49 (*)    GFR calc Af Amer 56 (*)    All other components within normal limits  PRO B NATRIURETIC PEPTIDE - Abnormal; Notable for the following:    Pro B Natriuretic peptide (BNP) 692.6 (*)    All other components within normal limits  ETHANOL - Abnormal; Notable for the following:    Alcohol, Ethyl (B) 245 (*)    All other components within normal limits  I-STAT CG4 LACTIC ACID, ED - Abnormal; Notable for the following:    Lactic Acid, Venous 2.82 (*)    All other components within normal limits  CBG MONITORING, ED - Abnormal; Notable for the following:    Glucose-Capillary 105 (*)    All other components within normal limits  PROTIME-INR  Rosezena Sensor, ED    Imaging Review Dg Chest Port 1 View  10/27/2013   CLINICAL DATA:  Syncope  EXAM: PORTABLE CHEST - 1 VIEW  COMPARISON:  Chest CT and chest radiograph,  10/02/2012.  FINDINGS: Cardiac silhouette is normal in size. Normal mediastinal and hilar contours. Clear lungs. No pleural effusion or pneumothorax. Right anterior chest wall AICD is stable in well positioned. Bony thorax is grossly intact.  IMPRESSION: No acute cardiopulmonary disease.   Electronically Signed   By: Amie Portland M.D.   On: 10/27/2013 20:50     EKG Interpretation None        Date: 10/27/2013  Rate: 67  Rhythm: normal sinus rhythm  QRS Axis: normal  Intervals: normal  ST/T Wave abnormalities: t wave inv in lateral leads  Conduction Disutrbances:ventricular bigeminy  Narrative Interpretation:   Old EKG Reviewed: t wave inversions have been seen in prior leads; bigeminy has not been seen     MDM   Final diagnoses:  None    Patient presents to the emergency department after syncopal episode. With EMS he was initially  found to be hypotensive. Upon arrival here his vital signs remarkable for a blood pressure of 87/65. On exam patient with no focal neurological deficits, no signs of trauma, and otherwise as above. Given patient's significant cardiac history it was felt that workup with EKG, chest x-ray, and labs were warranted. He was given IV fluids. Review of results shows the patient has an EKG with no signs of ischemia, unremarkable chest x-ray, and labs remarkable for mild lactic acidosis. Patient was given IV fluids and his blood pressures improved. Due to history of heart failure, hypotension, and syncope it was felt that admission was appropriate. Hospitalist consulted and patient to be admitted to the step down unit. Patient's ICD was interrogated and per report had bouts of tachycardia with demand pacing but during reported event no abnormalities or defibrillations were reported.    Johnney Ouerek Shakema Surita, MD 10/27/13 [phone]

## 2013-10-28 ENCOUNTER — Encounter (HOSPITAL_COMMUNITY): Payer: Self-pay | Admitting: *Deleted

## 2013-10-28 DIAGNOSIS — I10 Essential (primary) hypertension: Secondary | ICD-10-CM

## 2013-10-28 DIAGNOSIS — I959 Hypotension, unspecified: Secondary | ICD-10-CM

## 2013-10-28 DIAGNOSIS — R55 Syncope and collapse: Principal | ICD-10-CM

## 2013-10-28 DIAGNOSIS — F1010 Alcohol abuse, uncomplicated: Secondary | ICD-10-CM

## 2013-10-28 LAB — COMPREHENSIVE METABOLIC PANEL
ALT: 44 U/L (ref 0–53)
AST: 47 U/L — ABNORMAL HIGH (ref 0–37)
Albumin: 3.1 g/dL — ABNORMAL LOW (ref 3.5–5.2)
Alkaline Phosphatase: 103 U/L (ref 39–117)
BUN: 16 mg/dL (ref 6–23)
CO2: 23 mEq/L (ref 19–32)
Calcium: 9 mg/dL (ref 8.4–10.5)
Chloride: 108 mEq/L (ref 96–112)
Creatinine, Ser: 1.28 mg/dL (ref 0.50–1.35)
GFR calc Af Amer: 69 mL/min — ABNORMAL LOW (ref >90)
GFR calc non Af Amer: 59 mL/min — ABNORMAL LOW (ref >90)
Glucose, Bld: 108 mg/dL — ABNORMAL HIGH (ref 70–99)
Potassium: 3.7 mEq/L (ref 3.7–5.3)
Sodium: 146 mEq/L (ref 137–147)
Total Bilirubin: 0.3 mg/dL (ref 0.3–1.2)
Total Protein: 6.3 g/dL (ref 6.0–8.3)

## 2013-10-28 LAB — CBC
HCT: 30.6 % — ABNORMAL LOW (ref 39.0–52.0)
Hemoglobin: 10.4 g/dL — ABNORMAL LOW (ref 13.0–17.0)
MCH: 32 pg (ref 26.0–34.0)
MCHC: 34 g/dL (ref 30.0–36.0)
MCV: 94.2 fL (ref 78.0–100.0)
Platelets: 149 10*3/uL — ABNORMAL LOW (ref 150–400)
RBC: 3.25 MIL/uL — ABNORMAL LOW (ref 4.22–5.81)
RDW: 15.4 % (ref 11.5–15.5)
WBC: 4.7 10*3/uL (ref 4.0–10.5)

## 2013-10-28 LAB — TROPONIN I
Troponin I: <0.3 ng/mL (ref <0.30)
Troponin I: <0.3 ng/mL (ref <0.30)
Troponin I: <0.3 ng/mL

## 2013-10-28 LAB — PROTIME-INR
INR: 1.08 (ref 0.00–1.49)
Prothrombin Time: 13.8 s (ref 11.6–15.2)

## 2013-10-28 LAB — MAGNESIUM: Magnesium: 1.3 mg/dL — ABNORMAL LOW (ref 1.5–2.5)

## 2013-10-28 MED ORDER — HEPARIN SODIUM (PORCINE) 5000 UNIT/ML IJ SOLN
5000.0000 [IU] | Freq: Three times a day (TID) | INTRAMUSCULAR | Status: DC
  Administered 2013-10-28 – 2013-10-30 (×8): 5000 [IU] via SUBCUTANEOUS
  Filled 2013-10-28 (×11): qty 1

## 2013-10-28 MED ORDER — THIAMINE HCL 100 MG/ML IJ SOLN
100.0000 mg | Freq: Every day | INTRAMUSCULAR | Status: DC
  Filled 2013-10-28 (×2): qty 1

## 2013-10-28 MED ORDER — CARVEDILOL 25 MG PO TABS
25.0000 mg | ORAL_TABLET | Freq: Two times a day (BID) | ORAL | Status: DC
Start: 2013-10-28 — End: 2013-10-30
  Administered 2013-10-28 – 2013-10-30 (×4): 25 mg via ORAL
  Filled 2013-10-28 (×6): qty 1

## 2013-10-28 MED ORDER — ACETAMINOPHEN 325 MG PO TABS
650.0000 mg | ORAL_TABLET | Freq: Four times a day (QID) | ORAL | Status: DC | PRN
  Administered 2013-10-28 – 2013-10-30 (×3): 650 mg via ORAL
  Filled 2013-10-28 (×3): qty 2

## 2013-10-28 MED ORDER — SODIUM CHLORIDE 0.9 % IJ SOLN
3.0000 mL | Freq: Two times a day (BID) | INTRAMUSCULAR | Status: DC
  Administered 2013-10-28 – 2013-10-29 (×4): 3 mL via INTRAVENOUS

## 2013-10-28 MED ORDER — ADULT MULTIVITAMIN W/MINERALS CH
1.0000 | ORAL_TABLET | Freq: Every day | ORAL | Status: DC
  Administered 2013-10-28 – 2013-10-30 (×3): 1 via ORAL
  Filled 2013-10-28 (×3): qty 1

## 2013-10-28 MED ORDER — CLOPIDOGREL BISULFATE 75 MG PO TABS
75.0000 mg | ORAL_TABLET | Freq: Every day | ORAL | Status: DC
  Administered 2013-10-29 – 2013-10-30 (×2): 75 mg via ORAL
  Filled 2013-10-28 (×4): qty 1

## 2013-10-28 MED ORDER — VITAMIN B-1 100 MG PO TABS
100.0000 mg | ORAL_TABLET | Freq: Every day | ORAL | Status: DC
  Administered 2013-10-28 – 2013-10-30 (×3): 100 mg via ORAL
  Filled 2013-10-28 (×3): qty 1

## 2013-10-28 MED ORDER — ONDANSETRON HCL 4 MG PO TABS: 4.0000 mg | ORAL_TABLET | Freq: Four times a day (QID) | ORAL | Status: DC | PRN

## 2013-10-28 MED ORDER — ATORVASTATIN CALCIUM 20 MG PO TABS
20.0000 mg | ORAL_TABLET | Freq: Every day | ORAL | Status: DC
  Administered 2013-10-28 – 2013-10-29 (×2): 20 mg via ORAL
  Filled 2013-10-28 (×3): qty 1

## 2013-10-28 MED ORDER — LORAZEPAM 1 MG PO TABS
1.0000 mg | ORAL_TABLET | Freq: Four times a day (QID) | ORAL | Status: DC | PRN
  Administered 2013-10-28: 1 mg via ORAL
  Filled 2013-10-28: qty 1

## 2013-10-28 MED ORDER — SODIUM CHLORIDE 0.9 % IV SOLN
INTRAVENOUS | Status: DC
  Administered 2013-10-28: 02:00:00 via INTRAVENOUS

## 2013-10-28 MED ORDER — FOLIC ACID 1 MG PO TABS
1.0000 mg | ORAL_TABLET | Freq: Every day | ORAL | Status: DC
  Administered 2013-10-28 – 2013-10-30 (×3): 1 mg via ORAL
  Filled 2013-10-28 (×3): qty 1

## 2013-10-28 MED ORDER — LORAZEPAM 2 MG/ML IJ SOLN: 1.0000 mg | Freq: Four times a day (QID) | INTRAMUSCULAR | Status: DC | PRN

## 2013-10-28 MED ORDER — ACETAMINOPHEN 650 MG RE SUPP: 650.0000 mg | Freq: Four times a day (QID) | RECTAL | Status: DC | PRN

## 2013-10-28 MED ORDER — AMLODIPINE BESYLATE 5 MG PO TABS
5.0000 mg | ORAL_TABLET | Freq: Every day | ORAL | Status: DC
  Administered 2013-10-28 – 2013-10-30 (×3): 5 mg via ORAL
  Filled 2013-10-28 (×3): qty 1

## 2013-10-28 MED ORDER — ASPIRIN 325 MG PO TABS
325.0000 mg | ORAL_TABLET | Freq: Every day | ORAL | Status: DC
  Administered 2013-10-28 – 2013-10-30 (×3): 325 mg via ORAL
  Filled 2013-10-28 (×3): qty 1

## 2013-10-28 MED ORDER — PANTOPRAZOLE SODIUM 40 MG PO TBEC
40.0000 mg | DELAYED_RELEASE_TABLET | Freq: Every day | ORAL | Status: DC
  Administered 2013-10-28 – 2013-10-30 (×3): 40 mg via ORAL
  Filled 2013-10-28 (×3): qty 1

## 2013-10-28 MED ORDER — PANTOPRAZOLE SODIUM 40 MG IV SOLR
40.0000 mg | Freq: Once | INTRAVENOUS | Status: AC
  Administered 2013-10-28: 40 mg via INTRAVENOUS
  Filled 2013-10-28: qty 40

## 2013-10-28 MED ORDER — ONDANSETRON HCL 4 MG/2ML IJ SOLN: 4.0000 mg | Freq: Four times a day (QID) | INTRAMUSCULAR | Status: DC | PRN

## 2013-10-28 NOTE — Progress Notes (Signed)
Patient's blood pressure 157/121. Pt given 1 mg of ativan prn for hypertension and anxiety.    Quaniyah Bugh,MSN,RN

## 2013-10-28 NOTE — Progress Notes (Signed)
PROGRESS NOTE  [name] Phillips ZOX:[id number] DOB: [birth year] DOA: 10/27/2013 PCP: Pola Corn, MD  Assessment/Plan: Syncope  -hypotensive initially but his blood pressure has significantly improved with IV hydration.  -EKG and initial blood work does not show any acute abnormality Chest x-ray is clear. AICD evaluation was not showing any acute abnormality as well.  -echocardiogram.  He has a history of seizures in the setting of chronic alcohol abuse and not on any AED.  At present this episode does not appear to be seizure and we will monitor him with serial neuro checks.   Alcohol abuse  Possible cause of patient's current presentation. Patient denies drinking alcohol on a daily basis.  CIWA  Hypertension.  At present all his blood pressure medications are on hold which should be restarted once his blood pressure is normalized.   Code Status: full Family Communication: patient/wife- not interested in quiting Disposition Plan:    Consultants:  none  Procedures:      HPI/Subjective: Wanting to go home Not interested in cutting back or quitting Says water gives him diarrhea  Objective: Filed Vitals:   10/28/13 0602  BP: 112/74  Pulse: 95  Temp: 98.5 F (36.9 C)  Resp: 18    Intake/Output Summary (Last 24 hours) at 10/28/13 1104 Last data filed at 10/28/13 0958  Gross per 24 hour  Intake 296.67 ml  Output    700 ml  Net -403.33 ml   There were no vitals filed for this visit.  Exam:   General:  A+Ox3, NAD  Cardiovascular: rrr  Respiratory: clear  Abdomen: +BS, soft  Musculoskeletal: moves all4 ext   Data Reviewed: Basic Metabolic Panel:  Recent Labs Lab 10/27/13 2038 10/28/13 0535  NA 145 146  K 3.4* 3.7  CL 105 108  CO2 26 23  GLUCOSE 98 108*  BUN 17 16  CREATININE 1.51* 1.28  CALCIUM 9.2 9.0   Liver Function Tests:  Recent Labs Lab 10/27/13 2038 10/28/13 0535  AST 61* 47*  ALT 51 44  ALKPHOS 110 103  BILITOT 0.3 0.3    PROT 7.0 6.3  ALBUMIN 3.4* 3.1*   No results found for this basename: LIPASE, AMYLASE,  in the last 168 hours No results found for this basename: AMMONIA,  in the last 168 hours CBC:  Recent Labs Lab 10/27/13 2038 10/28/13 0535  WBC 5.0 4.7  NEUTROABS 2.4  --   HGB 11.2* 10.4*  HCT 32.6* 30.6*  MCV 93.7 94.2  PLT 175 149*   Cardiac Enzymes:  Recent Labs Lab 10/28/13 0023 10/28/13 0535  TROPONINI <0.30 <0.30   BNP (last 3 results)  Recent Labs  10/27/13 2038  PROBNP 692.6*   CBG:  Recent Labs Lab 10/27/13 2035  GLUCAP 105*    No results found for this or any previous visit (from the past 240 hour(s)).   Studies: Dg Chest Port 1 View  10/27/2013   CLINICAL DATA:  Syncope  EXAM: PORTABLE CHEST - 1 VIEW  COMPARISON:  Chest CT and chest radiograph, 10/02/2012.  FINDINGS: Cardiac silhouette is normal in size. Normal mediastinal and hilar contours. Clear lungs. No pleural effusion or pneumothorax. Right anterior chest wall AICD is stable in well positioned. Bony thorax is grossly intact.  IMPRESSION: No acute cardiopulmonary disease.   Electronically Signed   By: Amie Portland M.D.   On: 10/27/2013 20:50    Scheduled Meds: . aspirin  325 mg Oral Daily  . atorvastatin  20 mg Oral q1800  .  clopidogrel  75 mg Oral Q breakfast  . folic acid  1 mg Oral Daily  . heparin  5,000 Units Subcutaneous 3 times per day  . multivitamin with minerals  1 tablet Oral Daily  . sodium chloride  3 mL Intravenous Q12H  . thiamine  100 mg Oral Daily   Or  . thiamine  100 mg Intravenous Daily   Continuous Infusions: . sodium chloride 50 mL/hr at 10/28/13 0140   Antibiotics Given (last 72 hours)   None      Principal Problem:   Syncope Active Problems:   Seizure disorder   Alcohol abuse   HTN (hypertension)   AICD (automatic cardioverter/defibrillator) present   Nonischemic cardiomyopathy    Time spent: 25 min    Joseph Art  [facility] Pager [phone].  If 7PM-7AM, please contact night-coverage at [email or web address], password [email or web address] 10/28/2013, 11:04 AM  LOS: 1 day

## 2013-10-28 NOTE — H&P (Signed)
[facility] History and Physical  Patient: [name] Phillips  JYN:[id number]  DOB: 22-Dec-1955  DOS: the patient was seen and examined on 10/27/2013 PCP: Pola Corn, MD  Chief Complaint: Passing out  HPI: [name] is a 58 y.o. male with Past medical history of Nonischemic cardiomyopathy status post AICD placement, hypertension, CVA, seizure. The patient was brought in family as he passed out today. Patient mentions during the day he has been feeling tired and lethargy. Around 3 PM he drank significant amount of moonshine and some beer. He was not so she slept. When he woke up he went to restroom for bowel movement and felt that he was getting warm all over and felt pain all over.He also had 3 episodes of vomiting sitting on the commode. When his wife came to the restroom he all of a sudden passed out and And was not responding for few minutes. When he woke up he was still confused and disoriented at which time they called 911. EMS found that patient's blood pressure initially was in 60s which improved 100 when they were transporting him to the hospital.  The patient is coming from home. And at his baseline independent for most of his ADL.  Review of Systems: as mentioned in the history of present illness.  A Comprehensive review of the other systems is negative.  Past Medical History  Diagnosis Date  . CVA (cerebral vascular accident)   . Hypertension   . CHF (congestive heart failure)   . AICD (automatic cardioverter/defibrillator) -medtronic     Date of implant 2007  . Seizure   . Nonischemic cardiomyopathy     Catheterization 2005 normal coronary arteries; EF 15-20%;  2011 EF normal   Past Surgical History  Procedure Laterality Date  . Cardiac defibrillator placement     Social History:  reports that he has been smoking.  He does not have any smokeless tobacco history on file. He reports that he drinks alcohol. He reports that he does not use illicit drugs.  Allergies   Allergen Reactions  . Codeine Hives  . Penicillins Hives  . Prednisone Hives    History reviewed. No pertinent family history.  Prior to Admission medications   Medication Sig Start Date End Date Taking? Authorizing Provider  amLODipine (NORVASC) 5 MG tablet Take 1 tablet (5 mg total) by mouth daily. 08/01/13  Yes Gwyneth Sprout, MD  aspirin 325 MG tablet Take 325 mg by mouth daily.   Yes Historical Provider, MD  atorvastatin (LIPITOR) 10 MG tablet Take 20 mg by mouth daily. Take at 6pm 08/01/13  Yes Gwyneth Sprout, MD  carvedilol (COREG) 25 MG tablet Take 1 tablet (25 mg total) by mouth 2 (two) times daily with a meal. 08/01/13  Yes Gwyneth Sprout, MD  clopidogrel (PLAVIX) 75 MG tablet Take 1 tablet (75 mg total) by mouth daily with breakfast. 07/31/13  Yes Rolan Bucco, MD  ramipril (ALTACE) 10 MG capsule Take 10 mg by mouth 2 (two) times daily. 08/01/13  Yes Gwyneth Sprout, MD    Physical Exam: Filed Vitals:   10/27/13 2308 10/27/13 2315 10/27/13 2330 10/27/13 2345  BP: 114/98 115/86 116/87 107/66  Pulse: 96 93 94 59  Temp:      TempSrc:      Resp: 18 18 16 14   SpO2: 97% 95% 98% 96%    General: Alert, Awake and Oriented to Time, Place and Person. Appear in mild distress Eyes: PERRL ENT: Oral Mucosa clear moist. Neck: No JVD  Cardiovascular: S1 and S2 Present, No Murmur, Peripheral Pulses Present Respiratory: Bilateral Air entry equal and Decreased, Clear to Auscultation,  No Crackles,No wheezes Abdomen: Bowel Sound Present, Soft and Non tender Skin: No Rash Extremities: No Pedal edema, No calf tenderness Neurologic: Grossly no focal neuro deficit. Labs on Admission:  CBC:  Recent Labs Lab 10/27/13 2038  WBC 5.0  NEUTROABS 2.4  HGB 11.2*  HCT 32.6*  MCV 93.7  PLT 175    CMP     Component Value Date/Time   NA 145 10/27/2013 2038   K 3.4* 10/27/2013 2038   CL 105 10/27/2013 2038   CO2 26 10/27/2013 2038   GLUCOSE 98 10/27/2013 2038   BUN 17 10/27/2013 2038    CREATININE 1.51* 10/27/2013 2038   CALCIUM 9.2 10/27/2013 2038   PROT 7.0 10/27/2013 2038   ALBUMIN 3.4* 10/27/2013 2038   AST 61* 10/27/2013 2038   ALT 51 10/27/2013 2038   ALKPHOS 110 10/27/2013 2038   BILITOT 0.3 10/27/2013 2038   GFRNONAA 49* 10/27/2013 2038   GFRAA 56* 10/27/2013 2038    No results found for this basename: LIPASE, AMYLASE,  in the last 168 hours No results found for this basename: AMMONIA,  in the last 168 hours  No results found for this basename: CKTOTAL, CKMB, CKMBINDEX, TROPONINI,  in the last 168 hours BNP (last 3 results)  Recent Labs  10/27/13 2038  PROBNP 692.6*    Radiological Exams on Admission: Dg Chest Port 1 View  10/27/2013   CLINICAL DATA:  Syncope  EXAM: PORTABLE CHEST - 1 VIEW  COMPARISON:  Chest CT and chest radiograph, 10/02/2012.  FINDINGS: Cardiac silhouette is normal in size. Normal mediastinal and hilar contours. Clear lungs. No pleural effusion or pneumothorax. Right anterior chest wall AICD is stable in well positioned. Bony thorax is grossly intact.  IMPRESSION: No acute cardiopulmonary disease.   Electronically Signed   By: Amie Portland M.D.   On: 10/27/2013 20:50   EKG: Independently reviewed. normal sinus rhythm, nonspecific ST and T waves changes.  Assessment/Plan Principal Problem:   Syncope Active Problems:   Seizure disorder   Alcohol abuse   HTN (hypertension)   AICD (automatic cardioverter/defibrillator) present   Nonischemic cardiomyopathy   1. Syncope The patient is presenting with complaints of passing out.He was hypotensive initially but his blood pressure has significantly improved with IV hydration. He had multiple episodes of vomiting. EKG and initial blood work does not show any acute abnormality. Chest x-ray is clear.AICD evaluation was not showing any acute abnormality as well. With that the patient will be admitted to the hospital for syncope. We will monitor him on telemetry. Obtain serial troponin an echocardiogram in the  morning.  He has a history of seizures in the setting of chronic alcohol abuse and not on any AED. At present this episode does not appear to be seizure and we will monitor him with serial neuro checks.  2.Alcohol abuse Possible cause of patient's current presentation. Patient denies drinking alcohol on a daily basis. Monitor for withdrawal.  3.Hypertension. At present all his blood pressure medications are on hold which should be restarted once his blood pressure is normalized.  DVT Prophylaxis: subcutaneous Heparin Nutrition: cardiac diet  Code Status: full  Family Communication: wife was present at bedside, opportunity was given to ask question and all questions were answered satisfactorily at the time of interview. Disposition: Admitted to observation in telemetry unit.  Author: Lynden Oxford, MD [facility] Pager: [phone]  If 7PM-7AM, please contact night-coverage [email or web address] Password [email or web address]

## 2013-10-29 ENCOUNTER — Encounter (HOSPITAL_COMMUNITY): Payer: Self-pay | Admitting: *Deleted

## 2013-10-29 DIAGNOSIS — Z95810 Presence of automatic (implantable) cardiac defibrillator: Secondary | ICD-10-CM

## 2013-10-29 DIAGNOSIS — I428 Other cardiomyopathies: Secondary | ICD-10-CM

## 2013-10-29 DIAGNOSIS — I471 Supraventricular tachycardia: Secondary | ICD-10-CM

## 2013-10-29 LAB — BASIC METABOLIC PANEL
BUN: 14 mg/dL (ref 6–23)
CO2: 23 mEq/L (ref 19–32)
Calcium: 9 mg/dL (ref 8.4–10.5)
Chloride: 102 mEq/L (ref 96–112)
Creatinine, Ser: 1.1 mg/dL (ref 0.50–1.35)
GFR calc Af Amer: 82 mL/min — ABNORMAL LOW (ref >90)
GFR calc non Af Amer: 71 mL/min — ABNORMAL LOW (ref >90)
Glucose, Bld: 111 mg/dL — ABNORMAL HIGH (ref 70–99)
Potassium: 3.2 mEq/L — ABNORMAL LOW (ref 3.7–5.3)
Sodium: 139 mEq/L (ref 137–147)

## 2013-10-29 MED ORDER — ADENOSINE 6 MG/2ML IV SOLN
INTRAVENOUS | Status: AC
  Administered 2013-10-29: 12 mg
  Filled 2013-10-29: qty 4

## 2013-10-29 MED ORDER — MAGNESIUM SULFATE 40 MG/ML IJ SOLN
2.0000 g | Freq: Once | INTRAMUSCULAR | Status: AC
  Administered 2013-10-29: 2 g via INTRAVENOUS
  Filled 2013-10-29: qty 50

## 2013-10-29 MED ORDER — RAMIPRIL 10 MG PO CAPS
10.0000 mg | ORAL_CAPSULE | Freq: Two times a day (BID) | ORAL | Status: DC
  Administered 2013-10-29 – 2013-10-30 (×3): 10 mg via ORAL
  Filled 2013-10-29 (×5): qty 1

## 2013-10-29 MED ORDER — POTASSIUM CHLORIDE CRYS ER 20 MEQ PO TBCR
40.0000 meq | EXTENDED_RELEASE_TABLET | Freq: Once | ORAL | Status: AC
  Administered 2013-10-29: 40 meq via ORAL
  Filled 2013-10-29: qty 2

## 2013-10-29 MED ORDER — MAGNESIUM SULFATE 40 MG/ML IJ SOLN
2.0000 g | Freq: Once | INTRAMUSCULAR | Status: DC
  Filled 2013-10-29: qty 50

## 2013-10-29 MED ORDER — ADENOSINE 6 MG/2ML IV SOLN
INTRAVENOUS | Status: AC
  Administered 2013-10-29: 6 mg
  Filled 2013-10-29: qty 4

## 2013-10-29 MED ORDER — METOPROLOL TARTRATE 1 MG/ML IV SOLN
2.5000 mg | Freq: Once | INTRAVENOUS | Status: AC
  Administered 2013-10-29: 2.5 mg via INTRAVENOUS
  Filled 2013-10-29: qty 5

## 2013-10-29 MED ORDER — FUROSEMIDE 10 MG/ML IJ SOLN
40.0000 mg | Freq: Once | INTRAMUSCULAR | Status: AC
  Administered 2013-10-29: 40 mg via INTRAVENOUS
  Filled 2013-10-29: qty 4

## 2013-10-29 NOTE — Consult Note (Signed)
Ref: SPRUILL,JEROME O, MD   Subjective:  Short of breath at rest and activity with log standing history of smoking ( 40 + yrs). Also had AVNRT and awaiting ICD replacement or battery and lead change. Already on carvedilol for CHF. May not tolerate diltiazem and additional B-blocker due to hypotension and COPD.  Objective:  Vital Signs in the last 24 hours: Temp:  [97.8 F (36.6 C)-98.5 F (36.9 C)] 97.8 F (36.6 C) (06/05 1420) Pulse Rate:  [94-101] 96 (06/05 1420) Cardiac Rhythm:  [-] Normal sinus rhythm (06/05 0733) Resp:  [18-20] 20 (06/05 1420) BP: (116-164)/(87-123) 155/123 mmHg (06/05 1420) SpO2:  [83 %-96 %] 88 % (06/05 1420) Weight:  [79.4 kg (175 lb 0.7 oz)] 79.4 kg (175 lb 0.7 oz) (06/05 0500)  Physical Exam: BP Readings from Last 1 Encounters:  10/29/13 155/123    Wt Readings from Last 1 Encounters:  10/29/13 79.4 kg (175 lb 0.7 oz)    Weight change:   HEENT: [place]/AT, Eyes-Brown, PERL, EOMI, Conjunctiva-Pale pink, Sclera-Non-icteric Neck: + JVD, No bruit, Trachea midline. No thyromegaly Lungs:  Few crackles with prolonged expiration, Bilateral. Cardiac:  Regular rhythm, normal S1 and S2, no S3.  Abdomen:  Soft, non-tender. Extremities:  No edema present. No cyanosis. No clubbing. CNS: AxOx3, Cranial nerves grossly intact, moves all 4 extremities. Skin: Warm and dry.   Intake/Output from previous day: 06/04 0701 - 06/05 0700 In: 1323 [P.O.:870; I.V.:453] Out: 1950 [Urine:1950]    Lab Results: BMET    Component Value Date/Time   NA 139 10/29/2013 0422   NA 146 10/28/2013 0535   NA 145 10/27/2013 2038   K 3.2* 10/29/2013 0422   K 3.7 10/28/2013 0535   K 3.4* 10/27/2013 2038   CL 102 10/29/2013 0422   CL 108 10/28/2013 0535   CL 105 10/27/2013 2038   CO2 23 10/29/2013 0422   CO2 23 10/28/2013 0535   CO2 26 10/27/2013 2038   GLUCOSE 111* 10/29/2013 0422   GLUCOSE 108* 10/28/2013 0535   GLUCOSE 98 10/27/2013 2038   BUN 14 10/29/2013 0422   BUN 16 10/28/2013 0535   BUN 17 10/27/2013 2038    CREATININE 1.10 10/29/2013 0422   CREATININE 1.28 10/28/2013 0535   CREATININE 1.51* 10/27/2013 2038   CALCIUM 9.0 10/29/2013 0422   CALCIUM 9.0 10/28/2013 0535   CALCIUM 9.2 10/27/2013 2038   GFRNONAA 71* 10/29/2013 0422   GFRNONAA 59* 10/28/2013 0535   GFRNONAA 49* 10/27/2013 2038   GFRAA 82* 10/29/2013 0422   GFRAA 69* 10/28/2013 0535   GFRAA 56* 10/27/2013 2038   CBC    Component Value Date/Time   WBC 4.7 10/28/2013 0535   RBC 3.25* 10/28/2013 0535   HGB 10.4* 10/28/2013 0535   HCT 30.6* 10/28/2013 0535   PLT 149* 10/28/2013 0535   MCV 94.2 10/28/2013 0535   MCH 32.0 10/28/2013 0535   MCHC 34.0 10/28/2013 0535   RDW 15.4 10/28/2013 0535   LYMPHSABS 1.9 10/27/2013 2038   MONOABS 0.5 10/27/2013 2038   EOSABS 0.2 10/27/2013 2038   BASOSABS 0.0 10/27/2013 2038   HEPATIC Function Panel  Recent Labs  10/27/13 2038 10/28/13 0535  PROT 7.0 6.3   HEMOGLOBIN A1C No components found with this basename: HGA1C,  MPG   CARDIAC ENZYMES Lab Results  Component Value Date   CKTOTAL 144 08/08/2011   CKMB 2.2 12/03/2010   TROPONINI <0.30 10/28/2013   TROPONINI <0.30 10/28/2013   TROPONINI <0.30 10/28/2013   BNP  Recent Labs  10/27/13 2038  PROBNP 692.6*   TSH No results found for this basename: TSH,  in the last 8760 hours CHOLESTEROL No results found for this basename: CHOL,  in the last 8760 hours  Scheduled Meds: . amLODipine  5 mg Oral Daily  . aspirin  325 mg Oral Daily  . atorvastatin  20 mg Oral q1800  . carvedilol  25 mg Oral BID WC  . clopidogrel  75 mg Oral Q breakfast  . folic acid  1 mg Oral Daily  . heparin  5,000 Units Subcutaneous 3 times per day  . multivitamin with minerals  1 tablet Oral Daily  . pantoprazole  40 mg Oral Daily  . ramipril  10 mg Oral BID  . sodium chloride  3 mL Intravenous Q12H  . thiamine  100 mg Oral Daily   Continuous Infusions:  PRN Meds:.acetaminophen, acetaminophen, LORazepam, LORazepam, ondansetron (ZOFRAN) IV, ondansetron  Assessment/Plan: Syncope COPD SVT Non  sustained VT H/O alcohol use Tobacco use disorder Anemia, ? etiology S/P AICD  Non-ischemic cardiomyopathy H/IO cerebrovascular accident Hypokalemia  May need anemia work-up and r/o PE. Low dose diltiazem or amiodarone use if needed if supplemental oxygen and improving Hgb do not work.   LOS: 2 days    Orpah Cobb  MD  10/29/2013, 6:27 PM

## 2013-10-29 NOTE — ED Provider Notes (Signed)
Medical screening examination/treatment/procedure(s) were conducted as a shared visit with non-physician practitioner(s) or resident and myself. I personally evaluated the patient during the encounter and agree with the findings and plan unless otherwise indicated.  I have personally reviewed any xrays and/ or EKG's with the provider and I agree with interpretation.  Patient with CVA, CHF, AICD, seizures, cardiomyopathy presents to ER after syncope episode. Patient is on the commode and does not recall details of passing out. Patient had been drinking alcohol earlier today. Patient denies chest pain shortness breath or worsening weight changes or leg swelling. No fevers or infectious symptoms. Patient had low blood pressure on arrival per EMS. Fluid bolus, and the ER and patient feels improved with no current symptoms. Patient denies chest pain or short of breath today or bleeding. On exam lungs are clear, minimal leg swelling bilateral lower extremities, well-appearing, neck supple, dry mm, abdomen soft nontender, heart rate regular. For cardiac workup, fluid bolus and recheck.  Plan for tele observation with cardiac hx and hypotension on arrival. Sxs improved in ED and bp improved.   Plan for medtronic for evaluation of AICD. EKG overall similar to previous.  Syncope, Hypotension, Cardiomyopathy   Enid Skeens, MD 10/29/13 Earle Gell

## 2013-10-29 NOTE — Progress Notes (Signed)
RN paged NP secondary to pt having acute HR in the 140s. 12 lead EKG and tele revealed SVT. BP stable. Dr. Allena Katz called who confirmed SVT and came to bedside. Vagal technique tried without success. Adenosine 6mg  x 2 pushed without success. Adenosine 12mg  pushed with success. HR in the 90s afterwards. Pt tolerated procedure well.  Will report off to attending. May want to call cardio this am.  Jimmye Norman, NP [facility]

## 2013-10-29 NOTE — Progress Notes (Signed)
Patient's heart rate sustaining in 140s. Pt asymptomatic, lying in bed. MD notified. EKG assessed. SVT. New orders entered.   Lalla Laham,MSN,RN

## 2013-10-29 NOTE — Progress Notes (Addendum)
Patient's heart rate sustaining in 140s. [facility] and NP arrived at bedside with Rapid Response RN to administer pt adenosine. Pt administered  6mg  of adenosine at 0346 & 6mg  of adenosine at 0348, B/P 116/113, hrt rate 143, temp 98.5, 90% on ra. Pt placed on 2l 02 n/c, patient administered 12mg  of adenosine at 0356, pt converted to NSR 90s. B/P 150/108. Pt tolerated procedure. Pt lying in bed. New orders entered. Will continue to monitor.   Keller Bounds,MSN,RN

## 2013-10-29 NOTE — Progress Notes (Signed)
UR completed. Patient changed to inpatient- required IV adenosine X 2

## 2013-10-29 NOTE — Consult Note (Signed)
ELECTROPHYSIOLOGY CONSULT NOTE    Patient ID: [name] Phillips MRN: [id number], DOB/AGE: 1955-12-15 58 y.o.  Admit date: 10/27/2013 Date of Consult: 10-29-2013  Primary Physician: Pola Corn, MD Electrophysiologist: Graciela Husbands  Reason for Consultation: SVT  HPI:  [name] Phillips is a 58 y.o. male with a past medical history significant for hypertension, non ischemic cardiomyopathy, prior CVA, and alcohol abuse.  On the day of admission, he got home from a doctors appointment, got dizzy, went inside to have a bowel movement, became flushed and diaphoretic and passed out.  He had been nauseated that day and vomited while on the commode.  He then lost consciousness and his wife could not get him to respond.  He was disoriented and confused when he woke up and EMS was called.  On their arrival by report, SBP was in the 60's -->improved to 100's en route.    While on telemetry, he developed narrow complex tachycardia at a rate of 140 that did not respond to vagal maneuvers.  He was given Adenosine 6mg  without termination, tachycardia terminated with 12mg  of adenosine.  He denies prior palpitations.  He states the last time he passed out was in march when he was walking outside on a hot day and thinks he got dehydrated.    He denies recent chest pain or shortness of breath.  He states he is always chilled.  He denies objective fever.  He has been nauseated for several days.  ROS is otherwise negative.   Echo this admission demonstrated EF 25-30%, severe hypokinesis of the inferior myocardium, mild MR, LA moderately to severely dilated by read (measured at 36), RA moderately dilated.   Lab work is reviewed and notable for increased ETOH level, negative cardiac enzymes, slight anemia, slight hypokalemia.  Device interrogation demonstrates device nearing ERI (2.62, ERI 2.61).  6949 lead with stable impedence and SIC of 0.  Multiple episodes of NSVT, episodes of VT treated with ATP successfully.  The  patient has not had his device checked since June of 2014.  He has no-showed 5 appointments for device interrogation.  The importance of compliance with device follow up was stressed today.   EP has been asked to evaluate for treatment options.    Past Medical History  Diagnosis Date  . CVA (cerebral vascular accident)   . Hypertension   . CHF (congestive heart failure)   . Seizure   . Nonischemic cardiomyopathy     Catheterization 2005 normal coronary arteries; EF 15-20%;  2011 EF normal  . Alcohol abuse      Surgical History:  Past Surgical History  Procedure Laterality Date  . Cardiac defibrillator placement  2007    MDT [employer] single chamber ICD implanted with 6949 lead by Dr Ty Hilts for primary prevention     Prescriptions prior to admission  Medication Sig Dispense Refill  . amLODipine (NORVASC) 5 MG tablet Take 1 tablet (5 mg total) by mouth daily.  30 tablet  0  . aspirin 325 MG tablet Take 325 mg by mouth daily.      Marland Kitchen atorvastatin (LIPITOR) 10 MG tablet Take 20 mg by mouth daily. Take at 6pm      . carvedilol (COREG) 25 MG tablet Take 1 tablet (25 mg total) by mouth 2 (two) times daily with a meal.  60 tablet  0  . clopidogrel (PLAVIX) 75 MG tablet Take 1 tablet (75 mg total) by mouth daily with breakfast.  30 tablet  0  . ramipril (  ALTACE) 10 MG capsule Take 10 mg by mouth 2 (two) times daily.        Inpatient Medications:  . amLODipine  5 mg Oral Daily  . aspirin  325 mg Oral Daily  . atorvastatin  20 mg Oral q1800  . carvedilol  25 mg Oral BID WC  . clopidogrel  75 mg Oral Q breakfast  . folic acid  1 mg Oral Daily  . heparin  5,000 Units Subcutaneous 3 times per day  . multivitamin with minerals  1 tablet Oral Daily  . pantoprazole  40 mg Oral Daily  . ramipril  10 mg Oral BID  . sodium chloride  3 mL Intravenous Q12H  . thiamine  100 mg Oral Daily    Allergies:  Allergies  Allergen Reactions  . Codeine Hives  . Penicillins Hives  . Prednisone Hives     History   Social History  . Marital Status: Married    Spouse Name: N/A    Number of Children: N/A  . Years of Education: N/A   Occupational History  . Not on file.   Social History Main Topics  . Smoking status: Current Every Day Smoker -- 1.00 packs/day for 39 years  . Smokeless tobacco: Not on file  . Alcohol Use: Yes     Comment: moonshine, quart per day 39 years  . Drug Use: No  . Sexual Activity:    Other Topics Concern  . Not on file   Social History Narrative  . No narrative on file     Family History  Problem Relation Age of Onset  . Heart disease Father   . Hypertension      Phsical exam  diskempt appearing middle aged man, NAD HEENT: Unremarkable,[place], AT Neck: 7 cm JVD, no thyromegally Back:  No CVA tenderness Lungs:  Scattered rales HEART:  Regular rate rhythm, no murmurs, no rubs, no clicks Abd:  soft, positive bowel sounds, no organomegally, no rebound, no guarding Ext:  2 plus pulses, no edema, no cyanosis, no clubbing Skin:  No rashes no nodules Neuro:  CN II through XII intact, motor grossly intact   BP 164/112  Pulse 94  Temp(Src) 98.5 F (36.9 C) (Oral)  Resp 18  Ht 6\' 3"  (1.905 m)  Wt 175 lb 0.7 oz (79.4 kg)  BMI 21.88 kg/m2  SpO2 94%   Labs:   Lab Results  Component Value Date   WBC 4.7 10/28/2013   HGB 10.4* 10/28/2013   HCT 30.6* 10/28/2013   MCV 94.2 10/28/2013   PLT 149* 10/28/2013     Recent Labs Lab 10/28/13 0535 10/29/13 0422  NA 146 139  K 3.7 3.2*  CL 108 102  CO2 23 23  BUN 16 14  CREATININE 1.28 1.10  CALCIUM 9.0 9.0  PROT 6.3  --   BILITOT 0.3  --   ALKPHOS 103  --   ALT 44  --   AST 47*  --   GLUCOSE 108* 111*     Radiology/Studies: Dg Chest Port 1 View 10/27/2013   CLINICAL DATA:  Syncope  EXAM: PORTABLE CHEST - 1 VIEW  COMPARISON:  Chest CT and chest radiograph, 10/02/2012.  FINDINGS: Cardiac silhouette is normal in size. Normal mediastinal and hilar contours. Clear lungs. No pleural effusion or  pneumothorax. Right anterior chest wall AICD is stable in well positioned. Bony thorax is grossly intact.  IMPRESSION: No acute cardiopulmonary disease.   Electronically Signed   By: Renard Hamper.D.  On: 10/27/2013 20:50    EKG:6-3, sinus rhythm rate 67 with PAC's, normal intervals 10-29-13 narrow complex tachycardia, rate 144, likely AVNRT  TELEMETRY: sinus rhythm with frequent atrial ectopy; narrow complex tachycardia last night terminated with adenosine  DEVICE Interogation and HISTORY: MDT [employer] single chamber ICD implanted 2007 by Dr Ty Hilts for primary prevention - [phone] lead. I have reviewed the ICD interogation data and he has had multiple episodes of SVT and VT, none of which correlate with his episode of syncope. Will reprogram device with a monitor zone.  1. Syncope 2. 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 Non-ischemic CM 4. Hypotension I am uncertain of the etiology of his syncope. He needs to have pulmonary embolism excluded. 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 He has sob and if no pulmonary embolism then would treat with IV lasix for diuresis. If his supraventricular arrhythmias cannot be controlled, then catheter ablation would be warranted.  Leonia Reeves.D.

## 2013-10-29 NOTE — Progress Notes (Signed)
PROGRESS NOTE  CRIT SCHOR ZOX:[id number] DOB: 1956/03/31 DOA: 10/27/2013 PCP: Pola Corn, MD  Assessment/Plan: Syncope  -hypotensive initially but his blood pressure has significantly improved with IV hydration.  -EKG and initial blood work does not show any acute abnormality Chest x-ray is clear. AICD evaluation was not showing any acute abnormality as well.  -echocardiogram shows EF of 25% which is slightly better than before where EF was 15-20% He has a history of seizures in the setting of chronic alcohol abuse and not on any AED.  At present this episode does not appear to be seizure and we will monitor him with serial neuro checks.  -had episode of SVT which resolved- on BB- ? If needs different med- will ask cards to weigh in  Alcohol abuse  Possible cause of patient's current presentation. Patient denies drinking alcohol on a daily basis.  CIWA  Hypertension.  At present all his blood pressure medications are on hold which should be restarted once his blood pressure is normalized.  Hypokalemia/hypomagnesium -replete   Code Status: full Family Communication: patient/wife- not interested in quiting alcohol Disposition Plan:    Consultants:  none  Procedures:      HPI/Subjective: Wanting to go home Not interested in cutting back or quitting epidode of SVT last PM  Objective: Filed Vitals:   10/29/13 0830  BP: 164/112  Pulse: 94  Temp:   Resp:     Intake/Output Summary (Last 24 hours) at 10/29/13 1246 Last data filed at 10/29/13 1200  Gross per 24 hour  Intake    813 ml  Output   2550 ml  Net  -1737 ml   Filed Weights   10/29/13 0500  Weight: 79.4 kg (175 lb 0.7 oz)    Exam:   General:  A+Ox3, NAD  Cardiovascular: rrr  Respiratory: clear  Abdomen: +BS, soft  Musculoskeletal: moves all 4 ext   Data Reviewed: Basic Metabolic Panel:  Recent Labs Lab 10/27/13 2038 10/28/13 0535 10/28/13 1155 10/29/13 0422  NA 145 146  --   139  K 3.4* 3.7  --  3.2*  CL 105 108  --  102  CO2 26 23  --  23  GLUCOSE 98 108*  --  111*  BUN 17 16  --  14  CREATININE 1.51* 1.28  --  1.10  CALCIUM 9.2 9.0  --  9.0  MG  --   --  1.3*  --    Liver Function Tests:  Recent Labs Lab 10/27/13 2038 10/28/13 0535  AST 61* 47*  ALT 51 44  ALKPHOS 110 103  BILITOT 0.3 0.3  PROT 7.0 6.3  ALBUMIN 3.4* 3.1*   No results found for this basename: LIPASE, AMYLASE,  in the last 168 hours No results found for this basename: AMMONIA,  in the last 168 hours CBC:  Recent Labs Lab 10/27/13 2038 10/28/13 0535  WBC 5.0 4.7  NEUTROABS 2.4  --   HGB 11.2* 10.4*  HCT 32.6* 30.6*  MCV 93.7 94.2  PLT 175 149*   Cardiac Enzymes:  Recent Labs Lab 10/28/13 0023 10/28/13 0535 10/28/13 1155  TROPONINI <0.30 <0.30 <0.30   BNP (last 3 results)  Recent Labs  10/27/13 2038  PROBNP 692.6*   CBG:  Recent Labs Lab 10/27/13 2035  GLUCAP 105*    No results found for this or any previous visit (from the past 240 hour(s)).   Studies: Dg Chest Port 1 View  10/27/2013   CLINICAL DATA:  Syncope  EXAM: PORTABLE CHEST - 1 VIEW  COMPARISON:  Chest CT and chest radiograph, 10/02/2012.  FINDINGS: Cardiac silhouette is normal in size. Normal mediastinal and hilar contours. Clear lungs. No pleural effusion or pneumothorax. Right anterior chest wall AICD is stable in well positioned. Bony thorax is grossly intact.  IMPRESSION: No acute cardiopulmonary disease.   Electronically Signed   By: Amie Portland M.D.   On: 10/27/2013 20:50    Scheduled Meds: . amLODipine  5 mg Oral Daily  . aspirin  325 mg Oral Daily  . atorvastatin  20 mg Oral q1800  . carvedilol  25 mg Oral BID WC  . clopidogrel  75 mg Oral Q breakfast  . folic acid  1 mg Oral Daily  . heparin  5,000 Units Subcutaneous 3 times per day  . multivitamin with minerals  1 tablet Oral Daily  . pantoprazole  40 mg Oral Daily  . ramipril  10 mg Oral BID  . sodium chloride  3 mL  Intravenous Q12H  . thiamine  100 mg Oral Daily   Continuous Infusions:   Antibiotics Given (last 72 hours)   None      Principal Problem:   Syncope Active Problems:   Seizure disorder   Alcohol abuse   HTN (hypertension)   AICD (automatic cardioverter/defibrillator) present   Nonischemic cardiomyopathy    Time spent: 25 min    Joseph Art  [facility] Pager [phone]. If 7PM-7AM, please contact night-coverage at [email or web address], password [facility] 10/29/2013, 12:46 PM  LOS: 2 days

## 2013-10-30 DIAGNOSIS — I5020 Unspecified systolic (congestive) heart failure: Secondary | ICD-10-CM

## 2013-10-30 LAB — CBC
HCT: 34.7 % — ABNORMAL LOW (ref 39.0–52.0)
Hemoglobin: 11.7 g/dL — ABNORMAL LOW (ref 13.0–17.0)
MCH: 31.5 pg (ref 26.0–34.0)
MCHC: 33.7 g/dL (ref 30.0–36.0)
MCV: 93.5 fL (ref 78.0–100.0)
Platelets: 152 K/uL (ref 150–400)
RBC: 3.71 MIL/uL — ABNORMAL LOW (ref 4.22–5.81)
RDW: 14.9 % (ref 11.5–15.5)
WBC: 5.8 K/uL (ref 4.0–10.5)

## 2013-10-30 LAB — D-DIMER, QUANTITATIVE (NOT AT ARMC): D-Dimer, Quant: 0.48 ug/mL-FEU (ref 0.00–0.48)

## 2013-10-30 LAB — BASIC METABOLIC PANEL WITH GFR
BUN: 17 mg/dL (ref 6–23)
CO2: 27 meq/L (ref 19–32)
Calcium: 9.5 mg/dL (ref 8.4–10.5)
Chloride: 103 meq/L (ref 96–112)
Creatinine, Ser: 1.28 mg/dL (ref 0.50–1.35)
GFR calc Af Amer: 69 mL/min — ABNORMAL LOW
GFR calc non Af Amer: 59 mL/min — ABNORMAL LOW
Glucose, Bld: 107 mg/dL — ABNORMAL HIGH (ref 70–99)
Potassium: 3.7 meq/L (ref 3.7–5.3)
Sodium: 141 meq/L (ref 137–147)

## 2013-10-30 MED ORDER — NICOTINE 7 MG/24HR TD PT24
7.0000 mg | MEDICATED_PATCH | TRANSDERMAL | Status: DC
  Filled 2013-10-30: qty 1

## 2013-10-30 MED ORDER — PANTOPRAZOLE SODIUM 40 MG PO TBEC: 40.0000 mg | DELAYED_RELEASE_TABLET | Freq: Every day | ORAL | Status: DC

## 2013-10-30 MED ORDER — FOLIC ACID 1 MG PO TABS: 1.0000 mg | ORAL_TABLET | Freq: Every day | ORAL | Status: DC

## 2013-10-30 MED ORDER — THIAMINE HCL 100 MG PO TABS: 100.0000 mg | ORAL_TABLET | Freq: Every day | ORAL | Status: DC

## 2013-10-30 MED ORDER — ADULT MULTIVITAMIN W/MINERALS CH: 1.0000 | ORAL_TABLET | Freq: Every day | ORAL | Status: DC

## 2013-10-30 NOTE — Progress Notes (Signed)
Pt saturations are 99% on room air.  Pt ambulated in the hallway without oxygen approximately 300 feet with oxygen saturations 97-99 % on sroom air.  No SOB. Pt resting with call bell within reach.  Will continue to monitor. Thomas Hoff, RN

## 2013-10-30 NOTE — Progress Notes (Signed)
Ref: Pola CornSPRUILL,JEROME O, MD   Subjective:  Feeling better. Heart rate in 50's. Hypokalemia corrected. Afebrile.  Objective:  Vital Signs in the last 24 hours: Temp:  [97.7 F (36.5 C)-98.4 F (36.9 C)] 98.4 F (36.9 C) (06/06 0500) Pulse Rate:  [58-96] 58 (06/06 0500) Cardiac Rhythm:  [-] Normal sinus rhythm (06/06 0833) Resp:  [18-20] 18 (06/06 0500) BP: (134-158)/(89-123) 158/89 mmHg (06/06 0500) SpO2:  [83 %-98 %] 98 % (06/06 0500) Weight:  [80.1 kg (176 lb 9.4 oz)] 80.1 kg (176 lb 9.4 oz) (06/06 0500)  Physical Exam: BP Readings from Last 1 Encounters:  10/30/13 158/89    Wt Readings from Last 1 Encounters:  10/30/13 80.1 kg (176 lb 9.4 oz)    Weight change: 0.7 kg (1 lb 8.7 oz)  HEENT: [place]/AT, Eyes-Brown, PERL, EOMI, Conjunctiva-Pale pink, Sclera-Non-icteric Neck: No JVD, No bruit, Trachea midline. Lungs:  Clearing, Bilateral. Cardiac:  Regular rhythm, normal S1 and S2, no S3.  Abdomen:  Soft, non-tender. Extremities:  No edema present. No cyanosis. No clubbing. CNS: AxOx3, Cranial nerves grossly intact, moves all 4 extremities. Right handed. Skin: Warm and dry.   Intake/Output from previous day: 06/05 0701 - 06/06 0700 In: -  Out: 1475 [Urine:1475]    Lab Results: BMET    Component Value Date/Time   NA 141 10/30/2013 0357   NA 139 10/29/2013 0422   NA 146 10/28/2013 0535   K 3.7 10/30/2013 0357   K 3.2* 10/29/2013 0422   K 3.7 10/28/2013 0535   CL 103 10/30/2013 0357   CL 102 10/29/2013 0422   CL 108 10/28/2013 0535   CO2 27 10/30/2013 0357   CO2 23 10/29/2013 0422   CO2 23 10/28/2013 0535   GLUCOSE 107* 10/30/2013 0357   GLUCOSE 111* 10/29/2013 0422   GLUCOSE 108* 10/28/2013 0535   BUN 17 10/30/2013 0357   BUN 14 10/29/2013 0422   BUN 16 10/28/2013 0535   CREATININE 1.28 10/30/2013 0357   CREATININE 1.10 10/29/2013 0422   CREATININE 1.28 10/28/2013 0535   CALCIUM 9.5 10/30/2013 0357   CALCIUM 9.0 10/29/2013 0422   CALCIUM 9.0 10/28/2013 0535   GFRNONAA 59* 10/30/2013 0357   GFRNONAA 71*  10/29/2013 0422   GFRNONAA 59* 10/28/2013 0535   GFRAA 69* 10/30/2013 0357   GFRAA 82* 10/29/2013 0422   GFRAA 69* 10/28/2013 0535   CBC    Component Value Date/Time   WBC 4.7 10/28/2013 0535   RBC 3.25* 10/28/2013 0535   HGB 10.4* 10/28/2013 0535   HCT 30.6* 10/28/2013 0535   PLT 149* 10/28/2013 0535   MCV 94.2 10/28/2013 0535   MCH 32.0 10/28/2013 0535   MCHC 34.0 10/28/2013 0535   RDW 15.4 10/28/2013 0535   LYMPHSABS 1.9 10/27/2013 2038   MONOABS 0.5 10/27/2013 2038   EOSABS 0.2 10/27/2013 2038   BASOSABS 0.0 10/27/2013 2038   HEPATIC Function Panel  Recent Labs  10/27/13 2038 10/28/13 0535  PROT 7.0 6.3   HEMOGLOBIN A1C No components found with this basename: HGA1C,  MPG   CARDIAC ENZYMES Lab Results  Component Value Date   CKTOTAL 144 08/08/2011   CKMB 2.2 12/03/2010   TROPONINI <0.30 10/28/2013   TROPONINI <0.30 10/28/2013   TROPONINI <0.30 10/28/2013   BNP  Recent Labs  10/27/13 2038  PROBNP 692.6*   TSH No results found for this basename: TSH,  in the last 8760 hours CHOLESTEROL No results found for this basename: CHOL,  in the last 8760 hours  Scheduled Meds: . amLODipine  5 mg Oral Daily  . aspirin  325 mg Oral Daily  . atorvastatin  20 mg Oral q1800  . carvedilol  25 mg Oral BID WC  . clopidogrel  75 mg Oral Q breakfast  . folic acid  1 mg Oral Daily  . heparin  5,000 Units Subcutaneous 3 times per day  . multivitamin with minerals  1 tablet Oral Daily  . nicotine  7 mg Transdermal Daily  . pantoprazole  40 mg Oral Daily  . ramipril  10 mg Oral BID  . sodium chloride  3 mL Intravenous Q12H  . thiamine  100 mg Oral Daily   Continuous Infusions:  PRN Meds:.acetaminophen, acetaminophen, LORazepam, LORazepam, ondansetron (ZOFRAN) IV, ondansetron  Assessment/Plan: Syncope  COPD  SVT  Non sustained VT  H/O alcohol use  Tobacco use disorder  Anemia, ? etiology  S/P AICD  Non-ischemic cardiomyopathy  H/IO cerebrovascular accident  Hypokalemia-corrected  Continue medical  treatment.   LOS: 3 days    Orpah Cobb  MD  10/30/2013, 9:51 AM

## 2013-10-30 NOTE — Progress Notes (Signed)
Pt/family given discharge instructions, medication lists, follow up appointments, and when to call the doctor.  Pt/family verbalizes understanding. Pt given signs and symptoms of infection. Encouraged pt to quit smoking and drinking alcohol and drink more water.  Pt given some options for better water consumption. Pt/wife agreed for need for better overall care. Pt agrees need for supplemental drinks and vitamins. Thomas Hoff, RN

## 2013-10-30 NOTE — Progress Notes (Addendum)
PROGRESS NOTE  [name] Phillips FAO:[id number] DOB: 1955/08/29 DOA: 10/27/2013 PCP: Pola Corn, MD  Assessment/Plan: Syncope with alcohol level of 245 -hypotensive initially but his blood pressure has significantly improved with IV hydration- patient does not drink water at home-just alcohol -EKG and initial blood work does not show any acute abnormality Chest x-ray is clear. AICD evaluation did not show any acute abnormality as well.  -echocardiogram shows EF of 25% which is slightly better than before where EF was 15-20% He has a history of seizures in the setting of chronic alcohol abuse and not on any AED.  At present this episode does not appear to be seizure and we will monitor him with serial neuro checks.  -had episode of SVT which resolved- on BB- ? If needs different med- will ask cards to weigh in -cards suggested to r/o PE: will get d dimer and if elevated, get v/q scan  Alcohol abuse  Possible cause of patient's current presentation. Patient denies drinking alcohol on a daily basis.  CIWA  Hypertension.  Resume home meds  Hypokalemia/hypomagnesium -replete  Will do home O2 screen If ok and d dimer ok- will d/c home Discussed with cards and EP   Code Status: full Family Communication: patient/wife- not interested in quiting alcohol Disposition Plan:  Home soon   Consultants:  Cards  EP  Procedures:      HPI/Subjective: Not interested in cutting back or quitting alcohol No further arrythmia  Objective: Filed Vitals:   10/30/13 0500  BP: 158/89  Pulse: 58  Temp: 98.4 F (36.9 C)  Resp: 18    Intake/Output Summary (Last 24 hours) at 10/30/13 0949 Last data filed at 10/29/13 1600  Gross per 24 hour  Intake      0 ml  Output    825 ml  Net   -825 ml   Filed Weights   10/29/13 0500 10/30/13 0500  Weight: 79.4 kg (175 lb 0.7 oz) 80.1 kg (176 lb 9.4 oz)    Exam:   General:  A+Ox3, NAD  Cardiovascular: rrr  Respiratory:  clear  Abdomen: +BS, soft  Musculoskeletal: moves all 4 ext   Data Reviewed: Basic Metabolic Panel:  Recent Labs Lab 10/27/13 2038 10/28/13 0535 10/28/13 1155 10/29/13 0422 10/30/13 0357  NA 145 146  --  139 141  K 3.4* 3.7  --  3.2* 3.7  CL 105 108  --  102 103  CO2 26 23  --  23 27  GLUCOSE 98 108*  --  111* 107*  BUN 17 16  --  14 17  CREATININE 1.51* 1.28  --  1.10 1.28  CALCIUM 9.2 9.0  --  9.0 9.5  MG  --   --  1.3*  --   --    Liver Function Tests:  Recent Labs Lab 10/27/13 2038 10/28/13 0535  AST 61* 47*  ALT 51 44  ALKPHOS 110 103  BILITOT 0.3 0.3  PROT 7.0 6.3  ALBUMIN 3.4* 3.1*   No results found for this basename: LIPASE, AMYLASE,  in the last 168 hours No results found for this basename: AMMONIA,  in the last 168 hours CBC:  Recent Labs Lab 10/27/13 2038 10/28/13 0535  WBC 5.0 4.7  NEUTROABS 2.4  --   HGB 11.2* 10.4*  HCT 32.6* 30.6*  MCV 93.7 94.2  PLT 175 149*   Cardiac Enzymes:  Recent Labs Lab 10/28/13 0023 10/28/13 0535 10/28/13 1155  TROPONINI <0.30 <0.30 <0.30   BNP (last  3 results)  Recent Labs  10/27/13 2038  PROBNP 692.6*   CBG:  Recent Labs Lab 10/27/13 2035  GLUCAP 105*    No results found for this or any previous visit (from the past 240 hour(s)).   Studies: No results found.  Scheduled Meds: . amLODipine  5 mg Oral Daily  . aspirin  325 mg Oral Daily  . atorvastatin  20 mg Oral q1800  . carvedilol  25 mg Oral BID WC  . clopidogrel  75 mg Oral Q breakfast  . folic acid  1 mg Oral Daily  . heparin  5,000 Units Subcutaneous 3 times per day  . multivitamin with minerals  1 tablet Oral Daily  . nicotine  7 mg Transdermal Daily  . pantoprazole  40 mg Oral Daily  . ramipril  10 mg Oral BID  . sodium chloride  3 mL Intravenous Q12H  . thiamine  100 mg Oral Daily   Continuous Infusions:   Antibiotics Given (last 72 hours)   None      Principal Problem:   Syncope Active Problems:   Seizure  disorder   Alcohol abuse   HTN (hypertension)   AICD (automatic cardioverter/defibrillator) present   Nonischemic cardiomyopathy    Time spent: 25 min    Joseph Art  [facility] Pager [phone]. If 7PM-7AM, please contact night-coverage at [email or web address], password [facility] 10/30/2013, 9:49 AM  LOS: 3 days

## 2013-10-30 NOTE — Progress Notes (Signed)
SUBJECTIVE: The patient is doing well today.  At this time, he denies chest pain, shortness of breath, or any new concerns.  No further syncope or arrhythmias on telemetry.  Marland Kitchen. amLODipine  5 mg Oral Daily  . aspirin  325 mg Oral Daily  . atorvastatin  20 mg Oral q1800  . carvedilol  25 mg Oral BID WC  . clopidogrel  75 mg Oral Q breakfast  . folic acid  1 mg Oral Daily  . heparin  5,000 Units Subcutaneous 3 times per day  . multivitamin with minerals  1 tablet Oral Daily  . nicotine  7 mg Transdermal Q24H  . pantoprazole  40 mg Oral Daily  . ramipril  10 mg Oral BID  . sodium chloride  3 mL Intravenous Q12H  . thiamine  100 mg Oral Daily      OBJECTIVE: Physical Exam: Filed Vitals:   10/29/13 1420 10/29/13 1835 10/29/13 1900 10/30/13 0500  BP: 155/123 137/105 134/99 158/89  Pulse: 96 72 86 58  Temp: 97.8 F (36.6 C)  97.7 F (36.5 C) 98.4 F (36.9 C)  TempSrc: Oral  Oral   Resp: 20 18 18 18   Height:      Weight:    176 lb 9.4 oz (80.1 kg)  SpO2: 88% 97% 94% 98%    Intake/Output Summary (Last 24 hours) at 10/30/13 1011 Last data filed at 10/29/13 1600  Gross per 24 hour  Intake      0 ml  Output    825 ml  Net   -825 ml    Telemetry reveals sinus rhythm  Poorly kept appearing middle aged man, NAD  HEENT: OP clear Neck: 7 cm JVD  Lungs: clear HEART: Regular rate rhythm, no murmurs, no rubs, no clicks  Abd: soft, positive bowel sounds, no organomegally, no rebound, no guarding  Ext: 2 plus pulses, no edema, no cyanosis, no clubbing  Skin: No rashes no nodules    LABS: Basic Metabolic Panel:  Recent Labs  40/98/1104/09/08 1155 10/29/13 0422 10/30/13 0357  NA  --  139 141  K  --  3.2* 3.7  CL  --  102 103  CO2  --  23 27  GLUCOSE  --  111* 107*  BUN  --  14 17  CREATININE  --  1.10 1.28  CALCIUM  --  9.0 9.5  MG 1.3*  --   --    Liver Function Tests:  Recent Labs  10/27/13 2038 10/28/13 0535  AST 61* 47*  ALT 51 44  ALKPHOS 110 103  BILITOT 0.3  0.3  PROT 7.0 6.3  ALBUMIN 3.4* 3.1*   No results found for this basename: LIPASE, AMYLASE,  in the last 72 hours CBC:  Recent Labs  10/27/13 2038 10/28/13 0535  WBC 5.0 4.7  NEUTROABS 2.4  --   HGB 11.2* 10.4*  HCT 32.6* 30.6*  MCV 93.7 94.2  PLT 175 149*   Cardiac Enzymes:  Recent Labs  10/28/13 0023 10/28/13 0535 10/28/13 1155  TROPONINI <0.30 <0.30 <0.30   BNP: No components found with this basename: POCBNP,  D-Dimer: No results found for this basename: DDIMER,  in the last 72 hours Hemoglobin A1C: No results found for this basename: HGBA1C,  in the last 72 hours Fasting Lipid Panel: No results found for this basename: CHOL, HDL, LDLCALC, TRIG, CHOLHDL, LDLDIRECT,  in the last 72 hours Thyroid Function Tests: No results found for this basename: TSH, T4TOTAL, FREET3, T3FREE, THYROIDAB,  in the last 72 hours Anemia Panel: No results found for this basename: VITAMINB12, FOLATE, FERRITIN, TIBC, IRON, RETICCTPCT,  in the last 72 hours  RADIOLOGY: Dg Chest Port 1 View  10/27/2013   CLINICAL DATA:  Syncope  EXAM: PORTABLE CHEST - 1 VIEW  COMPARISON:  Chest CT and chest radiograph, 10/02/2012.  FINDINGS: Cardiac silhouette is normal in size. Normal mediastinal and hilar contours. Clear lungs. No pleural effusion or pneumothorax. Right anterior chest wall AICD is stable in well positioned. Bony thorax is grossly intact.  IMPRESSION: No acute cardiopulmonary disease.   Electronically Signed   By: Amie Portland M.D.   On: 10/27/2013 20:50    ASSESSMENT AND PLAN:  Principal Problem:   Syncope Active Problems:   Seizure disorder   Alcohol abuse   HTN (hypertension)   AICD (automatic cardioverter/defibrillator) present   Nonischemic cardiomyopathy  1. SVT No further episodes Continue coreg  2. Chronic systolic dysfunction/ nonischemic CM Continue current medicine therapy  3. Syncope Unclear etiology Likely due to ETOH and dehydration I think that PTE is unlikely  but will defer to primary team No driving x 6 months  4. ICD His ICD is approaching but has not reached ERI.  He has a fidelis lead which is normally functioning He requires close follow-up in our office but has previously not been compliant The importance of compliance was discussed with the patient and his spouse He is aware that he has an appointment with Dr Graciela Husbands on 11/10/13 at 3:30pm  OK to discharge today from an EP standpoint. Electrophysiology team to see as needed while here. Please call with questions.     Hillis Range, MD 10/30/2013 10:11 AM

## 2013-11-01 NOTE — Discharge Summary (Addendum)
Physician Discharge Summary  [name] YQI:[id number] DOB: 03/21/1956 DOA: 10/27/2013  PCP: Pola Corn, MD  Admit date: 10/27/2013 Discharge date: 10/30/2013  Time spent: 35 minutes  Recommendations for Outpatient Follow-up:  1. Encourage compliance 2. Stop drinking  Discharge Diagnoses:  Principal Problem:   Syncope Active Problems:   Seizure disorder   Alcohol abuse   HTN (hypertension)   AICD (automatic cardioverter/defibrillator) present   Nonischemic cardiomyopathy   Discharge Condition: improved  Diet recommendation: cardiac  Filed Weights   10/29/13 0500 10/30/13 0500  Weight: 79.4 kg (175 lb 0.7 oz) 80.1 kg (176 lb 9.4 oz)    History of present illness:  [name] Phillips is a 58 y.o. male with Past medical history of Nonischemic cardiomyopathy status post AICD placement, hypertension, CVA, seizure.  The patient was brought in family as he passed out today. Patient mentions during the day he has been feeling tired and lethargy. Around 3 PM he drank significant amount of moonshine and some beer. He was not so she slept. When he woke up he went to restroom for bowel movement and felt that he was getting warm all over and felt pain all over.He also had 3 episodes of vomiting sitting on the commode. When his wife came to the restroom he all of a sudden passed out and And was not responding for few minutes. When he woke up he was still confused and disoriented at which time they called 911. EMS found that patient's blood pressure initially was in 60s which improved 100 when they were transporting him to the hospital.  The patient is coming from home. And at his baseline independent for most of his ADL   Hospital Course:  Syncope with alcohol level of 245  -hypotensive initially but his blood pressure has significantly improved with IV hydration- patient does not drink water at home-just alcohol  -EKG and initial blood work does not show any acute abnormality  Chest x-ray  is clear.  AICD evaluation did not show any acute abnormality as well.  -echocardiogram shows EF of 25% which is slightly better than before where EF was 15-20%  He has a history of seizures in the setting of chronic alcohol abuse and not on any AED.  At present this episode does not appear to be seizure and we will monitor him with serial neuro checks.  -had episode of SVT which resolved- on BB- ? If needs different med- will ask cards to weigh in  -cards suggested to r/o PE:  d dimer normal  Alcohol abuse  Possible cause of patient's current presentation. Patient denies drinking alcohol on a daily basis.  CIWA   Hypertension.  Resume home meds   Hypokalemia/hypomagnesium  -replete    Discussed with cards and EP   Procedures:  echo  Consultations:  EP  cards  Discharge Exam: Filed Vitals:   10/30/13 0500  BP: 158/89  Pulse: 58  Temp: 98.4 F (36.9 C)  Resp: 18    General: A+Ox3, NAD Cardiovascular: rrr Respiratory: clear  Discharge Instructions You were cared for by a hospitalist during your hospital stay. If you have any questions about your discharge medications or the care you received while you were in the hospital after you are discharged, you can call the unit and asked to speak with the hospitalist on call if the hospitalist that took care of you is not available. Once you are discharged, your primary care physician will handle any further medical issues. Please note that  NO REFILLS for any discharge medications will be authorized once you are discharged, as it is imperative that you return to your primary care physician (or establish a relationship with a primary care physician if you do not have one) for your aftercare needs so that they can reassess your need for medications and monitor your lab values.      Discharge Instructions   Diet - low sodium heart healthy    Complete by:  As directed      Discharge instructions    Complete by:  As directed    Be sure to follow up at EP appointment Avoid alcohol Drink plenty of water     Driving Restrictions    Complete by:  As directed   No driving     Increase activity slowly    Complete by:  As directed             Medication List         amLODipine 5 MG tablet  Commonly known as:  NORVASC  Take 1 tablet (5 mg total) by mouth daily.     aspirin 325 MG tablet  Take 325 mg by mouth daily.     atorvastatin 10 MG tablet  Commonly known as:  LIPITOR  Take 20 mg by mouth daily. Take at 6pm     carvedilol 25 MG tablet  Commonly known as:  COREG  Take 1 tablet (25 mg total) by mouth 2 (two) times daily with a meal.     clopidogrel 75 MG tablet  Commonly known as:  PLAVIX  Take 1 tablet (75 mg total) by mouth daily with breakfast.     folic acid 1 MG tablet  Commonly known as:  FOLVITE  Take 1 tablet (1 mg total) by mouth daily.     multivitamin with minerals Tabs tablet  Take 1 tablet by mouth daily.     pantoprazole 40 MG tablet  Commonly known as:  PROTONIX  Take 1 tablet (40 mg total) by mouth daily.     ramipril 10 MG capsule  Commonly known as:  ALTACE  Take 10 mg by mouth 2 (two) times daily.     thiamine 100 MG tablet  Take 1 tablet (100 mg total) by mouth daily.       Allergies  Allergen Reactions  . Codeine Hives  . Penicillins Hives  . Prednisone Hives   Follow-up Information   Follow up with Sherryl Manges, MD On 11/10/2013. (3:30)    Specialty:  Cardiology   Contact information:   [address] [place] [address] [phone]       Follow up with Pola Corn, MD In 1 week.   Specialty:  Cardiology   Contact information:   [address] [place] [address] [phone]        The results of significant diagnostics from this hospitalization (including imaging, microbiology, ancillary and laboratory) are listed below for reference.    Significant Diagnostic Studies: Dg Chest Port 1 View  10/27/2013    CLINICAL DATA:  Syncope  EXAM: PORTABLE CHEST - 1 VIEW  COMPARISON:  Chest CT and chest radiograph, 10/02/2012.  FINDINGS: Cardiac silhouette is normal in size. Normal mediastinal and hilar contours. Clear lungs. No pleural effusion or pneumothorax. Right anterior chest wall AICD is stable in well positioned. Bony thorax is grossly intact.  IMPRESSION: No acute cardiopulmonary disease.   Electronically Signed   By: Amie Portland M.D.   On:  10/27/2013 20:50    Microbiology: No results found for this or any previous visit (from the past 240 hour(s)).   Labs: Basic Metabolic Panel:  Recent Labs Lab 10/27/13 2038 10/28/13 0535 10/28/13 1155 10/29/13 0422 10/30/13 0357  NA 145 146  --  139 141  K 3.4* 3.7  --  3.2* 3.7  CL 105 108  --  102 103  CO2 26 23  --  23 27  GLUCOSE 98 108*  --  111* 107*  BUN 17 16  --  14 17  CREATININE 1.51* 1.28  --  1.10 1.28  CALCIUM 9.2 9.0  --  9.0 9.5  MG  --   --  1.3*  --   --    Liver Function Tests:  Recent Labs Lab 10/27/13 2038 10/28/13 0535  AST 61* 47*  ALT 51 44  ALKPHOS 110 103  BILITOT 0.3 0.3  PROT 7.0 6.3  ALBUMIN 3.4* 3.1*   No results found for this basename: LIPASE, AMYLASE,  in the last 168 hours No results found for this basename: AMMONIA,  in the last 168 hours CBC:  Recent Labs Lab 10/27/13 2038 10/28/13 0535 10/30/13 1050  WBC 5.0 4.7 5.8  NEUTROABS 2.4  --   --   HGB 11.2* 10.4* 11.7*  HCT 32.6* 30.6* 34.7*  MCV 93.7 94.2 93.5  PLT 175 149* 152   Cardiac Enzymes:  Recent Labs Lab 10/28/13 0023 10/28/13 0535 10/28/13 1155  TROPONINI <0.30 <0.30 <0.30   BNP: BNP (last 3 results)  Recent Labs  10/27/13 2038  PROBNP 692.6*   CBG:  Recent Labs Lab 10/27/13 2035  GLUCAP 105*       Signed:  Joseph Art  [facility] 11/01/2013, 1:29 PM

## 2013-11-03 ENCOUNTER — Encounter: Payer: Self-pay | Admitting: *Deleted

## 2013-11-05 NOTE — Telephone Encounter (Signed)
CERTIFIED LETTER SIGNED BY PATIENT 11-01-13/MT

## 2013-11-10 ENCOUNTER — Encounter: Payer: Self-pay | Admitting: Internal Medicine

## 2013-11-10 ENCOUNTER — Ambulatory Visit (INDEPENDENT_AMBULATORY_CARE_PROVIDER_SITE_OTHER): Payer: Medicare Other | Admitting: Internal Medicine

## 2013-11-10 VITALS — BP 140/94 | HR 84 | Resp 16 | Ht 75.0 in | Wt 174.8 lb

## 2013-11-10 DIAGNOSIS — I428 Other cardiomyopathies: Secondary | ICD-10-CM

## 2013-11-10 DIAGNOSIS — I5022 Chronic systolic (congestive) heart failure: Secondary | ICD-10-CM

## 2013-11-10 DIAGNOSIS — Z95810 Presence of automatic (implantable) cardiac defibrillator: Secondary | ICD-10-CM

## 2013-11-10 LAB — MDC_IDC_ENUM_SESS_TYPE_INCLINIC
Battery Voltage: 2.62 V
Brady Statistic RV Percent Paced: 0.16 %
Date Time Interrogation Session: 20150617200524
HighPow Impedance: 41 Ohm
HighPow Impedance: 54 Ohm
Lead Channel Impedance Value: 400 Ohm
Lead Channel Sensing Intrinsic Amplitude: 7.1098
Lead Channel Setting Pacing Amplitude: 2.5 V
Lead Channel Setting Pacing Pulse Width: 0.4 ms
Lead Channel Setting Sensing Sensitivity: 0.3 mV
Zone Setting Detection Interval: 240 ms
Zone Setting Detection Interval: 300 ms
Zone Setting Detection Interval: 330 ms
Zone Setting Detection Interval: 400 ms

## 2013-11-10 NOTE — Patient Instructions (Signed)
Your physician recommends that you follow-up on 8-17 @ 3:30pm with [facility].   Your physician recommends that you schedule a follow-up appointment in: 12 months with Dr.Klein

## 2013-11-10 NOTE — Progress Notes (Signed)
.        Patient Care Team: Pola Corn, MD as PCP - General (Cardiology)   HPI  [name] Phillips is a 58 y.o. male Seen in followup for an ICD implanted years ago in the setting of nonischemic heart disease. His device is approaching ERI. He also has a 6949-lead in Pl.  He was seen in hospital 6/15 because of an episode of syncope. He was admitted to hospital and developed supraventricular tachycardia with adenosine sensitive.  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  Echocardiogram 2011 demonstrated normal left ventricular function and a PFO    Past Medical History  Diagnosis Date  . CVA (cerebral vascular accident)   . Hypertension   . CHF (congestive heart failure)   . Seizure   . Nonischemic cardiomyopathy     Catheterization 2005 normal coronary arteries; EF 15-20%;  2011 EF normal  . Alcohol abuse     Past Surgical History  Procedure Laterality Date  . Cardiac defibrillator placement  2007    MDT [employer] single chamber ICD implanted with 6949 lead by Dr Ty Hilts for primary prevention    Current Outpatient Prescriptions  Medication Sig Dispense Refill  . amLODipine (NORVASC) 5 MG tablet Take 1 tablet (5 mg total) by mouth daily.  30 tablet  0  . aspirin 325 MG tablet Take 325 mg by mouth daily.      Marland Kitchen atorvastatin (LIPITOR) 10 MG tablet Take 20 mg by mouth daily. Take at 6pm      . carvedilol (COREG) 25 MG tablet Take 1 tablet (25 mg total) by mouth 2 (two) times daily with a meal.  60 tablet  0  . clopidogrel (PLAVIX) 75 MG tablet Take 1 tablet (75 mg total) by mouth daily with breakfast.  30 tablet  0  . folic acid (FOLVITE) 1 MG tablet Take 1 tablet (1 mg total) by mouth daily.  30 tablet  0  . Multiple Vitamin (MULTIVITAMIN WITH MINERALS) TABS tablet Take 1 tablet by mouth daily.      . pantoprazole (PROTONIX) 40 MG tablet Take 1 tablet (40 mg total) by mouth daily.  30  tablet  0  . ramipril (ALTACE) 10 MG capsule Take 10 mg by mouth 2 (two) times daily.      Marland Kitchen thiamine 100 MG tablet Take 1 tablet (100 mg total) by mouth daily.  30 tablet  0   No current facility-administered medications for this visit.    Allergies  Allergen Reactions  . Codeine Hives  . Penicillins Hives  . Prednisone Hives    Review of Systems negative except from HPI and PMH  Physical Exam BP 140/94  Pulse 84  Resp 16  Ht 6\' 3"  (1.905 m)  Wt 174 lb 12.8 oz (79.289 kg)  BMI 21.85 kg/m2 Well developed and nourished in no acute distress HENT normal Neck supple with JVP-flat Clear Regular rate and rhythm, no murmurs or gallops Abd-soft with active BS No Clubbing cyanosis edema Skin-warm and dry A & Oriented  Grossly normal sensory and motor function  Sinus rhythm at 73 Intervals 18/09/39 Left ventricular hypertrophy with repolarization abnormalities PACs  Assessment and  Plan  Nonischemic cardiac myopathy  SVT  Syncope unrelated to he arrhythmia  ICD-[employer]  6949-lead Device is reaching ERI Orders were demonstrated. We'll see him again in 2 months.

## 2014-01-02 ENCOUNTER — Emergency Department (HOSPITAL_COMMUNITY): Payer: Medicare Other

## 2014-01-02 ENCOUNTER — Inpatient Hospital Stay (HOSPITAL_COMMUNITY)
Admission: EM | Admit: 2014-01-02 | Discharge: 2014-01-07 | DRG: 101 | Disposition: A | Discharge status: Skilled Nursing Facility | Payer: Medicare Other | Attending: Internal Medicine | Admitting: Internal Medicine

## 2014-01-02 ENCOUNTER — Encounter (HOSPITAL_COMMUNITY): Payer: Self-pay | Admitting: Emergency Medicine

## 2014-01-02 DIAGNOSIS — R680 Hypothermia, not associated with low environmental temperature: Secondary | ICD-10-CM | POA: Diagnosis present

## 2014-01-02 DIAGNOSIS — R4020 Unspecified coma: Secondary | ICD-10-CM | POA: Diagnosis present

## 2014-01-02 DIAGNOSIS — E860 Dehydration: Secondary | ICD-10-CM | POA: Diagnosis present

## 2014-01-02 DIAGNOSIS — F1020 Alcohol dependence, uncomplicated: Secondary | ICD-10-CM | POA: Diagnosis present

## 2014-01-02 DIAGNOSIS — Z888 Allergy status to other drugs, medicaments and biological substances status: Secondary | ICD-10-CM

## 2014-01-02 DIAGNOSIS — E872 Acidosis, unspecified: Secondary | ICD-10-CM | POA: Diagnosis present

## 2014-01-02 DIAGNOSIS — E876 Hypokalemia: Secondary | ICD-10-CM | POA: Diagnosis present

## 2014-01-02 DIAGNOSIS — I10 Essential (primary) hypertension: Secondary | ICD-10-CM | POA: Diagnosis present

## 2014-01-02 DIAGNOSIS — F17200 Nicotine dependence, unspecified, uncomplicated: Secondary | ICD-10-CM | POA: Diagnosis present

## 2014-01-02 DIAGNOSIS — F1010 Alcohol abuse, uncomplicated: Secondary | ICD-10-CM

## 2014-01-02 DIAGNOSIS — G40909 Epilepsy, unspecified, not intractable, without status epilepticus: Secondary | ICD-10-CM | POA: Diagnosis present

## 2014-01-02 DIAGNOSIS — Z8249 Family history of ischemic heart disease and other diseases of the circulatory system: Secondary | ICD-10-CM

## 2014-01-02 DIAGNOSIS — I428 Other cardiomyopathies: Secondary | ICD-10-CM | POA: Diagnosis present

## 2014-01-02 DIAGNOSIS — IMO0002 Reserved for concepts with insufficient information to code with codable children: Secondary | ICD-10-CM | POA: Diagnosis present

## 2014-01-02 DIAGNOSIS — I509 Heart failure, unspecified: Secondary | ICD-10-CM | POA: Diagnosis present

## 2014-01-02 DIAGNOSIS — T68XXXA Hypothermia, initial encounter: Secondary | ICD-10-CM

## 2014-01-02 DIAGNOSIS — R404 Transient alteration of awareness: Secondary | ICD-10-CM | POA: Diagnosis present

## 2014-01-02 DIAGNOSIS — I5020 Unspecified systolic (congestive) heart failure: Secondary | ICD-10-CM | POA: Diagnosis present

## 2014-01-02 DIAGNOSIS — F10239 Alcohol dependence with withdrawal, unspecified: Secondary | ICD-10-CM | POA: Diagnosis present

## 2014-01-02 DIAGNOSIS — F29 Unspecified psychosis not due to a substance or known physiological condition: Secondary | ICD-10-CM | POA: Diagnosis present

## 2014-01-02 DIAGNOSIS — Z880 Allergy status to penicillin: Secondary | ICD-10-CM

## 2014-01-02 DIAGNOSIS — R569 Unspecified convulsions: Secondary | ICD-10-CM | POA: Diagnosis present

## 2014-01-02 DIAGNOSIS — F10939 Alcohol use, unspecified with withdrawal, unspecified: Secondary | ICD-10-CM | POA: Diagnosis present

## 2014-01-02 DIAGNOSIS — Z95810 Presence of automatic (implantable) cardiac defibrillator: Secondary | ICD-10-CM | POA: Diagnosis present

## 2014-01-02 DIAGNOSIS — F10929 Alcohol use, unspecified with intoxication, unspecified: Secondary | ICD-10-CM

## 2014-01-02 DIAGNOSIS — N289 Disorder of kidney and ureter, unspecified: Secondary | ICD-10-CM | POA: Diagnosis present

## 2014-01-02 DIAGNOSIS — Z7982 Long term (current) use of aspirin: Secondary | ICD-10-CM

## 2014-01-02 DIAGNOSIS — Z7902 Long term (current) use of antithrombotics/antiplatelets: Secondary | ICD-10-CM

## 2014-01-02 DIAGNOSIS — R55 Syncope and collapse: Secondary | ICD-10-CM

## 2014-01-02 DIAGNOSIS — D696 Thrombocytopenia, unspecified: Secondary | ICD-10-CM | POA: Diagnosis present

## 2014-01-02 DIAGNOSIS — F10231 Alcohol dependence with withdrawal delirium: Secondary | ICD-10-CM | POA: Diagnosis present

## 2014-01-02 DIAGNOSIS — I959 Hypotension, unspecified: Secondary | ICD-10-CM | POA: Diagnosis present

## 2014-01-02 DIAGNOSIS — E162 Hypoglycemia, unspecified: Secondary | ICD-10-CM | POA: Diagnosis present

## 2014-01-02 DIAGNOSIS — Z720 Tobacco use: Secondary | ICD-10-CM | POA: Diagnosis present

## 2014-01-02 DIAGNOSIS — Z8673 Personal history of transient ischemic attack (TIA), and cerebral infarction without residual deficits: Secondary | ICD-10-CM | POA: Diagnosis not present

## 2014-01-02 DIAGNOSIS — Z885 Allergy status to narcotic agent status: Secondary | ICD-10-CM

## 2014-01-02 DIAGNOSIS — F10931 Alcohol use, unspecified with withdrawal delirium: Secondary | ICD-10-CM | POA: Diagnosis present

## 2014-01-02 DIAGNOSIS — K146 Glossodynia: Secondary | ICD-10-CM | POA: Diagnosis present

## 2014-01-02 DIAGNOSIS — F10920 Alcohol use, unspecified with intoxication, uncomplicated: Secondary | ICD-10-CM

## 2014-01-02 LAB — COMPREHENSIVE METABOLIC PANEL
ALT: 83 U/L — ABNORMAL HIGH (ref 0–53)
AST: 108 U/L — ABNORMAL HIGH (ref 0–37)
Albumin: 3.5 g/dL (ref 3.5–5.2)
Alkaline Phosphatase: 109 U/L (ref 39–117)
Anion gap: 18 — ABNORMAL HIGH (ref 5–15)
BUN: 10 mg/dL (ref 6–23)
CO2: 23 mEq/L (ref 19–32)
Calcium: 8.7 mg/dL (ref 8.4–10.5)
Chloride: 105 mEq/L (ref 96–112)
Creatinine, Ser: 1.45 mg/dL — ABNORMAL HIGH (ref 0.50–1.35)
GFR calc Af Amer: 59 mL/min — ABNORMAL LOW (ref >90)
GFR calc non Af Amer: 51 mL/min — ABNORMAL LOW (ref >90)
Glucose, Bld: 106 mg/dL — ABNORMAL HIGH (ref 70–99)
Potassium: 3.8 mEq/L (ref 3.7–5.3)
Sodium: 146 mEq/L (ref 137–147)
Total Bilirubin: 0.4 mg/dL (ref 0.3–1.2)
Total Protein: 6.6 g/dL (ref 6.0–8.3)

## 2014-01-02 LAB — CBC WITH DIFFERENTIAL/PLATELET
Basophils Absolute: 0 10*3/uL (ref 0.0–0.1)
Basophils Relative: 0 % (ref 0–1)
Eosinophils Absolute: 0.2 10*3/uL (ref 0.0–0.7)
Eosinophils Relative: 3 % (ref 0–5)
HCT: 32.3 % — ABNORMAL LOW (ref 39.0–52.0)
Hemoglobin: 10.8 g/dL — ABNORMAL LOW (ref 13.0–17.0)
Lymphocytes Relative: 30 % (ref 12–46)
Lymphs Abs: 1.7 10*3/uL (ref 0.7–4.0)
MCH: 31.2 pg (ref 26.0–34.0)
MCHC: 33.4 g/dL (ref 30.0–36.0)
MCV: 93.4 fL (ref 78.0–100.0)
Monocytes Absolute: 0.4 10*3/uL (ref 0.1–1.0)
Monocytes Relative: 7 % (ref 3–12)
Neutro Abs: 3.4 10*3/uL (ref 1.7–7.7)
Neutrophils Relative %: 60 % (ref 43–77)
Platelets: 126 10*3/uL — ABNORMAL LOW (ref 150–400)
RBC: 3.46 MIL/uL — ABNORMAL LOW (ref 4.22–5.81)
RDW: 16.1 % — ABNORMAL HIGH (ref 11.5–15.5)
WBC: 5.7 10*3/uL (ref 4.0–10.5)

## 2014-01-02 LAB — URINALYSIS, ROUTINE W REFLEX MICROSCOPIC
Glucose, UA: NEGATIVE mg/dL
Ketones, ur: 15 mg/dL — AB
Nitrite: NEGATIVE
Protein, ur: 100 mg/dL — AB
Specific Gravity, Urine: 1.017 (ref 1.005–1.030)
Urobilinogen, UA: 1 mg/dL (ref 0.0–1.0)
pH: 6 (ref 5.0–8.0)

## 2014-01-02 LAB — URINE MICROSCOPIC-ADD ON

## 2014-01-02 LAB — ETHANOL: Alcohol, Ethyl (B): 320 mg/dL — ABNORMAL HIGH (ref 0–11)

## 2014-01-02 LAB — TROPONIN I: Troponin I: <0.3 ng/mL (ref <0.30)

## 2014-01-02 LAB — I-STAT CG4 LACTIC ACID, ED: Lactic Acid, Venous: 3.6 mmol/L — ABNORMAL HIGH (ref 0.5–2.2)

## 2014-01-02 MED ORDER — ATORVASTATIN CALCIUM 20 MG PO TABS
20.0000 mg | ORAL_TABLET | Freq: Every day | ORAL | Status: DC
  Administered 2014-01-03 – 2014-01-06 (×4): 20 mg via ORAL
  Filled 2014-01-02 (×6): qty 1

## 2014-01-02 MED ORDER — GLUCAGON HCL RDNA (DIAGNOSTIC) 1 MG IJ SOLR: 0.5000 mg | Freq: Once | INTRAMUSCULAR | Status: DC | PRN

## 2014-01-02 MED ORDER — DEXTROSE 50 % IV SOLN: 50.0000 mL | Freq: Once | INTRAVENOUS | Status: DC | PRN

## 2014-01-02 MED ORDER — SODIUM CHLORIDE 0.9 % IJ SOLN
3.0000 mL | Freq: Two times a day (BID) | INTRAMUSCULAR | Status: DC
  Administered 2014-01-02 – 2014-01-07 (×10): 3 mL via INTRAVENOUS

## 2014-01-02 MED ORDER — DEXTROSE 5 % IV SOLN: INTRAVENOUS | Status: DC

## 2014-01-02 MED ORDER — ENOXAPARIN SODIUM 40 MG/0.4ML ~~LOC~~ SOLN
40.0000 mg | Freq: Every day | SUBCUTANEOUS | Status: DC
  Administered 2014-01-02 – 2014-01-06 (×5): 40 mg via SUBCUTANEOUS
  Filled 2014-01-02 (×6): qty 0.4

## 2014-01-02 MED ORDER — THIAMINE HCL 100 MG/ML IJ SOLN
Freq: Once | INTRAVENOUS | Status: AC
  Administered 2014-01-02: 11:00:00 via INTRAVENOUS
  Filled 2014-01-02: qty 1000

## 2014-01-02 MED ORDER — HYDRALAZINE HCL 20 MG/ML IJ SOLN
5.0000 mg | Freq: Four times a day (QID) | INTRAMUSCULAR | Status: DC | PRN
  Administered 2014-01-02 – 2014-01-03 (×2): 5 mg via INTRAVENOUS
  Filled 2014-01-02 (×2): qty 1

## 2014-01-02 MED ORDER — GLUCAGON HCL RDNA (DIAGNOSTIC) 1 MG IJ SOLR: 1.0000 mg | Freq: Once | INTRAMUSCULAR | Status: AC | PRN

## 2014-01-02 MED ORDER — GLUCOSE 40 % PO GEL: 1.0000 | Freq: Once | ORAL | Status: AC | PRN

## 2014-01-02 MED ORDER — DEXTROSE 50 % IV SOLN: 25.0000 mL | Freq: Once | INTRAVENOUS | Status: AC | PRN

## 2014-01-02 MED ORDER — PNEUMOCOCCAL VAC POLYVALENT 25 MCG/0.5ML IJ INJ
0.5000 mL | INJECTION | INTRAMUSCULAR | Status: AC
  Administered 2014-01-04: 0.5 mL via INTRAMUSCULAR
  Filled 2014-01-02 (×2): qty 0.5

## 2014-01-02 MED ORDER — GLUCAGON HCL RDNA (DIAGNOSTIC) 1 MG IJ SOLR: 1.0000 mg | Freq: Once | INTRAMUSCULAR | Status: DC | PRN

## 2014-01-02 MED ORDER — SODIUM CHLORIDE 0.9 % IV BOLUS (SEPSIS)
250.0000 mL | Freq: Once | INTRAVENOUS | Status: AC
  Administered 2014-01-02: 250 mL via INTRAVENOUS

## 2014-01-02 MED ORDER — ONDANSETRON HCL 4 MG/2ML IJ SOLN: 4.0000 mg | Freq: Four times a day (QID) | INTRAMUSCULAR | Status: DC | PRN

## 2014-01-02 MED ORDER — ASPIRIN 325 MG PO TABS
325.0000 mg | ORAL_TABLET | Freq: Every day | ORAL | Status: DC
  Administered 2014-01-02 – 2014-01-07 (×6): 325 mg via ORAL
  Filled 2014-01-02 (×6): qty 1

## 2014-01-02 MED ORDER — DEXTROSE 50 % IV SOLN: 50.0000 mL | Freq: Once | INTRAVENOUS | Status: AC | PRN

## 2014-01-02 MED ORDER — ADULT MULTIVITAMIN W/MINERALS CH
1.0000 | ORAL_TABLET | Freq: Every day | ORAL | Status: DC
  Administered 2014-01-02 – 2014-01-07 (×6): 1 via ORAL
  Filled 2014-01-02 (×6): qty 1

## 2014-01-02 MED ORDER — GLUCAGON HCL RDNA (DIAGNOSTIC) 1 MG IJ SOLR: 0.5000 mg | Freq: Once | INTRAMUSCULAR | Status: AC | PRN

## 2014-01-02 MED ORDER — LORAZEPAM 1 MG PO TABS
0.0000 mg | ORAL_TABLET | Freq: Two times a day (BID) | ORAL | Status: DC
  Administered 2014-01-05: 1 mg via ORAL
  Filled 2014-01-02: qty 1

## 2014-01-02 MED ORDER — PANTOPRAZOLE SODIUM 40 MG PO TBEC
40.0000 mg | DELAYED_RELEASE_TABLET | Freq: Every day | ORAL | Status: DC
  Administered 2014-01-02 – 2014-01-07 (×6): 40 mg via ORAL
  Filled 2014-01-02 (×4): qty 1

## 2014-01-02 MED ORDER — ONDANSETRON HCL 4 MG PO TABS: 4.0000 mg | ORAL_TABLET | Freq: Four times a day (QID) | ORAL | Status: DC | PRN

## 2014-01-02 MED ORDER — CLOPIDOGREL BISULFATE 75 MG PO TABS
75.0000 mg | ORAL_TABLET | Freq: Every day | ORAL | Status: DC
  Administered 2014-01-02 – 2014-01-07 (×6): 75 mg via ORAL
  Filled 2014-01-02 (×4): qty 1

## 2014-01-02 MED ORDER — DEXTROSE 50 % IV SOLN
25.0000 mL | Freq: Once | INTRAVENOUS | Status: DC | PRN
Start: 2014-01-02 — End: 2014-01-02

## 2014-01-02 MED ORDER — LORAZEPAM 1 MG PO TABS
0.0000 mg | ORAL_TABLET | Freq: Four times a day (QID) | ORAL | Status: AC
  Administered 2014-01-02: 2 mg via ORAL
  Administered 2014-01-03 (×2): 4 mg via ORAL
  Filled 2014-01-02: qty 2
  Filled 2014-01-02 (×2): qty 4

## 2014-01-02 NOTE — ED Notes (Signed)
med[employer]onic aicd interrogated

## 2014-01-02 NOTE — Consult Note (Addendum)
Reason for Consult: Recurrent generalized seizure.  HPI:                                                                                                                                          [name] Phillips is an 58 y.o. male history of stroke, hypertension, congestive heart failure, seizure disorder and alcohol abuse, brought to the emergency room following a witnessed generalized seizure. Patient was found on the floor exhibiting generalized seizure activity. No further seizure activity has occurred. Alcohol level was 320. AST and ALT were elevated. CT scan of his head showed no acute intracranial abnormality. Patient has previously taken Dilantin for seizure control, but is currently on no anticonvulsant medication. Patient was noted to be hypotensive on presentation with systolic blood pressure in the 80s. IV loading with Dilantin was deferred.  Past Medical History  Diagnosis Date  . CVA (cerebral vascular accident)   . Hypertension   . CHF (congestive heart failure)   . Seizure   . Nonischemic cardiomyopathy     Catheterization 2005 normal coronary arteries; EF 15-20%;  2011 EF normal  . Alcohol abuse     Past Surgical History  Procedure Laterality Date  . Cardiac defibrillator placement  2007    MDT [employer] single chamber ICD implanted with 6949 lead by Dr Ilda Foil for primary prevention    Family History  Problem Relation Age of Onset  . Heart disease Father   . Hypertension      Social History:  reports that he has been smoking Cigarettes.  He has a 19.5 pack-year smoking history. He does not have any smokeless tobacco history on file. He reports that he drinks alcohol. He reports that he does not use illicit drugs.  Allergies  Allergen Reactions  . Codeine Hives  . Penicillins Hives  . Prednisone Hives    MEDICATIONS:                                                                                                                     I have reviewed the patient's  current medications.   ROS:  History obtained from unobtainable from patient due to mental status   Blood pressure 107/75, pulse 84, resp. rate 12, SpO2 96.00%.   Neurologic Examination:                                                                                                      Mental Status: Lethargic, oriented x3, and in no acute distress.  Speech slightly slurred without evidence of aphasia. Able to follow commands without difficulty. Cranial Nerves: II-Visual fields were normal. III/IV/VI-Pupils were equal and reacted. Extraocular movements were full and conjugate.    V/VII-no facial numbness and no facial weakness. VIII-normal. X-mild dysarthria, commensurate with level of alertness and alcohol intoxication. Motor: 5/5 bilaterally with normal tone and bulk Sensory: Normal throughout. Deep Tendon Reflexes: 1+ and symmetric. Plantars: Flexor bilaterally Cerebellar: Normal finger-to-nose testing.  Lab Results  Component Value Date/Time   CHOL 249* 08/09/2011  5:00 AM    Results for orders placed during the hospital encounter of 01/02/14 (from the past 48 hour(s))  CBC WITH DIFFERENTIAL     Status: Abnormal   Collection Time    01/02/14  4:12 AM      Result Value Ref Range   WBC 5.7  4.0 - 10.5 K/uL   RBC 3.46 (*) 4.22 - 5.81 MIL/uL   Hemoglobin 10.8 (*) 13.0 - 17.0 g/dL   HCT 32.3 (*) 39.0 - 52.0 %   MCV 93.4  78.0 - 100.0 fL   MCH 31.2  26.0 - 34.0 pg   MCHC 33.4  30.0 - 36.0 g/dL   RDW 16.1 (*) 11.5 - 15.5 %   Platelets 126 (*) 150 - 400 K/uL   Neutrophils Relative % 60  43 - 77 %   Neutro Abs 3.4  1.7 - 7.7 K/uL   Lymphocytes Relative 30  12 - 46 %   Lymphs Abs 1.7  0.7 - 4.0 K/uL   Monocytes Relative 7  3 - 12 %   Monocytes Absolute 0.4  0.1 - 1.0 K/uL   Eosinophils Relative 3  0 - 5 %   Eosinophils Absolute 0.2  0.0 -  0.7 K/uL   Basophils Relative 0  0 - 1 %   Basophils Absolute 0.0  0.0 - 0.1 K/uL  TROPONIN I     Status: None   Collection Time    01/02/14  4:12 AM      Result Value Ref Range   Troponin I <0.30  <0.30 ng/mL   Comment:            Due to the release kinetics of cTnI,     a negative result within the first hours     of the onset of symptoms does not rule out     myocardial infarction with certainty.     If myocardial infarction is still suspected,     repeat the test at appropriate intervals.  ETHANOL     Status: Abnormal   Collection Time    01/02/14  4:12 AM      Result Value Ref Range   Alcohol,  Ethyl (B) 320 (*) 0 - 11 mg/dL   Comment:            LOWEST DETECTABLE LIMIT FOR     SERUM ALCOHOL IS 11 mg/dL     FOR MEDICAL PURPOSES ONLY  COMPREHENSIVE METABOLIC PANEL     Status: Abnormal   Collection Time    01/02/14  4:12 AM      Result Value Ref Range   Sodium 146  137 - 147 mEq/L   Potassium 3.8  3.7 - 5.3 mEq/L   Chloride 105  96 - 112 mEq/L   CO2 23  19 - 32 mEq/L   Glucose, Bld 106 (*) 70 - 99 mg/dL   BUN 10  6 - 23 mg/dL   Creatinine, Ser 1.45 (*) 0.50 - 1.35 mg/dL   Calcium 8.7  8.4 - 10.5 mg/dL   Total Protein 6.6  6.0 - 8.3 g/dL   Albumin 3.5  3.5 - 5.2 g/dL   AST 108 (*) 0 - 37 U/L   ALT 83 (*) 0 - 53 U/L   Alkaline Phosphatase 109  39 - 117 U/L   Total Bilirubin 0.4  0.3 - 1.2 mg/dL   GFR calc non Af Amer 51 (*) >90 mL/min   GFR calc Af Amer 59 (*) >90 mL/min   Comment: (NOTE)     The eGFR has been calculated using the CKD EPI equation.     This calculation has not been validated in all clinical situations.     eGFR's persistently <90 mL/min signify possible Chronic Kidney     Disease.   Anion gap 18 (*) 5 - 15  I-STAT CG4 LACTIC ACID, ED     Status: Abnormal   Collection Time    01/02/14  5:37 AM      Result Value Ref Range   Lactic Acid, Venous 3.60 (*) 0.5 - 2.2 mmol/L    No results found.   Assessment/Plan: 58 year old man with history of  previous stroke as well as seizure disorder and alcohol abuse presenting with recurrent generalized seizure alcohol intoxication. Patient has not been taking anticonvulsant medication for reasons that are not entirely clear.  Recommendations: 1. Restart Dilantin at 100 mg 3 times a day. Dilantin loading deferred because of hypotension. 2. Ativan IV for recurrent seizure activity acutely as well as loading dose of Keppra 1000 mg IV followed by 500 mg twice a day until Dilantin level was therapeutic, then discontinue. 3. Parenteral thiamine 100 mg per day, along with multivitamins daily. 4. Alcohol withdrawal precautions and management protocol.  We will continue to follow this patient with you.  C.R. Nicole Kindred, MD [facility] [phone]  01/02/2014, 6:01 AM

## 2014-01-02 NOTE — ED Notes (Signed)
C-collar remains on patient.  

## 2014-01-02 NOTE — Progress Notes (Signed)
58 year old gentleman admitted earlier today, come in for altered mental status and possible seizure like activity. He is currently oriented to place , persona nd time , he denies any new complaints. He is admitted to telemetry.   Kathlen Mody, , MD  [phone]

## 2014-01-02 NOTE — ED Notes (Signed)
Pt has [employer] defibrillator

## 2014-01-02 NOTE — ED Notes (Signed)
In and out cath done to obtain urine.  Patient tolerated procedure well

## 2014-01-02 NOTE — H&P (Signed)
[name] Phillips is an 58 y.o. male.   Chief Complaint: Syncopal episode or seizure. Decreased level of consciousness. HPI: Pt is a 58 yr old male who drinks quite a bit as a matter of course according to his wife. Last evening the patient was at home drinking and the wife was at the hospital tending to her daughter who had delivered their grandchild. At some point the patient's brother in law went by to check on him and found him passed out on the floor of the bathroom.  EMS was called. Once the patient was aroused, he was confused. Upon arrival at [facility] ED, the patient was found to be hypothermic, confused, and hypotensive. His blood pressure responded to IV fluid bolus initially, but now has gone back down to the nineties. He remains very somnolent and is unable to assist with ROS or history, but he does follow some commands.  The patient has previously been found to have suffered as many as 3 seizures on the same day. He was placed on seizure medication, but according to his wife it was stopped.  The patient was in the hospital with a similar episode 2 weeks ago. At that time it was thought that he had had a seizure.  Past Medical History  Diagnosis Date  . CVA (cerebral vascular accident)   . Hypertension   . CHF (congestive heart failure)   . Seizure   . Nonischemic cardiomyopathy     Catheterization 2005 normal coronary arteries; EF 15-20%;  2011 EF normal  . Alcohol abuse     Past Surgical History  Procedure Laterality Date  . Cardiac defibrillator placement  2007    MDT [employer] single chamber ICD implanted with 6949 lead by Dr Ilda Foil for primary prevention    Family History  Problem Relation Age of Onset  . Heart disease Father   . Hypertension     Social History:  reports that he has been smoking Cigarettes.  He has a 19.5 pack-year smoking history. He does not have any smokeless tobacco history on file. He reports that he drinks alcohol. He reports that he does not use  illicit drugs.  Allergies:  Allergies  Allergen Reactions  . Codeine Hives  . Penicillins Hives  . Prednisone Hives     (Not in a hospital admission)  Results for orders placed during the hospital encounter of 01/02/14 (from the past 48 hour(s))  CBC WITH DIFFERENTIAL     Status: Abnormal   Collection Time    01/02/14  4:12 AM      Result Value Ref Range   WBC 5.7  4.0 - 10.5 K/uL   RBC 3.46 (*) 4.22 - 5.81 MIL/uL   Hemoglobin 10.8 (*) 13.0 - 17.0 g/dL   HCT 32.3 (*) 39.0 - 52.0 %   MCV 93.4  78.0 - 100.0 fL   MCH 31.2  26.0 - 34.0 pg   MCHC 33.4  30.0 - 36.0 g/dL   RDW 16.1 (*) 11.5 - 15.5 %   Platelets 126 (*) 150 - 400 K/uL   Neutrophils Relative % 60  43 - 77 %   Neutro Abs 3.4  1.7 - 7.7 K/uL   Lymphocytes Relative 30  12 - 46 %   Lymphs Abs 1.7  0.7 - 4.0 K/uL   Monocytes Relative 7  3 - 12 %   Monocytes Absolute 0.4  0.1 - 1.0 K/uL   Eosinophils Relative 3  0 - 5 %   Eosinophils  Absolute 0.2  0.0 - 0.7 K/uL   Basophils Relative 0  0 - 1 %   Basophils Absolute 0.0  0.0 - 0.1 K/uL  TROPONIN I     Status: None   Collection Time    01/02/14  4:12 AM      Result Value Ref Range   Troponin I <0.30  <0.30 ng/mL   Comment:            Due to the release kinetics of cTnI,     a negative result within the first hours     of the onset of symptoms does not rule out     myocardial infarction with certainty.     If myocardial infarction is still suspected,     repeat the test at appropriate intervals.  ETHANOL     Status: Abnormal   Collection Time    01/02/14  4:12 AM      Result Value Ref Range   Alcohol, Ethyl (B) 320 (*) 0 - 11 mg/dL   Comment:            LOWEST DETECTABLE LIMIT FOR     SERUM ALCOHOL IS 11 mg/dL     FOR MEDICAL PURPOSES ONLY  COMPREHENSIVE METABOLIC PANEL     Status: Abnormal   Collection Time    01/02/14  4:12 AM      Result Value Ref Range   Sodium 146  137 - 147 mEq/L   Potassium 3.8  3.7 - 5.3 mEq/L   Chloride 105  96 - 112 mEq/L   CO2  23  19 - 32 mEq/L   Glucose, Bld 106 (*) 70 - 99 mg/dL   BUN 10  6 - 23 mg/dL   Creatinine, Ser 1.45 (*) 0.50 - 1.35 mg/dL   Calcium 8.7  8.4 - 10.5 mg/dL   Total Protein 6.6  6.0 - 8.3 g/dL   Albumin 3.5  3.5 - 5.2 g/dL   AST 108 (*) 0 - 37 U/L   ALT 83 (*) 0 - 53 U/L   Alkaline Phosphatase 109  39 - 117 U/L   Total Bilirubin 0.4  0.3 - 1.2 mg/dL   GFR calc non Af Amer 51 (*) >90 mL/min   GFR calc Af Amer 59 (*) >90 mL/min   Comment: (NOTE)     The eGFR has been calculated using the CKD EPI equation.     This calculation has not been validated in all clinical situations.     eGFR's persistently <90 mL/min signify possible Chronic Kidney     Disease.   Anion gap 18 (*) 5 - 15  I-STAT CG4 LACTIC ACID, ED     Status: Abnormal   Collection Time    01/02/14  5:37 AM      Result Value Ref Range   Lactic Acid, Venous 3.60 (*) 0.5 - 2.2 mmol/L   Ct Head Wo Contrast  01/02/2014   CLINICAL DATA:  Found on floor; seizure-like activity. Concern for head or cervical spine injury.  EXAM: CT HEAD WITHOUT CONTRAST  CT CERVICAL SPINE WITHOUT CONTRAST  TECHNIQUE: Multidetector CT imaging of the head and cervical spine was performed following the standard protocol without intravenous contrast. Multiplanar CT image reconstructions of the cervical spine were also generated.  COMPARISON:  CT of the head performed 08/08/2011, and CTA of the head and neck performed 12/03/2010  FINDINGS: CT HEAD FINDINGS  There is no evidence of acute infarction, mass lesion, or intra- or extra-axial  hemorrhage on CT.  Prominence of the ventricles and sulci reflects mild cortical volume loss. Chronic encephalomalacia is noted at the left occipital lobe, reflecting remote infarct. Diffuse periventricular and subcortical white matter change likely reflects small vessel ischemic microangiopathy. Small chronic lacunar infarcts are seen within the basal ganglia bilaterally.  The brainstem and fourth ventricle are within normal limits.  No mass effect or midline shift is seen.  There is no evidence of fracture; visualized osseous structures are unremarkable in appearance. The visualized portions of the orbits are within normal limits. The paranasal sinuses and mastoid air cells are well-aerated. No significant soft tissue abnormalities are seen.  CT CERVICAL SPINE FINDINGS  There is no evidence of fracture or subluxation. Vertebral bodies demonstrate normal height and alignment. Large anterior osteophytes are seen along the cervical spine. Intervertebral disc spaces are preserved. Prevertebral soft tissues are within normal limits. The visualized neural foramina are grossly unremarkable.  The thyroid gland is unremarkable in appearance. The visualized lung apices are clear. Calcification is noted at the carotid bifurcations bilaterally.  IMPRESSION: 1. No evidence of traumatic intracranial injury or fracture. 2. No evidence of fracture or subluxation along the cervical spine. 3. Large anterior osteophytes seen along the cervical spine. 4. Mild cortical volume loss and diffuse small vessel ischemic microangiopathy. 5. Chronic encephalomalacia at the left occipital lobe, reflecting remote infarct. Small chronic lacunar infarcts within the basal ganglia bilaterally.   Electronically Signed   By: Garald Balding M.D.   On: 01/02/2014 06:00   Ct Cervical Spine Wo Contrast  01/02/2014   CLINICAL DATA:  Found on floor; seizure-like activity. Concern for head or cervical spine injury.  EXAM: CT HEAD WITHOUT CONTRAST  CT CERVICAL SPINE WITHOUT CONTRAST  TECHNIQUE: Multidetector CT imaging of the head and cervical spine was performed following the standard protocol without intravenous contrast. Multiplanar CT image reconstructions of the cervical spine were also generated.  COMPARISON:  CT of the head performed 08/08/2011, and CTA of the head and neck performed 12/03/2010  FINDINGS: CT HEAD FINDINGS  There is no evidence of acute infarction, mass lesion, or  intra- or extra-axial hemorrhage on CT.  Prominence of the ventricles and sulci reflects mild cortical volume loss. Chronic encephalomalacia is noted at the left occipital lobe, reflecting remote infarct. Diffuse periventricular and subcortical white matter change likely reflects small vessel ischemic microangiopathy. Small chronic lacunar infarcts are seen within the basal ganglia bilaterally.  The brainstem and fourth ventricle are within normal limits. No mass effect or midline shift is seen.  There is no evidence of fracture; visualized osseous structures are unremarkable in appearance. The visualized portions of the orbits are within normal limits. The paranasal sinuses and mastoid air cells are well-aerated. No significant soft tissue abnormalities are seen.  CT CERVICAL SPINE FINDINGS  There is no evidence of fracture or subluxation. Vertebral bodies demonstrate normal height and alignment. Large anterior osteophytes are seen along the cervical spine. Intervertebral disc spaces are preserved. Prevertebral soft tissues are within normal limits. The visualized neural foramina are grossly unremarkable.  The thyroid gland is unremarkable in appearance. The visualized lung apices are clear. Calcification is noted at the carotid bifurcations bilaterally.  IMPRESSION: 1. No evidence of traumatic intracranial injury or fracture. 2. No evidence of fracture or subluxation along the cervical spine. 3. Large anterior osteophytes seen along the cervical spine. 4. Mild cortical volume loss and diffuse small vessel ischemic microangiopathy. 5. Chronic encephalomalacia at the left occipital lobe, reflecting remote infarct. Small  chronic lacunar infarcts within the basal ganglia bilaterally.   Electronically Signed   By: Garald Balding M.D.   On: 01/02/2014 06:00    Review of Systems  Unable to perform ROS: medical condition    Blood pressure 109/70, pulse 83, temperature 95.4 F (35.2 C), temperature source Rectal,  resp. rate 15, SpO2 97.00%. Physical Exam  Constitutional: He appears well-developed and well-nourished.  Pt is lethargic, confused and is unable to assist with history. He does follow some commands.  HENT:  Head: Normocephalic and atraumatic.  Mouth/Throat: Oropharynx is clear and moist. No oropharyngeal exudate.  Eyes: Conjunctivae are normal. Pupils are equal, round, and reactive to light. No scleral icterus.  Pt is unable to cooperate with EOM exam.  Neck: No JVD present. No tracheal deviation present. No thyromegaly present.  Cardiovascular: Normal rate, regular rhythm, normal heart sounds and intact distal pulses.  Exam reveals no gallop and no friction rub.   No murmur heard. Respiratory: Breath sounds normal. No respiratory distress. He has no wheezes. He has no rales. He exhibits no tenderness.  GI: Soft. Bowel sounds are normal. He exhibits no distension and no mass. There is no tenderness. There is no rebound and no guarding.  Musculoskeletal: He exhibits no edema and no tenderness.  Lymphadenopathy:    He has no cervical adenopathy.  Neurological:  Pt is confused and somnolent. He is unable to cooperate with exam.  Skin: Skin is warm and dry. No rash noted. No erythema. No pallor.  Psychiatric:  Pt is confused and somnolent. He is unable to cooperate with exam.     Assessment/Plan 1. Syncopal episode, or possible seizure. DDx.:1) ETOh CMO.2) ETOH as a cause of a lowered seizure threshhold.3) seizure as a result of seizure focus as a result of prior CVA 4) seizure due to hypoglycemia - Pt was hypothermic and hypotensive upon discovery. 2. Hypothermia - warming blanket. Pt's wife states that when he drinks, the patient does not eat. The patient's hypothermia upon arrival raises concern that this patient may have been hypoglycemic due to poor PO intake other than alcohol. He will be on a hypoglycemia protocol 3. Hypotension - UA and blood cultures are drawn. CXR shows no source of  infection. Possible due to hypoglycemic episode. Pt will receive IV fluid resuscitation. Pt 's blood pressure medications will be held until blood pressure warrants their administration. He will be admitted to a telemetry bed. 4. ETOH intoxication - Level of 320 upon arrival. Pt will receive IV fluids with thiamine and folate.  5.ETOH dependence - CIWA protocol 6. Seizure - quite possible the cause of patient's presentation - Dr. Nicole Kindred will consult. Due to either ETOH or hypoglycemia 7. Dehydtation - IV fluids 8. Acute renal insufficiency. - I V fluids and monitor volume status carefully  I have spent 64 minutes on the admission and care of this patient.  Tiphany Fayson 01/02/2014, 6:32 AM

## 2014-01-02 NOTE — ED Notes (Signed)
Pt reports that PCP took him off his seizure medicine, but then stated that he didn't have money to fill his seizure meds.

## 2014-01-02 NOTE — ED Provider Notes (Addendum)
CSN: [id number]     Arrival date & time 01/02/14  0330 History   First MD Initiated Contact with Patient 01/02/14 [phone]     Chief Complaint  Patient presents with  . Fall     (Consider location/radiation/quality/duration/timing/severity/associated sxs/prior Treatment) HPI  This is a 58 year old male with a history of CVA, hypertension, CHF (EF 20-25%), seizure who presents with an episode of loss of consciousness. Per EMS, patient was in the bathroom. He was found by family member with a door knocked down. Family member witnessed seizure-like activity which included shaking of his extremities. Also noted to be incontinent of stool. Upon EMS arrival, patient was confused. He does not remember the events.  History of chronic alcohol abuse.  He is not currently on any antiepileptics.  When asked why he is here, patient states "I recken I  passed out."  He denies any recent fevers, cough, shortness of breath, chest pain.   In general, patient and his wife are both poor historians. Patient's family member who found him on the floor is not here to provide history.  Past Medical History  Diagnosis Date  . CVA (cerebral vascular accident)   . Hypertension   . CHF (congestive heart failure)   . Seizure   . Nonischemic cardiomyopathy     Catheterization 2005 normal coronary arteries; EF 15-20%;  2011 EF normal  . Alcohol abuse    Past Surgical History  Procedure Laterality Date  . Cardiac defibrillator placement  2007    MDT [employer] single chamber ICD implanted with 6949 lead by Dr Ty Hilts for primary prevention   Family History  Problem Relation Age of Onset  . Heart disease Father   . Hypertension     History  Substance Use Topics  . Smoking status: Current Every Day Smoker -- 0.50 packs/day for 39 years    Types: Cigarettes  . Smokeless tobacco: Not on file  . Alcohol Use: Yes     Comment: moonshine, quart per day 39 years    Review of Systems  Constitutional: Negative.  Negative  for fever.  Respiratory: Negative.  Negative for cough, chest tightness and shortness of breath.   Cardiovascular: Negative.  Negative for chest pain.  Gastrointestinal: Negative.  Negative for abdominal pain.       Incontinent of stool  Genitourinary: Negative.  Negative for dysuria.  Musculoskeletal: Negative for back pain.  Skin: Negative for wound.  Neurological: Positive for seizures. Negative for dizziness, weakness, light-headedness, numbness and headaches.  All other systems reviewed and are negative.     Allergies  Codeine; Penicillins; and Prednisone  Home Medications   Prior to Admission medications   Medication Sig Start Date End Date Taking? Authorizing Provider  amLODipine (NORVASC) 5 MG tablet Take 1 tablet (5 mg total) by mouth daily. 08/01/13  Yes Gwyneth Sprout, MD  aspirin 325 MG tablet Take 325 mg by mouth daily.   Yes Historical Provider, MD  atorvastatin (LIPITOR) 10 MG tablet Take 20 mg by mouth daily. Take at 6pm 08/01/13  Yes Gwyneth Sprout, MD  carvedilol (COREG) 25 MG tablet Take 1 tablet (25 mg total) by mouth 2 (two) times daily with a meal. 08/01/13  Yes Gwyneth Sprout, MD  clopidogrel (PLAVIX) 75 MG tablet Take 1 tablet (75 mg total) by mouth daily with breakfast. 07/31/13  Yes Rolan Bucco, MD  folic acid (FOLVITE) 1 MG tablet Take 1 tablet (1 mg total) by mouth daily. 10/30/13  Yes Joseph Art, DO  Multiple  Vitamin (MULTIVITAMIN WITH MINERALS) TABS tablet Take 1 tablet by mouth daily. 10/30/13  Yes Joseph Art, DO  ramipril (ALTACE) 10 MG capsule Take 20 mg by mouth daily.  08/01/13  Yes Gwyneth Sprout, MD  thiamine 100 MG tablet Take 1 tablet (100 mg total) by mouth daily. 10/30/13  Yes Jessica U Vann, DO  pantoprazole (PROTONIX) 40 MG tablet Take 1 tablet (40 mg total) by mouth daily. 10/30/13   Jessica U Vann, DO   BP 107/75  Pulse 84  Resp 12  SpO2 96% Physical Exam  Nursing note and vitals reviewed. Constitutional: He is oriented to person,  place, and time.  Elderly, boarded and collared, smells of alcohol  HENT:  Head: Normocephalic and atraumatic.  Mucous membranes dry, and dentures in place  Eyes: Pupils are equal, round, and reactive to light.  Neck:  C. collar in place  Cardiovascular: Normal rate and normal heart sounds.   Irregular rhythm  Pulmonary/Chest: Effort normal and breath sounds normal. No respiratory distress. He has no wheezes. He has no rales.  Abdominal: Soft. Bowel sounds are normal. There is no tenderness. There is no rebound and no guarding.  Musculoskeletal:  Trace bilateral lower extremity edema, no obvious deformity  Lymphadenopathy:    He has no cervical adenopathy.  Neurological: He is alert and oriented to person, place, and time.  Moves all 4 extremities  Skin: Skin is warm and dry.  Psychiatric: He has a normal mood and affect.    ED Course  Procedures (including critical care time) Labs Review Labs Reviewed  CBC WITH DIFFERENTIAL - Abnormal; Notable for the following:    RBC 3.46 (*)    Hemoglobin 10.8 (*)    HCT 32.3 (*)    RDW 16.1 (*)    Platelets 126 (*)    All other components within normal limits  ETHANOL - Abnormal; Notable for the following:    Alcohol, Ethyl (B) 320 (*)    All other components within normal limits  COMPREHENSIVE METABOLIC PANEL - Abnormal; Notable for the following:    Glucose, Bld 106 (*)    Creatinine, Ser 1.45 (*)    AST 108 (*)    ALT 83 (*)    GFR calc non Af Amer 51 (*)    GFR calc Af Amer 59 (*)    Anion gap 18 (*)    All other components within normal limits  I-STAT CG4 LACTIC ACID, ED - Abnormal; Notable for the following:    Lactic Acid, Venous 3.60 (*)    All other components within normal limits  TROPONIN I  URINALYSIS, ROUTINE W REFLEX MICROSCOPIC    Imaging Review Ct Head Wo Contrast  01/02/2014   CLINICAL DATA:  Found on floor; seizure-like activity. Concern for head or cervical spine injury.  EXAM: CT HEAD WITHOUT CONTRAST  CT  CERVICAL SPINE WITHOUT CONTRAST  TECHNIQUE: Multidetector CT imaging of the head and cervical spine was performed following the standard protocol without intravenous contrast. Multiplanar CT image reconstructions of the cervical spine were also generated.  COMPARISON:  CT of the head performed 08/08/2011, and CTA of the head and neck performed 12/03/2010  FINDINGS: CT HEAD FINDINGS  There is no evidence of acute infarction, mass lesion, or intra- or extra-axial hemorrhage on CT.  Prominence of the ventricles and sulci reflects mild cortical volume loss. Chronic encephalomalacia is noted at the left occipital lobe, reflecting remote infarct. Diffuse periventricular and subcortical white matter change likely reflects small vessel  ischemic microangiopathy. Small chronic lacunar infarcts are seen within the basal ganglia bilaterally.  The brainstem and fourth ventricle are within normal limits. No mass effect or midline shift is seen.  There is no evidence of fracture; visualized osseous structures are unremarkable in appearance. The visualized portions of the orbits are within normal limits. The paranasal sinuses and mastoid air cells are well-aerated. No significant soft tissue abnormalities are seen.  CT CERVICAL SPINE FINDINGS  There is no evidence of fracture or subluxation. Vertebral bodies demonstrate normal height and alignment. Large anterior osteophytes are seen along the cervical spine. Intervertebral disc spaces are preserved. Prevertebral soft tissues are within normal limits. The visualized neural foramina are grossly unremarkable.  The thyroid gland is unremarkable in appearance. The visualized lung apices are clear. Calcification is noted at the carotid bifurcations bilaterally.  IMPRESSION: 1. No evidence of traumatic intracranial injury or fracture. 2. No evidence of fracture or subluxation along the cervical spine. 3. Large anterior osteophytes seen along the cervical spine. 4. Mild cortical volume  loss and diffuse small vessel ischemic microangiopathy. 5. Chronic encephalomalacia at the left occipital lobe, reflecting remote infarct. Small chronic lacunar infarcts within the basal ganglia bilaterally.   Electronically Signed   By: Roanna RaiderJeffery  Chang M.D.   On: 01/02/2014 06:00   Ct Cervical Spine Wo Contrast  01/02/2014   CLINICAL DATA:  Found on floor; seizure-like activity. Concern for head or cervical spine injury.  EXAM: CT HEAD WITHOUT CONTRAST  CT CERVICAL SPINE WITHOUT CONTRAST  TECHNIQUE: Multidetector CT imaging of the head and cervical spine was performed following the standard protocol without intravenous contrast. Multiplanar CT image reconstructions of the cervical spine were also generated.  COMPARISON:  CT of the head performed 08/08/2011, and CTA of the head and neck performed 12/03/2010  FINDINGS: CT HEAD FINDINGS  There is no evidence of acute infarction, mass lesion, or intra- or extra-axial hemorrhage on CT.  Prominence of the ventricles and sulci reflects mild cortical volume loss. Chronic encephalomalacia is noted at the left occipital lobe, reflecting remote infarct. Diffuse periventricular and subcortical white matter change likely reflects small vessel ischemic microangiopathy. Small chronic lacunar infarcts are seen within the basal ganglia bilaterally.  The brainstem and fourth ventricle are within normal limits. No mass effect or midline shift is seen.  There is no evidence of fracture; visualized osseous structures are unremarkable in appearance. The visualized portions of the orbits are within normal limits. The paranasal sinuses and mastoid air cells are well-aerated. No significant soft tissue abnormalities are seen.  CT CERVICAL SPINE FINDINGS  There is no evidence of fracture or subluxation. Vertebral bodies demonstrate normal height and alignment. Large anterior osteophytes are seen along the cervical spine. Intervertebral disc spaces are preserved. Prevertebral soft tissues  are within normal limits. The visualized neural foramina are grossly unremarkable.  The thyroid gland is unremarkable in appearance. The visualized lung apices are clear. Calcification is noted at the carotid bifurcations bilaterally.  IMPRESSION: 1. No evidence of traumatic intracranial injury or fracture. 2. No evidence of fracture or subluxation along the cervical spine. 3. Large anterior osteophytes seen along the cervical spine. 4. Mild cortical volume loss and diffuse small vessel ischemic microangiopathy. 5. Chronic encephalomalacia at the left occipital lobe, reflecting remote infarct. Small chronic lacunar infarcts within the basal ganglia bilaterally.   Electronically Signed   By: Roanna RaiderJeffery  Chang M.D.   On: 01/02/2014 06:00     EKG Interpretation   Date/Time:  Sunday January 02 2014  04:13:25 EDT Ventricular Rate:  63 PR Interval:  182 QRS Duration: 91 QT Interval:  451 QTC Calculation: 462 R Axis:   44 Text Interpretation:  Sinus rhythm Atrial premature complex LVH with  secondary repolarization abnormality Anterior Q waves, possibly due to LVH  No longer tachycardic, T wave inversions laterally similar to prior  Confirmed by Earvin Blazier  MD, Denym Rahimi ([id number]) on 01/02/2014 4:20:37 AM      MDM   Final diagnoses:  Loss of consciousness  Hypotension, unspecified hypotension type  Lactic acidosis  Alcoholism    Patient presents with seizure versus syncope. Was just admitted in June for syncopal workup. At that time had an echo that showed an EF of 20-25%. Patient is nonfocal on exam. Basic labwork obtained.  Patient was given gentle rehydration with improvement of his blood pressure. Rectal temperature noted to be 95. Lactate, chest x-ray, and urinalysis added to patient's workup to evaluate for infection. Patient meets 1/4 SIRS criteria at this time. Lactate is elevated at 3.6.  Imaging including CT head, neck, and chest x-ray were reviewed by myself. No acute findings noted by me.   Urinalysis pending.  Alcohol level 320.    Consulted neurology given history of seizures. Patient was previously on Dilantin. Per Dr. Roseanne Reno, given current transient hypotension, would avoid reloading with Dilantin. We'll place on CIWA protocol to monitor for alcohol withdrawal seizures. When blood pressure has stabilized, patient will need to be put back on Dilantin.  The patient's presentation is likely seizure versus syncope and alcohol related. He had a similar presentation and admission in June. He is fluid responsive. At this time there is no evidence of infection. Urinalysis is pending.  Will admit patient for gentle rehydration and further management. I have requested that his ICD be interrogated.  Discussed with Dr. Gerri Lins.    Shon Baton, MD 01/02/14 [phone]  ICD without any events that correspond to episode.  Did have 9 episodes of ventricular tachycardia yesterday, HR 150's.  DId not trigger for intervention.  Shon Baton, MD 01/02/14 [phone]

## 2014-01-02 NOTE — ED Notes (Signed)
Pt from home. Relative had to break down bathroom door to find pt actively seizing and lying on the floor. Family reports pt actively seizing for 10 minutes. Pt is now alert and oriented,. Does not remember having a seizure.

## 2014-01-03 ENCOUNTER — Encounter (HOSPITAL_COMMUNITY): Payer: Self-pay | Admitting: *Deleted

## 2014-01-03 LAB — COMPREHENSIVE METABOLIC PANEL
ALT: 76 U/L — ABNORMAL HIGH (ref 0–53)
AST: 88 U/L — ABNORMAL HIGH (ref 0–37)
Albumin: 3.6 g/dL (ref 3.5–5.2)
Alkaline Phosphatase: 113 U/L (ref 39–117)
Anion gap: 20 — ABNORMAL HIGH (ref 5–15)
BUN: 11 mg/dL (ref 6–23)
CO2: 23 mEq/L (ref 19–32)
Calcium: 9.1 mg/dL (ref 8.4–10.5)
Chloride: 101 mEq/L (ref 96–112)
Creatinine, Ser: 1.06 mg/dL (ref 0.50–1.35)
GFR calc Af Amer: 86 mL/min — ABNORMAL LOW (ref >90)
GFR calc non Af Amer: 74 mL/min — ABNORMAL LOW (ref >90)
Glucose, Bld: 76 mg/dL (ref 70–99)
Potassium: 3.5 mEq/L — ABNORMAL LOW (ref 3.7–5.3)
Sodium: 144 mEq/L (ref 137–147)
Total Bilirubin: 0.8 mg/dL (ref 0.3–1.2)
Total Protein: 6.9 g/dL (ref 6.0–8.3)

## 2014-01-03 LAB — CBC
HCT: 32.5 % — ABNORMAL LOW (ref 39.0–52.0)
Hemoglobin: 10.9 g/dL — ABNORMAL LOW (ref 13.0–17.0)
MCH: 31 pg (ref 26.0–34.0)
MCHC: 33.5 g/dL (ref 30.0–36.0)
MCV: 92.3 fL (ref 78.0–100.0)
Platelets: 115 10*3/uL — ABNORMAL LOW (ref 150–400)
RBC: 3.52 MIL/uL — ABNORMAL LOW (ref 4.22–5.81)
RDW: 15.8 % — ABNORMAL HIGH (ref 11.5–15.5)
WBC: 3.4 10*3/uL — ABNORMAL LOW (ref 4.0–10.5)

## 2014-01-03 LAB — PROTIME-INR
INR: 1.18 (ref 0.00–1.49)
Prothrombin Time: 15 seconds (ref 11.6–15.2)

## 2014-01-03 MED ORDER — LORAZEPAM 2 MG/ML IJ SOLN
2.0000 mg | Freq: Once | INTRAMUSCULAR | Status: AC
  Administered 2014-01-03: 2 mg via INTRAVENOUS

## 2014-01-03 MED ORDER — PHENYTOIN 50 MG PO CHEW: 400.0000 mg | CHEWABLE_TABLET | Freq: Three times a day (TID) | ORAL | Status: DC

## 2014-01-03 MED ORDER — LORAZEPAM 2 MG/ML IJ SOLN
INTRAMUSCULAR | Status: AC
  Filled 2014-01-03: qty 1

## 2014-01-03 MED ORDER — CARVEDILOL 25 MG PO TABS
25.0000 mg | ORAL_TABLET | Freq: Two times a day (BID) | ORAL | Status: DC
  Filled 2014-01-03 (×2): qty 1

## 2014-01-03 MED ORDER — RAMIPRIL 10 MG PO CAPS
20.0000 mg | ORAL_CAPSULE | Freq: Every day | ORAL | Status: DC
  Administered 2014-01-03 – 2014-01-07 (×5): 20 mg via ORAL
  Filled 2014-01-03 (×5): qty 2

## 2014-01-03 MED ORDER — AMLODIPINE BESYLATE 10 MG PO TABS
10.0000 mg | ORAL_TABLET | Freq: Every day | ORAL | Status: DC
  Administered 2014-01-03 – 2014-01-07 (×5): 10 mg via ORAL
  Filled 2014-01-03 (×5): qty 1

## 2014-01-03 MED ORDER — SODIUM CHLORIDE 0.9 % IV SOLN
750.0000 mg | Freq: Once | INTRAVENOUS | Status: AC
  Administered 2014-01-03: 750 mg via INTRAVENOUS
  Filled 2014-01-03: qty 15

## 2014-01-03 MED ORDER — PHENYTOIN SODIUM EXTENDED 100 MG PO CAPS
300.0000 mg | ORAL_CAPSULE | Freq: Every day | ORAL | Status: DC
  Administered 2014-01-03 – 2014-01-06 (×4): 300 mg via ORAL
  Filled 2014-01-03 (×6): qty 3

## 2014-01-03 MED ORDER — POTASSIUM CHLORIDE CRYS ER 20 MEQ PO TBCR
40.0000 meq | EXTENDED_RELEASE_TABLET | Freq: Two times a day (BID) | ORAL | Status: AC
  Administered 2014-01-03 (×2): 40 meq via ORAL
  Filled 2014-01-03 (×2): qty 2

## 2014-01-03 NOTE — Evaluation (Signed)
Physical Therapy Evaluation Patient Details Name: TAHAJ STAKER MRN: [id number] DOB: [birth year] Today's Date: 01/03/2014   History of Present Illness  58 yo M With breakthrough seizure in the setting of medication non-compliance.  Clinical Impression  Patient demonstrates significant deficits in functional mobility as indicated below. At this time patient will need continued skilled PT to address deficits and maximize function. Given patients history of falls (reported 3 a day per wife) and patient significant instability and poor safety awareness, patient is exteremly high fall risk at this time.  Will continue to see as indicated and progress as tolerated. Recommend ST SNF upon acute discharge. Spoke with wife at length regarding concerns and she is in agreement at this time.    Follow Up Recommendations SNF;Supervision/Assistance - 24 hour    Equipment Recommendations  Rolling walker with 5" wheels    Recommendations for Other Services       Precautions / Restrictions Precautions Precautions: Fall Restrictions Weight Bearing Restrictions: No      Mobility  Bed Mobility Overal bed mobility: Needs Assistance Bed Mobility: Supine to Sit;Sit to Supine     Supine to sit: Min assist Sit to supine: Mod assist   General bed mobility comments: patient demonstrates poor awareness of positioning or sequencing for coming to EOB or returning to bed, patient insistant that his legs were on the bed yet they were hanging of the side completely.  Cognitive function limiting physical ability  Transfers Overall transfer level: Needs assistance Equipment used: Rolling walker (2 wheeled) Transfers: Sit to/from Stand Sit to Stand: Mod assist;+2 safety/equipment         General transfer comment: patient with significant posterior lean, attempted to stand x6 on his own but continuously falling back onto the bed. Patient required assist for stability, assist to come to upright and VCs for  safety and hand positioning  Ambulation/Gait Ambulation/Gait assistance: Mod assist;+2 physical assistance Ambulation Distance (Feet): 140 Feet Assistive device: Rolling walker (2 wheeled) Gait Pattern/deviations: Staggering left;Staggering right;Trunk flexed;Narrow base of support;Ataxic Gait velocity: decreased Gait velocity interpretation: Below normal speed for age/gender General Gait Details: patient unsafe with ambulation, multiple complete loss of balance requiring full body support from this therapist in addition to use of gait belt and RW. Therapist positioned on the left lateral side of patient. with wrap around technique for stabiliy  Stairs            Wheelchair Mobility    Modified Rankin (Stroke Patients Only)       Balance Overall balance assessment: Needs assistance Sitting-balance support: Feet supported Sitting balance-Leahy Scale: Poor Sitting balance - Comments: patient with very significant posterior bias, falling back onto bed with any dynamic movement Postural control: Posterior lean;Left lateral lean Standing balance support: Bilateral upper extremity supported;During functional activity Standing balance-Leahy Scale: Poor Standing balance comment: significant lateral lean, instability requiring support +2                             Pertinent Vitals/Pain Pain Assessment: No/denies pain    Home Living Family/patient expects to be discharged to:: Private residence Living Arrangements: Spouse/significant other Available Help at Discharge: Family Type of Home: House Home Access: Stairs to enter Entrance Stairs-Rails: None Entrance Stairs-Number of Steps: 1 Home Layout: One level Home Equipment: None      Prior Function           Comments: per wife, patient has a history of 3-4 falls per  day. he would ambulate without assistance     Hand Dominance   Dominant Hand: Right    Extremity/Trunk Assessment   Upper Extremity  Assessment: Generalized weakness           Lower Extremity Assessment: Generalized weakness (poor coordination)      Cervical / Trunk Assessment: Kyphotic  Communication      Cognition Arousal/Alertness: Awake/alert Behavior During Therapy: Impulsive Overall Cognitive Status: History of cognitive impairments - at baseline Area of Impairment: Orientation;Attention;Following commands;Safety/judgement;Awareness;Problem solving Orientation Level: Disoriented to;Time;Situation Current Attention Level: Focused   Following Commands: Follows one step commands with increased time;Follows multi-step commands inconsistently Safety/Judgement: Decreased awareness of safety;Decreased awareness of deficits Awareness: Intellectual Problem Solving: Slow processing;Decreased initiation;Difficulty sequencing;Requires verbal cues;Requires tactile cues General Comments: patient with poor ability to follow commands and execute tasks, wife reports that this is worse than baseline    General Comments      Exercises        Assessment/Plan    PT Assessment Patient needs continued PT services  PT Diagnosis Difficulty walking;Abnormality of gait;Generalized weakness;Altered mental status   PT Problem List Decreased strength;Decreased activity tolerance;Decreased balance;Decreased mobility;Decreased coordination;Decreased cognition;Decreased knowledge of use of DME;Decreased safety awareness  PT Treatment Interventions DME instruction;Gait training;Functional mobility training;Therapeutic activities;Therapeutic exercise;Balance training;Patient/family education   PT Goals (Current goals can be found in the Care Plan section) Acute Rehab PT Goals Patient Stated Goal: none stated by pt, wife states that she needs him to be safer with mobility PT Goal Formulation: With patient/family Time For Goal Achievement: 01/17/14 Potential to Achieve Goals: Fair    Frequency Min 3X/week   Barriers to  discharge        Co-evaluation               End of Session Equipment Utilized During Treatment: Gait belt Activity Tolerance: Patient tolerated treatment well Patient left: in bed;with call bell/phone within reach;with bed alarm set;with family/visitor present Nurse Communication: Mobility status         Time: 7846-9629 PT Time Calculation (min): 25 min   Charges:   PT Evaluation $Initial PT Evaluation Tier I: 1 Procedure PT Treatments $Gait Training: 8-22 mins $Self Care/Home Management: 8-22   PT G CodesFabio Asa 01/03/2014, 3:30 PM Charlotte Crumb, PT DPT  [phone]

## 2014-01-03 NOTE — Progress Notes (Signed)
Subjective: No further episodes. Wife reports he is back to baseline. Has memory problems at baseline.   Exam: Filed Vitals:   01/03/14 1103  BP: 166/112  Pulse:   Temp:   Resp:    Gen: In bed, NAD MS: Awake, alert, interactive and appropriate. He is not oriented to year, but wife states taht this is baseline for him.  ER:QSXQK Motor: MAEW Sensory:intact to LT   Impression: 58 yo M With breakthrough seizure in the setting of medication non-compliance. He had apparently not been taking dilantin for at least several days as his wife was in the hospital with their daughter who was giving birth.   Recommendations: 1) dilantin will give 10mg /kg load, then back to normal 300mg  QHS 2) as long as patient tolerates dilantin well, he is back to baseline and could go home from neuro perspective. Marland Kitchen  3)   Ritta Slot, MD [facility] [phone]  If 7pm- 7am, please page neurology on call as listed in [facility].

## 2014-01-03 NOTE — Progress Notes (Signed)
[facility] PROGRESS NOTE  [name] Phillips YEL:[id number] DOB: 1956/05/07 DOA: 01/02/2014 PCP: Pola Corn, MD  Assessment/Plan: 1. SYNCOPE:  - possibly secondary to seizure activity vs etoh intoxication or both.  - on dilantin and CIWA protocol.  - watch for withdrawal symptoms.   Hypokalemia:  will repleted as needed.   Accelerated hypertension: - probably a combination of missing his meds and withdrawal symptoms.   Abnormal UA: He denies any dysuria symptoms. Will get urine culture.   DVT prophylaxis.    Code Status: fullc oe.  Family Communication: wife at bedside Disposition Plan: SNF when bed available.    Consultants:  NEUROLOGY  Procedures:  EEG  Antibiotics:  none  HPI/Subjective: Tongue pain.   Objective: Filed Vitals:   01/03/14 1415  BP: 160/109  Pulse: 110  Temp: 98.6 F (37 C)  Resp: 18    Intake/Output Summary (Last 24 hours) at 01/03/14 1843 Last data filed at 01/03/14 0500  Gross per 24 hour  Intake      0 ml  Output    950 ml  Net   -950 ml   Filed Weights   01/02/14 0656  Weight: 75.751 kg (167 lb)    Exam:   General:  Alert and oriented.   Cardiovascular: normal s1s2 , no mumers , rubs or gallops  Respiratory: chest clear to auscultation, no wheezing or rhonchi  Abdomen: soft non tender non distended, bowel sounds heard  Musculoskeletal: no pedal edema  Data Reviewed: Basic Metabolic Panel:  Recent Labs Lab 01/02/14 0412 01/03/14 0332  NA 146 144  K 3.8 3.5*  CL 105 101  CO2 23 23  GLUCOSE 106* 76  BUN 10 11  CREATININE 1.45* 1.06  CALCIUM 8.7 9.1   Liver Function Tests:  Recent Labs Lab 01/02/14 0412 01/03/14 0332  AST 108* 88*  ALT 83* 76*  ALKPHOS 109 113  BILITOT 0.4 0.8  PROT 6.6 6.9  ALBUMIN 3.5 3.6   No results found for this basename: LIPASE, AMYLASE,  in the last 168 hours No results found for this basename: AMMONIA,  in the last 168 hours CBC:  Recent Labs Lab  01/02/14 0412 01/03/14 0332  WBC 5.7 3.4*  NEUTROABS 3.4  --   HGB 10.8* 10.9*  HCT 32.3* 32.5*  MCV 93.4 92.3  PLT 126* 115*   Cardiac Enzymes:  Recent Labs Lab 01/02/14 0412  TROPONINI <0.30   BNP (last 3 results)  Recent Labs  10/27/13 2038  PROBNP 692.6*   CBG: No results found for this basename: GLUCAP,  in the last 168 hours  No results found for this or any previous visit (from the past 240 hour(s)).   Studies: Ct Head Wo Contrast  01/02/2014   CLINICAL DATA:  Found on floor; seizure-like activity. Concern for head or cervical spine injury.  EXAM: CT HEAD WITHOUT CONTRAST  CT CERVICAL SPINE WITHOUT CONTRAST  TECHNIQUE: Multidetector CT imaging of the head and cervical spine was performed following the standard protocol without intravenous contrast. Multiplanar CT image reconstructions of the cervical spine were also generated.  COMPARISON:  CT of the head performed 08/08/2011, and CTA of the head and neck performed 12/03/2010  FINDINGS: CT HEAD FINDINGS  There is no evidence of acute infarction, mass lesion, or intra- or extra-axial hemorrhage on CT.  Prominence of the ventricles and sulci reflects mild cortical volume loss. Chronic encephalomalacia is noted at the left occipital lobe, reflecting remote infarct. Diffuse periventricular and subcortical white matter change  likely reflects small vessel ischemic microangiopathy. Small chronic lacunar infarcts are seen within the basal ganglia bilaterally.  The brainstem and fourth ventricle are within normal limits. No mass effect or midline shift is seen.  There is no evidence of fracture; visualized osseous structures are unremarkable in appearance. The visualized portions of the orbits are within normal limits. The paranasal sinuses and mastoid air cells are well-aerated. No significant soft tissue abnormalities are seen.  CT CERVICAL SPINE FINDINGS  There is no evidence of fracture or subluxation. Vertebral bodies demonstrate  normal height and alignment. Large anterior osteophytes are seen along the cervical spine. Intervertebral disc spaces are preserved. Prevertebral soft tissues are within normal limits. The visualized neural foramina are grossly unremarkable.  The thyroid gland is unremarkable in appearance. The visualized lung apices are clear. Calcification is noted at the carotid bifurcations bilaterally.  IMPRESSION: 1. No evidence of traumatic intracranial injury or fracture. 2. No evidence of fracture or subluxation along the cervical spine. 3. Large anterior osteophytes seen along the cervical spine. 4. Mild cortical volume loss and diffuse small vessel ischemic microangiopathy. 5. Chronic encephalomalacia at the left occipital lobe, reflecting remote infarct. Small chronic lacunar infarcts within the basal ganglia bilaterally.   Electronically Signed   By: Roanna Raider M.D.   On: 01/02/2014 06:00   Ct Cervical Spine Wo Contrast  01/02/2014   CLINICAL DATA:  Found on floor; seizure-like activity. Concern for head or cervical spine injury.  EXAM: CT HEAD WITHOUT CONTRAST  CT CERVICAL SPINE WITHOUT CONTRAST  TECHNIQUE: Multidetector CT imaging of the head and cervical spine was performed following the standard protocol without intravenous contrast. Multiplanar CT image reconstructions of the cervical spine were also generated.  COMPARISON:  CT of the head performed 08/08/2011, and CTA of the head and neck performed 12/03/2010  FINDINGS: CT HEAD FINDINGS  There is no evidence of acute infarction, mass lesion, or intra- or extra-axial hemorrhage on CT.  Prominence of the ventricles and sulci reflects mild cortical volume loss. Chronic encephalomalacia is noted at the left occipital lobe, reflecting remote infarct. Diffuse periventricular and subcortical white matter change likely reflects small vessel ischemic microangiopathy. Small chronic lacunar infarcts are seen within the basal ganglia bilaterally.  The brainstem and  fourth ventricle are within normal limits. No mass effect or midline shift is seen.  There is no evidence of fracture; visualized osseous structures are unremarkable in appearance. The visualized portions of the orbits are within normal limits. The paranasal sinuses and mastoid air cells are well-aerated. No significant soft tissue abnormalities are seen.  CT CERVICAL SPINE FINDINGS  There is no evidence of fracture or subluxation. Vertebral bodies demonstrate normal height and alignment. Large anterior osteophytes are seen along the cervical spine. Intervertebral disc spaces are preserved. Prevertebral soft tissues are within normal limits. The visualized neural foramina are grossly unremarkable.  The thyroid gland is unremarkable in appearance. The visualized lung apices are clear. Calcification is noted at the carotid bifurcations bilaterally.  IMPRESSION: 1. No evidence of traumatic intracranial injury or fracture. 2. No evidence of fracture or subluxation along the cervical spine. 3. Large anterior osteophytes seen along the cervical spine. 4. Mild cortical volume loss and diffuse small vessel ischemic microangiopathy. 5. Chronic encephalomalacia at the left occipital lobe, reflecting remote infarct. Small chronic lacunar infarcts within the basal ganglia bilaterally.   Electronically Signed   By: Roanna Raider M.D.   On: 01/02/2014 06:00   Dg Chest Portable 1 View  01/02/2014  CLINICAL DATA:  Status post fall; concern for chest injury.  EXAM: PORTABLE CHEST - 1 VIEW  COMPARISON:  Chest radiograph performed 10/27/2013  FINDINGS: The lungs are well-aerated. Minimal left basilar atelectasis is noted. There is no evidence of focal opacification, pleural effusion or pneumothorax.  The cardiomediastinal silhouette is borderline enlarged. An AICD is noted overlying the right chest wall, with a single lead ending overlying the right ventricle. No acute osseous abnormalities are seen.  IMPRESSION: Minimal left  basilar atelectasis noted; lungs otherwise clear. No displaced rib fracture seen.   Electronically Signed   By: Roanna Raider M.D.   On: 01/02/2014 06:58    Scheduled Meds: . amLODipine  10 mg Oral Daily  . aspirin  325 mg Oral Daily  . atorvastatin  20 mg Oral q1800  . clopidogrel  75 mg Oral Q breakfast  . enoxaparin (LOVENOX) injection  40 mg Subcutaneous Daily  . LORazepam  0-4 mg Oral 4 times per day   Followed by  . [START ON 01/04/2014] LORazepam  0-4 mg Oral Q12H  . multivitamin with minerals  1 tablet Oral Daily  . pantoprazole  40 mg Oral Daily  . phenytoin  300 mg Oral QHS  . pneumococcal 23 valent vaccine  0.5 mL Intramuscular Tomorrow-1000  . potassium chloride  40 mEq Oral BID  . ramipril  20 mg Oral Daily  . sodium chloride  3 mL Intravenous Q12H   Continuous Infusions: . dextrose    . dextrose      Active Problems:   Tobacco abuse   Systolic CHF   Syncope   Loss of consciousness   Seizure   Hypothermia   Alcohol intoxication   Cardiomyopathy, nonischemic   Hypotension    Time spent: 25 minutes.     [facility] [place]  [facility] Pager [phone]. If 7PM-7AM, please contact night-coverage at [email or web address], password [facility] 01/03/2014, 6:43 PM  LOS: 1 day

## 2014-01-03 NOTE — Progress Notes (Signed)
Patient continues to be agitated, hallucinating, and having auditory disturbances. On call NP made aware. Order received for IV ativan. Will give and continue to monitor.

## 2014-01-04 DIAGNOSIS — G40909 Epilepsy, unspecified, not intractable, without status epilepticus: Secondary | ICD-10-CM | POA: Diagnosis not present

## 2014-01-04 LAB — BASIC METABOLIC PANEL
Anion gap: 18 — ABNORMAL HIGH (ref 5–15)
BUN: 12 mg/dL (ref 6–23)
CO2: 25 mEq/L (ref 19–32)
Calcium: 10 mg/dL (ref 8.4–10.5)
Chloride: 96 mEq/L (ref 96–112)
Creatinine, Ser: 0.93 mg/dL (ref 0.50–1.35)
GFR calc Af Amer: >90 mL/min (ref >90)
GFR calc non Af Amer: 89 mL/min — ABNORMAL LOW (ref >90)
Glucose, Bld: 117 mg/dL — ABNORMAL HIGH (ref 70–99)
Potassium: 3.7 mEq/L (ref 3.7–5.3)
Sodium: 139 mEq/L (ref 137–147)

## 2014-01-04 LAB — GLUCOSE, CAPILLARY
Glucose-Capillary: 104 mg/dL — ABNORMAL HIGH (ref 70–99)
Glucose-Capillary: 149 mg/dL — ABNORMAL HIGH (ref 70–99)
Glucose-Capillary: 128 mg/dL — ABNORMAL HIGH (ref 70–99)
Glucose-Capillary: 130 mg/dL — ABNORMAL HIGH (ref 70–99)

## 2014-01-04 MED ORDER — CARVEDILOL 25 MG PO TABS
25.0000 mg | ORAL_TABLET | Freq: Two times a day (BID) | ORAL | Status: DC
  Administered 2014-01-05 – 2014-01-07 (×5): 25 mg via ORAL
  Filled 2014-01-04 (×8): qty 1

## 2014-01-04 MED ORDER — AMLODIPINE BESYLATE 10 MG PO TABS: 10.0000 mg | ORAL_TABLET | Freq: Every day | ORAL | Status: DC

## 2014-01-04 MED ORDER — PHENYTOIN SODIUM EXTENDED 300 MG PO CAPS: 300.0000 mg | ORAL_CAPSULE | Freq: Every day | ORAL | Status: DC

## 2014-01-04 MED ORDER — LORAZEPAM 2 MG/ML IJ SOLN
2.0000 mg | Freq: Once | INTRAMUSCULAR | Status: AC
  Administered 2014-01-04: 2 mg via INTRAVENOUS
  Filled 2014-01-04: qty 1

## 2014-01-04 NOTE — Care Management Note (Addendum)
    Page 1 of 1   01/07/2014     12:07:17 PM CARE MANAGEMENT NOTE 01/07/2014  Patient:  [name] Phillips, [name] Phillips   Account Number:  [id number]  Date Initiated:  01/04/2014  Documentation initiated by:  GRAVES-BIGELOW,BRENDA  Subjective/Objective Assessment:   Pt admitted for  syncope and fall. Initiated on CIWA Protocol.     Action/Plan:   referral received for medication needs. Pt plan for SNF once medically stable. CSW assisting with disposition needs. No further needs from CM.   Anticipated DC Date:  01/07/2014   Anticipated DC Plan:  SKILLED NURSING FACILITY  In-house referral  Clinical Social Worker      DC Planning Services  CM consult      Choice offered to / List presented to:             Status of service:  Completed, signed off Medicare Important Message given?  YES (If response is "NO", the following Medicare IM given date fields will be blank) Date Medicare IM given:  01/04/2014 Medicare IM given by:  GRAVES-BIGELOW,BRENDA Date Additional Medicare IM given:  01/07/2014 Additional Medicare IM given by:  Donn Pierini  Discharge Disposition:  SKILLED NURSING FACILITY  Per UR Regulation:  Reviewed for med. necessity/level of care/duration of stay  If discussed at Long Length of Stay Meetings, dates discussed:    Comments:   01/07/14- 1200- Donn Pierini RN, BSN [phone] Pt for d/c today to SNF- [facility]- CSW following

## 2014-01-04 NOTE — Progress Notes (Signed)
CSW (Clinical Child psychotherapist) provided pt wife with bed offers. Pt wife still wanting to hear from CIR. CSW asked rehab admissions coordinator to please touch base with pt wife. Pt wife did choose [facility]. CSW notified facility and they are able to accept when medically stable. CSW paged MD to notify and clarify if pt is ready for dc today.  Jamarie [place], LCSWA [phone]

## 2014-01-04 NOTE — Progress Notes (Signed)
Clinical Social Work Department BRIEF PSYCHOSOCIAL ASSESSMENT 01/04/2014  Patient:  [name] Phillips, [name] Phillips     Account Number:  [id number]     Admit date:  01/02/2014  Clinical Social Worker:  Harless Nakayama  Date/Time:  01/04/2014 10:00 AM  Referred by:  Physician  Date Referred:  01/04/2014 Referred for  SNF Placement   Other Referral:   Interview type:  Family Other interview type:   Spoke with pt wife at bedside    PSYCHOSOCIAL DATA Living Status:  WIFE Admitted from facility:   Level of care:   Primary support name:  [name] Phillips Primary support relationship to patient:  SPOUSE Degree of support available:   Pt wife reports that pt has strong support available    CURRENT CONCERNS Current Concerns  Post-Acute Placement   Other Concerns:    SOCIAL WORK ASSESSMENT / PLAN CSW aware of PT recommendation. Prior to CSW speaking with family, OT informed CSW that CIR will be recommended. CSW visited room and pt alert but not oriented. CSW spoke with pt wife at bedside. Pt wife aware of recommendations as discussed by PT and OT. CSW spoke with pt wife about SNF backup to CIR. Pt wife very much hoping pt is appropriate for CIR but is open to sending out SNF referral to [facility]. Pt wife informed CSW that prior to admission she was at home with pt and pt has friends and other family that stopped by daily to visit and assist with anything. Per wife, pt was able to bath, toilet, and feed self. Pt wife informed CSW that pt has been drinking for "a long time now" and pt has not expressed interested to stop drinking. Pt wife informed CSW that doctors have spoken to pt in the past but pt not receptive. Pt not able to participate to complete SBIRT at this time and be provided with resources. CSW to continue to follow and speak with pt if pt becomes less confused.   Assessment/plan status:  Psychosocial Support/Ongoing Assessment of Needs Other assessment/ plan:   Information/referral  to community resources:   SNF list to be provided with bed offers.    PATIENT'S/FAMILY'S RESPONSE TO PLAN OF CARE: Pt wife pleasant and coopeartive. Pt wife hoping for CIR but agreeable to SNF backup       Dierdre Mccalip, LCSWA [phone]

## 2014-01-04 NOTE — Evaluation (Signed)
Clinical/Bedside Swallow Evaluation Patient Details  Name: [name] Phillips MRN: [id number] Date of Birth: 07/21/1955  Today's Date: 01/04/2014 Time: 4098-1191 SLP Time Calculation (min): 23 min  Past Medical History:  Past Medical History  Diagnosis Date  . CVA (cerebral vascular accident)   . Hypertension   . CHF (congestive heart failure)   . Seizure   . Nonischemic cardiomyopathy     Catheterization 2005 normal coronary arteries; EF 15-20%;  2011 EF normal  . Alcohol abuse    Past Surgical History:  Past Surgical History  Procedure Laterality Date  . Cardiac defibrillator placement  2007    MDT [employer] single chamber ICD implanted with 6949 lead by Dr Ty Hilts for primary prevention   HPI:  58 yo M With breakthrough seizure in the setting of medication non-compliance. PMH significant for CVA. Wife denies h/o dysphagia, although does report that he coughs at home particularly when food is very dry or when he tries to drink while lying down. CT Head 8/9 indicates "large anterior osteophytes seen along the cervical spine."   Assessment / Plan / Recommendation Clinical Impression  Pt does not exhibit overt s/s of aspiration throughout PO trials with SLP, although increasing lethargy throughout intake does increase aspiration risk. SLP provided Mod cues for alertness and Min cues for oral clearance of Dys 3 textures in order to increase safety with swallowing. Recommend softening foods to Dys 3 textures with thin liquids, to be consumed when fully upright and maximally alert. Will follow briefly for tolerance.    Aspiration Risk  Moderate    Diet Recommendation Dysphagia 3 (Mechanical Soft);Thin liquid (extra sauce/gravy)   Liquid Administration via: Cup;Straw Medication Administration: Whole meds with puree Compensations: Slow rate;Small sips/bites;Check for pocketing Postural Changes and/or Swallow Maneuvers: Seated upright 90 degrees;Upright 30-60 min after meal    Other   Recommendations Oral Care Recommendations: Oral care BID   Follow Up Recommendations  Skilled Nursing facility;24 hour supervision/assistance    Frequency and Duration min 2x/week  1 week   Pertinent Vitals/Pain n/a    SLP Swallow Goals     Swallow Study Prior Functional Status       General Date of Onset: 01/02/14 HPI: 58 yo M With breakthrough seizure in the setting of medication non-compliance. PMH significant for CVA. Wife denies h/o dysphagia, although does report that he coughs at home particularly when food is very dry or when he tries to drink while lying down. CT Head 8/9 indicates "large anterior osteophytes seen along the cervical spine." Type of Study: Bedside swallow evaluation Previous Swallow Assessment: none in chart Diet Prior to this Study: Regular;Thin liquids Temperature Spikes Noted: No Respiratory Status: Room air History of Recent Intubation: No Behavior/Cognition: Lethargic;Requires cueing Oral Cavity - Dentition: Dentures, top;Dentures, bottom Self-Feeding Abilities: Total assist (mitts) Patient Positioning: Upright in bed Baseline Vocal Quality: Low vocal intensity Volitional Cough: Strong;Other (Comment) (requires cueing to elicit) Volitional Swallow: Able to elicit    Oral/Motor/Sensory Function Overall Oral Motor/Sensory Function: Other (comment) (difficulty following commands for more thorough assessment) Lingual Symmetry: Within Functional Limits Facial Symmetry: Within Functional Limits Velum: Within Functional Limits Mandible: Within Functional Limits   Ice Chips Ice chips: Not tested   Thin Liquid Thin Liquid: Impaired Presentation: Cup;Straw Pharyngeal  Phase Impairments: Suspected delayed Swallow;Multiple swallows;Other (comments) (audible swallow)    Nectar Thick Nectar Thick Liquid: Not tested   Honey Thick Honey Thick Liquid: Not tested   Puree Puree: Impaired Presentation: Spoon Pharyngeal Phase Impairments: Multiple swallows  Solid   GO    Solid: Impaired Oral Phase Impairments: Poor awareness of bolus;Impaired mastication;Impaired anterior to posterior transit Oral Phase Functional Implications: Oral residue       Maxcine Ham, M.A. CCC-SLP [phone]  Maxcine Ham 01/04/2014,1:58 PM

## 2014-01-04 NOTE — Progress Notes (Signed)
Rehab Admissions Coordinator Note:  Patient was screened by Trish Mage for appropriateness for an Inpatient Acute Rehab Consult.  At this time, we are recommending Skilled Nursing Facility.  Given current diagnosis, we would not be able to get authorization for an acute inpatient rehab admission from [facility].  Call me for questions.  Trish Mage 01/04/2014, 11:24 AM  I can be reached at [phone].

## 2014-01-04 NOTE — Progress Notes (Addendum)
Clinical Social Work Department CLINICAL SOCIAL WORK PLACEMENT NOTE 01/04/2014  Patient:  [name] Phillips, [name] Phillips  Account Number:  [id number] Admit date:  01/02/2014  Clinical Social Worker:  Sharol Harness, Theresia Majors  Date/time:  01/04/2014 10:00 AM  Clinical Social Work is seeking post-discharge placement for this patient at the following level of care:   SKILLED NURSING   (*CSW will update this form in Epic as items are completed)   01/04/2014  Patient/family provided with [facility] Department of Clinical Social Work's list of facilities offering this level of care within the geographic area requested by the patient (or if unable, by the patient's family).  01/04/2014  Patient/family informed of their freedom to choose among providers that offer the needed level of care, that participate in Medicare, Medicaid or managed care program needed by the patient, have an available bed and are willing to accept the patient.  01/04/2014  Patient/family informed of [facility]' ownership interest in [facility], as well as of the fact that they are under no obligation to receive care at this facility.  PASARR submitted to EDS on 01/04/2014 PASARR number received on 01/04/2014  FL2 transmitted to all facilities in geographic area requested by pt/family on  01/04/2014 FL2 transmitted to all facilities within larger geographic area on   Patient informed that his/her managed care company has contracts with or will negotiate with  certain facilities, including the following:     Patient/family informed of bed offers received:  01/04/2014 Patient chooses bed at [facility] Physician recommends and patient chooses bed at    Patient to be transferred to [facility]  on  01/07/2014 Patient to be transferred to facility by [facility] Patient and family notified of transfer on 01/07/2014 Name of family member notified:  Mrs. Cruthers (pt wife)   The following  physician request were entered in Epic: Physician Request  Please sign FL2.    Additional CommentsSharol Harness, LCSWA [phone]

## 2014-01-04 NOTE — Progress Notes (Signed)
UR Completed Treana Lacour Graves-Bigelow, RN,BSN [phone]  

## 2014-01-04 NOTE — Progress Notes (Addendum)
[facility] PROGRESS NOTE  [name] Phillips [id number]:[id number] DOB: 13-Oct-1955 DOA: 01/02/2014 PCP: Pola Corn, MD Interim summary: 58 yr old male with prior h/o seizures, and CVA, etoh abuse, came in for altered mental status. He was admitted to telemetry for further recommendations.   Assessment/Plan: 1. SYNCOPE:  - possibly secondary to seizure activity vs etoh intoxication or both.  - on dilantin and CIWA protocol.  - watch for withdrawal symptoms.   Hypokalemia:  will repleted as needed.   Accelerated hypertension: - probably a combination of missing his meds and withdrawal symptoms. Resume his home medications .   Abnormal UA: He denies any dysuria symptoms. Will get urine culture.   Non ischemic cardiomyopathy: Resume home medications . He denies any chest pain or sob.   DVT prophylaxis.    Code Status: fullc oe.  Family Communication: wife at bedside Disposition Plan: SNF when bed available.    Consultants:  NEUROLOGY  Procedures:  EEG  Antibiotics:  none  HPI/Subjective: Tongue pain.   Objective: Filed Vitals:   01/04/14 1444  BP: 120/85  Pulse: 99  Temp: 97.5 F (36.4 C)  Resp: 19    Intake/Output Summary (Last 24 hours) at 01/04/14 1942 Last data filed at 01/04/14 0455  Gross per 24 hour  Intake      0 ml  Output   1800 ml  Net  -1800 ml   Filed Weights   01/02/14 0656 01/04/14 0453  Weight: 75.751 kg (167 lb) 75.978 kg (167 lb 8 oz)    Exam:   General:  Alert and oriented.   Cardiovascular: normal s1s2 , no mumers , rubs or gallops  Respiratory: chest clear to auscultation, no wheezing or rhonchi  Abdomen: soft non tender non distended, bowel sounds heard  Musculoskeletal: no pedal edema  Data Reviewed: Basic Metabolic Panel:  Recent Labs Lab 01/02/14 0412 01/03/14 0332 01/04/14 0449  NA 146 144 139  K 3.8 3.5* 3.7  CL 105 101 96  CO2 23 23 25   GLUCOSE 106* 76 117*  BUN 10 11 12   CREATININE 1.45* 1.06  0.93  CALCIUM 8.7 9.1 10.0   Liver Function Tests:  Recent Labs Lab 01/02/14 0412 01/03/14 0332  AST 108* 88*  ALT 83* 76*  ALKPHOS 109 113  BILITOT 0.4 0.8  PROT 6.6 6.9  ALBUMIN 3.5 3.6   No results found for this basename: LIPASE, AMYLASE,  in the last 168 hours No results found for this basename: AMMONIA,  in the last 168 hours CBC:  Recent Labs Lab 01/02/14 0412 01/03/14 0332  WBC 5.7 3.4*  NEUTROABS 3.4  --   HGB 10.8* 10.9*  HCT 32.3* 32.5*  MCV 93.4 92.3  PLT 126* 115*   Cardiac Enzymes:  Recent Labs Lab 01/02/14 0412  TROPONINI <0.30   BNP (last 3 results)  Recent Labs  10/27/13 2038  PROBNP 692.6*   CBG:  Recent Labs Lab 01/04/14 0734 01/04/14 1122 01/04/14 1637  GLUCAP 104* 149* 128*    No results found for this or any previous visit (from the past 240 hour(s)).   Studies: No results found.  Scheduled Meds: . amLODipine  10 mg Oral Daily  . aspirin  325 mg Oral Daily  . atorvastatin  20 mg Oral q1800  . clopidogrel  75 mg Oral Q breakfast  . enoxaparin (LOVENOX) injection  40 mg Subcutaneous Daily  . LORazepam  0-4 mg Oral Q12H  . multivitamin with minerals  1 tablet Oral Daily  .  pantoprazole  40 mg Oral Daily  . phenytoin  300 mg Oral QHS  . ramipril  20 mg Oral Daily  . sodium chloride  3 mL Intravenous Q12H   Continuous Infusions: . dextrose      Active Problems:   Tobacco abuse   Systolic CHF   Syncope   Loss of consciousness   Seizure   Hypothermia   Alcohol intoxication   Cardiomyopathy, nonischemic   Hypotension    Time spent: 25 minutes.     [facility]  [facility] Pager [phone]. If 7PM-7AM, please contact night-coverage at [email or web address], password [facility] 01/04/2014, 7:42 PM  LOS: 2 days

## 2014-01-04 NOTE — Evaluation (Signed)
Occupational Therapy Evaluation Patient Details Name: [name] Phillips MRN: [id number] DOB: March 06, 1956 Today's Date: 01/04/2014    History of Present Illness 58 yo M With breakthrough seizure in the setting of medication non-compliance.   Clinical Impression   Pt currently with functional limitations due to the deficits listed below (see OT Problem List). Per wife, patient with h/o CVA ~3 yrs ago and has h/o drinking resulting in 3-4 falls per day.  Patient is extremely hight fall risk and will benefit from skilled OT to increase their safety and independence with ADL and functional mobility for ADL to facilitate discharge to venue listed below. Wife agrees with recommendation for SNF vs Inpatient rehab stay before going home secondary to she is a "heart patient" and would need to rely on family who also live in the home to assist her.  Wife reports that patient was able to perform all BADLs without assistance as long as he was not drinking.     Follow Up Recommendations  CIR;SNF;Supervision/Assistance - 24 hour    Equipment Recommendations   (TBD at next venue)    Precautions / Restrictions Precautions Precautions: Fall Restrictions Weight Bearing Restrictions: No      Mobility Bed Mobility Overal bed mobility: Needs Assistance Bed Mobility: Supine to Sit;Sit to Supine     Supine to sit: Min assist;Mod assist Sit to supine: Min assist   General bed mobility comments: patient demonstrates poor awareness of positioning or sequencing for coming to EOB or returning to bed.  Cognitive function and lethargy limiting physical ability  Transfers General transfer comment: Transfers not attempted due to lethargy, per PT eval 8/10, patient Mod assist for sit><stand and +2    Balance Overall balance assessment: Needs assistance Sitting-balance support: Feet supported Sitting balance-Leahy Scale: Poor Sitting balance - Comments: patient with very significant posterior bias, falling back  onto bed with static sitting and with any dynamic movement Postural control: Posterior lean;Left lateral lean       ADL Overall ADL's : Needs assistance/impaired     Grooming: Wash/dry hands;Wash/dry face;Oral care;Moderate assistance;Sitting;Cueing for sequencing Grooming Details (indicate cue type and reason): Sitting EOB, decreased initiation, min-mod assist with sitting balance and problem solving     Lower Body Dressing: Maximal assistance Lower Body Dressing Details (indicate cue type and reason): Attempted to donn and doff socks, was able to doff right sock with increased time and positioning assistance     General ADL Comments: Wife present during OT session and providing prior level of function information.  Patient able to sit EOB with cues, mod assist with scooting, min-mod with sitting balance.  Patient lethargic, mumbling and poor initiation and problem solving.  Patient lethargic and hard time staying awake at times.     Vision  Wears Bifocal glasses, Vision to be assessed further.     Pertinent Vitals/Pain  Report "legs hurt", not rated.  Per wife, thinks this is from fall at home.  Rest and repositioned.  RN aware     Hand Dominance Right (wife reports that patient was always, "even handed")   Extremity/Trunk Assessment Upper Extremity Assessment Upper Extremity Assessment: Generalized weakness   Lower Extremity Assessment Lower Extremity Assessment: RLE deficits/detail RLE Deficits / Details: unclear if deficits due to medication, will continue to assess RLE Coordination: decreased fine motor;decreased gross motor   Cervical / Trunk Assessment Cervical / Trunk Assessment: Kyphotic   Communication Communication Communication: Expressive difficulties (mumbles and requires repeating several times)   Cognition Arousal/Alertness: Lethargic;Suspect due to  medications Behavior During Therapy: WFL for tasks assessed/performed (except sleepy and  lethargic) Overall Cognitive Status: History of cognitive impairments - at baseline Area of Impairment: Orientation;Attention;Following commands;Safety/judgement;Awareness;Problem solving Orientation Level: Disoriented to;Time;Situation;Place Current Attention Level: Focused Memory: Decreased short-term memory Following Commands: Follows one step commands inconsistently Safety/Judgement: Decreased awareness of safety;Decreased awareness of deficits   Problem Solving: Slow processing;Decreased initiation;Difficulty sequencing;Requires verbal cues;Requires tactile cues General Comments: patient with poor ability to follow commands and execute tasks, wife reports that this is worse than baseline   General Comments         Home Living Family/patient expects to be discharged to:: Skilled nursing facility (vs. Inpatient Rehab) Living Arrangements: Spouse/significant other Available Help at Discharge: Family Type of Home: House Home Access: Stairs to enter Secretary/administrator of Steps: 1 Entrance Stairs-Rails: None Home Layout: One level       Home Equipment: None   Additional Comments: PTA, patient only took sponge baths.  Tub shower combo and no door secondary to everyone sits in bottom of tub for bath.        Prior Functioning/Environment    Comments: per wife, "he would fall 3-4 times per day when drinking or being out in the heat". She also reports that when he was not drinking he was independent with BADL and ambulating without assistance.    OT Diagnosis: Generalized weakness;Cognitive deficits;Acute pain;Altered mental status;Hemiplegia non-dominant side   OT Problem List: Decreased strength;Decreased range of motion;Impaired balance (sitting and/or standing);Decreased activity tolerance;Decreased coordination;Decreased cognition;Decreased safety awareness;Impaired UE functional use;Pain   OT Treatment/Interventions: Self-care/ADL training;Therapeutic  exercise;Neuromuscular education;Therapeutic activities;Cognitive remediation/compensation;Balance training;Patient/family education    OT Goals(Current goals can be found in the care plan section) Acute Rehab OT Goals Patient Stated Goal: none stated by pt, wife states that she needs him to be safer with mobility OT Goal Formulation: With family Time For Goal Achievement: 01/18/14 Potential to Achieve Goals: Good ADL Goals Pt Will Perform Grooming: with supervision;standing Pt Will Perform Upper Body Bathing: with set-up;standing Pt Will Perform Lower Body Bathing: with min assist;sit to/from stand Pt Will Perform Lower Body Dressing: with min assist;sit to/from stand Pt Will Transfer to Toilet: with supervision;grab bars;ambulating Pt Will Perform Toileting - Clothing Manipulation and hygiene: with supervision;sit to/from stand  OT Frequency: Min 2X/week   Barriers to D/C: Decreased caregiver support  Wife is disabled as a heart patient    End of Session Nurse Communication: Mobility status;Other (comment) (patient reports pain in legs.)  Activity Tolerance: Patient limited by lethargy Patient left: in bed;with call bell/phone within reach;with bed alarm set;with restraints reapplied (Bilateral mits)   Time: 1610-9604 OT Time Calculation (min): 45 min Charges:  OT General Charges $OT Visit: 1 Procedure OT Evaluation $Initial OT Evaluation Tier I: 1 Procedure OT Treatments $Self Care/Home Management : 23-37 mins G-Codes:    Marik Sedore January 15, 2014, 9:50 AM

## 2014-01-05 ENCOUNTER — Inpatient Hospital Stay (HOSPITAL_COMMUNITY): Payer: Medicare Other

## 2014-01-05 DIAGNOSIS — I428 Other cardiomyopathies: Secondary | ICD-10-CM

## 2014-01-05 LAB — CBC
HCT: 40.6 % (ref 39.0–52.0)
Hemoglobin: 13.8 g/dL (ref 13.0–17.0)
MCH: 31.5 pg (ref 26.0–34.0)
MCHC: 34 g/dL (ref 30.0–36.0)
MCV: 92.7 fL (ref 78.0–100.0)
Platelets: 96 10*3/uL — ABNORMAL LOW (ref 150–400)
RBC: 4.38 MIL/uL (ref 4.22–5.81)
RDW: 15.2 % (ref 11.5–15.5)
WBC: 5 10*3/uL (ref 4.0–10.5)

## 2014-01-05 LAB — URINE CULTURE
Colony Count: NO GROWTH
Culture: NO GROWTH

## 2014-01-05 LAB — PHENYTOIN LEVEL, TOTAL: Phenytoin Lvl: 11.2 ug/mL (ref 10.0–20.0)

## 2014-01-05 LAB — GLUCOSE, CAPILLARY
Glucose-Capillary: 123 mg/dL — ABNORMAL HIGH (ref 70–99)
Glucose-Capillary: 147 mg/dL — ABNORMAL HIGH (ref 70–99)
Glucose-Capillary: 105 mg/dL — ABNORMAL HIGH (ref 70–99)
Glucose-Capillary: 127 mg/dL — ABNORMAL HIGH (ref 70–99)

## 2014-01-05 MED ORDER — LORAZEPAM 1 MG PO TABS: 1.0000 mg | ORAL_TABLET | Freq: Four times a day (QID) | ORAL | Status: DC | PRN

## 2014-01-05 MED ORDER — VITAMIN B-1 100 MG PO TABS
100.0000 mg | ORAL_TABLET | Freq: Every day | ORAL | Status: DC
  Administered 2014-01-05 – 2014-01-07 (×3): 100 mg via ORAL
  Filled 2014-01-05 (×3): qty 1

## 2014-01-05 MED ORDER — HYDRALAZINE HCL 20 MG/ML IJ SOLN
5.0000 mg | Freq: Once | INTRAMUSCULAR | Status: AC
  Administered 2014-01-05: 5 mg via INTRAVENOUS
  Filled 2014-01-05: qty 1

## 2014-01-05 MED ORDER — FOLIC ACID 1 MG PO TABS
1.0000 mg | ORAL_TABLET | Freq: Every day | ORAL | Status: DC
  Administered 2014-01-05 – 2014-01-07 (×3): 1 mg via ORAL
  Filled 2014-01-05 (×3): qty 1

## 2014-01-05 MED ORDER — LORAZEPAM 2 MG/ML IJ SOLN
2.0000 mg | Freq: Once | INTRAMUSCULAR | Status: AC
  Administered 2014-01-05: 2 mg via INTRAVENOUS
  Filled 2014-01-05: qty 1

## 2014-01-05 MED ORDER — LORAZEPAM 2 MG/ML IJ SOLN: 1.0000 mg | Freq: Four times a day (QID) | INTRAMUSCULAR | Status: DC | PRN

## 2014-01-05 NOTE — Progress Notes (Signed)
Physical Therapy Treatment Patient Details Name: [name] Phillips MRN: [id number] DOB: 1955/06/01 Today's Date: 01/05/2014    History of Present Illness 58 yo M With breakthrough seizure in the setting of medication non-compliance.    PT Comments    Patient with some modest improvements in mobility but continues to require +2 increase assist to prevent fall. Patient with increased pain in LUE with activity and tender to touch. Covering nurse notified.  Follow Up Recommendations  SNF;Supervision/Assistance - 24 hour     Equipment Recommendations  Rolling walker with 5" wheels    Recommendations for Other Services       Precautions / Restrictions Precautions Precautions: Fall Restrictions Weight Bearing Restrictions: No    Mobility  Bed Mobility Overal bed mobility: Needs Assistance Bed Mobility: Supine to Sit;Sit to Supine     Supine to sit: Min assist;Mod assist Sit to supine: Min guard   General bed mobility comments: Assist for stability and posterior support to come to upright at EOB  Transfers Overall transfer level: Needs assistance Equipment used: Rolling walker (2 wheeled) Transfers: Sit to/from Stand Sit to Stand: Min assist;+2 safety/equipment         General transfer comment: VCs for hand placement and safety, assist for stability  Ambulation/Gait Ambulation/Gait assistance: Min assist;Mod assist Ambulation Distance (Feet): 180 Feet (80 ft with RW, 100 ft with +2 HHA) Assistive device: Rolling walker (2 wheeled);2 person hand held assist Gait Pattern/deviations: Ataxic;Staggering left;Staggering right;Trunk flexed;Narrow base of support Gait velocity: decreased Gait velocity interpretation: Below normal speed for age/gender General Gait Details: patient with some modest improvements in stability but continues to require increased assist with multiple LOB. Patient with difficulty using RW today, poor safety and positioning, converted to HHA (+2), patient  tolerated well but continue to demonstrate some LOB to the left.   Stairs            Wheelchair Mobility    Modified Rankin (Stroke Patients Only)       Balance   Sitting-balance support: Feet supported Sitting balance-Leahy Scale: Fair Sitting balance - Comments: continues to have posterior bias in EOB activity but able to self support for several seconds today Postural control: Posterior lean;Left lateral lean Standing balance support: Bilateral upper extremity supported Standing balance-Leahy Scale: Poor                      Cognition Arousal/Alertness: Awake/alert Behavior During Therapy: WFL for tasks assessed/performed Overall Cognitive Status: History of cognitive impairments - at baseline     Current Attention Level: Sustained Memory: Decreased short-term memory   Safety/Judgement: Decreased awareness of safety;Decreased awareness of deficits   Problem Solving: Slow processing;Decreased initiation;Difficulty sequencing;Requires verbal cues;Requires tactile cues      Exercises      General Comments        Pertinent Vitals/Pain Pain Assessment: Faces Pain Score: 8  Faces Pain Scale: Hurts whole lot Pain Location: Left shoulder Pain Descriptors / Indicators: Grimacing;Sharp Pain Intervention(s): Limited activity within patient's tolerance;Monitored during session;Repositioned;Other (comment) (nsg notified)    Home Living                      Prior Function            PT Goals (current goals can now be found in the care plan section) Acute Rehab PT Goals PT Goal Formulation: With patient/family Time For Goal Achievement: 01/17/14 Potential to Achieve Goals: Fair Progress towards PT goals: Progressing toward goals  Frequency  Min 3X/week    PT Plan Current plan remains appropriate    Co-evaluation             End of Session Equipment Utilized During Treatment: Gait belt Activity Tolerance: Patient tolerated  treatment well Patient left: in bed;with call bell/phone within reach;with bed alarm set;with family/visitor present     Time: 1409-1430 PT Time Calculation (min): 21 min  Charges:  $Gait Training: 8-22 mins                    G CodesFabio Asa 02-02-14, 4:37 PM Charlotte Crumb, PT DPT  [phone]

## 2014-01-05 NOTE — Progress Notes (Signed)
[facility] PROGRESS NOTE  [name] Phillips ZOX:[id number] DOB: January 11, 1956 DOA: 01/02/2014 PCP: Pola Corn, MD Interim summary: 58 yr old male with prior h/o seizures, and CVA, etoh abuse, came in for altered mental status. He was admitted to telemetry for further recommendations. His wife states he had a prolonged grand mal seizure at home which she witnessed herself. He was admitted in June for syncope - it was determined at that time that he likely did not have a seizure.  HPI/Subjective: Drowsy but oriented. No complaints.   Assessment/Plan: Seizure vs Syncope: - appears to have had seizure activity per his wife .  - Neuro has started him back on dilantin which his wife states was discontinued about 1 yr ago.   - obtained Dilantin level today- therapeutic   ETOH abuse - drinks bear,  Moonshine and other liquor which his friends bring to him (per wife) - on CIWA scale for withdrawal symptoms- follow  Elevated LFTs - likely related to ETOH use - Fatty liver on CT in 2013  Hypokalemia:  repleted   Accelerated hypertension: - probably a combination of missing his meds and withdrawal symptoms. -  Resumed home medications - BP stable  Abnormal UA: He denies any dysuria symptoms-  urine culture negative  Non ischemic cardiomyopathy: Resume home medications . He denies any chest pain or sob.  Thrombocytopenia - appears chronic- likely related to ETOH use  AICD  SVT  - during last hospital stay  DVT prophylaxis: lovenox Code Status: full code.  Family Communication: wife at bedside Disposition Plan: SNF when no longer in withdrawl    Consultants:  NEUROLOGY  Procedures:  EEG  Antibiotics:  none    Objective: Filed Vitals:   01/05/14 1355  BP: 103/78  Pulse: 99  Temp: 98.2 F (36.8 C)  Resp:     Intake/Output Summary (Last 24 hours) at 01/05/14 1636 Last data filed at 01/05/14 0446  Gross per 24 hour  Intake    200 ml  Output    325 ml   Net   -125 ml   Filed Weights   01/02/14 0656 01/04/14 0453 01/05/14 0444  Weight: 75.751 kg (167 lb) 75.978 kg (167 lb 8 oz) 75.615 kg (166 lb 11.2 oz)    Exam:   General: sleepy-  oriented.   Cardiovascular: normal s1s2 , no mumers , rubs or gallops  Respiratory: chest clear to auscultation, no wheezing or rhonchi  Abdomen: soft non tender non distended, bowel sounds heard  Musculoskeletal: no pedal edema  Data Reviewed: Basic Metabolic Panel:  Recent Labs Lab 01/02/14 0412 01/03/14 0332 01/04/14 0449  NA 146 144 139  K 3.8 3.5* 3.7  CL 105 101 96  CO2 23 23 25   GLUCOSE 106* 76 117*  BUN 10 11 12   CREATININE 1.45* 1.06 0.93  CALCIUM 8.7 9.1 10.0   Liver Function Tests:  Recent Labs Lab 01/02/14 0412 01/03/14 0332  AST 108* 88*  ALT 83* 76*  ALKPHOS 109 113  BILITOT 0.4 0.8  PROT 6.6 6.9  ALBUMIN 3.5 3.6   No results found for this basename: LIPASE, AMYLASE,  in the last 168 hours No results found for this basename: AMMONIA,  in the last 168 hours CBC:  Recent Labs Lab 01/02/14 0412 01/03/14 0332 01/05/14 1220  WBC 5.7 3.4* 5.0  NEUTROABS 3.4  --   --   HGB 10.8* 10.9* 13.8  HCT 32.3* 32.5* 40.6  MCV 93.4 92.3 92.7  PLT 126* 115* 96*  Cardiac Enzymes:  Recent Labs Lab 01/02/14 0412  TROPONINI <0.30   BNP (last 3 results)  Recent Labs  10/27/13 2038  PROBNP 692.6*   CBG:  Recent Labs Lab 01/04/14 1122 01/04/14 1637 01/04/14 1941 01/05/14 0725 01/05/14 1114  GLUCAP 149* 128* 130* 123* 147*    Recent Results (from the past 240 hour(s))  URINE CULTURE     Status: None   Collection Time    01/02/14  6:24 AM      Result Value Ref Range Status   Specimen Description URINE, RANDOM   Final   Special Requests NONE   Final   Culture  Setup Time     Final   Value: 01/04/2014 04:28     Performed at [facility] Count     Final   Value: NO GROWTH     Performed at [facility]   Culture     Final    Value: NO GROWTH     Performed at [facility]   Report Status 01/05/2014 FINAL   Final     Studies: No results found.  Scheduled Meds: . amLODipine  10 mg Oral Daily  . aspirin  325 mg Oral Daily  . atorvastatin  20 mg Oral q1800  . carvedilol  25 mg Oral BID WC  . clopidogrel  75 mg Oral Q breakfast  . enoxaparin (LOVENOX) injection  40 mg Subcutaneous Daily  . LORazepam  0-4 mg Oral Q12H  . multivitamin with minerals  1 tablet Oral Daily  . pantoprazole  40 mg Oral Daily  . phenytoin  300 mg Oral QHS  . ramipril  20 mg Oral Daily  . sodium chloride  3 mL Intravenous Q12H   Continuous Infusions: . dextrose       Time spent: 25 minutes.     Calvert Cantor, MD [facility] [email or web address], password [facility] 01/05/2014, 4:36 PM  LOS: 3 days

## 2014-01-05 NOTE — Progress Notes (Signed)
Patient blood pressure at about 0459 154/109, heart rate 122, [facility] paged at [phone], new order received for x1 IVP hydralazine 5mg , nurse administered per order.  Blood pressure rechecked at about 0626 and was 152/105, heart rate 111, on call for [facility] paged to update at [phone], no return call received.  This information passed on to patient's day shift RN.

## 2014-01-05 NOTE — Progress Notes (Signed)
During Physical therapy session, Patient with reports of significant shoulder pain (L).  Patient did come in with reported fall per wife.  L shoulder painful to touch and patient with visible wincing and loud verbalization of pain when asked to return to bed via left side-lying.  (nsg made aware). Full progress note from session to follow.   Charlotte Crumb, PT DPT  [phone]

## 2014-01-06 ENCOUNTER — Inpatient Hospital Stay (HOSPITAL_COMMUNITY): Payer: Medicare Other

## 2014-01-06 LAB — GLUCOSE, CAPILLARY
Glucose-Capillary: 101 mg/dL — ABNORMAL HIGH (ref 70–99)
Glucose-Capillary: 128 mg/dL — ABNORMAL HIGH (ref 70–99)
Glucose-Capillary: 138 mg/dL — ABNORMAL HIGH (ref 70–99)
Glucose-Capillary: 150 mg/dL — ABNORMAL HIGH (ref 70–99)

## 2014-01-06 MED ORDER — MENTHOL 3 MG MT LOZG
1.0000 | LOZENGE | OROMUCOSAL | Status: DC | PRN
  Filled 2014-01-06 (×2): qty 9

## 2014-01-06 MED ORDER — LORAZEPAM 2 MG/ML IJ SOLN
2.0000 mg | Freq: Once | INTRAMUSCULAR | Status: AC
  Administered 2014-01-06: 2 mg via INTRAVENOUS
  Filled 2014-01-06: qty 1

## 2014-01-06 NOTE — Progress Notes (Signed)
Physical Therapy Treatment Patient Details Name: GARVEY REFFETT MRN: [id number] DOB: March 25, 1956 Today's Date: 01/06/2014    History of Present Illness 58 yo M With breakthrough seizure in the setting of medication non-compliance with fall onto left    PT Comments    Pt reporting seeing spiders in the chair, believes he brushed his teeth x 2 today (has not) and continued garbled speech throughout. Pt oriented to self and place with decreased cognition, balance and mobility. At sink pt needed mod assist to complete task of brushing teeth as under shooting brush and sink handle with cues for sequence. Pt with new report of left hip pain and RN as well as wife aware. Pt cued for lack of use of LUE given imaging with glenoid avulsion although no clear orders as of yet. Will continue to follow to maximize mobility and function.   Follow Up Recommendations  SNF;Supervision/Assistance - 24 hour     Equipment Recommendations       Recommendations for Other Services       Precautions / Restrictions Precautions Precautions: Fall    Mobility  Bed Mobility Overal bed mobility: Needs Assistance Bed Mobility: Supine to Sit     Supine to sit: Min assist     General bed mobility comments: cues for sequence and assist for scooting hips to EOB and use of RUE only to bring trunk to sitting given Left glenoid avulsion  Transfers Overall transfer level: Needs assistance   Transfers: Sit to/from Stand Sit to Stand: Min assist;Mod assist;+2 safety/equipment         General transfer comment: cues for safety, hand placement and stability with min assist to stand and mod assist +2 to sit in chair as pt turning and trying to sit away from chair  Ambulation/Gait Ambulation/Gait assistance: Min assist;+2 safety/equipment Ambulation Distance (Feet): 250 Feet Assistive device: 1 person hand held assist Gait Pattern/deviations: Step-through pattern;Narrow base of support;Decreased stride length    Gait velocity interpretation: Below normal speed for age/gender General Gait Details: chair followed for safety with cues for turning, direction but overall improved stabilty on his feet with increased distance and reported no change in hip pain with gait   Stairs            Wheelchair Mobility    Modified Rankin (Stroke Patients Only)       Balance Overall balance assessment: Needs assistance;History of Falls Sitting-balance support: Feet supported Sitting balance-Leahy Scale: Fair       Standing balance-Leahy Scale: Poor                      Cognition Arousal/Alertness: Awake/alert Behavior During Therapy: Flat affect Overall Cognitive Status: Impaired/Different from baseline Area of Impairment: Orientation;Attention;Memory;Problem solving;Following commands;Safety/judgement;Awareness Orientation Level: Disoriented to;Time;Situation Current Attention Level: Sustained Memory: Decreased short-term memory Following Commands: Follows one step commands inconsistently;Follows one step commands with increased time Safety/Judgement: Decreased awareness of safety;Decreased awareness of deficits   Problem Solving: Slow processing;Decreased initiation;Requires verbal cues;Requires tactile cues      Exercises      General Comments        Pertinent Vitals/Pain Pain Assessment: Faces Faces Pain Scale: Hurts little more Pain Location: left hip Pain Descriptors / Indicators: Aching Pain Intervention(s): Limited activity within patient's tolerance;Repositioned    Home Living                      Prior Function  PT Goals (current goals can now be found in the care plan section) Progress towards PT goals: Progressing toward goals    Frequency       PT Plan Current plan remains appropriate    Co-evaluation             End of Session Equipment Utilized During Treatment: Gait belt Activity Tolerance: Patient tolerated treatment  well Patient left: in chair;with call bell/phone within reach;with chair alarm set;with family/visitor present     Time: 1610-9604 PT Time Calculation (min): 28 min  Charges:  $Gait Training: 8-22 mins $Therapeutic Activity: 8-22 mins                    G Codes:      Delorse Lek Jan 19, 2014, 10:07 AM Delaney Meigs, PT [phone]

## 2014-01-06 NOTE — Progress Notes (Signed)
Speech Language Pathology Treatment: Dysphagia  Patient Details Name: [name] Phillips MRN: [id number] DOB: 08/24/1955 Today's Date: 01/06/2014 Time: 8921-1941 SLP Time Calculation (min): 15 min  Assessment / Plan / Recommendation Clinical Impression  Pt demonstrates lethargy and confusion that continue to necessitate full supervision and soft meal textures. SLP had to provide moderate verbal and tactile cues to sustain pts attention to PO intake. No evidence of aspiration seen, but pt was noted to have prolonged audible swallow, often consistent with cervical esophageal dysphagia. Reviewed precautions with pts wife, particularly need for upright posture and full supervision for meals. She verbalized understanding.    HPI HPI: 58 yo M With breakthrough seizure in the setting of medication non-compliance. PMH significant for CVA. Wife denies h/o dysphagia, although does report that he coughs at home particularly when food is very dry or when he tries to drink while lying down. CT Head 8/9 indicates "large anterior osteophytes seen along the cervical spine."   Pertinent Vitals Pain Assessment: Faces Faces Pain Scale: Hurts little more Pain Location: left hip Pain Descriptors / Indicators: Aching Pain Intervention(s): Limited activity within patient's tolerance;Repositioned  SLP Plan  Continue with current plan of care    Recommendations Diet recommendations: Dysphagia 3 (mechanical soft);Thin liquid Liquids provided via: Cup;Straw Medication Administration: Whole meds with puree Supervision: Full supervision/cueing for compensatory strategies Compensations: Slow rate;Small sips/bites;Check for pocketing Postural Changes and/or Swallow Maneuvers: Seated upright 90 degrees;Upright 30-60 min after meal              Oral Care Recommendations: Oral care BID Follow up Recommendations: Skilled Nursing facility;24 hour supervision/assistance Plan: Continue with current plan of care    GO     [facility], MA CCC-SLP [phone]  Claudine Mouton 01/06/2014, 1:46 PM

## 2014-01-06 NOTE — Progress Notes (Signed)
Pt became agitated trying to get out of bed. When redirected pt became combative, swinging at me and his wife. Pt stated he was "trying to get that cigarette" and began trying to pick something off the bedside table that was not there. Pt was guided back to bed. CIWA scored 12 at this time, MD on call notified. 2mg  IV Ativan ordered. Pt's RN made aware. Will continue to monitor pt.   [name] Phillips, Charity fundraiser

## 2014-01-06 NOTE — Progress Notes (Signed)
[facility] PROGRESS NOTE  [name] Phillips ZOX:[id number] DOB: 1955/09/14 DOA: 01/02/2014 PCP: Pola Corn, MD Interim summary: 58 yr old male with prior h/o seizures, and CVA, etoh abuse, came in for altered mental status. He was admitted to telemetry for further recommendations. His wife states he had a prolonged grand mal seizure at home which she witnessed herself. He was admitted in June for syncope - it was determined at that time that he likely did not have a seizure.  HPI/Subjective: Alert and sitting up in a chair. No complaints- mildly confused  Assessment/Plan: Seizure vs Syncope: - appears to have had seizure activity per his wife .  - Neuro has started him back on dilantin which his wife states was discontinued about 1 yr ago.   - obtained Dilantin level today- therapeutic   ETOH abuse - drinks bear,  Moonshine and other liquor which his friends bring to him (per wife) - on CIWA scale for withdrawal symptoms- required more Ativan over night - cont to follow  Elevated LFTs - likely related to ETOH use - Fatty liver on CT in 2013  Hypokalemia:  repleted   Accelerated hypertension: - probably a combination of missing his meds and withdrawal symptoms. -  Resumed home medications - BP stable  Abnormal UA: He denies any dysuria symptoms-  urine culture negative  Non ischemic cardiomyopathy: Resume home medications . He denies any chest pain or sob.  Thrombocytopenia - appears chronic- likely related to ETOH use  AICD  SVT  - during last hospital stay  DVT prophylaxis: lovenox Code Status: full code.  Family Communication: wife at bedside Disposition Plan: SNF when no longer in withdrawl    Consultants:  NEUROLOGY  Procedures:  EEG  Antibiotics:  none    Objective: Filed Vitals:   01/06/14 1158  BP: 94/73  Pulse: 108  Temp: 98.8 F (37.1 C)  Resp: 19    Intake/Output Summary (Last 24 hours) at 01/06/14 1501 Last data filed at  01/06/14 0545  Gross per 24 hour  Intake    100 ml  Output    300 ml  Net   -200 ml   Filed Weights   01/04/14 0453 01/05/14 0444 01/06/14 0611  Weight: 75.978 kg (167 lb 8 oz) 75.615 kg (166 lb 11.2 oz) 73.71 kg (162 lb 8 oz)    Exam:   General: sleepy-  Alert and sitting up in a chair  Cardiovascular: normal s1s2 , no mumers , rubs or gallops  Respiratory: chest clear to auscultation, no wheezing or rhonchi  Abdomen: soft non tender non distended, bowel sounds heard  Musculoskeletal: no pedal edema  Data Reviewed: Basic Metabolic Panel:  Recent Labs Lab 01/02/14 0412 01/03/14 0332 01/04/14 0449  NA 146 144 139  K 3.8 3.5* 3.7  CL 105 101 96  CO2 23 23 25   GLUCOSE 106* 76 117*  BUN 10 11 12   CREATININE 1.45* 1.06 0.93  CALCIUM 8.7 9.1 10.0   Liver Function Tests:  Recent Labs Lab 01/02/14 0412 01/03/14 0332  AST 108* 88*  ALT 83* 76*  ALKPHOS 109 113  BILITOT 0.4 0.8  PROT 6.6 6.9  ALBUMIN 3.5 3.6   No results found for this basename: LIPASE, AMYLASE,  in the last 168 hours No results found for this basename: AMMONIA,  in the last 168 hours CBC:  Recent Labs Lab 01/02/14 0412 01/03/14 0332 01/05/14 1220  WBC 5.7 3.4* 5.0  NEUTROABS 3.4  --   --  HGB 10.8* 10.9* 13.8  HCT 32.3* 32.5* 40.6  MCV 93.4 92.3 92.7  PLT 126* 115* 96*   Cardiac Enzymes:  Recent Labs Lab 01/02/14 0412  TROPONINI <0.30   BNP (last 3 results)  Recent Labs  10/27/13 2038  PROBNP 692.6*   CBG:  Recent Labs Lab 01/05/14 1114 01/05/14 1634 01/05/14 2048 01/06/14 0730 01/06/14 1151  GLUCAP 147* 105* 127* 101* 128*    Recent Results (from the past 240 hour(s))  URINE CULTURE     Status: None   Collection Time    01/02/14  6:24 AM      Result Value Ref Range Status   Specimen Description URINE, RANDOM   Final   Special Requests NONE   Final   Culture  Setup Time     Final   Value: 01/04/2014 04:28     Performed at [facility]  Count     Final   Value: NO GROWTH     Performed at [facility]   Culture     Final   Value: NO GROWTH     Performed at [facility]   Report Status 01/05/2014 FINAL   Final     Studies: Dg Shoulder Left  01/05/2014   CLINICAL DATA:  Proximal humerus pain secondary to a fall 3 days ago. History of 2 prior dislocations.  EXAM: LEFT SHOULDER - 2+ VIEW  COMPARISON:  None.  FINDINGS: The visualized portion of the humerus is intact. There appears to be an avulsion of the posterior rim of the glenoid. This may not be acute. There are slight degenerative changes of the acromioclavicular joint with slight osteophyte formation on the acromion. No dislocation.  IMPRESSION: Avulsion of the posterior aspect of the glenoid which may not be acute. No dislocation.   Electronically Signed   By: Geanie Cooley M.D.   On: 01/05/2014 20:25    Scheduled Meds: . amLODipine  10 mg Oral Daily  . aspirin  325 mg Oral Daily  . atorvastatin  20 mg Oral q1800  . carvedilol  25 mg Oral BID WC  . clopidogrel  75 mg Oral Q breakfast  . enoxaparin (LOVENOX) injection  40 mg Subcutaneous Daily  . folic acid  1 mg Oral Daily  . multivitamin with minerals  1 tablet Oral Daily  . pantoprazole  40 mg Oral Daily  . phenytoin  300 mg Oral QHS  . ramipril  20 mg Oral Daily  . sodium chloride  3 mL Intravenous Q12H  . thiamine  100 mg Oral Daily   Continuous Infusions:     Time spent: 25 minutes.     Calvert Cantor, MD [facility] [email or web address], password [facility] 01/06/2014, 3:01 PM  LOS: 4 days

## 2014-01-06 NOTE — Progress Notes (Signed)
Pt CIWA 13 for mild headache, hallucinations, increased restlessness, some sweating, very mild tremor, and mild anxiety.  MD on call, K. Schorr, notified and 2mg  Ativan IV ordered and given.  Pt alert to self and place but not to time.  Pt orientation fluctuates frequently.  Sometimes he is only able to say that he is in [place] but is unable to say he is in a hospital.  Pt denies need to go to the bathroom or feeling too hot/too cold.  Pt reports seeing someone by the window but denies hearing any voices.  Will continue to monitor.

## 2014-01-07 DIAGNOSIS — F10931 Alcohol use, unspecified with withdrawal delirium: Secondary | ICD-10-CM | POA: Diagnosis present

## 2014-01-07 DIAGNOSIS — Z95810 Presence of automatic (implantable) cardiac defibrillator: Secondary | ICD-10-CM

## 2014-01-07 DIAGNOSIS — R569 Unspecified convulsions: Secondary | ICD-10-CM

## 2014-01-07 DIAGNOSIS — F10231 Alcohol dependence with withdrawal delirium: Secondary | ICD-10-CM

## 2014-01-07 LAB — GLUCOSE, CAPILLARY
Glucose-Capillary: 103 mg/dL — ABNORMAL HIGH (ref 70–99)
Glucose-Capillary: 100 mg/dL — ABNORMAL HIGH (ref 70–99)

## 2014-01-07 MED ORDER — MENTHOL 3 MG MT LOZG: 1.0000 | LOZENGE | OROMUCOSAL | Status: DC | PRN

## 2014-01-07 MED ORDER — KETOROLAC TROMETHAMINE 30 MG/ML IJ SOLN
30.0000 mg | Freq: Once | INTRAMUSCULAR | Status: AC
  Administered 2014-01-07: 30 mg via INTRAVENOUS
  Filled 2014-01-07: qty 1

## 2014-01-07 MED ORDER — OXYCODONE HCL 5 MG PO TABS
5.0000 mg | ORAL_TABLET | Freq: Once | ORAL | Status: AC
  Administered 2014-01-07: 5 mg via ORAL
  Filled 2014-01-07: qty 1

## 2014-01-07 NOTE — Progress Notes (Signed)
CSW (Clinical Social Worker) prepared pt dc packet and placed with shadow chart. CSW arranged non-emergent ambulance transport. Pt, pt family, pt nurse, and facility informed. CSW signing off.  [name] Phillips, LCSWA [phone]  

## 2014-01-07 NOTE — Discharge Summary (Signed)
Physician Discharge Summary  [name] Phillips MVH:[id number] DOB: 1955/09/21 DOA: 01/02/2014  PCP: Pola Corn, MD  Admit date: 01/02/2014 Discharge date: 01/07/2014  Time spent: >45 minutes  Recommendations for Outpatient Follow-up:  1. Periodic Dilantin levels  Discharge Diagnoses:  Principal Problem:   Seizure Active Problems:   Alcohol intoxication   Alcohol withdrawal delirium   Tobacco abuse   HTN (hypertension)   Systolic CHF   AICD (automatic cardioverter/defibrillator) present   Nonischemic cardiomyopathy   Hypothermia   Hypotension   Discharge Condition: stable  Diet recommendation: heart healthy  Filed Weights   01/05/14 0444 01/06/14 0611 01/07/14 0416  Weight: 75.615 kg (166 lb 11.2 oz) 73.71 kg (162 lb 8 oz) 71.759 kg (158 lb 3.2 oz)    History of present illness:  This is a 59 year old male with a past medical history of seizure disorder, nonischemic cardiomyopathy with an EF of 15-20%, AICD, hypertension and a history of CVA. He's also an alcoholic. The patient presented to the hospital on 01/02/14 for suspected syncopal episode with confusion upon waking. It was initially thought that he passed out and he was admitted for syncope, hypotension and hypothermia. Of note his alcohol level on arrival was 320. As he has a history of seizures a neuro consult was obtained as well. After further discussion with his wife it appeared that he had a grand mal seizure at home. She states that she was present and witnessed the entire episode.  Hospital Course:  Seizure.  - Neurology as started him back on dilantin which his wife states was discontinued about 1 yr ago.  - obtained Dilantin level 8/12- therapeutic at 11.2 -Head CT on admission revealed chronic encephalomalacia in the left occipital lobe reflecting a remote infarct and small chronic lacunar infarcts in the basal ganglia bilaterally-it further showed small vessel ischemic) she'll  ETOH abuse  - drinks bear,  Moonshine and other liquor which his friends bring to him (per wife)  - he did go through ETOH withdrawal and was treated with PRN IV Ativan -No longer an alcohol withdrawal and stable to be discharged -I have discussed with him that he does need to stop drinking and he appears to agree with  Elevated LFTs  - likely related to ETOH use  - Fatty liver on CT in 2013   Hypokalemia:  repleted   Accelerated hypertension with initial hypotension on admission:  - probably a combination of missing his meds and withdrawal symptoms.  - Resumed home medications - BP stable   Abnormal UA:  He denies any dysuria symptoms- urine culture negative   Non ischemic cardiomyopathy:  Resume home medications . He denies any chest pain or sob.   Thrombocytopenia  - appears chronic- likely related to ETOH use   AICD   SVT  - during prior hospital stay   Consultations:  Neurology  Discharge Exam: Filed Vitals:   01/07/14 1042  BP:   Pulse: 90  Temp:   Resp:     General: AAO x 3, no distress Cardiovascular: RRR, no murmurs Respiratory: CTA b/l   Discharge Instructions You were cared for by a hospitalist during your hospital stay. If you have any questions about your discharge medications or the care you received while you were in the hospital after you are discharged, you can call the unit and asked to speak with the hospitalist on call if the hospitalist that took care of you is not available. Once you are discharged, your primary care physician will  handle any further medical issues. Please note that NO REFILLS for any discharge medications will be authorized once you are discharged, as it is imperative that you return to your primary care physician (or establish a relationship with a primary care physician if you do not have one) for your aftercare needs so that they can reassess your need for medications and monitor your lab values.      Discharge Instructions   Diet - low sodium  heart healthy    Complete by:  As directed      Increase activity slowly    Complete by:  As directed             Medication List         amLODipine 10 MG tablet  Commonly known as:  NORVASC  Take 1 tablet (10 mg total) by mouth daily.     aspirin 325 MG tablet  Take 325 mg by mouth daily.     atorvastatin 10 MG tablet  Commonly known as:  LIPITOR  Take 20 mg by mouth daily. Take at 6pm     carvedilol 25 MG tablet  Commonly known as:  COREG  Take 1 tablet (25 mg total) by mouth 2 (two) times daily with a meal.     clopidogrel 75 MG tablet  Commonly known as:  PLAVIX  Take 1 tablet (75 mg total) by mouth daily with breakfast.     folic acid 1 MG tablet  Commonly known as:  FOLVITE  Take 1 tablet (1 mg total) by mouth daily.     menthol-cetylpyridinium 3 MG lozenge  Commonly known as:  CEPACOL  Take 1 lozenge (3 mg total) by mouth as needed for sore throat.     multivitamin with minerals Tabs tablet  Take 1 tablet by mouth daily.     pantoprazole 40 MG tablet  Commonly known as:  PROTONIX  Take 1 tablet (40 mg total) by mouth daily.     phenytoin 300 MG ER capsule  Commonly known as:  DILANTIN  Take 1 capsule (300 mg total) by mouth at bedtime.     ramipril 10 MG capsule  Commonly known as:  ALTACE  Take 20 mg by mouth daily.     thiamine 100 MG tablet  Take 1 tablet (100 mg total) by mouth daily.       Allergies  Allergen Reactions  . Codeine Hives  . Penicillins Hives  . Prednisone Hives   Follow-up Information   Follow up with Pola Corn, MD. Schedule an appointment as soon as possible for a visit in 1 week.   Specialty:  Cardiology   Contact information:   [address] [place] [address] [phone]        The results of significant diagnostics from this hospitalization (including imaging, microbiology, ancillary and laboratory) are listed below for reference.    Significant Diagnostic Studies: Ct Head Wo  Contrast  01/02/2014   CLINICAL DATA:  Found on floor; seizure-like activity. Concern for head or cervical spine injury.  EXAM: CT HEAD WITHOUT CONTRAST  CT CERVICAL SPINE WITHOUT CONTRAST  TECHNIQUE: Multidetector CT imaging of the head and cervical spine was performed following the standard protocol without intravenous contrast. Multiplanar CT image reconstructions of the cervical spine were also generated.  COMPARISON:  CT of the head performed 08/08/2011, and CTA of the head and neck performed 12/03/2010  FINDINGS: CT HEAD FINDINGS  There is no evidence of acute infarction, mass lesion, or  intra- or extra-axial hemorrhage on CT.  Prominence of the ventricles and sulci reflects mild cortical volume loss. Chronic encephalomalacia is noted at the left occipital lobe, reflecting remote infarct. Diffuse periventricular and subcortical white matter change likely reflects small vessel ischemic microangiopathy. Small chronic lacunar infarcts are seen within the basal ganglia bilaterally.  The brainstem and fourth ventricle are within normal limits. No mass effect or midline shift is seen.  There is no evidence of fracture; visualized osseous structures are unremarkable in appearance. The visualized portions of the orbits are within normal limits. The paranasal sinuses and mastoid air cells are well-aerated. No significant soft tissue abnormalities are seen.  CT CERVICAL SPINE FINDINGS  There is no evidence of fracture or subluxation. Vertebral bodies demonstrate normal height and alignment. Large anterior osteophytes are seen along the cervical spine. Intervertebral disc spaces are preserved. Prevertebral soft tissues are within normal limits. The visualized neural foramina are grossly unremarkable.  The thyroid gland is unremarkable in appearance. The visualized lung apices are clear. Calcification is noted at the carotid bifurcations bilaterally.  IMPRESSION: 1. No evidence of traumatic intracranial injury or  fracture. 2. No evidence of fracture or subluxation along the cervical spine. 3. Large anterior osteophytes seen along the cervical spine. 4. Mild cortical volume loss and diffuse small vessel ischemic microangiopathy. 5. Chronic encephalomalacia at the left occipital lobe, reflecting remote infarct. Small chronic lacunar infarcts within the basal ganglia bilaterally.   Electronically Signed   By: Roanna Raider M.D.   On: 01/02/2014 06:00   Ct Cervical Spine Wo Contrast  01/02/2014   CLINICAL DATA:  Found on floor; seizure-like activity. Concern for head or cervical spine injury.  EXAM: CT HEAD WITHOUT CONTRAST  CT CERVICAL SPINE WITHOUT CONTRAST  TECHNIQUE: Multidetector CT imaging of the head and cervical spine was performed following the standard protocol without intravenous contrast. Multiplanar CT image reconstructions of the cervical spine were also generated.  COMPARISON:  CT of the head performed 08/08/2011, and CTA of the head and neck performed 12/03/2010  FINDINGS: CT HEAD FINDINGS  There is no evidence of acute infarction, mass lesion, or intra- or extra-axial hemorrhage on CT.  Prominence of the ventricles and sulci reflects mild cortical volume loss. Chronic encephalomalacia is noted at the left occipital lobe, reflecting remote infarct. Diffuse periventricular and subcortical white matter change likely reflects small vessel ischemic microangiopathy. Small chronic lacunar infarcts are seen within the basal ganglia bilaterally.  The brainstem and fourth ventricle are within normal limits. No mass effect or midline shift is seen.  There is no evidence of fracture; visualized osseous structures are unremarkable in appearance. The visualized portions of the orbits are within normal limits. The paranasal sinuses and mastoid air cells are well-aerated. No significant soft tissue abnormalities are seen.  CT CERVICAL SPINE FINDINGS  There is no evidence of fracture or subluxation. Vertebral bodies  demonstrate normal height and alignment. Large anterior osteophytes are seen along the cervical spine. Intervertebral disc spaces are preserved. Prevertebral soft tissues are within normal limits. The visualized neural foramina are grossly unremarkable.  The thyroid gland is unremarkable in appearance. The visualized lung apices are clear. Calcification is noted at the carotid bifurcations bilaterally.  IMPRESSION: 1. No evidence of traumatic intracranial injury or fracture. 2. No evidence of fracture or subluxation along the cervical spine. 3. Large anterior osteophytes seen along the cervical spine. 4. Mild cortical volume loss and diffuse small vessel ischemic microangiopathy. 5. Chronic encephalomalacia at the left occipital lobe, reflecting  remote infarct. Small chronic lacunar infarcts within the basal ganglia bilaterally.   Electronically Signed   By: Roanna Raider M.D.   On: 01/02/2014 06:00   Dg Chest Portable 1 View  01/02/2014   CLINICAL DATA:  Status post fall; concern for chest injury.  EXAM: PORTABLE CHEST - 1 VIEW  COMPARISON:  Chest radiograph performed 10/27/2013  FINDINGS: The lungs are well-aerated. Minimal left basilar atelectasis is noted. There is no evidence of focal opacification, pleural effusion or pneumothorax.  The cardiomediastinal silhouette is borderline enlarged. An AICD is noted overlying the right chest wall, with a single lead ending overlying the right ventricle. No acute osseous abnormalities are seen.  IMPRESSION: Minimal left basilar atelectasis noted; lungs otherwise clear. No displaced rib fracture seen.   Electronically Signed   By: Roanna Raider M.D.   On: 01/02/2014 06:58   Dg Shoulder Left  01/05/2014   CLINICAL DATA:  Proximal humerus pain secondary to a fall 3 days ago. History of 2 prior dislocations.  EXAM: LEFT SHOULDER - 2+ VIEW  COMPARISON:  None.  FINDINGS: The visualized portion of the humerus is intact. There appears to be an avulsion of the posterior rim  of the glenoid. This may not be acute. There are slight degenerative changes of the acromioclavicular joint with slight osteophyte formation on the acromion. No dislocation.  IMPRESSION: Avulsion of the posterior aspect of the glenoid which may not be acute. No dislocation.   Electronically Signed   By: Geanie Cooley M.D.   On: 01/05/2014 20:25   Dg Hip Portable 1 View Left  01/07/2014   CLINICAL DATA:  Status post fall; left hip pain.  EXAM: PORTABLE LEFT HIP - 1 VIEW  COMPARISON:  None.  FINDINGS: There is no evidence of fracture or dislocation. The proximal left femur appears intact. No significant degenerative change is appreciated. The visualized portions of the left sacroiliac joint are unremarkable in appearance.  The visualized bowel gas pattern is grossly unremarkable in appearance.  IMPRESSION: No evidence of fracture or dislocation.   Electronically Signed   By: Roanna Raider M.D.   On: 01/07/2014 02:34    Microbiology: Recent Results (from the past 240 hour(s))  URINE CULTURE     Status: None   Collection Time    01/02/14  6:24 AM      Result Value Ref Range Status   Specimen Description URINE, RANDOM   Final   Special Requests NONE   Final   Culture  Setup Time     Final   Value: 01/04/2014 04:28     Performed at [facility] Count     Final   Value: NO GROWTH     Performed at [facility]   Culture     Final   Value: NO GROWTH     Performed at [facility]   Report Status 01/05/2014 FINAL   Final     Labs: Basic Metabolic Panel:  Recent Labs Lab 01/02/14 0412 01/03/14 0332 01/04/14 0449  NA 146 144 139  K 3.8 3.5* 3.7  CL 105 101 96  CO2 23 23 25   GLUCOSE 106* 76 117*  BUN 10 11 12   CREATININE 1.45* 1.06 0.93  CALCIUM 8.7 9.1 10.0   Liver Function Tests:  Recent Labs Lab 01/02/14 0412 01/03/14 0332  AST 108* 88*  ALT 83* 76*  ALKPHOS 109 113  BILITOT 0.4 0.8  PROT 6.6 6.9  ALBUMIN 3.5 3.6  No results found for  this basename: LIPASE, AMYLASE,  in the last 168 hours No results found for this basename: AMMONIA,  in the last 168 hours CBC:  Recent Labs Lab 01/02/14 0412 01/03/14 0332 01/05/14 1220  WBC 5.7 3.4* 5.0  NEUTROABS 3.4  --   --   HGB 10.8* 10.9* 13.8  HCT 32.3* 32.5* 40.6  MCV 93.4 92.3 92.7  PLT 126* 115* 96*   Cardiac Enzymes:  Recent Labs Lab 01/02/14 0412  TROPONINI <0.30   BNP: BNP (last 3 results)  Recent Labs  10/27/13 2038  PROBNP 692.6*   CBG:  Recent Labs Lab 01/06/14 0730 01/06/14 1151 01/06/14 1648 01/06/14 2002 01/07/14 0755  GLUCAP 101* 128* 138* 150* 103*       SignedCalvert Cantor, MD [facility] 01/07/2014, 11:35 AM

## 2014-01-07 NOTE — Progress Notes (Signed)
Utilization review completed.  

## 2014-01-07 NOTE — Progress Notes (Signed)
Pt complained of severe pain, 9/10, in his left hip that has been slowly getting worse and is made worse with movement.  Pt alert and oriented to everything but time.  MD on call, K. Schorr, notified and a one time dose of Toradol and Oxy IR was ordered and given.  Pt now denying pain.  Will continue to monitor.

## 2014-01-07 NOTE — Progress Notes (Signed)
CSW (Clinical Child psychotherapist) notified facility of dc today. They confirmed they can accept pt today.  Lakela Kuba, LCSWA [phone]

## 2014-01-11 ENCOUNTER — Non-Acute Institutional Stay (SKILLED_NURSING_FACILITY): Payer: Medicare Other | Admitting: Internal Medicine

## 2014-01-11 ENCOUNTER — Encounter: Payer: Self-pay | Admitting: Internal Medicine

## 2014-01-11 DIAGNOSIS — I6350 Cerebral infarction due to unspecified occlusion or stenosis of unspecified cerebral artery: Secondary | ICD-10-CM

## 2014-01-11 DIAGNOSIS — F10231 Alcohol dependence with withdrawal delirium: Secondary | ICD-10-CM

## 2014-01-11 DIAGNOSIS — I639 Cerebral infarction, unspecified: Secondary | ICD-10-CM

## 2014-01-11 DIAGNOSIS — I5022 Chronic systolic (congestive) heart failure: Secondary | ICD-10-CM

## 2014-01-11 DIAGNOSIS — F10931 Alcohol use, unspecified with withdrawal delirium: Secondary | ICD-10-CM

## 2014-01-11 DIAGNOSIS — I428 Other cardiomyopathies: Secondary | ICD-10-CM

## 2014-01-11 DIAGNOSIS — Z95810 Presence of automatic (implantable) cardiac defibrillator: Secondary | ICD-10-CM

## 2014-01-11 DIAGNOSIS — F1010 Alcohol abuse, uncomplicated: Secondary | ICD-10-CM

## 2014-01-11 DIAGNOSIS — R569 Unspecified convulsions: Secondary | ICD-10-CM

## 2014-01-11 NOTE — Progress Notes (Signed)
Patient ID: [name] Phillips, male   DOB: 1956/05/25, 58 y.o.   MRN: [id number]  Location:  [facility] Provider:  Gwenith Spitz. Renato Gails, D.O., C.M.D.  Code Status:  Full  Chief Complaint  Patient presents with  . Establish Care    new nursing home admission s/p hospitalization with syncopal episode, hypotension and hypothermia    HPI:  58 yo male with h/o alcohol abuse, tobacco abuse, systolic chf with nonischemic cardiomyopathy (EF 15-20%) and AICD present, thrombocytpenia, prior stroke was admitted here for short term rehab s/p hospitalization with syncope, hypothermia and hypotension.  His wife provided history that he had a seizure at home prior to these signs appearing.  In the ED, his alcohol level was over 300.  His CT abdomen 2013 showed fatty liver.  He had hypokalemia that was corrected.  He went through delirium tremens during his stay and was treated with prn IV ativan.  He is on dilantin and will require monitoring of his levels.  He also needs to f/u with his cardiologist re: his aicd--Dr. Meredith Mody at [facility] cardiology.    Review of Systems:  Review of Systems  Constitutional: Negative for fever and chills.  HENT: Negative for congestion.   Eyes: Negative for blurred vision.  Respiratory: Negative for shortness of breath.   Cardiovascular: Negative for chest pain.  Gastrointestinal: Negative for abdominal pain, constipation, blood in stool and melena.  Genitourinary: Negative for dysuria.  Musculoskeletal: Positive for falls.  Skin: Negative for rash.  Neurological: Positive for seizures and loss of consciousness.  Endo/Heme/Allergies: Bruises/bleeds easily.  Psychiatric/Behavioral: Positive for substance abuse. Negative for memory loss.       Alcohol    Medications: Patient's Medications  New Prescriptions   No medications on file  Previous Medications   AMLODIPINE (NORVASC) 10 MG TABLET    Take 1 tablet (10 mg total) by mouth daily.   ASPIRIN 325 MG TABLET     Take 325 mg by mouth daily.   ATORVASTATIN (LIPITOR) 10 MG TABLET    Take 20 mg by mouth daily. Take at 6pm   CARVEDILOL (COREG) 25 MG TABLET    Take 1 tablet (25 mg total) by mouth 2 (two) times daily with a meal.   CLOPIDOGREL (PLAVIX) 75 MG TABLET    Take 1 tablet (75 mg total) by mouth daily with breakfast.   FOLIC ACID (FOLVITE) 1 MG TABLET    Take 1 tablet (1 mg total) by mouth daily.   MENTHOL-CETYLPYRIDINIUM (CEPACOL) 3 MG LOZENGE    Take 1 lozenge by mouth as needed for sore throat.   MULTIPLE VITAMIN (MULTIVITAMIN WITH MINERALS) TABS TABLET    Take 1 tablet by mouth daily.   PANTOPRAZOLE (PROTONIX) 40 MG TABLET    Take 1 tablet (40 mg total) by mouth daily.   PHENYTOIN (DILANTIN) 300 MG ER CAPSULE    Take 1 capsule (300 mg total) by mouth at bedtime.   RAMIPRIL (ALTACE) 10 MG CAPSULE    Take 20 mg by mouth daily.    THIAMINE 100 MG TABLET    Take 1 tablet (100 mg total) by mouth daily.  Modified Medications   No medications on file  Discontinued Medications   MENTHOL-CETYLPYRIDINIUM (CEPACOL) 3 MG LOZENGE    Take 1 lozenge (3 mg total) by mouth as needed for sore throat.    Physical Exam: Filed Vitals:   01/11/14 1141  BP: 121/80  Pulse: 78  Temp: 97.1 F (36.2 C)  Resp: 19  Height: 6\' 1"  (1.854 m)  Weight: 163 lb (73.936 kg)  Physical Exam  Constitutional: He is oriented to person, place, and time.  Chronically ill appearing male, nad sitting up in wheelchair in his room, wife is visiting  HENT:  Head: Normocephalic and atraumatic.  Right Ear: External ear normal.  Left Ear: External ear normal.  Nose: Nose normal.  Mouth/Throat: Oropharynx is clear and moist.  Eyes: Conjunctivae and EOM are normal. Pupils are equal, round, and reactive to light.  Wears glasses  Neck: Normal range of motion. Neck supple. No JVD present.  Cardiovascular: Normal rate, regular rhythm, normal heart sounds and intact distal pulses.   Pulmonary/Chest: Effort normal and breath sounds  normal. No respiratory distress.  Abdominal: Soft. Bowel sounds are normal. He exhibits no distension and no mass. There is no tenderness.  Musculoskeletal: Normal range of motion. He exhibits tenderness. He exhibits no edema.  Left shoulder  Neurological: He is alert and oriented to person, place, and time. He has normal reflexes.  Skin: Skin is warm and dry.  Several small ecchymoses present  Psychiatric: He has a normal mood and affect.     Labs reviewed: Basic Metabolic Panel:  Recent Labs  29/56/21 1155  01/02/14 0412 01/03/14 0332 01/04/14 0449  NA  --   < > 146 144 139  K  --   < > 3.8 3.5* 3.7  CL  --   < > 105 101 96  CO2  --   < > 23 23 25   GLUCOSE  --   < > 106* 76 117*  BUN  --   < > 10 11 12   CREATININE  --   < > 1.45* 1.06 0.93  CALCIUM  --   < > 8.7 9.1 10.0  MG 1.3*  --   --   --   --   < > = values in this interval not displayed.  Liver Function Tests:  Recent Labs  10/28/13 0535 01/02/14 0412 01/03/14 0332  AST 47* 108* 88*  ALT 44 83* 76*  ALKPHOS 103 109 113  BILITOT 0.3 0.4 0.8  PROT 6.3 6.6 6.9  ALBUMIN 3.1* 3.5 3.6    CBC:  Recent Labs  10/27/13 2038  01/02/14 0412 01/03/14 0332 01/05/14 1220  WBC 5.0  < > 5.7 3.4* 5.0  NEUTROABS 2.4  --  3.4  --   --   HGB 11.2*  < > 10.8* 10.9* 13.8  HCT 32.6*  < > 32.3* 32.5* 40.6  MCV 93.7  < > 93.4 92.3 92.7  PLT 175  < > 126* 115* 96*  < > = values in this interval not displayed.  Significant Diagnostic Results:  CT head and cspine 01/02/14:  . No evidence of traumatic intracranial injury or fracture. 2. No evidence of fracture or subluxation along the cervical spine. 3. Large anterior osteophytes seen along the cervical spine. 4. Mild cortical volume loss and diffuse small vessel ischemic microangiopathy. 5. Chronic encephalomalacia at the left occipital lobe, reflecting remote infarct. Small chronic lacunar infarcts within the basal ganglia bilaterally.  CXR 01/02/14:  Minimal left basilar  atelectasis noted; lungs otherwise clear. No displaced rib fracture seen.  Left shoulder xrays 01/02/14:  Avulsion of the posterior aspect of the glenoid which may not be acute. No dislocation.  Left hip xrays 01/02/14:  No evidence of fracture or dislocation.   Assessment/Plan 1. Seizure Has disorder per records Cont dilantin F/u levels and bmp in 1 wk  Not helped by his alcohol abuse and withdrawals  2. Alcohol withdrawal delirium Has resolved, no longer needing benzos  3. Cardiomyopathy, nonischemic Cause of #4, likely alcohol   4. 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 Alcohol abuse Says he plans to quit, should be offered referral to behavioral health for counseling at d/c  6. AICD (automatic cardioverter/defibrillator) present F/u with Dr. Meredith Mody   7. Stroke in past With some residual weakness Will receive therapy here   Family/ staff Communication: seen with unit supervisor, pt's wife was also present  Goals of care: here for short term rehab with goal to return home, says he plans to stay off alcohol  Labs/tests ordered:  Bmp, dilantin level in 1 wk, f/u with Dr. Meredith Mody at [facility] cardiology  Lenna Hagarty L. Eimi Viney, D.O. [facility]iatric[facility], [place] [address] [place], [place] Facility phone [phone]):  [phone] Facility fax:  [phone] Cell Phone (Mon-Fri 8am-5pm):  [phone] On Call Provider (follow prompts after 5pm & weekends):  [phone]

## 2014-01-15 ENCOUNTER — Encounter: Payer: Self-pay | Admitting: Internal Medicine

## 2014-01-18 ENCOUNTER — Non-Acute Institutional Stay (SKILLED_NURSING_FACILITY): Payer: Medicare Other | Admitting: Internal Medicine

## 2014-01-18 ENCOUNTER — Encounter: Payer: Self-pay | Admitting: Internal Medicine

## 2014-01-18 DIAGNOSIS — F1010 Alcohol abuse, uncomplicated: Secondary | ICD-10-CM

## 2014-01-18 DIAGNOSIS — R569 Unspecified convulsions: Secondary | ICD-10-CM

## 2014-01-18 DIAGNOSIS — Z95810 Presence of automatic (implantable) cardiac defibrillator: Secondary | ICD-10-CM

## 2014-01-18 DIAGNOSIS — F10931 Alcohol use, unspecified with withdrawal delirium: Secondary | ICD-10-CM

## 2014-01-18 DIAGNOSIS — I639 Cerebral infarction, unspecified: Secondary | ICD-10-CM

## 2014-01-18 DIAGNOSIS — I6350 Cerebral infarction due to unspecified occlusion or stenosis of unspecified cerebral artery: Secondary | ICD-10-CM

## 2014-01-18 DIAGNOSIS — I428 Other cardiomyopathies: Secondary | ICD-10-CM

## 2014-01-18 DIAGNOSIS — I5022 Chronic systolic (congestive) heart failure: Secondary | ICD-10-CM

## 2014-01-18 DIAGNOSIS — F10231 Alcohol dependence with withdrawal delirium: Secondary | ICD-10-CM

## 2014-01-18 NOTE — Progress Notes (Signed)
Patient ID: [name] Phillips, male   DOB: 10-16-1955, 58 y.o.   MRN: [id number]  Location:  [facility] Provider:  Gwenith Spitz. Renato Gails, D.O., C.M.D.  Code Status:  Full  Chief Complaint  Patient presents with  . Discharge Note    HPI:  58 yo male was here for rehab after hospitalization.  He has completed his PT, OT and is eager to discharge home.    From my H+P: He has a h/o alcohol abuse, tobacco abuse, systolic chf with nonischemic cardiomyopathy (EF 15-20%) and AICD present, thrombocytopenia, prior stroke.  He had hospitalization with syncope, hypothermia and hypotension. His wife provided history that he had a seizure at home prior to these signs appearing. In the ED, his alcohol level was over 300. His CT abdomen 2013 showed fatty liver. He had hypokalemia that was corrected. He went through delirium tremens during his stay and was treated with prn IV ativan. He is on dilantin and will require monitoring of his levels. He also needs to f/u with his cardiologist re: his aicd--Dr. Meredith Mody at [facility] cardiology.    Review of Systems:  Review of Systems  Constitutional: Positive for weight loss and malaise/fatigue. Negative for fever.  HENT: Negative for congestion.   Eyes: Negative for blurred vision.  Respiratory: Negative for shortness of breath.   Cardiovascular: Negative for chest pain.  Gastrointestinal: Negative for abdominal pain.  Genitourinary: Negative for dysuria.  Musculoskeletal: Negative for myalgias.  Skin: Negative for rash.  Neurological: Positive for weakness. Negative for dizziness and loss of consciousness.  Endo/Heme/Allergies: Bruises/bleeds easily.  Psychiatric/Behavioral: Positive for depression.    Medications: Patient's Medications  New Prescriptions   No medications on file  Previous Medications   AMLODIPINE (NORVASC) 10 MG TABLET    Take 1 tablet (10 mg total) by mouth daily.   ASPIRIN 325 MG TABLET    Take 325 mg by mouth daily.     ATORVASTATIN (LIPITOR) 10 MG TABLET    Take 20 mg by mouth daily. Take at 6pm   CARVEDILOL (COREG) 25 MG TABLET    Take 1 tablet (25 mg total) by mouth 2 (two) times daily with a meal.   CLOPIDOGREL (PLAVIX) 75 MG TABLET    Take 1 tablet (75 mg total) by mouth daily with breakfast.   FOLIC ACID (FOLVITE) 1 MG TABLET    Take 1 tablet (1 mg total) by mouth daily.   MULTIPLE VITAMIN (MULTIVITAMIN WITH MINERALS) TABS TABLET    Take 1 tablet by mouth daily.   PANTOPRAZOLE (PROTONIX) 40 MG TABLET    Take 1 tablet (40 mg total) by mouth daily.   PHENYTOIN (DILANTIN) 300 MG ER CAPSULE    Take 1 capsule (300 mg total) by mouth at bedtime.   RAMIPRIL (ALTACE) 10 MG CAPSULE    Take 20 mg by mouth daily.    THIAMINE 100 MG TABLET    Take 1 tablet (100 mg total) by mouth daily.  Modified Medications   No medications on file  Discontinued Medications   MENTHOL-CETYLPYRIDINIUM (CEPACOL) 3 MG LOZENGE    Take 1 lozenge by mouth as needed for sore throat.    Physical Exam: Filed Vitals:   01/18/14 1509  BP: 134/88  Pulse: 86  Temp: 97.2 F (36.2 C)  Resp: 20  Height: 6\' 1"  (1.854 m)  Weight: 173 lb (78.472 kg)  Physical Exam  Constitutional: No distress.  Cardiovascular: Normal rate, regular rhythm, normal heart sounds and intact distal pulses.  Pulmonary/Chest: Effort normal and breath sounds normal. No respiratory distress.  Abdominal: Soft. Bowel sounds are normal. He exhibits no distension and no mass. There is no tenderness.  Musculoskeletal: Normal range of motion.  Neurological: He is alert.  Psychiatric:  Flat affect     Labs reviewed: Basic Metabolic Panel:  Recent Labs  84/69/62 1155  01/02/14 0412 01/03/14 0332 01/04/14 0449  NA  --   < > 146 144 139  K  --   < > 3.8 3.5* 3.7  CL  --   < > 105 101 96  CO2  --   < > 23 23 25   GLUCOSE  --   < > 106* 76 117*  BUN  --   < > 10 11 12   CREATININE  --   < > 1.45* 1.06 0.93  CALCIUM  --   < > 8.7 9.1 10.0  MG 1.3*  --   --    --   --   < > = values in this interval not displayed.  Liver Function Tests:  Recent Labs  10/28/13 0535 01/02/14 0412 01/03/14 0332  AST 47* 108* 88*  ALT 44 83* 76*  ALKPHOS 103 109 113  BILITOT 0.3 0.4 0.8  PROT 6.3 6.6 6.9  ALBUMIN 3.1* 3.5 3.6    CBC:  Recent Labs  10/27/13 2038  01/02/14 0412 01/03/14 0332 01/05/14 1220  WBC 5.0  < > 5.7 3.4* 5.0  NEUTROABS 2.4  --  3.4  --   --   HGB 11.2*  < > 10.8* 10.9* 13.8  HCT 32.6*  < > 32.3* 32.5* 40.6  MCV 93.7  < > 93.4 92.3 92.7  PLT 175  < > 126* 115* 96*  < > = values in this interval not displayed.   Assessment/Plan 1. Seizure -cont keppra and dilantin -will need f/u dilantin levels as outpatient  2. Alcohol withdrawal delirium -resolved, cont thiamine, protonix, folic acid  3. Cardiomyopathy, nonischemic -likely alcohol induced -f/u cardiology  4. Chronic systolic heart failure -cont ace, asa, beta blocker, plavix -f/u cardiology  5. Alcohol abuse -will be referred to AA and to behavioral health  6. AICD (automatic cardioverter/defibrillator) present -f/u with cardiology  7. Stroke -in past, cont secondary prevention  D/c home--has no equipment or home health needs.  Needs new PCP;  Social work to arrange for [employer] and behavioral health referrals at d/c home; 30 day supply meds and f/u pcp appt provided

## 2014-01-27 ENCOUNTER — Encounter: Payer: Self-pay | Admitting: Internal Medicine

## 2014-05-10 ENCOUNTER — Encounter: Payer: Self-pay | Admitting: *Deleted

## 2014-11-03 ENCOUNTER — Encounter (HOSPITAL_COMMUNITY): Payer: Self-pay | Admitting: Emergency Medicine

## 2014-11-03 ENCOUNTER — Emergency Department (HOSPITAL_COMMUNITY)
Admission: EM | Admit: 2014-11-03 | Discharge: 2014-11-03 | Disposition: A | Discharge status: Home / Self Care | Payer: Medicare Other | Attending: Emergency Medicine | Admitting: Emergency Medicine

## 2014-11-03 DIAGNOSIS — Z79899 Other long term (current) drug therapy: Secondary | ICD-10-CM | POA: Diagnosis not present

## 2014-11-03 DIAGNOSIS — G40909 Epilepsy, unspecified, not intractable, without status epilepticus: Secondary | ICD-10-CM | POA: Diagnosis not present

## 2014-11-03 DIAGNOSIS — R1013 Epigastric pain: Secondary | ICD-10-CM | POA: Insufficient documentation

## 2014-11-03 DIAGNOSIS — Z880 Allergy status to penicillin: Secondary | ICD-10-CM | POA: Insufficient documentation

## 2014-11-03 DIAGNOSIS — Z8673 Personal history of transient ischemic attack (TIA), and cerebral infarction without residual deficits: Secondary | ICD-10-CM | POA: Diagnosis not present

## 2014-11-03 DIAGNOSIS — I509 Heart failure, unspecified: Secondary | ICD-10-CM | POA: Insufficient documentation

## 2014-11-03 DIAGNOSIS — Z720 Tobacco use: Secondary | ICD-10-CM | POA: Diagnosis not present

## 2014-11-03 DIAGNOSIS — I10 Essential (primary) hypertension: Secondary | ICD-10-CM | POA: Diagnosis not present

## 2014-11-03 DIAGNOSIS — Z7902 Long term (current) use of antithrombotics/antiplatelets: Secondary | ICD-10-CM | POA: Diagnosis not present

## 2014-11-03 DIAGNOSIS — Z7982 Long term (current) use of aspirin: Secondary | ICD-10-CM | POA: Diagnosis not present

## 2014-11-03 LAB — COMPREHENSIVE METABOLIC PANEL
ALT: 14 U/L — ABNORMAL LOW (ref 17–63)
AST: 17 U/L (ref 15–41)
Albumin: 3.6 g/dL (ref 3.5–5.0)
Alkaline Phosphatase: 168 U/L — ABNORMAL HIGH (ref 38–126)
Anion gap: 9 (ref 5–15)
BUN: 21 mg/dL — ABNORMAL HIGH (ref 6–20)
CO2: 21 mmol/L — ABNORMAL LOW (ref 22–32)
Calcium: 9 mg/dL (ref 8.9–10.3)
Chloride: 107 mmol/L (ref 101–111)
Creatinine, Ser: 1.17 mg/dL (ref 0.61–1.24)
GFR calc Af Amer: >60 mL/min (ref >60)
GFR calc non Af Amer: >60 mL/min (ref >60)
Glucose, Bld: 101 mg/dL — ABNORMAL HIGH (ref 65–99)
Potassium: 4.1 mmol/L (ref 3.5–5.1)
Sodium: 137 mmol/L (ref 135–145)
Total Bilirubin: 0.5 mg/dL (ref 0.3–1.2)
Total Protein: 7.3 g/dL (ref 6.5–8.1)

## 2014-11-03 LAB — CBC WITH DIFFERENTIAL/PLATELET
Basophils Absolute: 0 10*3/uL (ref 0.0–0.1)
Basophils Relative: 0 % (ref 0–1)
Eosinophils Absolute: 0.3 10*3/uL (ref 0.0–0.7)
Eosinophils Relative: 5 % (ref 0–5)
HCT: 32.5 % — ABNORMAL LOW (ref 39.0–52.0)
Hemoglobin: 10.7 g/dL — ABNORMAL LOW (ref 13.0–17.0)
Lymphocytes Relative: 47 % — ABNORMAL HIGH (ref 12–46)
Lymphs Abs: 2.8 10*3/uL (ref 0.7–4.0)
MCH: 29.1 pg (ref 26.0–34.0)
MCHC: 32.9 g/dL (ref 30.0–36.0)
MCV: 88.3 fL (ref 78.0–100.0)
Monocytes Absolute: 0.4 10*3/uL (ref 0.1–1.0)
Monocytes Relative: 7 % (ref 3–12)
Neutro Abs: 2.5 10*3/uL (ref 1.7–7.7)
Neutrophils Relative %: 41 % — ABNORMAL LOW (ref 43–77)
Platelets: 223 10*3/uL (ref 150–400)
RBC: 3.68 MIL/uL — ABNORMAL LOW (ref 4.22–5.81)
RDW: 14.9 % (ref 11.5–15.5)
WBC: 6 10*3/uL (ref 4.0–10.5)

## 2014-11-03 LAB — I-STAT TROPONIN, ED: Troponin i, poc: 0.01 ng/mL (ref 0.00–0.08)

## 2014-11-03 LAB — LIPASE, BLOOD: Lipase: 20 U/L — ABNORMAL LOW (ref 22–51)

## 2014-11-03 MED ORDER — FAMOTIDINE 20 MG PO TABS: 20.0000 mg | ORAL_TABLET | Freq: Two times a day (BID) | ORAL | Status: DC

## 2014-11-03 NOTE — ED Notes (Signed)
Bed: HY85 Expected date:  Expected time:  Means of arrival:  Comments: EMS diabetes complaint

## 2014-11-03 NOTE — ED Provider Notes (Signed)
CSN: [id number]     Arrival date & time 11/03/14  0042 History   First MD Initiated Contact with Patient 11/03/14 0131     Chief Complaint  Patient presents with  . Abdominal Pain     (Consider location/radiation/quality/duration/timing/severity/associated sxs/prior Treatment) Patient is a 59 y.o. male presenting with abdominal pain. The history is provided by the patient and the spouse. No language interpreter was used.  Abdominal Pain Pain location:  Epigastric Pain quality: sharp   Pain radiates to:  Does not radiate Associated symptoms: no chest pain, no chills, no fever, no nausea, no shortness of breath and no vomiting   Associated symptoms comment:  He presents for evaluation of recurrent epigastric pain that has been intermittent over the last 2 months. No chest pain or SOB. No vomiting. He denies any bowel movement changes. No fever. He reports the pain starts after taking his medications and resolves over time. He states he has been seen by his doctor who wants him to see a "stomach doctor" but arrangements have not been made.    Past Medical History  Diagnosis Date  . CVA (cerebral vascular accident)   . Hypertension   . CHF (congestive heart failure)   . Seizure   . Nonischemic cardiomyopathy     Catheterization 2005 normal coronary arteries; EF 15-20%;  2011 EF normal  . Alcohol abuse   . CHF (congestive heart failure)    Past Surgical History  Procedure Laterality Date  . Cardiac defibrillator placement  2007    MDT [employer] single chamber ICD implanted with 6949 lead by Dr Ty Hilts for primary prevention   Family History  Problem Relation Age of Onset  . Heart disease Father   . Hypertension     History  Substance Use Topics  . Smoking status: Current Every Day Smoker -- 1.00 packs/day for 39 years    Types: Cigarettes  . Smokeless tobacco: Former Neurosurgeon    Types: Snuff, Chew  . Alcohol Use: 1.2 oz/week    2 Cans of beer per week     Comment: no longer drinks  moonshine but drinks beer    Review of Systems  Constitutional: Negative for fever and chills.  Respiratory: Negative.  Negative for shortness of breath.   Cardiovascular: Negative.  Negative for chest pain.  Gastrointestinal: Positive for abdominal pain. Negative for nausea and vomiting.  Musculoskeletal: Negative.   Neurological: Negative.       Allergies  Codeine; Penicillins; and Prednisone  Home Medications   Prior to Admission medications   Medication Sig Start Date End Date Taking? Authorizing Provider  aspirin 325 MG tablet Take 325 mg by mouth daily.   Yes Historical Provider, MD  atorvastatin (LIPITOR) 10 MG tablet Take 20 mg by mouth daily. Take at 6pm 08/01/13  Yes Gwyneth Sprout, MD  carvedilol (COREG) 25 MG tablet Take 1 tablet (25 mg total) by mouth 2 (two) times daily with a meal. 08/01/13  Yes Gwyneth Sprout, MD  clopidogrel (PLAVIX) 75 MG tablet Take 1 tablet (75 mg total) by mouth daily with breakfast. 07/31/13  Yes Rolan Bucco, MD  Multiple Vitamin (MULTIVITAMIN WITH MINERALS) TABS tablet Take 1 tablet by mouth daily. 10/30/13  Yes Jessica U Vann, DO  OVER THE COUNTER MEDICATION Take 2 tablets by mouth daily. Testo Rip X supplement   Yes Historical Provider, MD  OVER THE COUNTER MEDICATION Take 2 tablets by mouth daily. Nitro Nos X supplement   Yes Historical Provider, MD  ramipril (ALTACE)  10 MG capsule Take 20 mg by mouth 2 (two) times daily.  08/01/13  Yes Gwyneth Sprout, MD  spironolactone (ALDACTONE) 25 MG tablet Take 25 mg by mouth daily.   Yes Historical Provider, MD  pantoprazole (PROTONIX) 40 MG tablet Take 1 tablet (40 mg total) by mouth daily. Patient not taking: Reported on 11/03/2014 10/30/13   Joseph Art, DO  phenytoin (DILANTIN) 300 MG ER capsule Take 1 capsule (300 mg total) by mouth at bedtime. Patient not taking: Reported on 11/03/2014 01/04/14   Kathlen Mody, MD   BP 138/92 mmHg  Pulse 74  Temp(Src) 97.6 F (36.4 C) (Oral)  Resp 11  Ht 6\' 3"   (1.905 m)  Wt 180 lb (81.647 kg)  BMI 22.50 kg/m2  SpO2 96% Physical Exam  Constitutional: He is oriented to person, place, and time. He appears well-developed and well-nourished.  HENT:  Head: Normocephalic.  Neck: Normal range of motion. Neck supple.  Cardiovascular: Normal rate and regular rhythm.   Pulmonary/Chest: Effort normal and breath sounds normal.  Abdominal: Soft. Bowel sounds are normal. He exhibits no distension. There is no tenderness. There is no rebound and no guarding.  Currently non-tender to palpation of abdomen.  Musculoskeletal: Normal range of motion.  Neurological: He is alert and oriented to person, place, and time.  Skin: Skin is warm and dry. No rash noted.  Psychiatric: He has a normal mood and affect.    ED Course  Procedures (including critical care time) Labs Review Labs Reviewed  LIPASE, BLOOD - Abnormal; Notable for the following:    Lipase 20 (*)    All other components within normal limits  CBC WITH DIFFERENTIAL/PLATELET - Abnormal; Notable for the following:    RBC 3.68 (*)    Hemoglobin 10.7 (*)    HCT 32.5 (*)    Neutrophils Relative % 41 (*)    Lymphocytes Relative 47 (*)    All other components within normal limits  COMPREHENSIVE METABOLIC PANEL - Abnormal; Notable for the following:    CO2 21 (*)    Glucose, Bld 101 (*)    BUN 21 (*)    ALT 14 (*)    Alkaline Phosphatase 168 (*)    All other components within normal limits  URINALYSIS, ROUTINE W REFLEX MICROSCOPIC (NOT AT [facility])  I-STAT TROPOININ, ED    Imaging Review No results found.   EKG Interpretation   Date/Time:  Thursday November 03 2014 01:04:30 EDT Ventricular Rate:  67 PR Interval:  200 QRS Duration: 86 QT Interval:  398 QTC Calculation: 420 R Axis:   41 Text Interpretation:  Sinus rhythm Anterior infarct, old Flipped T waves  from August 2015 improved Confirmed by Gwendolyn Grant  MD, BLAIR [phone]) on  11/03/2014 1:07:31 AM      MDM   Final diagnoses:  None    1.  Dyspepsia  Lab studies are essentially unremarkable. The patient is pain free now after pain that is recurrent for months. VSS. Will refer to GI for further management. Will start on PPI to attempt to control symptoms.    Elpidio Anis, PA-C 11/03/14 7482  Elwin Mocha, MD 11/03/14 [phone]

## 2014-11-03 NOTE — Discharge Instructions (Signed)
Indigestion Indigestion is discomfort in the upper abdomen that is caused by underlying problems such as gastroesophageal reflux disease (GERD), ulcers, or gallbladder problems.  CAUSES  Indigestion can be caused by many things. Possible causes include:  Stomach acid in the esophagus.  Stomach infections, usually caused by the bacteria H. pylori.  Being overweight.  Hiatal hernia. This means part of the stomach pushes up through the diaphragm.  Overeating.  Emotional problems, such as stress, anxiety, or depression.  Poor nutrition.  Consuming too much alcohol, tobacco, or caffeine.  Consuming spicy foods, fats, peppermint, chocolate, tomato products, citrus, or fruit juices.  Medicines such as aspirin and other anti-inflammatory drugs, hormones, steroids, and thyroid medicines.  Gastroparesis. This is a condition in which the stomach does not empty properly.  Stomach cancer.  Pregnancy, due to an increase in hormone levels, a relaxation of muscles in the digestive tract, and pressure on the stomach from the growing fetus. SYMPTOMS   Uncomfortable feeling of fullness after eating.  Pain or burning sensation in the upper abdomen.  Bloating.  Belching and gas.  Nausea and vomiting.  Acidic taste in the mouth.  Burning sensation in the chest (heartburn). DIAGNOSIS  Your caregiver will review your medical history and perform a physical exam. Other tests, such as blood tests, stool tests, X-rays, and other imaging scans, may be done to check for more serious problems. TREATMENT  Liquid antacids and other drugs may be given to block stomach acid secretion. Medicines that increase esophageal muscle tone may also be given to help reduce symptoms. If an infection is found, antibiotic medicine may be given. HOME CARE INSTRUCTIONS  Avoid foods and drinks that make your symptoms worse, such as:  Caffeine or alcoholic drinks.  Chocolate.  Peppermint or mint  flavorings.  Garlic and onions.  Spicy foods.  Citrus fruits, such as oranges, lemons, or limes.  Tomato-based foods such as sauce, chili, salsa, and pizza.  Fried and fatty foods.  Avoid eating for the 3 hours prior to your bedtime.  Eat small, frequent meals instead of large meals.  Stop smoking if you smoke.  Maintain a healthy weight.  Wear loose-fitting clothing. Do not wear anything tight around your waist that causes pressure on your stomach.  Raise the head of your bed 4 to 8 inches with wood blocks to help you sleep. Extra pillows will not help.  Only take over-the-counter or prescription medicines as directed by your caregiver.  Do not take aspirin, ibuprofen, or other nonsteroidal anti-inflammatory drugs (NSAIDs). SEEK IMMEDIATE MEDICAL CARE IF:   You are not better after 2 days.  You have chest pressure or pain that radiates up into your neck, arms, back, jaw, or upper abdomen.  You have difficulty swallowing.  You keep vomiting.  You have black or bloody stools.  You have a fever.  You have dizziness, fainting, difficulty breathing, or heavy sweating.  You have severe abdominal pain.  You lose weight without trying. MAKE SURE YOU:  Understand these instructions.  Will watch your condition.  Will get help right away if you are not doing well or get worse. Document Released: 06/20/2004 Document Revised: 08/05/2011 Document Reviewed: 12/26/2010 [facility] Patient Information 2015 [place], [place]. This information is not intended to replace advice given to you by your health care provider. Make sure you discuss any questions you have with your health care provider.  Abdominal Pain Many things can cause abdominal pain. Usually, abdominal pain is not caused by a disease and will  improve without treatment. It can often be observed and treated at home. Your health care provider will do a physical exam and possibly order blood tests and X-rays to help  determine the seriousness of your pain. However, in many cases, more time must pass before a clear cause of the pain can be found. Before that point, your health care provider may not know if you need more testing or further treatment. HOME CARE INSTRUCTIONS  Monitor your abdominal pain for any changes. The following actions may help to alleviate any discomfort you are experiencing:  Only take over-the-counter or prescription medicines as directed by your health care provider.  Do not take laxatives unless directed to do so by your health care provider.  Try a clear liquid diet (broth, tea, or water) as directed by your health care provider. Slowly move to a bland diet as tolerated. SEEK MEDICAL CARE IF:  You have unexplained abdominal pain.  You have abdominal pain associated with nausea or diarrhea.  You have pain when you urinate or have a bowel movement.  You experience abdominal pain that wakes you in the night.  You have abdominal pain that is worsened or improved by eating food.  You have abdominal pain that is worsened with eating fatty foods.  You have a fever. SEEK IMMEDIATE MEDICAL CARE IF:   Your pain does not go away within 2 hours.  You keep throwing up (vomiting).  Your pain is felt only in portions of the abdomen, such as the right side or the left lower portion of the abdomen.  You pass bloody or black tarry stools. MAKE SURE YOU:  Understand these instructions.   Will watch your condition.   Will get help right away if you are not doing well or get worse.  Document Released: 02/20/2005 Document Revised: 05/18/2013 Document Reviewed: 01/20/2013 [facility] Patient Information 2015 [place], [place]. This information is not intended to replace advice given to you by your health care provider. Make sure you discuss any questions you have with your health care provider.

## 2014-11-03 NOTE — ED Notes (Signed)
[facility] presents with a 59 yo male from home with generalized abdominal pain for past 2 months.  Pt states that it feels different than it has been feeling tonight and that is why he is here.  Pt cannot effective describe how pain feels other than it is like a cramp.  Wife states that pt regurgitated earlier in the week but has not in the past 24 hours.  Denies diarrhea but does not know when he had last BM.  Sees cardiologist for CHF and HTN but no PCP.

## 2014-11-25 ENCOUNTER — Ambulatory Visit (INDEPENDENT_AMBULATORY_CARE_PROVIDER_SITE_OTHER): Payer: Medicare Other | Admitting: Internal Medicine

## 2014-11-25 ENCOUNTER — Encounter: Payer: Self-pay | Admitting: Internal Medicine

## 2014-11-25 VITALS — BP 132/96 | HR 81 | Ht 75.0 in | Wt 170.2 lb

## 2014-11-25 DIAGNOSIS — Z4502 Encounter for adjustment and management of automatic implantable cardiac defibrillator: Secondary | ICD-10-CM

## 2014-11-25 DIAGNOSIS — I5022 Chronic systolic (congestive) heart failure: Secondary | ICD-10-CM | POA: Diagnosis not present

## 2014-11-25 DIAGNOSIS — I428 Other cardiomyopathies: Secondary | ICD-10-CM

## 2014-11-25 DIAGNOSIS — I429 Cardiomyopathy, unspecified: Secondary | ICD-10-CM | POA: Diagnosis not present

## 2014-11-25 DIAGNOSIS — Z01812 Encounter for preprocedural laboratory examination: Secondary | ICD-10-CM

## 2014-11-25 LAB — CUP PACEART INCLINIC DEVICE CHECK
Battery Voltage: 2.61 V
Brady Statistic RV Percent Paced: 1.09 %
Date Time Interrogation Session: 20160701171054
HighPow Impedance: 42 Ohm
HighPow Impedance: 55 Ohm
Lead Channel Impedance Value: 424 Ohm
Lead Channel Pacing Threshold Amplitude: 1 V
Lead Channel Pacing Threshold Pulse Width: 0.4 ms
Lead Channel Sensing Intrinsic Amplitude: 6.0941
Lead Channel Setting Pacing Amplitude: 2.5 V
Lead Channel Setting Pacing Pulse Width: 0.4 ms
Lead Channel Setting Sensing Sensitivity: 0.3 mV
Zone Setting Detection Interval: 240 ms
Zone Setting Detection Interval: 300 ms
Zone Setting Detection Interval: 330 ms
Zone Setting Detection Interval: 400 ms

## 2014-11-25 NOTE — Patient Instructions (Signed)
Medication Instructions:  Your physician recommends that you continue on your current medications as directed. Please refer to the Current Medication list given to you today.  Labwork: Pre procedure labs to be scheduled when we schedule procedure  Testing/Procedures: Your physician has recommended that you have a defibrillator generator change and a lead revision.  [name] Phillips will contact you next week to arrange this procedure.  Follow-Up: To be determined once procedure date has been arranged.  Any Other Special Instructions Will Be Listed Below (If Applicable). Thank you for choosing [facility]!!

## 2014-11-25 NOTE — Progress Notes (Signed)
.      Patient Care Team: Donia Guiles, MD as PCP - General (Cardiology)   HPI  [name] Phillips is a 59 y.o. male Seen in followup for an ICD implanted years ago in the setting of nonischemic heart disease.  He was seen in hospital 6/15 because of an episode of syncope. He was admitted to hospital and developed supraventricular tachycardia with adenosine sensitive.  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  Echocardiogram 2011 demonstrated normal left ventricular function and a PFO  He was last seen 6/15. I do not see interval remote interrogation;  multiple letters were sent on behalf of the device clinic. He comes in today with his device having been beeping for 6 months.   He denies symptoms of chest pain shortness of breath peripheral edema. He has had no syncope.     Past Medical History  Diagnosis Date  . CVA (cerebral vascular accident)   . Hypertension   . CHF (congestive heart failure)   . Seizure   . Nonischemic cardiomyopathy     Catheterization 2005 normal coronary arteries; EF 15-20%;  2011 EF normal  . Alcohol abuse   . CHF (congestive heart failure)     Past Surgical History  Procedure Laterality Date  . Cardiac defibrillator placement  2007    MDT [employer] single chamber ICD implanted with 6949 lead by Dr Ty Hilts for primary prevention    Current Outpatient Prescriptions  Medication Sig Dispense Refill  . aspirin 325 MG tablet Take 325 mg by mouth daily.    Marland Kitchen atorvastatin (LIPITOR) 20 MG tablet Take 20 mg by mouth daily.  0  . carvedilol (COREG) 25 MG tablet Take 1 tablet (25 mg total) by mouth 2 (two) times daily with a meal. 60 tablet 0  . clopidogrel (PLAVIX) 75 MG tablet Take 1 tablet (75 mg total) by mouth daily with breakfast. 30 tablet 0  . OVER THE COUNTER MEDICATION Take 2 tablets by mouth daily. Testo Rip X supplement    . OVER THE COUNTER MEDICATION Take 2  tablets by mouth daily. Nitro Nos X supplement    . ramipril (ALTACE) 10 MG capsule Take 20 mg by mouth 2 (two) times daily.     Marland Kitchen spironolactone (ALDACTONE) 25 MG tablet Take 25 mg by mouth daily.     No current facility-administered medications for this visit.    Allergies  Allergen Reactions  . Codeine Hives  . Penicillins Hives  . Prednisone Hives    Review of Systems negative except from HPI and PMH  Physical Exam BP 132/96 mmHg  Pulse 81  Ht 6\' 3"  (1.905 m)  Wt 170 lb 3.2 oz (77.202 kg)  BMI 21.27 kg/m2 Well developed and nourished in no acute distress HENT normal Neck supple with JVP-flat Clear Regular rate and rhythm, no murmurs or gallops Abd-soft with active BS No Clubbing cyanosis edema Skin-warm and dry A & Oriented  Grossly normal sensory and motor function  Sinus rhythm at 73 Intervals 18/09/39 Left ventricular hypertrophy with repolarization abnormalities PACs  Assessment and  Plan  Nonischemic cardiac myopathy  SVT  Hypertension  Syncope unrelated to he arrhythmia  ICD-Medtronic  6949-lead   Device has reached ERI   We have reviewed the benefits and risks of generator replacement.  These include but are not limited to lead fracture and infection.  The patient understands, agrees and is willing to proceed.  Euvolemic continue current meds  Blood pressure is modestly elevated. We will follow her up at the hospital to see whether this is just a anomaly here

## 2014-11-30 ENCOUNTER — Telehealth: Payer: Self-pay | Admitting: *Deleted

## 2014-11-30 NOTE — Telephone Encounter (Signed)
lmtcb to schedule ICD generator change and lead revision.

## 2014-12-09 NOTE — Telephone Encounter (Signed)
Left another message to call back to arrange procedure.

## 2014-12-12 ENCOUNTER — Telehealth: Payer: Self-pay | Admitting: Internal Medicine

## 2014-12-12 NOTE — Telephone Encounter (Signed)
ICD generator change and [phone] lead revision scheduled for 12/19/14. Patient will have pre procedure labs done morning of procedure at the hospital. Wound check on 8/4. Reviewed instructions with patient:  Procedure; time to arrive at hospital morning of procedure (7:30 a.m.); NPO after MN the night before; pre procedure labs to be done in hospital morning of procedure; wash chest/neck w/ antibacterial soap; wound check on 8/4; no driving after procedure until cleared by device clinic at wound check appt. Patient verbalized understanding and agreeable to plan.

## 2014-12-12 NOTE — Telephone Encounter (Signed)
Follow Up ° ° ° ° ° ° ° ° °Pt returning Sherri's phone call.  °

## 2014-12-19 ENCOUNTER — Encounter (HOSPITAL_COMMUNITY)
Admission: RE | Disposition: A | Discharge status: Home / Self Care | Payer: Medicare Other | Source: Ambulatory Visit | Attending: Internal Medicine

## 2014-12-19 ENCOUNTER — Ambulatory Visit (HOSPITAL_COMMUNITY)
Admission: RE | Admit: 2014-12-19 | Discharge: 2014-12-20 | Disposition: A | Discharge status: Home / Self Care | Payer: Medicare Other | Source: Ambulatory Visit | Attending: Internal Medicine | Admitting: Internal Medicine

## 2014-12-19 ENCOUNTER — Encounter (HOSPITAL_COMMUNITY): Payer: Self-pay | Admitting: General Practice

## 2014-12-19 DIAGNOSIS — I5022 Chronic systolic (congestive) heart failure: Secondary | ICD-10-CM | POA: Diagnosis not present

## 2014-12-19 DIAGNOSIS — Z4502 Encounter for adjustment and management of automatic implantable cardiac defibrillator: Secondary | ICD-10-CM | POA: Diagnosis not present

## 2014-12-19 DIAGNOSIS — Z8673 Personal history of transient ischemic attack (TIA), and cerebral infarction without residual deficits: Secondary | ICD-10-CM | POA: Insufficient documentation

## 2014-12-19 DIAGNOSIS — Z959 Presence of cardiac and vascular implant and graft, unspecified: Secondary | ICD-10-CM

## 2014-12-19 DIAGNOSIS — I10 Essential (primary) hypertension: Secondary | ICD-10-CM | POA: Diagnosis not present

## 2014-12-19 DIAGNOSIS — Z7902 Long term (current) use of antithrombotics/antiplatelets: Secondary | ICD-10-CM | POA: Diagnosis not present

## 2014-12-19 DIAGNOSIS — G40909 Epilepsy, unspecified, not intractable, without status epilepticus: Secondary | ICD-10-CM | POA: Diagnosis not present

## 2014-12-19 DIAGNOSIS — R55 Syncope and collapse: Secondary | ICD-10-CM | POA: Diagnosis not present

## 2014-12-19 DIAGNOSIS — T82110A Breakdown (mechanical) of cardiac electrode, initial encounter: Secondary | ICD-10-CM | POA: Diagnosis not present

## 2014-12-19 DIAGNOSIS — I429 Cardiomyopathy, unspecified: Secondary | ICD-10-CM | POA: Insufficient documentation

## 2014-12-19 DIAGNOSIS — Z720 Tobacco use: Secondary | ICD-10-CM | POA: Diagnosis present

## 2014-12-19 DIAGNOSIS — Z880 Allergy status to penicillin: Secondary | ICD-10-CM | POA: Insufficient documentation

## 2014-12-19 HISTORY — PX: ICD GENERATOR CHANGE: SHX5854

## 2014-12-19 HISTORY — DX: Unspecified osteoarthritis, unspecified site: M19.90

## 2014-12-19 HISTORY — PX: EP IMPLANTABLE DEVICE: SHX172B

## 2014-12-19 LAB — COMPREHENSIVE METABOLIC PANEL
ALT: 28 U/L (ref 17–63)
AST: 28 U/L (ref 15–41)
Albumin: 3.7 g/dL (ref 3.5–5.0)
Alkaline Phosphatase: 132 U/L — ABNORMAL HIGH (ref 38–126)
Anion gap: 7 (ref 5–15)
BUN: 18 mg/dL (ref 6–20)
CO2: 23 mmol/L (ref 22–32)
Calcium: 9.2 mg/dL (ref 8.9–10.3)
Chloride: 110 mmol/L (ref 101–111)
Creatinine, Ser: 1.33 mg/dL — ABNORMAL HIGH (ref 0.61–1.24)
GFR calc Af Amer: >60 mL/min (ref >60)
GFR calc non Af Amer: 56 mL/min — ABNORMAL LOW (ref >60)
Glucose, Bld: 107 mg/dL — ABNORMAL HIGH (ref 65–99)
Potassium: 4.2 mmol/L (ref 3.5–5.1)
Sodium: 140 mmol/L (ref 135–145)
Total Bilirubin: 0.5 mg/dL (ref 0.3–1.2)
Total Protein: 6.9 g/dL (ref 6.5–8.1)

## 2014-12-19 LAB — CBC
HCT: 34.7 % — ABNORMAL LOW (ref 39.0–52.0)
Hemoglobin: 11.6 g/dL — ABNORMAL LOW (ref 13.0–17.0)
MCH: 29.9 pg (ref 26.0–34.0)
MCHC: 33.4 g/dL (ref 30.0–36.0)
MCV: 89.4 fL (ref 78.0–100.0)
Platelets: 171 10*3/uL (ref 150–400)
RBC: 3.88 MIL/uL — ABNORMAL LOW (ref 4.22–5.81)
RDW: 16.1 % — ABNORMAL HIGH (ref 11.5–15.5)
WBC: 4.1 10*3/uL (ref 4.0–10.5)

## 2014-12-19 LAB — PROTIME-INR
INR: 1.13 (ref 0.00–1.49)
Prothrombin Time: 14.7 seconds (ref 11.6–15.2)

## 2014-12-19 LAB — MRSA PCR SCREENING: MRSA by PCR: NEGATIVE

## 2014-12-19 SURGERY — ICD/BIV ICD GENERATOR CHANGEOUT

## 2014-12-19 MED ORDER — LIDOCAINE HCL (PF) 1 % IJ SOLN
INTRAMUSCULAR | Status: AC
  Filled 2014-12-19: qty 30

## 2014-12-19 MED ORDER — FENTANYL CITRATE (PF) 100 MCG/2ML IJ SOLN
INTRAMUSCULAR | Status: DC | PRN
  Administered 2014-12-19 (×2): 50 ug via INTRAVENOUS

## 2014-12-19 MED ORDER — MUPIROCIN 2 % EX OINT
TOPICAL_OINTMENT | CUTANEOUS | Status: AC
  Administered 2014-12-19: 1
  Filled 2014-12-19: qty 22

## 2014-12-19 MED ORDER — VANCOMYCIN HCL IN DEXTROSE 1-5 GM/200ML-% IV SOLN
1000.0000 mg | Freq: Two times a day (BID) | INTRAVENOUS | Status: AC
  Administered 2014-12-19: 1000 mg via INTRAVENOUS
  Filled 2014-12-19: qty 200

## 2014-12-19 MED ORDER — ASPIRIN 325 MG PO TABS
325.0000 mg | ORAL_TABLET | Freq: Every day | ORAL | Status: DC
  Administered 2014-12-20: 325 mg via ORAL
  Filled 2014-12-19: qty 1

## 2014-12-19 MED ORDER — MIDAZOLAM HCL 5 MG/5ML IJ SOLN
INTRAMUSCULAR | Status: AC
  Filled 2014-12-19: qty 5

## 2014-12-19 MED ORDER — SODIUM CHLORIDE 0.9 % IV SOLN: INTRAVENOUS | Status: AC

## 2014-12-19 MED ORDER — CARVEDILOL 25 MG PO TABS
25.0000 mg | ORAL_TABLET | Freq: Two times a day (BID) | ORAL | Status: DC
  Administered 2014-12-19 – 2014-12-20 (×2): 25 mg via ORAL
  Filled 2014-12-19 (×4): qty 1

## 2014-12-19 MED ORDER — VANCOMYCIN HCL IN DEXTROSE 1-5 GM/200ML-% IV SOLN
1000.0000 mg | INTRAVENOUS | Status: AC
  Administered 2014-12-19: 1000 mg via INTRAVENOUS
  Filled 2014-12-19: qty 200

## 2014-12-19 MED ORDER — CHLORHEXIDINE GLUCONATE 4 % EX LIQD: 60.0000 mL | Freq: Once | CUTANEOUS | Status: DC

## 2014-12-19 MED ORDER — ACETAMINOPHEN 325 MG PO TABS: 325.0000 mg | ORAL_TABLET | ORAL | Status: DC | PRN

## 2014-12-19 MED ORDER — ONDANSETRON HCL 4 MG/2ML IJ SOLN: 4.0000 mg | Freq: Four times a day (QID) | INTRAMUSCULAR | Status: DC | PRN

## 2014-12-19 MED ORDER — LIDOCAINE HCL (PF) 1 % IJ SOLN
INTRAMUSCULAR | Status: DC | PRN
  Administered 2014-12-19: 31 mL via INTRADERMAL

## 2014-12-19 MED ORDER — IOHEXOL 350 MG/ML SOLN
INTRAVENOUS | Status: DC | PRN
  Administered 2014-12-19: 15 mL via INTRAVENOUS

## 2014-12-19 MED ORDER — SODIUM CHLORIDE 0.9 % IR SOLN
Status: AC
  Filled 2014-12-19: qty 2

## 2014-12-19 MED ORDER — HEPARIN (PORCINE) IN NACL 2-0.9 UNIT/ML-% IJ SOLN
INTRAMUSCULAR | Status: DC | PRN
  Administered 2014-12-19: 10:00:00

## 2014-12-19 MED ORDER — SPIRONOLACTONE 25 MG PO TABS
25.0000 mg | ORAL_TABLET | Freq: Every day | ORAL | Status: DC
  Administered 2014-12-20: 25 mg via ORAL
  Filled 2014-12-19: qty 1

## 2014-12-19 MED ORDER — MIDAZOLAM HCL 5 MG/5ML IJ SOLN
INTRAMUSCULAR | Status: DC | PRN
  Administered 2014-12-19: 2 mg via INTRAVENOUS
  Administered 2014-12-19: 1 mg via INTRAVENOUS

## 2014-12-19 MED ORDER — RAMIPRIL 10 MG PO CAPS
10.0000 mg | ORAL_CAPSULE | Freq: Two times a day (BID) | ORAL | Status: DC
  Administered 2014-12-19 – 2014-12-20 (×2): 10 mg via ORAL
  Filled 2014-12-19 (×3): qty 1

## 2014-12-19 MED ORDER — CLOPIDOGREL BISULFATE 75 MG PO TABS
75.0000 mg | ORAL_TABLET | Freq: Every day | ORAL | Status: DC
  Administered 2014-12-20: 75 mg via ORAL
  Filled 2014-12-19 (×2): qty 1

## 2014-12-19 MED ORDER — FENTANYL CITRATE (PF) 100 MCG/2ML IJ SOLN
INTRAMUSCULAR | Status: AC
  Filled 2014-12-19: qty 2

## 2014-12-19 MED ORDER — SODIUM CHLORIDE 0.9 % IV SOLN
INTRAVENOUS | Status: DC
  Administered 2014-12-19: 09:00:00 via INTRAVENOUS

## 2014-12-19 MED ORDER — ATORVASTATIN CALCIUM 20 MG PO TABS
20.0000 mg | ORAL_TABLET | Freq: Every day | ORAL | Status: DC
  Administered 2014-12-20: 20 mg via ORAL
  Filled 2014-12-19: qty 1

## 2014-12-19 MED ORDER — SODIUM CHLORIDE 0.9 % IR SOLN
80.0000 mg | Status: AC
  Administered 2014-12-19: 80 mg

## 2014-12-19 SURGICAL SUPPLY — 7 items
CABLE SURGICAL S-101-97-12 (CABLE) ×3 IMPLANT
HEMOSTAT SURGICEL 2X4 FIBR (HEMOSTASIS) ×2 IMPLANT
ICD EVERA XT VR DVBB1D4 (ICD Generator) ×2 IMPLANT
LEAD SPRINT QUAT SEC 6935M-62 (Lead) ×2 IMPLANT
PAD DEFIB LIFELINK (PAD) ×3 IMPLANT
SHEATH CLASSIC 9F (SHEATH) ×2 IMPLANT
TRAY PACEMAKER INSERTION (CUSTOM PROCEDURE TRAY) ×3 IMPLANT

## 2014-12-19 NOTE — Interval H&P Note (Signed)
ICD Criteria  Current LVEF:25%. Within 12 months prior to implant: No   Heart failure history: Yes, Class II  Cardiomyopathy history: Yes, Non-Ischemic Cardiomyopathy.  Atrial Fibrillation/Atrial Flutter: No.  Ventricular tachycardia history: No.  Cardiac arrest history: No.  History of syndromes with risk of sudden death: No.  Previous ICD: Yes, Reason for ICD:  Primary prevention.  Current ICD indication: Primary  PPM indication: No.  Beta Blocker therapy for 3 or more months: Yes, prescribed.   Ace Inhibitor/ARB therapy for 3 or more months: Yes, prescribed.   History and Physical Interval Note:  12/19/2014 8:15 AM  Beryle Beams  has presented today for surgery, with the diagnosis of eri  The various methods of treatment have been discussed with the patient and family. After consideration of risks, benefits and other options for treatment, the patient has consented to  Procedure(s):  ICD Generator Changeout (N/A) Lead Revision (N/A) as a surgical intervention .  The patient's history has been reviewed, patient examined, no change in status, stable for surgery.  I have reviewed the patient's chart and labs.  Questions were answered to the patient's satisfaction.     Sherryl Manges

## 2014-12-19 NOTE — H&P (View-Only) (Signed)
.      Patient Care Team: Donia Guiles, MD as PCP - General (Cardiology)   HPI  [name] Phillips is a 59 y.o. male Seen in followup for an ICD implanted years ago in the setting of nonischemic heart disease.  He was seen in hospital 6/15 because of an episode of syncope. He was admitted to hospital and developed supraventricular tachycardia with adenosine sensitive.  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  Echocardiogram 2011 demonstrated normal left ventricular function and a PFO  He was last seen 6/15. I do not see interval remote interrogation;  multiple letters were sent on behalf of the device clinic. He comes in today with his device having been beeping for 6 months.   He denies symptoms of chest pain shortness of breath peripheral edema. He has had no syncope.     Past Medical History  Diagnosis Date  . CVA (cerebral vascular accident)   . Hypertension   . CHF (congestive heart failure)   . Seizure   . Nonischemic cardiomyopathy     Catheterization 2005 normal coronary arteries; EF 15-20%;  2011 EF normal  . Alcohol abuse   . CHF (congestive heart failure)     Past Surgical History  Procedure Laterality Date  . Cardiac defibrillator placement  2007    MDT [employer] single chamber ICD implanted with 6949 lead by Dr Ty Hilts for primary prevention    Current Outpatient Prescriptions  Medication Sig Dispense Refill  . aspirin 325 MG tablet Take 325 mg by mouth daily.    Marland Kitchen atorvastatin (LIPITOR) 20 MG tablet Take 20 mg by mouth daily.  0  . carvedilol (COREG) 25 MG tablet Take 1 tablet (25 mg total) by mouth 2 (two) times daily with a meal. 60 tablet 0  . clopidogrel (PLAVIX) 75 MG tablet Take 1 tablet (75 mg total) by mouth daily with breakfast. 30 tablet 0  . OVER THE COUNTER MEDICATION Take 2 tablets by mouth daily. Testo Rip X supplement    . OVER THE COUNTER MEDICATION Take 2  tablets by mouth daily. Nitro Nos X supplement    . ramipril (ALTACE) 10 MG capsule Take 20 mg by mouth 2 (two) times daily.     Marland Kitchen spironolactone (ALDACTONE) 25 MG tablet Take 25 mg by mouth daily.     No current facility-administered medications for this visit.    Allergies  Allergen Reactions  . Codeine Hives  . Penicillins Hives  . Prednisone Hives    Review of Systems negative except from HPI and PMH  Physical Exam BP 132/96 mmHg  Pulse 81  Ht 6\' 3"  (1.905 m)  Wt 170 lb 3.2 oz (77.202 kg)  BMI 21.27 kg/m2 Well developed and nourished in no acute distress HENT normal Neck supple with JVP-flat Clear Regular rate and rhythm, no murmurs or gallops Abd-soft with active BS No Clubbing cyanosis edema Skin-warm and dry A & Oriented  Grossly normal sensory and motor function  Sinus rhythm at 73 Intervals 18/09/39 Left ventricular hypertrophy with repolarization abnormalities PACs  Assessment and  Plan  Nonischemic cardiac myopathy  SVT  Hypertension  Syncope unrelated to he arrhythmia  ICD-[employer]  6949-lead   Device has reached ERI   We have reviewed the benefits and risks of generator replacement.  These include but are not limited to lead fracture and infection.  The patient understands, agrees and is willing to proceed.  Euvolemic continue current meds  Blood pressure is modestly elevated. We will follow her up at the hospital to see whether this is just a anomaly here  

## 2014-12-20 ENCOUNTER — Ambulatory Visit (HOSPITAL_COMMUNITY): Payer: Medicare Other

## 2014-12-20 ENCOUNTER — Encounter (HOSPITAL_COMMUNITY): Payer: Self-pay | Admitting: Nurse Practitioner

## 2014-12-20 DIAGNOSIS — I429 Cardiomyopathy, unspecified: Secondary | ICD-10-CM | POA: Diagnosis not present

## 2014-12-20 DIAGNOSIS — Z880 Allergy status to penicillin: Secondary | ICD-10-CM | POA: Diagnosis not present

## 2014-12-20 DIAGNOSIS — I10 Essential (primary) hypertension: Secondary | ICD-10-CM | POA: Diagnosis not present

## 2014-12-20 DIAGNOSIS — I5022 Chronic systolic (congestive) heart failure: Secondary | ICD-10-CM | POA: Diagnosis not present

## 2014-12-20 MED FILL — Sodium Chloride Irrigation Soln 0.9%: Qty: 500 | Status: AC

## 2014-12-20 MED FILL — Gentamicin Sulfate Inj 40 MG/ML: INTRAMUSCULAR | Qty: 2 | Status: AC

## 2014-12-20 NOTE — Progress Notes (Signed)
Patient Name: [name] Phillips Date of Encounter: 12/20/2014    Principal Problem:   NICM (nonischemic cardiomyopathy) Active Problems:   Tobacco abuse   HTN (hypertension)   Chronic systolic CHF (congestive heart failure)   SUBJECTIVE  No dyspnea. Right chest wall mildly tender.  Ambulating w/o difficulty.  CURRENT MEDS . aspirin  325 mg Oral Daily  . atorvastatin  20 mg Oral Daily  . carvedilol  25 mg Oral BID WC  . clopidogrel  75 mg Oral Q breakfast  . ramipril  10 mg Oral BID  . spironolactone  25 mg Oral Daily    OBJECTIVE  Filed Vitals:   12/19/14 1711 12/19/14 1947 12/20/14 0419 12/20/14 0840  BP: 146/86 159/95 148/100 121/80  Pulse: 63 51 61 61  Temp:  97.5 F (36.4 C) 98 F (36.7 C) 97.6 F (36.4 C)  TempSrc:  Oral Oral Oral  Resp:  14 16   Height:      Weight:      SpO2: 100% 99% 97% 100%    Intake/Output Summary (Last 24 hours) at 12/20/14 0936 Last data filed at 12/20/14 0840  Gross per 24 hour  Intake   1080 ml  Output      0 ml  Net   1080 ml   Filed Weights   12/19/14 0802  Weight: 178 lb (80.74 kg)    PHYSICAL EXAM  General: Pleasant, NAD. Neuro: Alert and oriented X 3. Moves all extremities spontaneously. Psych: Normal affect. HEENT:  Normal  Neck: Supple without bruits or JVD. Lungs:  Resp regular and unlabored, CTA. Heart: RRR no s3, s4, or murmurs. R chest wall dsg d/i. No hematoma. Abdomen: Soft, non-tender, non-distended, BS + x 4.  Extremities: No clubbing, cyanosis or edema. DP/PT/Radials 2+ and equal bilaterally.  Accessory Clinical Findings  CBC  Recent Labs  12/19/14 0919  WBC 4.1  HGB 11.6*  HCT 34.7*  MCV 89.4  PLT 171   Basic Metabolic Panel  Recent Labs  12/19/14 0836  NA 140  K 4.2  CL 110  CO2 23  GLUCOSE 107*  BUN 18  CREATININE 1.33*  CALCIUM 9.2   Liver Function Tests  Recent Labs  12/19/14 0836  AST 28  ALT 28  ALKPHOS 132*  BILITOT 0.5  PROT 6.9  ALBUMIN 3.7    TELE  rsr  ECG  RSR, 63, pac's, lvh, poor r prog, lae.  Radiology/Studies  Dg Chest 2 View  12/20/2014   CLINICAL DATA:  Implantable defibrillator pacemaker generator change out on December 19, 2014  EXAM: CHEST  2 VIEW  COMPARISON:  Portable chest x-ray of January 02, 2014  FINDINGS: The lungs are well-expanded. There is no pneumothorax or pleural effusion. The heart is top-normal in size but stable. The pulmonary vascularity is not engorged. A new pacemaker generator has been placed over the right pectoral region. There are now 2 electrodes present. The bony thorax is unremarkable.  IMPRESSION: There is no postprocedure complication following pacemaker/defibrillator generator change out and additional electrode placement.   Electronically Signed   By: David  [place] M.D.   On: 12/20/2014 08:16    ASSESSMENT AND PLAN  1.  Chronic systolic CHF/NICM:  Euvolemic on exam.  S/p ICD upgrade 2/2 ERI.  Site looks good.  CXR w/o ptx.  Plan d/c today after seen by MDT.  F/u in office in 7-10 days for wound check.  Cont bb/acei/statin.  Sees Dr. Sharyn Lull for CHF mgmt as outpt.  2.  Essential HTN:  BP trended high overnight.  Better this AM.  Cont bb/acei.  3.  Tob Abuse:  Cessation advised.  4.  HL:  Cont statin.  Followed by Dr. Sharyn Lull.  Signed, Nicolasa Ducking NP   I have seen, examined the patient, and reviewed the above assessment and plan. On exam, device pocket is without hematoma.  CXR reveals stable lead, no ptx.  Device interrogation is reviewed and normal.  Changes to above are made where necessary.    Co Sign: Hillis Range, MD 12/20/2014 11:51 AM

## 2014-12-20 NOTE — Care Management Note (Signed)
Case Management Note  Patient Details  Name: [name] Phillips MRN: [id number] Date of Birth: 09-13-1955  Subjective/Objective:    Pt admitted with nonischemic cardiomyopathy                Action/Plan:  Pt is independent from home with significant other.  Pt requesting medication assistance for Ramipril due to increase dosage and limited at home quantity.  CM will assist with medication concerns within parameters; pt has prescription insurance coverage.   Expected Discharge Date:                  Expected Discharge Plan:  Home/Self Care  In-House Referral:  Clinical Social Work  Discharge planning Services  CM Consult, Medication Assistance  Post Acute Care Choice:    Choice offered to:     DME Arranged:    DME Agency:     HH Arranged:    HH Agency:     Status of Service:  Completed, signed off  Medicare Important Message Given:    Date Medicare IM Given:    Medicare IM give by:    Date Additional Medicare IM Given:    Additional Medicare Important Message give by:     If discussed at Long Length of Stay Meetings, dates discussed:    Additional Comments: CM assessed pt; pt is independent from home.  Pt informed CM that he gets his medications via mail order from [facility] out of [place].  Pt states that his concern is due to limited quanity of home med due to recent increase in dosage per MD.  Pt stated that he will only have a few days of coverage upon discharge from hospital.  CM contacted Kerry Hough with [facility] pharmacy and was told that he already had the order; however two outstanding issues were stopping the processing of the order; patient owes back co pays totalling at approxitmatly $22, also pt has been unreachable for pharmacy.  CM talked with pt and requested that CM call pharmacy at bedside, pt refused, CM requested pt to contact pharmacy while she was in the room, pt refused.  Pt stated would call the pharmacy.  CM informed pt that Kerry Hough stated that if those  two issues where cleared up early today, medication could be shipped to his home tomorrow.  Pt acknowledge nurse request and stated he would call.  Significant other at bedside.  Pt told CM that he was going to walk home as it wasn't very far.  CM educated pt regarding proper transportation home post discharge, pt stated that he would ride the bus home if he was able to get a bus pass.  CM consulted SW, bus pass will be given to pt.  No other CM. Cherylann Parr, RN 12/20/2014, 12:08 PM

## 2014-12-20 NOTE — Discharge Instructions (Signed)
**  PLEASE REMEMBER TO BRING ALL OF YOUR MEDICATIONS TO EACH OF YOUR FOLLOW-UP OFFICE VISITS. _____________        Supplemental Discharge Instructions for  Pacemaker/Defibrillator Patients  Activity No heavy lifting or vigorous activity with your left/right arm for 6 to 8 weeks.  Do not raise your left/right arm above your head for one week.  Gradually raise your affected arm as drawn below.                    7.31          /       8.1          /        8.2      /        8.3       NO DRIVING for      ; you may begin driving on      8.3    . WOUND CARE - Keep the wound area clean and dry.  Do not get this area wet for one week. No showers for one week; you may shower on     8.3     . - The tape/steri-strips on your wound will fall off; do not pull them off.  No bandage is needed on the site.  DO  NOT apply any creams, oils, or ointments to the wound area. - If you notice any drainage or discharge from the wound, any swelling or bruising at the site, or you develop a fever > 101? F after you are discharged home, call the office at once.  Special Instructions - You are still able to use cellular telephones; use the ear opposite the side where you have your pacemaker/defibrillator.  Avoid carrying your cellular phone near your device. - When traveling through airports, show security personnel your identification card to avoid being screened in the metal detectors.  Ask the security personnel to use the hand wand. - Avoid arc welding equipment, MRI testing (magnetic resonance imaging), TENS units (transcutaneous nerve stimulators).  Call the office for questions about other devices. - Avoid electrical appliances that are in poor condition or are not properly grounded. - Microwave ovens are safe to be near or to operate.  Additional information for defibrillator patients should your device go off: - If your device goes off ONCE and you feel fine afterward, notify the device clinic  nurses. - If your device goes off ONCE and you do not feel well afterward, call 911. - If your device goes off TWICE, call 911. - If your device goes off THREE times in one day, call 911.  DO NOT DRIVE YOURSELF OR A FAMILY MEMBER WITH A DEFIBRILLATOR TO THE HOSPITAL--CALL 911.

## 2014-12-20 NOTE — Progress Notes (Signed)
Pt discharge education and instructions completed with pt and wife at bedside; both voices understanding and denies any questions. Pt IV and telemetry removed; pt discharge home with family to transport him home. Pt incision dsg clean, dry and intact except old marked drainage remains unchanged. Pt sling remains on to RUE; pt transported off unit via wheelchair with wife and belongings to the side. Arabella Merles Jenet Durio RN.

## 2014-12-20 NOTE — Discharge Summary (Signed)
Discharge Summary   Patient ID: [name] Phillips,  MRN: [id number], DOB/AGE: 12-19-1955 59 y.o.  Admit date: 12/19/2014 Discharge date: 12/20/2014  Primary Care Provider: Pola Corn Primary Cardiologist: Judie Petit. Sharyn Lull, MD / S. Graciela Husbands, MD   Discharge Diagnoses Principal Problem:   NICM (nonischemic cardiomyopathy)  **S/P MDT single lead AICD upgrade this admission. Active Problems:   Tobacco abuse   HTN (hypertension)   Chronic systolic CHF (congestive heart failure)  Allergies Allergies  Allergen Reactions  . Codeine Hives  . Penicillins Hives  . Prednisone Hives    Procedures  AICD Implantation 7.25.2016  Medtronic  ICD, serial number  [id number] _____________   History of Present Illness  59 y/o male with a h/o NICM s/p single lead AICD (7829 lead), HTN, CKD, and prior CVA.  He was recently seen in EP clinic on 7/1 secondary to ICD device beeping.  Interrogation revealed that elective replacement indicators had been reached and he was subsequently scheduled for ICD upgrade.  Hospital Course  Patient presented to the [facility] EP lab on 7/25 and underwent successful placement of a Medtronic single lead AICD, with upgrade of RV lead as well.  He tolerated the procedure well and post-procedure CXR shows no evidence of pneumothorax.  He has been ambulating this AM without difficulty and will be discharged home today in good condition.  We will see him back in device clinic on 8/4.  Discharge Vitals Blood pressure 128/90, pulse 62, temperature 97.6 F (36.4 C), temperature source Oral, resp. rate 16, height 6\' 3"  (1.905 m), weight 178 lb (80.74 kg), SpO2 100 %.  Filed Weights   12/19/14 0802  Weight: 178 lb (80.74 kg)    Labs  CBC  Recent Labs  12/19/14 0919  WBC 4.1  HGB 11.6*  HCT 34.7*  MCV 89.4  PLT 171   Basic Metabolic Panel  Recent Labs  12/19/14 0836  NA 140  K 4.2  CL 110  CO2 23  GLUCOSE 107*  BUN 18  CREATININE 1.33*  CALCIUM 9.2   Liver  Function Tests  Recent Labs  12/19/14 0836  AST 28  ALT 28  ALKPHOS 132*  BILITOT 0.5  PROT 6.9  ALBUMIN 3.7   Disposition  Pt is being discharged home today in good condition.  Follow-up Plans & Appointments  Follow-up Information    Follow up with [facility] On 12/29/2014.   Why:  9:30 AM   Contact information:   [address] [place] [address] [phone]      Follow up with Rinaldo Cloud, MD.   Specialty:  Cardiology   Why:  as scheduled.   Contact information:   [address] [place] [address] [phone]       Follow up with Sherryl Manges, MD On 03/23/2015.   Specialty:  Cardiology   Why:  2:15 PM   Contact information:   [address] [place] [address] [phone]       Discharge Medications    Medication List    TAKE these medications        aspirin 325 MG tablet  Take 325 mg by mouth daily.     atorvastatin 20 MG tablet  Commonly known as:  LIPITOR  Take 20 mg by mouth daily.     carvedilol 25 MG tablet  Commonly known as:  COREG  Take 1 tablet (25 mg total) by mouth 2 (two) times daily  with a meal.     clopidogrel 75 MG tablet  Commonly known as:  PLAVIX  Take 1 tablet (75 mg total) by mouth daily with breakfast.     ramipril 10 MG capsule  Commonly known as:  ALTACE  Take 10 mg by mouth 2 (two) times daily.     spironolactone 25 MG tablet  Commonly known as:  ALDACTONE  Take 25 mg by mouth daily.       Outstanding Labs/Studies  None  Duration of Discharge Encounter   Greater than 30 minutes including physician time.  Signed, Nicolasa Ducking NP 12/20/2014, 9:52 AM    Hillis Range MD, [facility] 12/20/2014 8:48 PM

## 2014-12-26 ENCOUNTER — Telehealth: Payer: Self-pay | Admitting: Internal Medicine

## 2014-12-26 NOTE — Telephone Encounter (Signed)
Pt having sharp pains from device pocket resonating to armpit. States pain is like a pin coming from device pocket. States pain did not start until getting his new ICD. He states pain is quick, longest ~4sec. States pain rapidly comes then goes away. I explained he is likely experiencing nerve healing from his changeout surgery. I did explain to pt if he is having prolonged chest pain accompanied w/ SOB that he needs to dial 911. Pt understands there may different types of chest pain caused by different mechanisms in different locations. Pt aware best solution is to change his position. Pt will update [place] on Thur of frequency of pain events (if decreasing or remaining the same).   Pt aware ROV w/ device clinic 12/29/14.

## 2014-12-26 NOTE — Telephone Encounter (Signed)
New message     Pt had ICD put in on last Friday.  He states he has had sharp pain in chest since last Friday. Please advise

## 2014-12-29 ENCOUNTER — Ambulatory Visit (INDEPENDENT_AMBULATORY_CARE_PROVIDER_SITE_OTHER): Payer: Medicare Other | Admitting: *Deleted

## 2014-12-29 DIAGNOSIS — I5022 Chronic systolic (congestive) heart failure: Secondary | ICD-10-CM

## 2014-12-29 DIAGNOSIS — Z95810 Presence of automatic (implantable) cardiac defibrillator: Secondary | ICD-10-CM

## 2014-12-29 DIAGNOSIS — I429 Cardiomyopathy, unspecified: Secondary | ICD-10-CM

## 2014-12-29 DIAGNOSIS — I428 Other cardiomyopathies: Secondary | ICD-10-CM

## 2014-12-29 LAB — CUP PACEART INCLINIC DEVICE CHECK
Battery Remaining Longevity: 136 mo
Battery Voltage: 3.08 V
Brady Statistic RV Percent Paced: 0.01 %
Date Time Interrogation Session: 20160804095420
HighPow Impedance: 190 Ohm
HighPow Impedance: 63 Ohm
Lead Channel Impedance Value: 456 Ohm
Lead Channel Pacing Threshold Amplitude: 0.75 V
Lead Channel Pacing Threshold Pulse Width: 0.4 ms
Lead Channel Sensing Intrinsic Amplitude: 11.125 mV
Lead Channel Sensing Intrinsic Amplitude: 11.5 mV
Lead Channel Setting Pacing Amplitude: 3.5 V
Lead Channel Setting Pacing Pulse Width: 0.4 ms
Lead Channel Setting Sensing Sensitivity: 0.3 mV
Zone Setting Detection Interval: 300 ms
Zone Setting Detection Interval: 330 ms
Zone Setting Detection Interval: 380 ms

## 2014-12-29 NOTE — Progress Notes (Signed)
Changeout & lead revision wound check appointment. Tegaderm removed, no steri-strips, dermabond used. Wound without redness or edema. Incision edges approximated, wound well healed. Normal device function. Threshold, sensing, and impedances consistent with implant measurements. Device programmed at 3.5V for extra safety margin until 3 month visit. Histogram distribution appropriate for patient and level of activity. No ventricular arrhythmias noted. Patient educated about wound care, arm mobility, lifting restrictions, shock plan. ROV w/ SK 03/23/15.

## 2015-02-01 ENCOUNTER — Encounter: Payer: Self-pay | Admitting: Internal Medicine

## 2015-02-14 ENCOUNTER — Encounter: Payer: Self-pay | Admitting: Internal Medicine

## 2015-03-23 ENCOUNTER — Encounter: Payer: Medicare Other | Admitting: Internal Medicine

## 2015-03-31 ENCOUNTER — Encounter: Payer: Medicare Other | Admitting: Internal Medicine

## 2015-04-03 ENCOUNTER — Encounter: Payer: Self-pay | Admitting: Internal Medicine

## 2015-05-01 ENCOUNTER — Encounter: Payer: Self-pay | Admitting: Internal Medicine

## 2015-05-01 ENCOUNTER — Ambulatory Visit (INDEPENDENT_AMBULATORY_CARE_PROVIDER_SITE_OTHER): Payer: Medicare Other | Admitting: Internal Medicine

## 2015-05-01 VITALS — BP 163/109 | HR 70 | Temp 97.8°F | Ht 75.0 in | Wt 172.9 lb

## 2015-05-01 DIAGNOSIS — Z0000 Encounter for general adult medical examination without abnormal findings: Secondary | ICD-10-CM

## 2015-05-01 DIAGNOSIS — I5022 Chronic systolic (congestive) heart failure: Secondary | ICD-10-CM

## 2015-05-01 DIAGNOSIS — F1099 Alcohol use, unspecified with unspecified alcohol-induced disorder: Secondary | ICD-10-CM | POA: Diagnosis not present

## 2015-05-01 DIAGNOSIS — I10 Essential (primary) hypertension: Secondary | ICD-10-CM | POA: Diagnosis not present

## 2015-05-01 DIAGNOSIS — F1010 Alcohol abuse, uncomplicated: Secondary | ICD-10-CM

## 2015-05-01 DIAGNOSIS — Z23 Encounter for immunization: Secondary | ICD-10-CM

## 2015-05-01 DIAGNOSIS — F5221 Male erectile disorder: Secondary | ICD-10-CM | POA: Insufficient documentation

## 2015-05-01 DIAGNOSIS — F528 Other sexual dysfunction not due to a substance or known physiological condition: Secondary | ICD-10-CM

## 2015-05-01 DIAGNOSIS — F1090 Alcohol use, unspecified, uncomplicated: Secondary | ICD-10-CM

## 2015-05-01 DIAGNOSIS — IMO0002 Reserved for concepts with insufficient information to code with codable children: Secondary | ICD-10-CM

## 2015-05-01 MED ORDER — SILDENAFIL CITRATE 50 MG PO TABS: 50.0000 mg | ORAL_TABLET | ORAL | Status: DC | PRN

## 2015-05-01 MED ORDER — SPIRONOLACTONE 25 MG PO TABS: 25.0000 mg | ORAL_TABLET | Freq: Every day | ORAL | Status: DC

## 2015-05-01 NOTE — Assessment & Plan Note (Signed)
Discussed appropriateness of flu shot and patient agreed to having this. However, he left clinic before administration of influenza vaccine. Recommend administering at next visit.

## 2015-05-01 NOTE — Assessment & Plan Note (Addendum)
Assessment: Patient has a severe (LVEF ~25%) systolic congestive heart failure due to nonischemic cardiac myopathy from chronic alcohol and crack cocaine abuse. He has not used cocaine for approximately 9 years, however used for about 5-10 years before quitting. He continues to be an every day smoker of one half pack per day and drinker approximately 3 beers per day. He does not have any edema or signs of volume overload. He does have dyspnea on strenuous activity but is able to walk without difficulty. He is well followed by [facility] cardiology. His blood pressure was adequately controlled earlier this year on Coreg, ramipril, spironolactone. However he currently does not have prescriptions active at his pharmacy for spironolactone, atorvastatin, or clopidogrel so he is off these medications.  Bmet not obtained today due to patient leaving early, will follow this up at next appointment.  Plan: Refill prescriptions for spironolactone, atorvastatin, and clopidogrel Patient will need follow-up visit with labs for monitoring potential hyperkalemia Recommend follow-up at 2 weeks for blood pressure recheck

## 2015-05-01 NOTE — Progress Notes (Signed)
Subjective:   Patient ID: [name] Phillips male   DOB: 01-18-1956 59 y.o.   MRN: [id number]  HPI: [name] is a 59 y.o. with past medical history as described below presenting to establish with a new primary care doctor and refill medications for his chronic systolic congestive heart failure.  See problem based assessment and plan below for additional details.   Past Medical History  Diagnosis Date  . CVA (cerebral vascular accident) (HCC)   . Hypertension   . CHF (congestive heart failure) (HCC)   . Seizure (HCC)   . Nonischemic cardiomyopathy (HCC)     a. Catheterization 2005 normal coronary arteries; EF 15-20%; b. 2011 EF normal;  c. 2015 Echo: EF 25-30%;  d. s/p prior AICD with upgrade to MDT single lead ICD ser # [id number] h 2/2 ERI &12/19/2014).  . Alcohol abuse   . AICD (automatic cardioverter/defibrillator) present 08/08/2011  . Arthritis    Current Outpatient Prescriptions  Medication Sig Dispense Refill  . aspirin 325 MG tablet Take 325 mg by mouth daily.    Marland Kitchen atorvastatin (LIPITOR) 20 MG tablet Take 20 mg by mouth daily.  0  . carvedilol (COREG) 25 MG tablet Take 1 tablet (25 mg total) by mouth 2 (two) times daily with a meal. 60 tablet 0  . clopidogrel (PLAVIX) 75 MG tablet Take 1 tablet (75 mg total) by mouth daily with breakfast. 30 tablet 0  . ramipril (ALTACE) 10 MG capsule Take 10 mg by mouth 2 (two) times daily.     . sildenafil (VIAGRA) 50 MG tablet Take 1 tablet (50 mg total) by mouth as needed for erectile dysfunction. 20 tablet 1  . spironolactone (ALDACTONE) 25 MG tablet Take 1 tablet (25 mg total) by mouth daily. 30 tablet 1   No current facility-administered medications for this visit.   Family History  Problem Relation Age of Onset  . Heart disease Father   . Hypertension     Social History   Social History  . Marital Status: Married    Spouse Name: N/A  . Number of Children: N/A  . Years of Education: N/A   Social History Main Topics  .  Smoking status: Current Every Day Smoker -- 0.50 packs/day for 39 years    Types: Cigarettes  . Smokeless tobacco: Former Neurosurgeon    Types: Snuff, Chew     Comment: Or less.  . Alcohol Use: 1.2 oz/week    2 Cans of beer per week  . Drug Use: No  . Sexual Activity: Not Asked   Other Topics Concern  . None   Social History Narrative   Review of Systems: Review of Systems  Constitutional: Negative for fever, chills and weight loss.  Eyes: Negative for blurred vision and double vision.  Respiratory: Negative for cough.   Cardiovascular: Negative for chest pain.  Gastrointestinal: Negative for blood in stool.  Musculoskeletal: Negative for falls.  Neurological: Negative for dizziness, seizures and headaches.  Psychiatric/Behavioral: Positive for substance abuse.    Objective:  Physical Exam: Filed Vitals:   05/01/15 0935  BP: 163/109  Pulse: 70  Temp: 97.8 F (36.6 C)  TempSrc: Oral  Height: 6\' 3"  (1.905 m)  Weight: 172 lb 14.4 oz (78.427 kg)  SpO2: 99%   GENERAL- alert, co-operative, NAD HEENT- Atraumatic, moist oral mucosa, edentulous with dentures in place CARDIAC- RRR, Systolic ejection murmur RESP- CTAB, no wheezes or crackles. ABDOMEN- Soft, nontender, mildly protuberant abdomen, no guarding or rebound NEURO- Strength lower  extremities- 5/5, Sensation intact- globally, Gait- Normal. EXTREMITIES- Symmetric, no pedal edema. SKIN- Warm, dry, No rash or lesion. PSYCH- Normal mood and affect, appropriate thought content and speech.  Assessment & Plan:

## 2015-05-01 NOTE — Assessment & Plan Note (Addendum)
Patient has erectile dysfunction which he states is significantly worsened by his 25 mg twice a day Coreg. He has taken Viagra in the past without significant side effects. He does not take and has never taken nitrates at home.   Will prescribe an initial dose of sildenafil 50mg  as needed with instruction to titrate dose down or stop and call the clinic if he experiences side effects.

## 2015-05-01 NOTE — Patient Instructions (Addendum)
Today we refilled your blood pressure medication. We will need to see you again soon to recheck how your pressure is controlled after being back on this medication. We also need to check your blood work.  Prescribed Viagra (sildenafil) for erectile dysfunction that is probably contributed to by your high dose of carvedilol. Start with trying 50mg  up to 4 hours before sexual activity. If you notice side effects including dizziness, vision changes, or weakness you may try having the dose to 25mg  or discontinuing this medication and contact our clinic.  Please call at [phone] during business hours with any trouble you have getting your medications or if you have new questions for [place].

## 2015-05-01 NOTE — Assessment & Plan Note (Signed)
Patient has a long-standing history of alcohol abuse but he states used to be mostly moonshine or liquor but now drinks just approximately 3 beers daily. Does have a history of seizures in the past most recently about 18 months ago. The seizures have been associated with his alcohol use, according to his wife "when he was really drinking a lot and not eating or drinking anything else."  Counseled patient on association between his chronic alcohol use and his nonischemic cardiomyopathy. This will be a long-term goal. He does not see drinking 3 beers daily as a significant problem. He seemed pre-contemplative of alcohol cessation at this time.

## 2015-05-02 NOTE — Addendum Note (Signed)
Addended by: Fuller Plan on: 05/02/2015 11:50 AM   Modules accepted: Orders

## 2015-05-09 NOTE — Progress Notes (Signed)
Internal Medicine Clinic Attending  I saw and evaluated the patient.  I personally confirmed the key portions of the history and exam documented by Dr. Rice and I reviewed pertinent patient test results.  The assessment, diagnosis, and plan were formulated together and I agree with the documentation in the resident's note.  

## 2015-05-09 NOTE — Addendum Note (Signed)
Addended by: Debe Coder B on: 05/09/2015 03:11 PM   Modules accepted: Level of Service

## 2015-05-24 ENCOUNTER — Ambulatory Visit: Payer: Medicare Other | Admitting: Internal Medicine

## 2015-05-31 ENCOUNTER — Encounter (HOSPITAL_COMMUNITY): Payer: Self-pay | Admitting: *Deleted

## 2015-05-31 DIAGNOSIS — I509 Heart failure, unspecified: Secondary | ICD-10-CM | POA: Diagnosis not present

## 2015-05-31 DIAGNOSIS — F17210 Nicotine dependence, cigarettes, uncomplicated: Secondary | ICD-10-CM | POA: Diagnosis not present

## 2015-05-31 DIAGNOSIS — R040 Epistaxis: Secondary | ICD-10-CM | POA: Insufficient documentation

## 2015-05-31 DIAGNOSIS — I10 Essential (primary) hypertension: Secondary | ICD-10-CM | POA: Insufficient documentation

## 2015-05-31 NOTE — ED Notes (Signed)
Called pt for vital recheck, no answer.

## 2015-05-31 NOTE — ED Notes (Addendum)
Pt arrives via EMS to triage. States that he has been off of his BP meds x 1 month. p started having a nose bleed today. Sates that he filled his BP meds today and took them around 1400. Bleeding is controlled at this time.

## 2015-06-01 ENCOUNTER — Ambulatory Visit (INDEPENDENT_AMBULATORY_CARE_PROVIDER_SITE_OTHER): Payer: Medicare Other | Admitting: Pulmonary Disease

## 2015-06-01 ENCOUNTER — Encounter: Payer: Self-pay | Admitting: Pulmonary Disease

## 2015-06-01 ENCOUNTER — Emergency Department (HOSPITAL_COMMUNITY)
Admission: EM | Admit: 2015-06-01 | Discharge: 2015-06-01 | Discharge status: Against Medical Advice | Payer: Medicare Other | Attending: Emergency Medicine | Admitting: Emergency Medicine

## 2015-06-01 VITALS — BP 155/119 | HR 98 | Temp 97.5°F | Ht 75.0 in | Wt 170.7 lb

## 2015-06-01 DIAGNOSIS — I10 Essential (primary) hypertension: Secondary | ICD-10-CM

## 2015-06-01 DIAGNOSIS — F5221 Male erectile disorder: Secondary | ICD-10-CM

## 2015-06-01 DIAGNOSIS — R040 Epistaxis: Secondary | ICD-10-CM | POA: Diagnosis not present

## 2015-06-01 DIAGNOSIS — N529 Male erectile dysfunction, unspecified: Secondary | ICD-10-CM

## 2015-06-01 DIAGNOSIS — Z23 Encounter for immunization: Secondary | ICD-10-CM | POA: Diagnosis not present

## 2015-06-01 DIAGNOSIS — Z79899 Other long term (current) drug therapy: Secondary | ICD-10-CM

## 2015-06-01 MED ORDER — SILDENAFIL CITRATE 50 MG PO TABS: 50.0000 mg | ORAL_TABLET | ORAL | Status: DC | PRN

## 2015-06-01 MED ORDER — CLOPIDOGREL BISULFATE 75 MG PO TABS: 75.0000 mg | ORAL_TABLET | Freq: Every day | ORAL | Status: DC

## 2015-06-01 MED ORDER — CARVEDILOL 25 MG PO TABS: 25.0000 mg | ORAL_TABLET | Freq: Two times a day (BID) | ORAL | Status: DC

## 2015-06-01 MED ORDER — ATORVASTATIN CALCIUM 20 MG PO TABS: 20.0000 mg | ORAL_TABLET | Freq: Every day | ORAL | Status: DC

## 2015-06-01 MED ORDER — SPIRONOLACTONE 25 MG PO TABS: 25.0000 mg | ORAL_TABLET | Freq: Every day | ORAL | Status: DC

## 2015-06-01 MED ORDER — CARVEDILOL 25 MG PO TABS
25.0000 mg | ORAL_TABLET | Freq: Two times a day (BID) | ORAL | Status: DC
Start: 2015-06-01 — End: 2015-06-01

## 2015-06-01 MED FILL — SPIRONOLACTONE 25 MG TABLET: 25 | 30 days supply | Qty: 30 | Fill #0

## 2015-06-01 MED FILL — CARVEDILOL 25 MG TABLET: 25 | 30 days supply | Qty: 60 | Fill #0

## 2015-06-01 NOTE — Progress Notes (Signed)
Subjective:    Patient ID: [name] Phillips, male    DOB: 14-Feb-1956, 60 y.o.   MRN: [id number]  HPI [name] Phillips is a 60 year old man with history of CVA, hypertension, chronic systolic CHF (LV EF 25-30% on echo 10/28/2013), nonischemic cardiomyopathy status post AICD in 2013, seizure, alcohol abuse presenting for evaluation of nosebleeds.  He has been having nosebleeds intermittently for the past 1-2 days. Bleeding stops spontaneously. It is a slow ooze. He says this happens when his blood pressure is high. He has been out of his blood pressure meds for at least a month. Recently was able to fill ramipril.  Review of Systems Constitutional: no fevers/chills Respiratory: no shortness of breath Cardiovascular: no chest pain Integument: no rash Hematologic/lymphatic: +epistaxis, no edema Neurological: no paresthesias, no weakness  Past Medical History  Diagnosis Date  . CVA (cerebral vascular accident) (HCC)   . Hypertension   . CHF (congestive heart failure) (HCC)   . Seizure (HCC)   . Nonischemic cardiomyopathy (HCC)     a. Catheterization 2005 normal coronary arteries; EF 15-20%; b. 2011 EF normal;  c. 2015 Echo: EF 25-30%;  d. s/p prior AICD with upgrade to MDT single lead ICD ser # [id number] h 2/2 ERI &12/19/2014).  . Alcohol abuse   . AICD (automatic cardioverter/defibrillator) present 08/08/2011  . Arthritis     Current Outpatient Prescriptions on File Prior to Visit  Medication Sig Dispense Refill  . aspirin 325 MG tablet Take 325 mg by mouth daily.    Marland Kitchen atorvastatin (LIPITOR) 20 MG tablet Take 20 mg by mouth daily.  0  . carvedilol (COREG) 25 MG tablet Take 1 tablet (25 mg total) by mouth 2 (two) times daily with a meal. 60 tablet 0  . clopidogrel (PLAVIX) 75 MG tablet Take 1 tablet (75 mg total) by mouth daily with breakfast. 30 tablet 0  . ramipril (ALTACE) 10 MG capsule Take 10 mg by mouth 2 (two) times daily.     . sildenafil (VIAGRA) 50 MG tablet Take 1 tablet (50 mg  total) by mouth as needed for erectile dysfunction. 20 tablet 1  . spironolactone (ALDACTONE) 25 MG tablet Take 1 tablet (25 mg total) by mouth daily. 30 tablet 1   No current facility-administered medications on file prior to visit.    Today's Vitals   06/01/15 0953 06/01/15 1049  BP: 167/112 155/119  Pulse: 83 98  Temp: 97.5 F (36.4 C)   TempSrc: Oral   Height: 6\' 3"  (1.905 m)   Weight: 170 lb 11.2 oz (77.429 kg)   SpO2: 99%    Objective:   Physical Exam  Constitutional: He is oriented to person, place, and time. He appears well-developed and well-nourished. No distress.  HENT:  Some blood in bilateral nares, no active bleeding visualized  Eyes: Conjunctivae are normal.  Neck: Neck supple.  Pulmonary/Chest: Effort normal and breath sounds normal. He has no wheezes. He has no rales.  Musculoskeletal: He exhibits no edema.  Neurological: He is alert and oriented to person, place, and time. Coordination normal.  Skin: Skin is warm and dry. He is not diaphoretic.  Psychiatric: He has a normal mood and affect.   Assessment & Plan:  Please refer to problem based charting.

## 2015-06-01 NOTE — ED Notes (Signed)
Called pt for room placement, no answer.

## 2015-06-01 NOTE — Assessment & Plan Note (Signed)
BP Readings from Last 3 Encounters:  06/01/15 155/119  05/31/15 144/100  05/01/15 163/109    Lab Results  Component Value Date   NA 140 12/19/2014   K 4.2 12/19/2014   CREATININE 1.33* 12/19/2014    Assessment: Blood pressure control: moderately elevated Progress toward BP goal:  unchanged Comments: Having epistaxis due to elevated blood pressure.  Plan: Medications:  Continue carvedilol 25 mg twice a day, ramipril 10 mg twice a day, spironolactone 25 mg daily. Refill sent for carvedilol and spironolactone. Other plans:  -Discussed with Dr. Selena Batten, pharmacy, regarding financial assistance in obtaining medications sooner

## 2015-06-01 NOTE — Assessment & Plan Note (Signed)
Assessment: likely related to his hypertension. No active bleeding upon evaluation today.   Plan: - Restart blood pressure medications - Instructed on conservative measures to stop bleeding - Provided with bacitracin for prevention of further bleeding

## 2015-06-01 NOTE — Patient Instructions (Addendum)
Nosebleed HOME CARE   Try controlling your nosebleed by pinching your nostrils gently. Do this for at least 10 minutes.  Avoid blowing or sniffing your nose for a number of hours after having a nosebleed.  Do not put gauze inside of your nose yourself.   Avoid lying down while you are having a nosebleed. Sit up and lean forward.  Keep your house humid by using:  Less air conditioning.  A humidifier.  Aspirin and blood thinners make bleeding more likely. If you are prescribed these medicines and you have nosebleeds, ask your doctor if you should stop taking the medicines or adjust the dose. Do not stop medicines unless told by your doctor.  Resume your normal activities as you are able. Avoid straining, lifting, or bending at your waist for several days.  If your nosebleed was caused by dryness in your nose, use over-the-counter saline nasal spray or gel. If you must use a lubricant:  Choose one that is water-soluble.  Use it only as needed.  Do not use it within several hours of lying down.  Keep all follow-up visits as told by your doctor. This is important. GET HELP IF:  You have a fever.  You get frequent nosebleeds.  You are getting nosebleeds more often. GET HELP RIGHT AWAY IF:  Your nosebleed lasts longer than 20 minutes.  You have unusual bleeding from other parts of your body.  You have unusual bruising on other parts of your body.  You feel light-headed or dizzy.  You become sweaty.  You throw up (vomit) blood.  You have a nosebleed after a head injury.   This information is not intended to replace advice given to you by your health care provider. Make sure you discuss any questions you have with your health care provider.   Document Released: 02/20/2008 Document Revised: 06/03/2014 Document Reviewed: 12/27/2013 Elsevier Interactive Patient Education [employer]

## 2015-06-01 NOTE — Addendum Note (Signed)
Addended by: Mliss Fritz on: 06/01/2015 12:53 PM   Modules accepted: Orders

## 2015-06-01 NOTE — Progress Notes (Signed)
Worked with [facility] and [facility] for medication procurement.

## 2015-06-01 NOTE — Assessment & Plan Note (Signed)
Refill re-sent  

## 2015-06-02 ENCOUNTER — Encounter: Payer: Self-pay | Admitting: Pharmacist

## 2015-06-02 ENCOUNTER — Ambulatory Visit (INDEPENDENT_AMBULATORY_CARE_PROVIDER_SITE_OTHER): Payer: Medicare Other | Admitting: Pharmacist

## 2015-06-02 DIAGNOSIS — Z7189 Other specified counseling: Secondary | ICD-10-CM | POA: Diagnosis not present

## 2015-06-02 DIAGNOSIS — Z719 Counseling, unspecified: Secondary | ICD-10-CM

## 2015-06-02 DIAGNOSIS — Z79899 Other long term (current) drug therapy: Secondary | ICD-10-CM | POA: Diagnosis not present

## 2015-06-02 NOTE — Patient Instructions (Signed)
Patient educated about medication as defined in this encounter and verbalized understanding by repeating back instructions provided.   

## 2015-06-02 NOTE — Progress Notes (Signed)
Internal Medicine Clinic Attending  Case discussed with Dr. Krall soon after the resident saw the patient.  We reviewed the resident's history and exam and pertinent patient test results.  I agree with the assessment, diagnosis, and plan of care documented in the resident's note. 

## 2015-06-02 NOTE — Progress Notes (Signed)
Medications were reviewed with the patient, including name, instructions, indication, goals of therapy, potential side effects, importance of adherence, and safe use.  Patient verbalized understanding by repeating back information and was advised to contact me if further medication-related questions arise. Patient was also provided an information handout.  

## 2015-06-05 ENCOUNTER — Other Ambulatory Visit: Payer: Self-pay

## 2015-06-22 ENCOUNTER — Telehealth: Payer: Self-pay | Admitting: Pulmonary Disease

## 2015-06-22 NOTE — Telephone Encounter (Signed)
Call to patient to confirm appointment for 06/23/15 at  9:15 lmtcb ° °

## 2015-06-23 ENCOUNTER — Ambulatory Visit: Payer: Medicare Other | Admitting: Pulmonary Disease

## 2015-06-29 ENCOUNTER — Telehealth: Payer: Self-pay | Admitting: Internal Medicine

## 2015-06-29 NOTE — Telephone Encounter (Signed)
Call to patient to confirm appointment for 06/30/15 at 10:15 lmtcb

## 2015-06-30 ENCOUNTER — Ambulatory Visit: Payer: Medicare Other | Admitting: Internal Medicine

## 2015-06-30 ENCOUNTER — Encounter: Payer: Self-pay | Admitting: Internal Medicine

## 2015-07-31 ENCOUNTER — Telehealth: Payer: Self-pay | Admitting: Internal Medicine

## 2015-07-31 NOTE — Telephone Encounter (Signed)
APPT. REMINDER CALL, LMTCB IF HE NEEDS TO CANCEL °

## 2015-08-01 ENCOUNTER — Ambulatory Visit: Payer: Medicare Other | Admitting: Internal Medicine

## 2015-08-01 ENCOUNTER — Encounter: Payer: Self-pay | Admitting: Internal Medicine

## 2015-08-09 ENCOUNTER — Ambulatory Visit (INDEPENDENT_AMBULATORY_CARE_PROVIDER_SITE_OTHER): Payer: Medicare Other | Admitting: Internal Medicine

## 2015-08-09 ENCOUNTER — Encounter: Payer: Self-pay | Admitting: Licensed Clinical Social Worker

## 2015-08-09 ENCOUNTER — Encounter: Payer: Self-pay | Admitting: Internal Medicine

## 2015-08-09 VITALS — BP 111/82 | HR 73 | Temp 98.1°F | Ht 75.0 in | Wt 174.0 lb

## 2015-08-09 DIAGNOSIS — Z7982 Long term (current) use of aspirin: Secondary | ICD-10-CM

## 2015-08-09 DIAGNOSIS — F1010 Alcohol abuse, uncomplicated: Secondary | ICD-10-CM

## 2015-08-09 DIAGNOSIS — Z79899 Other long term (current) drug therapy: Secondary | ICD-10-CM

## 2015-08-09 DIAGNOSIS — I10 Essential (primary) hypertension: Secondary | ICD-10-CM | POA: Diagnosis not present

## 2015-08-09 DIAGNOSIS — I429 Cardiomyopathy, unspecified: Secondary | ICD-10-CM | POA: Diagnosis not present

## 2015-08-09 DIAGNOSIS — F1020 Alcohol dependence, uncomplicated: Secondary | ICD-10-CM

## 2015-08-09 DIAGNOSIS — Z8673 Personal history of transient ischemic attack (TIA), and cerebral infarction without residual deficits: Secondary | ICD-10-CM

## 2015-08-09 DIAGNOSIS — Z0000 Encounter for general adult medical examination without abnormal findings: Secondary | ICD-10-CM | POA: Insufficient documentation

## 2015-08-09 DIAGNOSIS — I428 Other cardiomyopathies: Secondary | ICD-10-CM

## 2015-08-09 MED ORDER — RAMIPRIL 10 MG PO CAPS: 10.0000 mg | ORAL_CAPSULE | Freq: Two times a day (BID) | ORAL | Status: DC

## 2015-08-09 MED ORDER — CLOPIDOGREL BISULFATE 75 MG PO TABS: 75.0000 mg | ORAL_TABLET | Freq: Every day | ORAL | Status: DC

## 2015-08-09 MED ORDER — SPIRONOLACTONE 25 MG PO TABS: 25.0000 mg | ORAL_TABLET | Freq: Every day | ORAL | Status: DC

## 2015-08-09 MED ORDER — ASPIRIN EC 81 MG PO TBEC: 81.0000 mg | DELAYED_RELEASE_TABLET | Freq: Every day | ORAL | Status: AC

## 2015-08-09 MED ORDER — CARVEDILOL 25 MG PO TABS: 25.0000 mg | ORAL_TABLET | Freq: Two times a day (BID) | ORAL | Status: DC

## 2015-08-09 MED ORDER — ATORVASTATIN CALCIUM 20 MG PO TABS: 20.0000 mg | ORAL_TABLET | Freq: Every day | ORAL | Status: DC

## 2015-08-09 NOTE — Assessment & Plan Note (Signed)
Still drinks alcohol, says he has cut down, last drink was 2 days ago- beer and gin. Pt says he is not ready to quit drinking alcohol.

## 2015-08-09 NOTE — Assessment & Plan Note (Signed)
BP Readings from Last 3 Encounters:  08/09/15 111/82  06/01/15 155/119  05/31/15 144/100    Lab Results  Component Value Date   NA 140 12/19/2014   K 4.2 12/19/2014   CREATININE 1.33* 12/19/2014    Assessment: Blood pressure control:   Controlled Comments: complaint with coreg, ramipril and spironolactone.   Plan: Other plans: refills given - Bmet today, follow K, as he is on ramipril and spironolactone.

## 2015-08-09 NOTE — Progress Notes (Signed)
Patient ID: [name] Phillips, male   DOB: 05-May-1956, 60 y.o.   MRN: [id number]   Subjective:   Patient ID: [name] Phillips male   DOB: May 29, 1955 60 y.o.   MRN: [id number]  HPI: [name] is a 60 y.o. with PMH listed below, presented today to request refills on meds, and to follow up on Cardiomyopathy, HTN.  Past Medical History  Diagnosis Date  . CVA (cerebral vascular accident) (HCC)   . Hypertension   . CHF (congestive heart failure) (HCC)   . Seizure (HCC)   . Nonischemic cardiomyopathy (HCC)     a. Catheterization 2005 normal coronary arteries; EF 15-20%; b. 2011 EF normal;  c. 2015 Echo: EF 25-30%;  d. s/p prior AICD with upgrade to MDT single lead ICD ser # [id number] h 2/2 ERI &12/19/2014).  . Alcohol abuse   . AICD (automatic cardioverter/defibrillator) present 08/08/2011  . Arthritis    Review of Systems: CONSTITUTIONAL- No Fever, no change in appetite. SKIN- No Rash, colour changes or itching. HEAD- No Headache or dizziness. Mouth/throat-wears dentures, no bleeding gums. RESPIRATORY- No Cough or SOB. CARDIAC- No Palpitations, or chest pain. GI- No  vomiting, diarrhoea, abd pain. URINARY- No Frequency, or dysuria.  Objective:  Physical Exam: Filed Vitals:   08/09/15 0821  BP: 111/82  Pulse: 73  Temp: 98.1 F (36.7 C)  TempSrc: Oral  Height: 6\' 3"  (1.905 m)  Weight: 174 lb (78.926 kg)  SpO2: 97%   GENERAL- alert, co-operative, not in any distress. HEENT- Atraumatic, normocephalic, PERRL, moist oral mucosa, no erythema, wears dentures. CARDIAC- RRR, no murmurs, rubs or gallops. RESP- Moving equal volumes of air, and no wheezes or crackles. ABDOMEN- Soft, nontender NEURO- Alert, Gait- Normal. EXTREMITIES- warm, no pedal edema. SKIN- Warm, dry, No rash or lesion. PSYCH- Normal mood and affect, appropriate thought content and speech.  Assessment & Plan:  The patient's case and plan of care was discussed with attending physician, Dr. Heide Spark.  Please see  problem based charting for assessment and plan.

## 2015-08-09 NOTE — Progress Notes (Signed)
Internal Medicine Clinic Attending  Case discussed with Dr. Emokpae soon after the resident saw the patient.  We reviewed the resident's history and exam and pertinent patient test results.  I agree with the assessment, diagnosis, and plan of care documented in the resident's note. 

## 2015-08-09 NOTE — Assessment & Plan Note (Signed)
Colonoscopy next visit.

## 2015-08-09 NOTE — Assessment & Plan Note (Addendum)
Patient is presently on ramipril an spironolactone. Echo- 15-20% 2005, EF- 2015- 25-30%. He has no sign of volume overload. He follows with Dr Marni Griffon. He is on coreg 25 BID, Spironolactone, and Ramipril- 10mg  BID. ICD in place, has fired intermittently, he cannot remember the last time  Plan- refilled Coreg, Spironolactone and Ramipril. - Though he has NICM, he is on aspirin- now 81mg  daily and plavix. Not sure why he is on both, as he has not had stents placed and he has a hx of stroke ?when but from the notes he has been on plavix since at least 2012. - 1 month refill for plavix for now, follow up with cards

## 2015-08-09 NOTE — Progress Notes (Signed)
Mr. Vandegrift was referred to CSW for transportation assistance.  [place] from today's [facility] appointment.  CSW met with Mr. Mroczkowski following appointment.  CSW provided pt with [facility] [place] reduced fare bus pass and information on the [employer]  CSW encouraged Mr. Meester to contact [employer] for medical transportation in the future as CSW can not guarantee [facility] will be able to continue to provide bus passes.  Pt voiced understanding and denies add'l social work needs at this time.

## 2015-08-10 LAB — BMP8+ANION GAP
Anion Gap: 16 mmol/L (ref 10.0–18.0)
BUN/Creatinine Ratio: 15 (ref 10–22)
BUN: 20 mg/dL (ref 8–27)
CO2: 23 mmol/L (ref 18–29)
Calcium: 9.1 mg/dL (ref 8.6–10.2)
Chloride: 96 mmol/L (ref 96–106)
Creatinine, Ser: 1.35 mg/dL — ABNORMAL HIGH (ref 0.76–1.27)
GFR calc Af Amer: 65 mL/min/{1.73_m2} (ref >59)
GFR calc non Af Amer: 56 mL/min/{1.73_m2} — ABNORMAL LOW (ref >59)
Glucose: 104 mg/dL — ABNORMAL HIGH (ref 65–99)
Potassium: 4.4 mmol/L (ref 3.5–5.2)
Sodium: 135 mmol/L (ref 134–144)

## 2015-08-30 ENCOUNTER — Other Ambulatory Visit: Payer: Self-pay | Admitting: Pulmonary Disease

## 2015-09-05 ENCOUNTER — Other Ambulatory Visit: Payer: Self-pay

## 2015-09-05 ENCOUNTER — Telehealth: Payer: Self-pay

## 2015-09-05 DIAGNOSIS — I428 Other cardiomyopathies: Secondary | ICD-10-CM

## 2015-09-05 DIAGNOSIS — I10 Essential (primary) hypertension: Secondary | ICD-10-CM

## 2015-09-05 NOTE — Telephone Encounter (Signed)
rec'd call from heart failure RN with [facility] requesting new rx be sent to [facility].  She states through her case management efforts she identified that patient was unable to afford his medicine and had about 4 days worth remaining with no way to pick up refills.  She worked it out with [facility] and they can mail patients medication with no cost to patient if we send new scripts

## 2015-09-06 MED ORDER — ATORVASTATIN CALCIUM 20 MG PO TABS: 20.0000 mg | ORAL_TABLET | Freq: Every day | ORAL | Status: DC

## 2015-09-06 MED ORDER — CARVEDILOL 25 MG PO TABS: 25.0000 mg | ORAL_TABLET | Freq: Two times a day (BID) | ORAL | Status: DC

## 2015-09-06 MED ORDER — RAMIPRIL 10 MG PO CAPS: 10.0000 mg | ORAL_CAPSULE | Freq: Two times a day (BID) | ORAL | Status: DC

## 2015-09-06 MED ORDER — CLOPIDOGREL BISULFATE 75 MG PO TABS: 75.0000 mg | ORAL_TABLET | Freq: Every day | ORAL | Status: DC

## 2015-09-06 MED ORDER — SPIRONOLACTONE 25 MG PO TABS
25.0000 mg | ORAL_TABLET | Freq: Every day | ORAL | Status: DC
Start: 2015-09-06 — End: 2015-12-20

## 2015-09-08 ENCOUNTER — Telehealth: Payer: Self-pay | Admitting: Internal Medicine

## 2015-09-08 NOTE — Telephone Encounter (Signed)
LMOVM for [name] Phillips.  Patient is overdue for follow-up with Dr. Graciela Husbands.  Will discuss with patient or with [name] Phillips when she returns call.

## 2015-09-08 NOTE — Telephone Encounter (Signed)
Per [facility]-pt lost his transmiter box, he is now homeless-nurse asking what we can do about ordering another? I suggested maybe send it here and pt can pick it up? If Any questions please call nurse Jodie [phone]

## 2015-09-13 NOTE — Telephone Encounter (Signed)
LMOVM for Jodie requesting call back.  Gave device clinic phone number for return call.

## 2015-09-13 NOTE — Telephone Encounter (Signed)
Jodie returned call. She states that she is trying to assist patient in becoming compliant.  Advised that I cannot order a new [employer] monitor for the patient, but that he can call and order one.  Also advised that patient can be seen for in-office checks until he is in a more stable living situation.  Advised that patient is overdue to see Dr. Graciela Husbands.  Jodie states that we should try to schedule an appointment for him and she will remind him of his appointment as it gets closer.  She gave an updated phone number for the patient-- [phone].  Will call to advise that we have scheduled him for an appointment.  Patient made aware of appointment on 10/11/15 at 9:00am and he is agreeable.  He denies additional questions or concerns at this time.

## 2015-10-11 ENCOUNTER — Encounter: Payer: Medicare Other | Admitting: Internal Medicine

## 2015-10-11 NOTE — Progress Notes (Signed)
error 

## 2015-10-12 ENCOUNTER — Encounter: Payer: Self-pay | Admitting: Internal Medicine

## 2015-10-16 ENCOUNTER — Ambulatory Visit: Payer: Medicare Other | Admitting: Internal Medicine

## 2015-10-16 ENCOUNTER — Encounter: Payer: Self-pay | Admitting: Internal Medicine

## 2015-10-25 ENCOUNTER — Other Ambulatory Visit: Payer: Self-pay | Admitting: Internal Medicine

## 2015-10-25 NOTE — Telephone Encounter (Signed)
Last appt 3/15; next appt 6/30.

## 2015-10-29 ENCOUNTER — Encounter (HOSPITAL_COMMUNITY): Payer: Self-pay | Admitting: Emergency Medicine

## 2015-10-29 ENCOUNTER — Emergency Department (HOSPITAL_COMMUNITY)
Admission: EM | Admit: 2015-10-29 | Discharge: 2015-10-29 | Disposition: A | Discharge status: Home / Self Care | Payer: Medicare Other | Attending: Emergency Medicine | Admitting: Emergency Medicine

## 2015-10-29 ENCOUNTER — Emergency Department (HOSPITAL_COMMUNITY): Payer: Medicare Other

## 2015-10-29 DIAGNOSIS — Z8673 Personal history of transient ischemic attack (TIA), and cerebral infarction without residual deficits: Secondary | ICD-10-CM | POA: Insufficient documentation

## 2015-10-29 DIAGNOSIS — I110 Hypertensive heart disease with heart failure: Secondary | ICD-10-CM | POA: Diagnosis not present

## 2015-10-29 DIAGNOSIS — M549 Dorsalgia, unspecified: Secondary | ICD-10-CM | POA: Diagnosis not present

## 2015-10-29 DIAGNOSIS — Z7982 Long term (current) use of aspirin: Secondary | ICD-10-CM | POA: Diagnosis not present

## 2015-10-29 DIAGNOSIS — F17210 Nicotine dependence, cigarettes, uncomplicated: Secondary | ICD-10-CM | POA: Diagnosis not present

## 2015-10-29 DIAGNOSIS — Z23 Encounter for immunization: Secondary | ICD-10-CM | POA: Insufficient documentation

## 2015-10-29 DIAGNOSIS — I509 Heart failure, unspecified: Secondary | ICD-10-CM | POA: Insufficient documentation

## 2015-10-29 DIAGNOSIS — W19XXXA Unspecified fall, initial encounter: Secondary | ICD-10-CM

## 2015-10-29 DIAGNOSIS — S0081XA Abrasion of other part of head, initial encounter: Secondary | ICD-10-CM | POA: Insufficient documentation

## 2015-10-29 DIAGNOSIS — W0110XA Fall on same level from slipping, tripping and stumbling with subsequent striking against unspecified object, initial encounter: Secondary | ICD-10-CM | POA: Diagnosis not present

## 2015-10-29 DIAGNOSIS — Z95810 Presence of automatic (implantable) cardiac defibrillator: Secondary | ICD-10-CM | POA: Insufficient documentation

## 2015-10-29 DIAGNOSIS — Y9289 Other specified places as the place of occurrence of the external cause: Secondary | ICD-10-CM | POA: Diagnosis not present

## 2015-10-29 DIAGNOSIS — Y939 Activity, unspecified: Secondary | ICD-10-CM | POA: Insufficient documentation

## 2015-10-29 DIAGNOSIS — Y999 Unspecified external cause status: Secondary | ICD-10-CM | POA: Diagnosis not present

## 2015-10-29 DIAGNOSIS — Z7901 Long term (current) use of anticoagulants: Secondary | ICD-10-CM | POA: Insufficient documentation

## 2015-10-29 DIAGNOSIS — S0990XA Unspecified injury of head, initial encounter: Secondary | ICD-10-CM | POA: Diagnosis present

## 2015-10-29 MED ORDER — TETANUS-DIPHTH-ACELL PERTUSSIS 5-2.5-18.5 LF-MCG/0.5 IM SUSP
0.5000 mL | Freq: Once | INTRAMUSCULAR | Status: AC
  Administered 2015-10-29: 0.5 mL via INTRAMUSCULAR
  Filled 2015-10-29: qty 0.5

## 2015-10-29 NOTE — ED Provider Notes (Signed)
CSN: [id number]     Arrival date & time 10/29/15  1717 History   First MD Initiated Contact with Patient 10/29/15 1729     Chief Complaint  Patient presents with  . Fall  . Back Pain  . Abrasion     (Consider location/radiation/quality/duration/timing/severity/associated sxs/prior Treatment) Patient is a 60 y.o. male presenting with fall and back pain. The history is provided by the patient and medical records.  Fall Associated symptoms include chest pain (left ribs) and neck pain.  Back Pain Associated symptoms: chest pain (left ribs)     60 year old male with history of hypertension, stroke, seizure disorder, cardiomyopathy, presenting to the ED after a fall. Family member at bedside, drove him here for evaluation.  Patient reports he has been drinking "liquor drinks" all day. He states this is not routine for him, however he just felt like drinking today because it was the weekend.  States around noon he lost his footing and fell onto his left side on the porch.  States he fell off the bottom step onto his gravel driveway.  He did strike the right side of his head during the fall, no LOC.  Denies dizziness or feelings of syncope prior to fall.  He states he was able to get up independently and has been doing well the remainder of the afternoon. He states now he has some pain along the right side of his forehead at site of injury, right side of his neck, and left ribs. He denies any headache, dizziness, confusion, numbness, or weakness. He remains ambulatory without assistance. Patient is currently on aspirin and Plavix for stroke prevention.  Patient estimates he has had approximately 1 pint of vodka so far today.  Unsure of date of last tetanus vaccine.  Past Medical History  Diagnosis Date  . CVA (cerebral vascular accident) (HCC)   . Hypertension   . CHF (congestive heart failure) (HCC)   . Seizure (HCC)   . Nonischemic cardiomyopathy (HCC)     a. Catheterization 2005 normal coronary  arteries; EF 15-20%; b. 2011 EF normal;  c. 2015 Echo: EF 25-30%;  d. s/p prior AICD with upgrade to MDT single lead ICD ser # [id number] h 2/2 ERI &12/19/2014).  . Alcohol abuse   . AICD (automatic cardioverter/defibrillator) present 08/08/2011  . Arthritis    Past Surgical History  Procedure Laterality Date  . Cardiac defibrillator placement  2007    MDT [employer] single chamber ICD implanted with 6949 lead by Dr Ty Hilts for primary prevention  . Icd generator change  12/19/2014  . Ep implantable device N/A 12/19/2014    Procedure:  ICD Generator Changeout;  Surgeon: Duke Salvia, MD;  Location: [facility] INVASIVE CV LAB;  Service: Cardiovascular;  Laterality: N/A;  . Ep implantable device N/A 12/19/2014    Procedure: Lead Revision;  Surgeon: Duke Salvia, MD;  Location: [facility] INVASIVE CV LAB;  Service: Cardiovascular;  Laterality: N/A;   Family History  Problem Relation Age of Onset  . Heart disease Father   . Hypertension    . Diabetes type II Sister   . Seizures Brother   . Lupus Sister    Social History  Substance Use Topics  . Smoking status: Current Every Day Smoker -- 0.50 packs/day for 39 years    Types: Cigarettes  . Smokeless tobacco: Former Neurosurgeon    Types: Snuff, Chew     Comment: Or less.  . Alcohol Use: 1.8 oz/week    3 Cans of beer per week  Comment: 2x/month    Review of Systems  Cardiovascular: Positive for chest pain (left ribs).  Musculoskeletal: Positive for neck pain.  Skin: Positive for wound.  All other systems reviewed and are negative.     Allergies  Codeine; Penicillins; and Prednisone  Home Medications   Prior to Admission medications   Medication Sig Start Date End Date Taking? Authorizing Provider  aspirin EC 81 MG tablet Take 1 tablet (81 mg total) by mouth daily. 08/09/15 08/08/16  Ejiroghene Wendall Stade, MD  atorvastatin (LIPITOR) 20 MG tablet Take 1 tablet (20 mg total) by mouth daily. 09/06/15   Fuller Plan, MD  carvedilol (COREG) 25 MG  tablet Take 1 tablet (25 mg total) by mouth 2 (two) times daily with a meal. Medicare D; BIN# [id number]; PCN# [id number]; GRP# [id number]; ID# [id number] 09/06/15   Fuller Plan, MD  clopidogrel (PLAVIX) 75 MG tablet Take 1 tablet (75 mg total) by mouth daily with breakfast. 09/06/15   Fuller Plan, MD  ramipril (ALTACE) 10 MG capsule Take 1 capsule by mouth  twice a day 10/26/15   Fuller Plan, MD  sildenafil (VIAGRA) 50 MG tablet Take 1 tablet (50 mg total) by mouth as needed for erectile dysfunction. 06/01/15 05/31/16  Lora Paula, MD  spironolactone (ALDACTONE) 25 MG tablet Take 1 tablet (25 mg total) by mouth daily. 09/06/15   Fuller Plan, MD   BP 139/94 mmHg  Pulse 94  Temp(Src) 98 F (36.7 C)  Resp 16  SpO2 94%   Physical Exam  Constitutional: He is oriented to person, place, and time. He appears well-developed and well-nourished. No distress.  HENT:  Head: Normocephalic and atraumatic.  Mouth/Throat: Oropharynx is clear and moist.  2 moderate-sized abrasions noted to right forehead with associated soft hematoma, no bleeding, no skull depression or deformity noted  Eyes: Conjunctivae and EOM are normal. Pupils are equal, round, and reactive to light.  Pupils symmetric and reactive bilaterally  Neck: Normal range of motion. Neck supple.  Cardiovascular: Normal rate, regular rhythm and normal heart sounds.   Pulmonary/Chest: Effort normal and breath sounds normal. No respiratory distress. He has no wheezes.  Abdominal: Soft. Bowel sounds are normal. There is no tenderness. There is no guarding.  Musculoskeletal: Normal range of motion.  No bruising, abrasions, or lacerations noted to back No midline C/T/L spine tenderness Some mild tenderness of right cervical paraspinal region  Neurological: He is alert and oriented to person, place, and time.  AAOx3, answering questions and following commands appropriately; equal strength UE and LE bilaterally; CN grossly intact; moves all  extremities appropriately without ataxia; no focal neuro deficits or facial asymmetry appreciated  Skin: Skin is warm and dry. He is not diaphoretic.  Psychiatric: He has a normal mood and affect.  Nursing note and vitals reviewed.   ED Course  Procedures (including critical care time) Labs Review Labs Reviewed - No data to display  Imaging Review Dg Ribs Unilateral W/chest Left  10/29/2015  CLINICAL DATA:  Acute left chest and rib pain following fall today. Initial encounter. EXAM: LEFT RIBS AND CHEST - 3+ VIEW COMPARISON:  12/20/2014 and prior exams FINDINGS: The cardiomediastinal silhouette is unremarkable. A right-sided pacemaker/ICD again noted. No rib abnormalities are identified. There is no evidence of focal airspace disease, pulmonary edema, suspicious pulmonary nodule/mass, pleural effusion, or pneumothorax. No acute bony abnormalities are identified. IMPRESSION: Negative. Electronically Signed   By: Harmon Pier M.D.   On: 10/29/2015 18:34  Ct Head Wo Contrast  10/29/2015  CLINICAL DATA:  Pain following fall earlier today EXAM: CT HEAD WITHOUT CONTRAST CT CERVICAL SPINE WITHOUT CONTRAST TECHNIQUE: Multidetector CT imaging of the head and cervical spine was performed following the standard protocol without intravenous contrast. Multiplanar CT image reconstructions of the cervical spine were also generated. COMPARISON:  CT head and CT cervical spine January 02, 2014 FINDINGS: CT HEAD FINDINGS Mild diffuse atrophy is stable. There is no intracranial mass, hemorrhage, extra-axial fluid collection, or midline shift. There is evidence of a prior infarct in the mid left occipital lobe. There is a prior lacunar infarct in the superior head of the caudate nucleus the right. There is a prior lacunar type infarct just superior to the left caudate nucleus head in the anterior inferior left centrum semiovale. There is small vessel disease throughout the centra semiovale bilaterally, stable. There is no new  gray-white compartment lesion. No acute infarct evident. Bony calvarium appears intact. The mastoid air cells are clear. No intraorbital lesions are evident. CT CERVICAL SPINE FINDINGS There is no fracture or spondylolisthesis. Prevertebral soft tissues and predental space regions are normal. There is moderately severe disc space narrowing at C6-7 and C7-T1. There are large bridging anterior osteophytes throughout the cervical spine, most pronounced at C3-4, C4-5, C5-6, and C6-7. These changes are stable. There is facet hypertrophy at multiple levels. There is exit foraminal narrowing due to bony hypertrophy at C4-5 on the left, C5-6 on the right, and C6-7 bilaterally. There are foci of carotid artery calcification bilaterally. IMPRESSION: CT head: Stable atrophy with periventricular small vessel disease. Prior infarcts, largest in the mid left occipital lobe. No acute infarct evident. No hemorrhage or mass effect. No extra-axial fluid collection. CT cervical spine: No fracture or spondylolisthesis. Multilevel arthropathy is noted with diffuse idiopathic skeletal hyperostosis. Appearance of the large anterior osteophytes at multiple levels is stable. There are foci of carotid artery calcification bilaterally. Electronically Signed   By: Bretta Bang III M.D.   On: 10/29/2015 19:00   Ct Cervical Spine Wo Contrast  10/29/2015  CLINICAL DATA:  Pain following fall earlier today EXAM: CT HEAD WITHOUT CONTRAST CT CERVICAL SPINE WITHOUT CONTRAST TECHNIQUE: Multidetector CT imaging of the head and cervical spine was performed following the standard protocol without intravenous contrast. Multiplanar CT image reconstructions of the cervical spine were also generated. COMPARISON:  CT head and CT cervical spine January 02, 2014 FINDINGS: CT HEAD FINDINGS Mild diffuse atrophy is stable. There is no intracranial mass, hemorrhage, extra-axial fluid collection, or midline shift. There is evidence of a prior infarct in the mid  left occipital lobe. There is a prior lacunar infarct in the superior head of the caudate nucleus the right. There is a prior lacunar type infarct just superior to the left caudate nucleus head in the anterior inferior left centrum semiovale. There is small vessel disease throughout the centra semiovale bilaterally, stable. There is no new gray-white compartment lesion. No acute infarct evident. Bony calvarium appears intact. The mastoid air cells are clear. No intraorbital lesions are evident. CT CERVICAL SPINE FINDINGS There is no fracture or spondylolisthesis. Prevertebral soft tissues and predental space regions are normal. There is moderately severe disc space narrowing at C6-7 and C7-T1. There are large bridging anterior osteophytes throughout the cervical spine, most pronounced at C3-4, C4-5, C5-6, and C6-7. These changes are stable. There is facet hypertrophy at multiple levels. There is exit foraminal narrowing due to bony hypertrophy at C4-5 on the left, C5-6 on  the right, and C6-7 bilaterally. There are foci of carotid artery calcification bilaterally. IMPRESSION: CT head: Stable atrophy with periventricular small vessel disease. Prior infarcts, largest in the mid left occipital lobe. No acute infarct evident. No hemorrhage or mass effect. No extra-axial fluid collection. CT cervical spine: No fracture or spondylolisthesis. Multilevel arthropathy is noted with diffuse idiopathic skeletal hyperostosis. Appearance of the large anterior osteophytes at multiple levels is stable. There are foci of carotid artery calcification bilaterally. Electronically Signed   By: Bretta Bang III M.D.   On: 10/29/2015 19:00   I have personally reviewed and evaluated these images and lab results as part of my medical decision-making.   EKG Interpretation None      MDM   Final diagnoses:  Fall, initial encounter  Facial abrasion, initial encounter   60 year old male here with a fall that occurred around  noon today. It is unclear as to why he waited so long to come to the ED for evaluation. He has abrasion to his right forehead with a small soft hematoma associated. Complains of pain on the right side of his neck and left ribs.  No acute bony deformities noted on exam.  Patient is neurologically intact.  Lungs clear, no distress.  Plan for CT head/CS given he is on ASA and plavix, as well as left rib films.  7:45 PM Advised that patient and family members were leaving.  I was told they were in the waiting room, however i went to find them and they were not there.  Patient and family eloped prior to discussing imaging results (which were negative) or receiving discharge instructions.  Garlon Hatchet, PA-C 10/29/15 2007  Lorre Nick, MD 10/29/15 [phone]

## 2015-10-29 NOTE — ED Notes (Signed)
Pt and family member left without d/c papers. Pt stated he had been here long enough and was leaving. Advised patient to stay to receive instructions, but patient refused.

## 2015-10-29 NOTE — ED Notes (Signed)
Pt consumed one pint of vodka this morning, pt fell on porch at approximately noon, pt with abrasion R forehead, L flank pain, R posterior shoulder pain.

## 2015-10-29 NOTE — ED Notes (Signed)
Bed: [facility] Expected date:  Expected time:  Means of arrival:  Comments: 60 yo fall; ETOH

## 2015-10-29 NOTE — ED Notes (Signed)
Pt left without dc instructions.

## 2015-11-24 ENCOUNTER — Encounter: Payer: Medicare Other | Admitting: Internal Medicine

## 2015-11-24 ENCOUNTER — Encounter: Payer: Self-pay | Admitting: Internal Medicine

## 2015-12-04 ENCOUNTER — Other Ambulatory Visit: Payer: Self-pay | Admitting: *Deleted

## 2015-12-04 DIAGNOSIS — I428 Other cardiomyopathies: Secondary | ICD-10-CM

## 2015-12-04 DIAGNOSIS — I10 Essential (primary) hypertension: Secondary | ICD-10-CM

## 2015-12-04 NOTE — Telephone Encounter (Signed)
Received faxed refill request for clopidogrel (plavix) 75 mg). Pharmacy information below-of note, this is a different pharmacy than than the ones listed on pt's chart.  I attempted to contact patient to confirm pharmacy and to also let him know he needs an appt.  No answer-left message on recorder ([phone]) unable to leave message at other number [phone]).  Will still send refill request to pcp for review, please advise.Criss Alvine, Itai Barbian Cassady7/10/20172:49 PM  (prescription will have to be phoned in to pharmacy below)   [facility] [address]  [place], [place] [address] Fax# [phone] Ph# [phone]

## 2015-12-05 ENCOUNTER — Other Ambulatory Visit: Payer: Self-pay | Admitting: Internal Medicine

## 2015-12-05 DIAGNOSIS — I428 Other cardiomyopathies: Secondary | ICD-10-CM

## 2015-12-05 DIAGNOSIS — I10 Essential (primary) hypertension: Secondary | ICD-10-CM

## 2015-12-05 NOTE — Telephone Encounter (Signed)
Needs atorvastatin (LIPITOR) 20 MG tablet [facility]  [address], [address]  [place], [place] [address]  Fax# [phone]  Ph# [phone]

## 2015-12-06 MED ORDER — ATORVASTATIN CALCIUM 20 MG PO TABS: 20.0000 mg | ORAL_TABLET | Freq: Every day | ORAL | Status: DC

## 2015-12-06 MED ORDER — CLOPIDOGREL BISULFATE 75 MG PO TABS: 75.0000 mg | ORAL_TABLET | Freq: Every day | ORAL | Status: DC

## 2015-12-06 NOTE — Telephone Encounter (Signed)
LVM to return call.

## 2015-12-07 NOTE — Telephone Encounter (Signed)
rx phoned into pharmacy.Criss Alvine, Darlene Cassady7/13/201710:27 AM

## 2015-12-20 ENCOUNTER — Telehealth: Payer: Self-pay | Admitting: Pulmonary Disease

## 2015-12-20 DIAGNOSIS — I428 Other cardiomyopathies: Secondary | ICD-10-CM

## 2015-12-20 DIAGNOSIS — I10 Essential (primary) hypertension: Secondary | ICD-10-CM

## 2015-12-26 NOTE — Telephone Encounter (Signed)
Has appt scheduled with Dr. Dimple Casey on 01-05-16 @ 3:15 pm.

## 2016-01-05 ENCOUNTER — Encounter: Payer: Medicare Other | Admitting: Internal Medicine

## 2016-02-22 NOTE — Progress Notes (Signed)
This encounter was created in error - please disregard.

## 2016-03-13 ENCOUNTER — Emergency Department (HOSPITAL_COMMUNITY)
Admission: EM | Admit: 2016-03-13 | Discharge: 2016-03-14 | Disposition: A | Discharge status: Home / Self Care | Payer: Medicare Other | Attending: Emergency Medicine | Admitting: Emergency Medicine

## 2016-03-13 ENCOUNTER — Encounter (HOSPITAL_COMMUNITY): Payer: Self-pay | Admitting: Emergency Medicine

## 2016-03-13 ENCOUNTER — Emergency Department (HOSPITAL_COMMUNITY): Payer: Medicare Other

## 2016-03-13 DIAGNOSIS — Z7982 Long term (current) use of aspirin: Secondary | ICD-10-CM | POA: Insufficient documentation

## 2016-03-13 DIAGNOSIS — I110 Hypertensive heart disease with heart failure: Secondary | ICD-10-CM | POA: Insufficient documentation

## 2016-03-13 DIAGNOSIS — R251 Tremor, unspecified: Secondary | ICD-10-CM | POA: Diagnosis not present

## 2016-03-13 DIAGNOSIS — I428 Other cardiomyopathies: Secondary | ICD-10-CM

## 2016-03-13 DIAGNOSIS — I5022 Chronic systolic (congestive) heart failure: Secondary | ICD-10-CM | POA: Insufficient documentation

## 2016-03-13 DIAGNOSIS — Z8673 Personal history of transient ischemic attack (TIA), and cerebral infarction without residual deficits: Secondary | ICD-10-CM | POA: Insufficient documentation

## 2016-03-13 DIAGNOSIS — F17210 Nicotine dependence, cigarettes, uncomplicated: Secondary | ICD-10-CM | POA: Diagnosis not present

## 2016-03-13 DIAGNOSIS — I10 Essential (primary) hypertension: Secondary | ICD-10-CM | POA: Diagnosis present

## 2016-03-13 LAB — COMPREHENSIVE METABOLIC PANEL
ALT: 71 U/L — ABNORMAL HIGH (ref 17–63)
AST: 106 U/L — ABNORMAL HIGH (ref 15–41)
Albumin: 4 g/dL (ref 3.5–5.0)
Alkaline Phosphatase: 107 U/L (ref 38–126)
Anion gap: 17 — ABNORMAL HIGH (ref 5–15)
BUN: 12 mg/dL (ref 6–20)
CO2: 23 mmol/L (ref 22–32)
Calcium: 9.4 mg/dL (ref 8.9–10.3)
Chloride: 99 mmol/L — ABNORMAL LOW (ref 101–111)
Creatinine, Ser: 1.24 mg/dL (ref 0.61–1.24)
GFR calc Af Amer: >60 mL/min (ref >60)
GFR calc non Af Amer: >60 mL/min (ref >60)
Glucose, Bld: 90 mg/dL (ref 65–99)
Potassium: 4.4 mmol/L (ref 3.5–5.1)
Sodium: 139 mmol/L (ref 135–145)
Total Bilirubin: 1.2 mg/dL (ref 0.3–1.2)
Total Protein: 7.4 g/dL (ref 6.5–8.1)

## 2016-03-13 LAB — CBC WITH DIFFERENTIAL/PLATELET
Basophils Absolute: 0 10*3/uL (ref 0.0–0.1)
Basophils Relative: 1 %
Eosinophils Absolute: 0 10*3/uL (ref 0.0–0.7)
Eosinophils Relative: 1 %
HCT: 35.2 % — ABNORMAL LOW (ref 39.0–52.0)
Hemoglobin: 11.9 g/dL — ABNORMAL LOW (ref 13.0–17.0)
Lymphocytes Relative: 27 %
Lymphs Abs: 1.2 10*3/uL (ref 0.7–4.0)
MCH: 30.3 pg (ref 26.0–34.0)
MCHC: 33.8 g/dL (ref 30.0–36.0)
MCV: 89.6 fL (ref 78.0–100.0)
Monocytes Absolute: 0.4 10*3/uL (ref 0.1–1.0)
Monocytes Relative: 8 %
Neutro Abs: 2.7 10*3/uL (ref 1.7–7.7)
Neutrophils Relative %: 63 %
Platelets: 82 10*3/uL — ABNORMAL LOW (ref 150–400)
RBC: 3.93 MIL/uL — ABNORMAL LOW (ref 4.22–5.81)
RDW: 16.9 % — ABNORMAL HIGH (ref 11.5–15.5)
WBC: 4.3 10*3/uL (ref 4.0–10.5)

## 2016-03-13 LAB — I-STAT TROPONIN, ED
Troponin i, poc: 0.07 ng/mL (ref 0.00–0.08)
Troponin i, poc: 0 ng/mL (ref 0.00–0.08)

## 2016-03-13 LAB — TSH: TSH: 1.09 u[IU]/mL (ref 0.350–4.500)

## 2016-03-13 LAB — MAGNESIUM: Magnesium: 1.4 mg/dL — ABNORMAL LOW (ref 1.7–2.4)

## 2016-03-13 MED ORDER — RAMIPRIL 10 MG PO CAPS: 10.0000 mg | ORAL_CAPSULE | Freq: Two times a day (BID) | ORAL | 0 refills | Status: DC

## 2016-03-13 MED ORDER — RAMIPRIL 10 MG PO CAPS
10.0000 mg | ORAL_CAPSULE | Freq: Every day | ORAL | Status: DC
  Administered 2016-03-13: 10 mg via ORAL
  Filled 2016-03-13: qty 1

## 2016-03-13 MED ORDER — MAGNESIUM SULFATE 2 GM/50ML IV SOLN
2.0000 g | Freq: Once | INTRAVENOUS | Status: AC
  Administered 2016-03-13: 2 g via INTRAVENOUS
  Filled 2016-03-13: qty 50

## 2016-03-13 MED ORDER — FOLIC ACID 1 MG PO TABS
1.0000 mg | ORAL_TABLET | Freq: Once | ORAL | Status: AC
  Administered 2016-03-13: 1 mg via ORAL
  Filled 2016-03-13: qty 1

## 2016-03-13 MED ORDER — RAMIPRIL 10 MG PO CAPS
10.0000 mg | ORAL_CAPSULE | Freq: Every day | ORAL | Status: DC
  Filled 2016-03-13: qty 1

## 2016-03-13 MED ORDER — SPIRONOLACTONE 25 MG PO TABS
25.0000 mg | ORAL_TABLET | Freq: Once | ORAL | Status: AC
  Administered 2016-03-13: 25 mg via ORAL
  Filled 2016-03-13: qty 1

## 2016-03-13 MED ORDER — LABETALOL HCL 5 MG/ML IV SOLN
10.0000 mg | Freq: Once | INTRAVENOUS | Status: AC
  Administered 2016-03-13: 10 mg via INTRAVENOUS
  Filled 2016-03-13: qty 4

## 2016-03-13 MED ORDER — VITAMIN B-1 100 MG PO TABS
100.0000 mg | ORAL_TABLET | Freq: Once | ORAL | Status: AC
  Administered 2016-03-13: 100 mg via ORAL
  Filled 2016-03-13: qty 1

## 2016-03-13 MED ORDER — SPIRONOLACTONE 25 MG PO TABS: 25.0000 mg | ORAL_TABLET | Freq: Every day | ORAL | 0 refills | Status: DC

## 2016-03-13 MED ORDER — CARVEDILOL 25 MG PO TABS
25.0000 mg | ORAL_TABLET | Freq: Two times a day (BID) | ORAL | 0 refills | Status: DC
Start: 2016-03-13 — End: 2016-03-19

## 2016-03-13 MED ORDER — CARVEDILOL 12.5 MG PO TABS
12.5000 mg | ORAL_TABLET | Freq: Once | ORAL | Status: DC
  Filled 2016-03-13: qty 1

## 2016-03-13 MED ORDER — FOLIC ACID 1 MG PO TABS: 1.0000 mg | ORAL_TABLET | Freq: Every day | ORAL | 0 refills | Status: DC

## 2016-03-13 MED ORDER — HYDRALAZINE HCL 20 MG/ML IJ SOLN
10.0000 mg | Freq: Once | INTRAMUSCULAR | Status: AC
  Administered 2016-03-13: 10 mg via INTRAVENOUS
  Filled 2016-03-13: qty 1

## 2016-03-13 MED ORDER — VITAMIN B-1 100 MG PO TABS: 100.0000 mg | ORAL_TABLET | Freq: Every day | ORAL | 0 refills | Status: DC

## 2016-03-13 MED ORDER — CARVEDILOL 12.5 MG PO TABS
12.5000 mg | ORAL_TABLET | Freq: Once | ORAL | Status: AC
  Administered 2016-03-13: 12.5 mg via ORAL
  Filled 2016-03-13: qty 1

## 2016-03-13 MED ORDER — LORAZEPAM 2 MG/ML IJ SOLN
1.0000 mg | Freq: Once | INTRAMUSCULAR | Status: DC
  Filled 2016-03-13: qty 1

## 2016-03-13 MED ORDER — MAGNESIUM OXIDE 400 MG PO TABS: 400.0000 mg | ORAL_TABLET | Freq: Every day | ORAL | 0 refills | Status: DC

## 2016-03-13 MED ORDER — LORAZEPAM 2 MG/ML IJ SOLN
1.0000 mg | Freq: Once | INTRAMUSCULAR | Status: AC
  Administered 2016-03-13: 1 mg via INTRAVENOUS
  Filled 2016-03-13: qty 1

## 2016-03-13 MED ORDER — CARVEDILOL 12.5 MG PO TABS: 12.5000 mg | ORAL_TABLET | Freq: Once | ORAL | Status: DC

## 2016-03-13 MED ORDER — LORAZEPAM 2 MG/ML IJ SOLN
1.0000 mg | Freq: Once | INTRAMUSCULAR | Status: AC
  Administered 2016-03-13: 1 mg via INTRAVENOUS

## 2016-03-13 MED ORDER — LABETALOL HCL 5 MG/ML IV SOLN
20.0000 mg | Freq: Once | INTRAVENOUS | Status: AC
  Administered 2016-03-13: 20 mg via INTRAVENOUS
  Filled 2016-03-13: qty 4

## 2016-03-13 NOTE — ED Triage Notes (Signed)
Per [facility], pt states his family concerned for shaking the last day, hx of substance abuse also HTN, 190/100, noncompliant with medications. Pt states hes not shaking att his moment. Pt drank small amount of beer today. AAOX4.

## 2016-03-13 NOTE — ED Notes (Signed)
Pt to CT

## 2016-03-13 NOTE — Progress Notes (Addendum)
Spoke with ED physician, Dr. Erma Heritage, about this patient. Will have him follow up in our clinic tomorrow or Friday so we can optimize his BP control and work on gradually decreasing his alcohol use. Will send message to front desk so they can contact patient in AM.  Rich Number, MD, MPH Internal Medicine Resident, PGY-III Pager: [phone]

## 2016-03-13 NOTE — ED Provider Notes (Signed)
[facility]Y DEPT Provider Note   CSN: [id number] Arrival date & time: 03/13/16  1703     History   Chief Complaint Chief Complaint  Patient presents with  . Hypertension    HPI [name] Phillips is a 60 y.o. male.  HPI 60 year old male with past medical history of chronic alcoholism and CHF who presents with mild tremor. The patient states that over the last 24 hours, he has felt generally unwell with a mild, upper extremity tremor. He drinks several beers per day but denies any recent decrease in his alcohol intake. He states that he has a history of similar episodes when he stopped taking his medication. He states he has not taken any of his medications for the last 2 weeks and has felt generally unwell since then. Denies any associated chest pain. Denies any headache or vision changes.  Past Medical History:  Diagnosis Date  . AICD (automatic cardioverter/defibrillator) present 08/08/2011  . Alcohol abuse   . Arthritis   . CHF (congestive heart failure) (HCC)   . CVA (cerebral vascular accident) (HCC)   . Hypertension   . Nonischemic cardiomyopathy (HCC)    a. Catheterization 2005 normal coronary arteries; EF 15-20%; b. 2011 EF normal;  c. 2015 Echo: EF 25-30%;  d. s/p prior AICD with upgrade to MDT single lead ICD ser # [id number] h 2/2 ERI &12/19/2014).  . Seizure [facility](HCC)     Patient Active Problem List   Diagnosis Date Noted  . Preventative health care 08/09/2015  . Epistaxis 06/01/2015  . Need for prophylactic vaccination and inoculation against influenza 05/01/2015  . ED (erectile dysfunction) of non-organic origin 05/01/2015  . Chronic systolic CHF (congestive heart failure) (HCC) 12/20/2014  . NICM (nonischemic cardiomyopathy) (HCC) 12/19/2014  . Alcohol withdrawal delirium (HCC) 01/07/2014  . Seizure (HCC) 01/02/2014  . Hypothermia 01/02/2014  . Alcohol intoxication (HCC) 01/02/2014  . Cardiomyopathy, nonischemic (HCC) 01/02/2014  . Hypotension 01/02/2014  .    6949 Lead 11/12/2012  . Nonischemic cardiomyopathy (HCC)   . Seizure disorder (HCC) 08/08/2011  . Alcohol abuse 08/08/2011  . Tobacco abuse 08/08/2011  . HTN (hypertension) 08/08/2011  . Systolic CHF (HCC) 08/08/2011  . Stroke (HCC) 08/08/2011  . AICD (automatic cardioverter/defibrillator) present 08/08/2011  . Low magnesium levels 08/08/2011  . Hypokalemia 08/08/2011    Past Surgical History:  Procedure Laterality Date  . CARDIAC DEFIBRILLATOR PLACEMENT  2007   MDT [employer] single chamber ICD implanted with [id number] lead by Dr Ty HiltsEdmonds for primary prevention  . EP IMPLANTABLE DEVICE N/A 12/19/2014   Procedure:  ICD Generator Changeout;  Surgeon: Duke SalviaSteven C Klein, MD;  [facility];  Service: Cardiovascular;  Laterality: N/A;  . EP IMPLANTABLE DEVICE N/A 12/19/2014   Procedure: Lead Revision;  Surgeon: Duke SalviaSteven C Klein, MD;  [facility];  Service: Cardiovascular;  Laterality: N/A;  . ICD GENERATOR CHANGE  12/19/2014       Home Medications    Prior to Admission medications   Medication Sig Start Date End Date Taking? Authorizing Provider  aspirin EC 81 MG tablet Take 1 tablet (81 mg total) by mouth daily. 08/09/15 08/08/16 Yes Ejiroghene E Emokpae, MD  atorvastatin (LIPITOR) 20 MG tablet Take 1 tablet (20 mg total) by mouth daily. 12/06/15  Yes Fuller Planhristopher W Rice, MD  clopidogrel (PLAVIX) 75 MG tablet Take 1 tablet (75 mg total) by mouth daily with breakfast. 12/06/15  Yes Fuller Planhristopher W Rice, MD  sildenafil (VIAGRA) 50 MG tablet Take 1 tablet (50  mg total) by mouth as needed for erectile dysfunction. 06/01/15 05/31/16 Yes Lora Paula, MD  carvedilol (COREG) 25 MG tablet Take 1 tablet (25 mg total) by mouth 2 (two) times daily with a meal. 03/13/16   Shaune Pollack, MD  folic acid (FOLVITE) 1 MG tablet Take 1 tablet (1 mg total) by mouth daily. 03/13/16   Shaune Pollack, MD  magnesium oxide (MAG-OX) 400 MG tablet Take 1 tablet (400 mg total) by mouth daily.  03/13/16   Shaune Pollack, MD  ramipril (ALTACE) 10 MG capsule Take 1 capsule (10 mg total) by mouth 2 (two) times daily. 03/13/16   Shaune Pollack, MD  spironolactone (ALDACTONE) 25 MG tablet Take 1 tablet (25 mg total) by mouth daily. 03/13/16   Shaune Pollack, MD  thiamine (VITAMIN B-1) 100 MG tablet Take 1 tablet (100 mg total) by mouth daily. 03/13/16   Shaune Pollack, MD    Family History Family History  Problem Relation Age of Onset  . Heart disease Father   . Hypertension    . Diabetes type II Sister   . Seizures Brother   . Lupus Sister     Social History Social History  Substance Use Topics  . Smoking status: Current Every Day Smoker    Packs/day: 0.50    Years: 39.00    Types: Cigarettes  . Smokeless tobacco: Former Neurosurgeon    Types: Snuff, Chew     Comment: Or less.  . Alcohol use 1.8 oz/week    3 Cans of beer per week     Comment: 2x/month     Allergies   Codeine; Penicillins; and Prednisone   Review of Systems Review of Systems  Constitutional: Positive for fatigue. Negative for chills and fever.  HENT: Negative for congestion and rhinorrhea.   Eyes: Negative for visual disturbance.  Respiratory: Negative for cough, shortness of breath and wheezing.   Cardiovascular: Negative for chest pain and leg swelling.  Gastrointestinal: Negative for abdominal pain, diarrhea, nausea and vomiting.  Genitourinary: Negative for dysuria and flank pain.  Musculoskeletal: Negative for neck pain and neck stiffness.  Skin: Negative for rash and wound.  Allergic/Immunologic: Negative for immunocompromised state.  Neurological: Positive for tremors. Negative for syncope, weakness and headaches.  All other systems reviewed and are negative.    Physical Exam Updated Vital Signs BP 128/77   Pulse 79   Temp 98.7 F (37.1 C) (Oral)   Resp 15   SpO2 97%   Physical Exam  Constitutional: He is oriented to person, place, and time. He appears well-developed and  well-nourished. No distress.  HENT:  Head: Normocephalic and atraumatic.  Eyes: Conjunctivae are normal.  Neck: Neck supple.  Cardiovascular: Normal rate, regular rhythm and normal heart sounds.  Exam reveals no friction rub.   No murmur heard. Pulmonary/Chest: Effort normal and breath sounds normal. No respiratory distress. He has no wheezes. He has no rales.  Abdominal: He exhibits no distension.  Musculoskeletal: He exhibits no edema.  Neurological: He is alert and oriented to person, place, and time. He exhibits normal muscle tone.  Skin: Skin is warm. Capillary refill takes less than 2 seconds.  Psychiatric: He has a normal mood and affect.  Nursing note and vitals reviewed.  Neurological Exam:  Mental Status: Alert and oriented to person, place, and time. Attention and concentration normal. Speech clear. Recent memory is intact. Cranial Nerves: Visual fields intact to confrontation in all quadrants bilaterally. EOMI and PERRLA. No nystagmus noted. Facial sensation intact at  forehead, maxillary cheek, and chin/mandible bilaterally. No weakness of masticatory muscles. No facial asymmetry or weakness. Hearing grossly normal to finer rub. Uvula is midline, and palate elevates symmetrically. Normal SCM and trapezius strength. Tongue midline without fasciculations Motor: Muscle strength 5/5 in proximal and distal UE and LE bilaterally. No pronator drift. Muscle tone normal. Reflexes: 2+ and symmetrical in all four extremities.  Sensation: Intact to light touch in upper and lower extremities distally bilaterally.  Gait: Normal without ataxia. Coordination: Normal FTN bilaterally. Mild resting tremor in b/l UE.   ED Treatments / Results  Labs (all labs ordered are listed, but only abnormal results are displayed) Labs Reviewed  CBC WITH DIFFERENTIAL/PLATELET - Abnormal; Notable for the following:       Result Value   RBC 3.93 (*)    Hemoglobin 11.9 (*)    HCT 35.2 (*)    RDW 16.9 (*)      Platelets 82 (*)    All other components within normal limits  COMPREHENSIVE METABOLIC PANEL - Abnormal; Notable for the following:    Chloride 99 (*)    AST 106 (*)    ALT 71 (*)    Anion gap 17 (*)    All other components within normal limits  MAGNESIUM - Abnormal; Notable for the following:    Magnesium 1.4 (*)    All other components within normal limits  TSH  I-STAT TROPOININ, ED  I-STAT TROPOININ, ED    EKG  EKG Interpretation  Date/Time:  Wednesday March 13 2016 17:11:46 EDT Ventricular Rate:  86 PR Interval:    QRS Duration: 78 QT Interval:  339 QTC Calculation: 406 R Axis:   3 Text Interpretation:  Sinus rhythm Atrial premature complexes Left atrial enlargement Borderline low voltage, extremity leads LVH with secondary repolarization abnormality Baseline wander in lead(s) V3 abnormal T wave morphology in V5-V6 new from previous Confirmed by LITTLE MD, RACHEL [phone]) on 03/14/2016 2:11:31 PM       Radiology Ct Head Wo Contrast  Result Date: 03/13/2016 CLINICAL DATA:  Patient tremulous all day, history of substance abuse. Hypertensive. EXAM: CT HEAD WITHOUT CONTRAST TECHNIQUE: Contiguous axial images were obtained from the base of the skull through the vertex without intravenous contrast. COMPARISON:  10/29/2015 CT FINDINGS: Brain: Stable mild diffuse atrophy. No acute intracranial hemorrhage, mass or midline shift. No extra-axial fluid collections. Evidence of prior left occipital lobe infarct. Prior lacunar infarct in the right caudate. Chronic moderate periventricular white matter hypodensity consistent chronic small vessel ischemic disease. Diffuse sign Vascular: Mild-to-moderate atherosclerosis of the carotid siphons. Skull: No acute osseous abnormality Sinuses/Orbits: No acute finding. Other: None. IMPRESSION: Chronic stable atrophy and moderate chronic periventricular white matter small vessel ischemia. Chronic left occipital lobe infarct. No acute intracranial  abnormality. Electronically Signed   By: Tollie Eth M.D.   On: 03/13/2016 18:58    Procedures Procedures (including critical care time)  Medications Ordered in ED Medications  LORazepam (ATIVAN) injection 1 mg (1 mg Intravenous Given 03/13/16 1740)  thiamine (VITAMIN B-1) tablet 100 mg (100 mg Oral Given 03/13/16 1759)  folic acid (FOLVITE) tablet 1 mg (1 mg Oral Given 03/13/16 1759)  LORazepam (ATIVAN) injection 1 mg (1 mg Intravenous Given 03/13/16 1952)  carvedilol (COREG) tablet 12.5 mg (12.5 mg Oral Given 03/13/16 1952)  magnesium sulfate IVPB 2 g 50 mL (0 g Intravenous Stopped 03/13/16 2038)  spironolactone (ALDACTONE) tablet 25 mg (25 mg Oral Given 03/13/16 2116)  magnesium sulfate IVPB 2 g 50 mL (0  g Intravenous Stopped 03/13/16 2238)  labetalol (NORMODYNE,TRANDATE) injection 10 mg (10 mg Intravenous Given 03/13/16 2115)  labetalol (NORMODYNE,TRANDATE) injection 20 mg (20 mg Intravenous Given 03/13/16 2221)  hydrALAZINE (APRESOLINE) injection 10 mg (10 mg Intravenous Given 03/13/16 2251)     Initial Impression / Assessment and Plan / ED Course  I have reviewed the triage vital signs and the nursing notes.  Pertinent labs & imaging results that were available during my care of the patient were reviewed by me and considered in my medical decision making (see chart for details).  Clinical Course    60 yo M with PMHx of chronic EtOH abuse, HTN, non-ischemic CM s/p ICD placement, h/o chronic non-adherence here with elevated BP, mild b/l UE tremor. Pt has h/o similar sx. On arrival, pt markedly hypertensive but VS o/w stable. EKG is non-ischemic. Pt has mild resting tremor but o/w no focal neuro deficits. 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 Less likely HTN urgency as pt has no other AMS or signs of end-organ dysfunction. Will  check labs, give PO antiHTN, and re-assess. No dysarthria, dysphagia, vertigo, or signs to suggest posterior stroke/HTN emergency or PRESS.  Labs, imaging is as above. CT Head shows NAICA. Labs show marked hypomagnesemia, which has been repleted. HTN improved after PO meds and IV dose x 2. Pt tremor resolved after mag supplementation and he remains o/w asymptomatic and well-appearing. EKG is non-ischemic, trop negative x 2, no signs of other end-organ dysfunction. D/w Dr. Beckie Salts of IM teaching service - will arrange close f/u, start on thiamine/folate/mag, and restart home antiHTN meds. Discussed importance of f/u and adherence with antiHTN with pt in detail, as well as wife.  Final Clinical Impressions(s) / ED Diagnoses   Final diagnoses:  Essential hypertension  Hypomagnesemia  Tremor    New Prescriptions Discharge Medication List as of 03/14/2016 12:00 AM    START taking these medications   Details  folic acid (FOLVITE) 1 MG tablet Take 1 tablet (1 mg total) by mouth daily., Starting Wed 03/13/2016, Print    magnesium oxide (MAG-OX) 400 MG tablet Take 1 tablet (400 mg total) by mouth daily., Starting Wed 03/13/2016, Print    thiamine (VITAMIN B-1) 100 MG tablet Take 1 tablet (100 mg total) by mouth daily., Starting Wed 03/13/2016, Print         Shaune Pollack, MD 03/14/16 1440

## 2016-03-19 ENCOUNTER — Ambulatory Visit (INDEPENDENT_AMBULATORY_CARE_PROVIDER_SITE_OTHER): Payer: Medicare Other | Admitting: Internal Medicine

## 2016-03-19 VITALS — BP 128/84 | HR 69 | Temp 97.4°F | Ht 75.0 in | Wt 160.2 lb

## 2016-03-19 DIAGNOSIS — Z8673 Personal history of transient ischemic attack (TIA), and cerebral infarction without residual deficits: Secondary | ICD-10-CM | POA: Diagnosis not present

## 2016-03-19 DIAGNOSIS — F1020 Alcohol dependence, uncomplicated: Secondary | ICD-10-CM

## 2016-03-19 DIAGNOSIS — F17210 Nicotine dependence, cigarettes, uncomplicated: Secondary | ICD-10-CM

## 2016-03-19 DIAGNOSIS — I10 Essential (primary) hypertension: Secondary | ICD-10-CM | POA: Diagnosis not present

## 2016-03-19 DIAGNOSIS — F1010 Alcohol abuse, uncomplicated: Secondary | ICD-10-CM

## 2016-03-19 MED ORDER — NALTREXONE HCL 50 MG PO TABS: 50.0000 mg | ORAL_TABLET | Freq: Every day | ORAL | 0 refills | Status: DC

## 2016-03-19 MED ORDER — MAGNESIUM OXIDE 400 MG PO TABS: 400.0000 mg | ORAL_TABLET | Freq: Every day | ORAL | 0 refills | Status: DC

## 2016-03-19 MED ORDER — CARVEDILOL 25 MG PO TABS: 25.0000 mg | ORAL_TABLET | Freq: Two times a day (BID) | ORAL | 0 refills | Status: DC

## 2016-03-19 MED ORDER — VITAMIN B-1 100 MG PO TABS: 100.0000 mg | ORAL_TABLET | Freq: Every day | ORAL | 0 refills | Status: DC

## 2016-03-19 MED ORDER — FOLIC ACID 1 MG PO TABS: 1.0000 mg | ORAL_TABLET | Freq: Every day | ORAL | 0 refills | Status: DC

## 2016-03-19 MED ORDER — RAMIPRIL 10 MG PO CAPS: 10.0000 mg | ORAL_CAPSULE | Freq: Two times a day (BID) | ORAL | 0 refills | Status: DC

## 2016-03-19 MED ORDER — SPIRONOLACTONE 25 MG PO TABS: 25.0000 mg | ORAL_TABLET | Freq: Every day | ORAL | 0 refills | Status: DC

## 2016-03-19 NOTE — Patient Instructions (Addendum)
Mr. Sculthorpe it was nice meeting you today.  -Start taking Naltrexone as instructed  -Continue taking your blood pressure medications regularly   -You have an appointment with Dr. Dimple Casey on 05/10/2016 at 1:45 pm

## 2016-03-20 NOTE — Assessment & Plan Note (Signed)
Assessment Note from recent ED visit mentioned patient having an elevated blood pressure. Blood pressure 128/84 today. He is currently taking carvedilol 25 mg twice daily, ramipril 10 mg twice daily, and spironolactone 25 mg daily.  Plan -Continue current management

## 2016-03-20 NOTE — Assessment & Plan Note (Signed)
History of present illness Patient presented to the emergency room on October 18 with a complaint of mild bilateral upper extremity tremors. In the ED, EKG was nonischemic and troponin negative 2. CT of head showed chronic stable atrophy and moderate chronic periventricular white matter small vessel ischemia. Also showing chronic left occipital lobe infarct. Was found to have hypokalemia and hypomagnesemia at his ED visit. Patient was discharged home with prescriptions for thiamine, folate, and magnesium. At present, patient states his tremors have resolved. States he is taking folate every day and was not aware that he had to take thiamine and magnesium as well. States he likes to drink alcohol "every chance I get." Cage questionnaire positive for one question (needs an eye opener in the morning). States he drinks on average one to 3 beers 5 days a week. 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 I had a lengthy conversation with the patient discussing several treatment options including outpatient programs but he declined help. I also discussed starting him on naltrexone and patient agreed to taking this medication.  Plan -Naltrexone 50 mg daily -Advised him to take folate, thiamine, and magnesium daily. Prescriptions have been sent to his pharmacy. -He has an appointment with his PCP on December 15

## 2016-03-20 NOTE — Progress Notes (Signed)
   CC: Patient is here for an ED follow-up. Hypertension and alcohol abuse were discussed during this visit.  HPI:  [name] is a 60 y.o.  male with a past medical history of conditions listed below presenting to the clinic for an ED follow-up. Hypertension and alcohol abuse were discussed during this visit.  Past Medical History:  Diagnosis Date  . AICD (automatic cardioverter/defibrillator) present 08/08/2011  . Alcohol abuse   . Arthritis   . CHF (congestive heart failure) (HCC)   . CVA (cerebral vascular accident) (HCC)   . Hypertension   . Nonischemic cardiomyopathy (HCC)    a. Catheterization 2005 normal coronary arteries; EF 15-20%; b. 2011 EF normal;  c. 2015 Echo: EF 25-30%;  d. s/p prior AICD with upgrade to MDT single lead ICD ser # [id number] h 2/2 ERI &12/19/2014).  . Seizure [facility])     Review of Systems:  Pertinent positives mentioned in HPI. Remainder of all ROS negative.   Physical Exam:  Vitals:   03/19/16 1039  BP: 128/84  Pulse: 69  Temp: 97.4 F (36.3 C)  TempSrc: Oral  SpO2: 100%  Weight: 160 lb 3.2 oz (72.7 kg)  Height: 6\' 3"  (1.905 m)   Physical Exam  Constitutional: He is oriented to person, place, and time. He appears well-developed and well-nourished. No distress.  No tremors noted  HENT:  Head: Normocephalic and atraumatic.  Mouth/Throat: Oropharynx is clear and moist.  Eyes: EOM are normal.  Neck: Neck supple. No tracheal deviation present.  Cardiovascular: Normal rate, regular rhythm and intact distal pulses.   Pulmonary/Chest: Effort normal and breath sounds normal. No respiratory distress.  Abdominal: Soft. Bowel sounds are normal. He exhibits no distension. There is no tenderness.  Musculoskeletal: Normal range of motion. He exhibits no edema.  Neurological: He is alert and oriented to person, place, and time.  Skin: Skin is warm and dry.    Assessment & Plan:   See Encounters Tab for problem based charting.  Patient discussed  with Dr. Cleda Daub

## 2016-03-22 NOTE — Progress Notes (Signed)
Internal Medicine Clinic Attending  Case discussed with Dr. Rathoreat the time of the visit. We reviewed the resident's history and exam and pertinent patient test results. I agree with the assessment, diagnosis, and plan of care documented in the resident's note.  

## 2016-05-09 ENCOUNTER — Telehealth: Payer: Self-pay | Admitting: Internal Medicine

## 2016-05-09 NOTE — Telephone Encounter (Signed)
APT. REMINDER CALL, LMTCB °

## 2016-05-10 ENCOUNTER — Encounter: Payer: Medicare Other | Admitting: Internal Medicine

## 2016-08-14 ENCOUNTER — Other Ambulatory Visit: Payer: Self-pay | Admitting: Internal Medicine

## 2016-08-14 DIAGNOSIS — I10 Essential (primary) hypertension: Secondary | ICD-10-CM

## 2016-08-23 NOTE — Progress Notes (Deleted)
Electrophysiology Office Note Date: 08/23/2016  ID:  Braulio, Turnquist 10-Mar-1956, MRN [id number]  PCP: Pincus Badder, MD Primary Cardiologist: Shana Chute Electrophysiologist: Graciela Husbands  CC: Routine ICD follow-up  [name] Phillips is a 61 y.o. male seen today for Dr Graciela Husbands. He presents today for routine electrophysiology followup.  Since last being seen in our clinic, the patient reports doing very well. He denies chest pain, palpitations, dyspnea, PND, orthopnea, nausea, vomiting, dizziness, syncope, edema, weight gain, or early satiety.  He has not had ICD shocks.   Device History: MDT single chamber ICD implanted 2007 for NICM; gen change with RV lead revision 2016 (previous 6949 lead) History of appropriate therapy: no History of AAD therapy: no   Past Medical History:  Diagnosis Date  . AICD (automatic cardioverter/defibrillator) present 08/08/2011  . Alcohol abuse   . Arthritis   . CHF (congestive heart failure) (HCC)   . CVA (cerebral vascular accident) (HCC)   . Hypertension   . Nonischemic cardiomyopathy (HCC)    a. Catheterization 2005 normal coronary arteries; EF 15-20%; b. 2011 EF normal;  c. 2015 Echo: EF 25-30%;  d. s/p prior AICD with upgrade to MDT single lead ICD ser # [id number] h 2/2 ERI &12/19/2014).  . Seizure [facility])    Past Surgical History:  Procedure Laterality Date  . CARDIAC DEFIBRILLATOR PLACEMENT  2007   MDT [employer] single chamber ICD implanted with [phone] lead by Dr Ty Hilts for primary prevention  . EP IMPLANTABLE DEVICE N/A 12/19/2014   Procedure:  ICD Generator Changeout;  Surgeon: Duke Salvia, MD;  Location: [facility] INVASIVE CV LAB;  Service: Cardiovascular;  Laterality: N/A;  . EP IMPLANTABLE DEVICE N/A 12/19/2014   Procedure: Lead Revision;  Surgeon: Duke Salvia, MD;  Location: [facility] INVASIVE CV LAB;  Service: Cardiovascular;  Laterality: N/A;  . ICD GENERATOR CHANGE  12/19/2014    Current Outpatient Prescriptions  Medication Sig Dispense Refill  .  atorvastatin (LIPITOR) 20 MG tablet Take 1 tablet (20 mg total) by mouth daily. 30 tablet 3  . carvedilol (COREG) 25 MG tablet TAKE 1 TABLET BY MOUTH TWO  TIMES DAILY WITH MEALS 180 tablet 1  . clopidogrel (PLAVIX) 75 MG tablet Take 1 tablet (75 mg total) by mouth daily with breakfast. 30 tablet 3  . folic acid (FOLVITE) 1 MG tablet Take 1 tablet (1 mg total) by mouth daily. 90 tablet 0  . magnesium oxide (MAG-OX) 400 MG tablet Take 1 tablet (400 mg total) by mouth daily. 90 tablet 0  . naltrexone (DEPADE) 50 MG tablet Take 1 tablet (50 mg total) by mouth daily. 90 tablet 0  . ramipril (ALTACE) 10 MG capsule TAKE 1 CAPSULE BY MOUTH TWO TIMES DAILY 180 capsule 1  . sildenafil (VIAGRA) 50 MG tablet Take 1 tablet (50 mg total) by mouth as needed for erectile dysfunction. 20 tablet 1  . spironolactone (ALDACTONE) 25 MG tablet TAKE 1 TABLET BY MOUTH  DAILY 90 tablet 1  . thiamine (VITAMIN B-1) 100 MG tablet Take 1 tablet (100 mg total) by mouth daily. 90 tablet 0   No current facility-administered medications for this visit.     Allergies:   Codeine; Penicillins; and Prednisone   Social History: Social History   Social History  . Marital status: Married    Spouse name: N/A  . Number of children: N/A  . Years of education: N/A   Occupational History  . Not on file.   Social History Main Topics  .  Smoking status: Current Every Day Smoker    Packs/day: 0.50    Years: 39.00    Types: Cigarettes  . Smokeless tobacco: Former Neurosurgeon    Types: Snuff, Chew     Comment: Or less.  . Alcohol use 1.8 oz/week    3 Cans of beer per week     Comment: 2x/month  . Drug use: No     Comment: Former crack cocaine use for several years, last 9 years ago  . Sexual activity: Not on file   Other Topics Concern  . Not on file   Social History Narrative  . No narrative on file    Family History: Family History  Problem Relation Age of Onset  . Heart disease Father   . Hypertension    . Diabetes  type II Sister   . Seizures Brother   . Lupus Sister     Review of Systems: All other systems reviewed and are otherwise negative except as noted above.   Physical Exam: VS:  There were no vitals taken for this visit. , BMI There is no height or weight on file to calculate BMI.  GEN- The patient is well appearing, alert and oriented x 3 today.   HEENT: normocephalic, atraumatic; sclera clear, conjunctiva pink; hearing intact; oropharynx clear; neck supple, no JVP Lymph- no cervical lymphadenopathy Lungs- Clear to ausculation bilaterally, normal work of breathing.  No wheezes, rales, rhonchi Heart- Regular rate and rhythm, no murmurs, rubs or gallops, PMI not laterally displaced GI- soft, non-tender, non-distended, bowel sounds present, no hepatosplenomegaly Extremities- no clubbing, cyanosis, or edema; DP/PT/radial pulses 2+ bilaterally MS- no significant deformity or atrophy Skin- warm and dry, no rash or lesion; ICD pocket well healed Psych- euthymic mood, full affect Neuro- strength and sensation are intact  ICD interrogation- reviewed in detail today,  See PACEART report  EKG:  EKG is ordered today. The ekg ordered today shows ***  Recent Labs: 03/13/2016: ALT 71; BUN 12; Creatinine, Ser 1.24; Hemoglobin 11.9; Magnesium 1.4; Platelets 82; Potassium 4.4; Sodium 139; TSH 1.090   Wt Readings from Last 3 Encounters:  03/19/16 160 lb 3.2 oz (72.7 kg)  08/09/15 174 lb (78.9 kg)  06/01/15 170 lb 11.2 oz (77.4 kg)     Other studies Reviewed: Additional studies/ records that were reviewed today include: Dr Odessa Fleming office notes  Assessment and Plan:  1.  Chronic systolic dysfunction euvolemic today Stable on an appropriate medical regimen Normal ICD function See Pace Art report No changes today  2.  HTN Stable No change required today  3.  SVT No clinical recurrence  4.  Non compliance with device follow up Longstanding issue Importance of compliance discussed at  length today    Current medicines are reviewed at length with the patient today.   The patient {ACTIONS; HAS/DOES NOT HAVE:19233} concerns regarding his medicines.  The following changes were made today:  {NONE DEFAULTED:18576::"none"}  Labs/ tests ordered today include: *** No orders of the defined types were placed in this encounter.    Disposition:   Follow up with [facility], Dr Graciela Husbands 1 year    Signed, Gypsy Balsam, NP 08/23/2016 11:35 AM  [facility] [address] [place] [address] [phone] (office) [phone] (fax

## 2016-08-26 ENCOUNTER — Encounter: Payer: Medicare Other | Admitting: Nurse Practitioner

## 2016-08-27 ENCOUNTER — Encounter: Payer: Self-pay | Admitting: Cardiology

## 2016-08-28 ENCOUNTER — Encounter: Payer: Self-pay | Admitting: Nurse Practitioner

## 2016-08-30 ENCOUNTER — Encounter: Payer: Medicare Other | Admitting: Internal Medicine

## 2016-09-05 NOTE — Progress Notes (Deleted)
Cardiology Office Note Date:  09/05/2016  Patient ID:  Antwain, Dam Jan 17, 1956, MRN [id number] PCP:  Pincus Badder, MD  Cardiologist:  Dr. Sharyn Lull Electrophysiologist: Dr. Graciela Husbands  ***refresh   Chief Complaint: past due EP/device visit  History of Present Illness: KHRISTIAN BOLAND is a 61 y.o. male with history of NICM w/ICD, syncope noted w/SVT sensitive to adenosine, HTN, CRI, seizure d/o (?2/2ETOH), CVA comes to the office today to be seen for dr. Graciela Husbands, last seen by him July 2016, at that time planned for gen change noting ERI.   *** symptoms *** fluid status *** meds *** still seeing Dr. Sharyn Lull, ?testing  Device information: MDT single lead ICD, implanted 2007, gen change, new RV lead (initial was 6949 lead) 12/19/14  Past Medical History:  Diagnosis Date  . AICD (automatic cardioverter/defibrillator) present 08/08/2011  . Alcohol abuse   . Arthritis   . CHF (congestive heart failure) (HCC)   . CVA (cerebral vascular accident) (HCC)   . Hypertension   . Nonischemic cardiomyopathy (HCC)    a. Catheterization 2005 normal coronary arteries; EF 15-20%; b. 2011 EF normal;  c. 2015 Echo: EF 25-30%;  d. s/p prior AICD with upgrade to MDT single lead ICD ser # [id number] h 2/2 ERI &12/19/2014).  . Seizure [facility])     Past Surgical History:  Procedure Laterality Date  . CARDIAC DEFIBRILLATOR PLACEMENT  2007   MDT [employer] single chamber ICD implanted with [phone] lead by Dr Ty Hilts for primary prevention  . EP IMPLANTABLE DEVICE N/A 12/19/2014   Procedure:  ICD Generator Changeout;  Surgeon: Duke Salvia, MD;  Location: [facility] INVASIVE CV LAB;  Service: Cardiovascular;  Laterality: N/A;  . EP IMPLANTABLE DEVICE N/A 12/19/2014   Procedure: Lead Revision;  Surgeon: Duke Salvia, MD;  Location: [facility] INVASIVE CV LAB;  Service: Cardiovascular;  Laterality: N/A;  . ICD GENERATOR CHANGE  12/19/2014    Current Outpatient Prescriptions  Medication Sig Dispense Refill  . atorvastatin (LIPITOR)  20 MG tablet Take 1 tablet (20 mg total) by mouth daily. 30 tablet 3  . carvedilol (COREG) 25 MG tablet TAKE 1 TABLET BY MOUTH TWO  TIMES DAILY WITH MEALS 180 tablet 1  . clopidogrel (PLAVIX) 75 MG tablet Take 1 tablet (75 mg total) by mouth daily with breakfast. 30 tablet 3  . folic acid (FOLVITE) 1 MG tablet Take 1 tablet (1 mg total) by mouth daily. 90 tablet 0  . magnesium oxide (MAG-OX) 400 MG tablet Take 1 tablet (400 mg total) by mouth daily. 90 tablet 0  . naltrexone (DEPADE) 50 MG tablet Take 1 tablet (50 mg total) by mouth daily. 90 tablet 0  . ramipril (ALTACE) 10 MG capsule TAKE 1 CAPSULE BY MOUTH TWO TIMES DAILY 180 capsule 1  . sildenafil (VIAGRA) 50 MG tablet Take 1 tablet (50 mg total) by mouth as needed for erectile dysfunction. 20 tablet 1  . spironolactone (ALDACTONE) 25 MG tablet TAKE 1 TABLET BY MOUTH  DAILY 90 tablet 1  . thiamine (VITAMIN B-1) 100 MG tablet Take 1 tablet (100 mg total) by mouth daily. 90 tablet 0   No current facility-administered medications for this visit.     Allergies:   Codeine; Penicillins; and Prednisone   Social History:  The patient  reports that he has been smoking Cigarettes.  He has a 19.50 pack-year smoking history. He has quit using smokeless tobacco. His smokeless tobacco use included Snuff and Chew. He reports that he drinks about  1.8 oz of alcohol per week . He reports that he does not use drugs.   Family History:  The patient's family history includes Diabetes type II in his sister; Heart disease in his father; Lupus in his sister; Seizures in his brother.  ROS:  Please see the history of present illness.   All other systems are reviewed and otherwise negative.   PHYSICAL EXAM: *** VS:  There were no vitals taken for this visit. BMI: There is no height or weight on file to calculate BMI. Well nourished, well developed, in no acute distress  HEENT: normocephalic, atraumatic  Neck: no JVD, carotid bruits or masses Cardiac:  *** RRR;  no significant murmurs, no rubs, or gallops Lungs:  *** CTA b/l, no wheezing, rhonchi or rales  Abd: soft, nontender MS: no deformity or atrophy Ext: *** no edema  Skin: warm and dry, no rash Neuro:  No gross deficits appreciated Psych: euthymic mood, full affect  *** ICD site is stable, no tethering or discomfort   EKG:  Done 03/13/16 SR  Recent Labs: 03/13/2016: ALT 71; BUN 12; Creatinine, Ser 1.24; Hemoglobin 11.9; Magnesium 1.4; Platelets 82; Potassium 4.4; Sodium 139; TSH 1.090  No results found for requested labs within last 8760 hours.   CrCl cannot be calculated (Patient's most recent lab result is older than the maximum 21 days allowed.).   Wt Readings from Last 3 Encounters:  03/19/16 160 lb 3.2 oz (72.7 kg)  08/09/15 174 lb (78.9 kg)  06/01/15 170 lb 11.2 oz (77.4 kg)     Other studies reviewed: Additional studies/records reviewed today include: summarized above  ASSESSMENT AND PLAN:  1. ICD     *** stable device function  2. NICM     ***    Disposition: F/u with ***  Current medicines are reviewed at length with the patient today.  The patient did not have any concerns regarding medicines.***  Signed, Sherrilee Gilles, PA-C 09/05/2016 5:55 PM     [facility] [address] [place] [address] [phone] (office)  [phone] (fax)

## 2016-09-06 ENCOUNTER — Encounter: Payer: Medicare Other | Admitting: Physician Assistant

## 2016-10-15 NOTE — Progress Notes (Deleted)
Cardiology Office Note Date:  10/15/2016  Patient ID:  [name] Phillips, [name] Phillips 21-Nov-1955, MRN [id number] PCP:  Fuller Plan, MD  Cardiologist:  Dr. Shana Chute Electrophysiologist: Dr. Graciela Husbands  ***refresh   Chief Complaint:   History of Present Illness: [name] Phillips is a 61 y.o. male with history of NICM w/ICD,  HTN, SVT (adenosine sensitive), seizure d/o (suspect 2/2 ETOH abuse), known PFO, CVA, non-compliance with f/u.  He comes in today to be seen for Dr. Graciela Husbands, last seen by him in July 2016, at that visit noted poor device check compliance despite multiple letters to the patient, his device had been beeping for 6 months and noted to be ERI, and underwent ICD gen change as well as new RV lead (prior was 6949 lead).  *** symptoms *** shocks, syncope *** labs *** meds   Device information: MDT single lead ICD< implanted 12/19/14, Dr. Graciela Husbands (capped [phone] lead in place), original implant 2007   Past Medical History:  Diagnosis Date  . AICD (automatic cardioverter/defibrillator) present 08/08/2011  . Alcohol abuse   . Arthritis   . CHF (congestive heart failure) (HCC)   . CVA (cerebral vascular accident) (HCC)   . Hypertension   . Nonischemic cardiomyopathy (HCC)    a. Catheterization 2005 normal coronary arteries; EF 15-20%; b. 2011 EF normal;  c. 2015 Echo: EF 25-30%;  d. s/p prior AICD with upgrade to MDT single lead ICD ser # [id number] h 2/2 ERI &12/19/2014).  . Seizure [facility])     Past Surgical History:  Procedure Laterality Date  . CARDIAC DEFIBRILLATOR PLACEMENT  2007   MDT [employer] single chamber ICD implanted with [phone] lead by Dr Ty Hilts for primary prevention  . EP IMPLANTABLE DEVICE N/A 12/19/2014   Procedure:  ICD Generator Changeout;  Surgeon: Duke Salvia, MD;  Location: [facility] INVASIVE CV LAB;  Service: Cardiovascular;  Laterality: N/A;  . EP IMPLANTABLE DEVICE N/A 12/19/2014   Procedure: Lead Revision;  Surgeon: Duke Salvia, MD;  Location: [facility] INVASIVE CV LAB;   Service: Cardiovascular;  Laterality: N/A;  . ICD GENERATOR CHANGE  12/19/2014    Current Outpatient Prescriptions  Medication Sig Dispense Refill  . atorvastatin (LIPITOR) 20 MG tablet Take 1 tablet (20 mg total) by mouth daily. 30 tablet 3  . carvedilol (COREG) 25 MG tablet TAKE 1 TABLET BY MOUTH TWO  TIMES DAILY WITH MEALS 180 tablet 1  . clopidogrel (PLAVIX) 75 MG tablet Take 1 tablet (75 mg total) by mouth daily with breakfast. 30 tablet 3  . folic acid (FOLVITE) 1 MG tablet Take 1 tablet (1 mg total) by mouth daily. 90 tablet 0  . magnesium oxide (MAG-OX) 400 MG tablet Take 1 tablet (400 mg total) by mouth daily. 90 tablet 0  . naltrexone (DEPADE) 50 MG tablet Take 1 tablet (50 mg total) by mouth daily. 90 tablet 0  . ramipril (ALTACE) 10 MG capsule TAKE 1 CAPSULE BY MOUTH TWO TIMES DAILY 180 capsule 1  . sildenafil (VIAGRA) 50 MG tablet Take 1 tablet (50 mg total) by mouth as needed for erectile dysfunction. 20 tablet 1  . spironolactone (ALDACTONE) 25 MG tablet TAKE 1 TABLET BY MOUTH  DAILY 90 tablet 1  . thiamine (VITAMIN B-1) 100 MG tablet Take 1 tablet (100 mg total) by mouth daily. 90 tablet 0   No current facility-administered medications for this visit.     Allergies:   Codeine; Penicillins; and Prednisone   Social History:  The patient  reports that  he has been smoking Cigarettes.  He has a 19.50 pack-year smoking history. He has quit using smokeless tobacco. His smokeless tobacco use included Snuff and Chew. He reports that he drinks about 1.8 oz of alcohol per week . He reports that he does not use drugs.   Family History:  The patient's family history includes Diabetes type II in his sister; Heart disease in his father; Lupus in his sister; Seizures in his brother.  ROS:  Please see the history of present illness.  All other systems are reviewed and otherwise negative.   PHYSICAL EXAM: *** VS:  There were no vitals taken for this visit. BMI: There is no height or weight  on file to calculate BMI. Well nourished, well developed, in no acute distress  HEENT: normocephalic, atraumatic  Neck: no JVD, carotid bruits or masses Cardiac:  *** RRR; no significant murmurs, no rubs, or gallops Lungs:  *** CTA b/l, no wheezing, rhonchi or rales  Abd: soft, nontender MS: no deformity or atrophy Ext: *** no edema  Skin: warm and dry, no rash Neuro:  No gross deficits appreciated Psych: euthymic mood, full affect  *** ICD site is stable, no tethering or discomfort   EKG:  Done today shows *** ICD interrogation done today by industry and reviewed by myself: ***  10/28/13: TTE Study Conclusions - Left ventricle: The cavity size was mildly dilated. Systolic function was severely reduced. The estimated ejection fraction was in the range of 25% to 30%. Severe hypokinesis of the inferior myocardium. - Mitral valve: There was mild regurgitation. - Left atrium: The atrium was moderately to severely dilated. - Right atrium: The atrium was moderately dilated.   Recent Labs: 03/13/2016: ALT 71; BUN 12; Creatinine, Ser 1.24; Hemoglobin 11.9; Magnesium 1.4; Platelets 82; Potassium 4.4; Sodium 139; TSH 1.090  No results found for requested labs within last 8760 hours.   CrCl cannot be calculated (Patient's most recent lab result is older than the maximum 21 days allowed.).   Wt Readings from Last 3 Encounters:  03/19/16 160 lb 3.2 oz (72.7 kg)  08/09/15 174 lb (78.9 kg)  06/01/15 170 lb 11.2 oz (77.4 kg)     Other studies reviewed: Additional studies/records reviewed today include: summarized above  ASSESSMENT AND PLAN:  1. NICM     ***  2. ICD     ***   3. HTN    ***    Disposition: F/u with ***  Current medicines are reviewed at length with the patient today.  The patient did not have any concerns regarding medicines.***  Signed, Sherrilee Gilles, PA-C 10/15/2016 6:09 AM     [facility] [address] [place]  [address] [phone] (office)  [phone] (fax)

## 2016-10-16 ENCOUNTER — Encounter: Payer: Medicare Other | Admitting: Physician Assistant

## 2016-10-17 ENCOUNTER — Encounter: Payer: Self-pay | Admitting: Physician Assistant

## 2017-01-16 ENCOUNTER — Other Ambulatory Visit: Payer: Self-pay | Admitting: Internal Medicine

## 2017-03-04 ENCOUNTER — Ambulatory Visit (INDEPENDENT_AMBULATORY_CARE_PROVIDER_SITE_OTHER): Payer: Medicare Other | Admitting: Internal Medicine

## 2017-03-04 ENCOUNTER — Encounter (INDEPENDENT_AMBULATORY_CARE_PROVIDER_SITE_OTHER): Payer: Self-pay

## 2017-03-04 VITALS — BP 149/102 | HR 67 | Temp 97.6°F | Ht 75.0 in | Wt 161.7 lb

## 2017-03-04 DIAGNOSIS — Z79899 Other long term (current) drug therapy: Secondary | ICD-10-CM

## 2017-03-04 DIAGNOSIS — Z0000 Encounter for general adult medical examination without abnormal findings: Secondary | ICD-10-CM

## 2017-03-04 DIAGNOSIS — I110 Hypertensive heart disease with heart failure: Secondary | ICD-10-CM | POA: Diagnosis not present

## 2017-03-04 DIAGNOSIS — Z23 Encounter for immunization: Secondary | ICD-10-CM | POA: Diagnosis not present

## 2017-03-04 DIAGNOSIS — F17210 Nicotine dependence, cigarettes, uncomplicated: Secondary | ICD-10-CM

## 2017-03-04 DIAGNOSIS — Z720 Tobacco use: Secondary | ICD-10-CM

## 2017-03-04 DIAGNOSIS — Z8673 Personal history of transient ischemic attack (TIA), and cerebral infarction without residual deficits: Secondary | ICD-10-CM | POA: Diagnosis not present

## 2017-03-04 DIAGNOSIS — F1020 Alcohol dependence, uncomplicated: Secondary | ICD-10-CM

## 2017-03-04 DIAGNOSIS — Z7982 Long term (current) use of aspirin: Secondary | ICD-10-CM

## 2017-03-04 DIAGNOSIS — I10 Essential (primary) hypertension: Secondary | ICD-10-CM

## 2017-03-04 DIAGNOSIS — I5022 Chronic systolic (congestive) heart failure: Secondary | ICD-10-CM

## 2017-03-04 DIAGNOSIS — Z95810 Presence of automatic (implantable) cardiac defibrillator: Secondary | ICD-10-CM | POA: Diagnosis not present

## 2017-03-04 DIAGNOSIS — F1010 Alcohol abuse, uncomplicated: Secondary | ICD-10-CM

## 2017-03-04 MED ORDER — NALTREXONE HCL 50 MG PO TABS: 100.0000 mg | ORAL_TABLET | Freq: Every day | ORAL | 1 refills | Status: DC

## 2017-03-04 NOTE — Assessment & Plan Note (Signed)
Patient with about 39 pack years, currently smoking 1 PPD. He is not interested in cutting back further at this time. He is counseled on smoking cessation and advised to call us for further assistance if needed. 1-800-QUIT-NOW number provided.

## 2017-03-04 NOTE — Assessment & Plan Note (Signed)
Patient with known non-ischemic HFrEF with EF 25-30% three years ago. He is on maximum doses of Carvedilol 25 mg BID and Ramipril 10 mg BID. He is on Spironolactone 25 mg daily. He says he is following with Dr. Sharyn Lull. He denies any symptoms of heart failure and is euvolemic on exam. Will try to obtain records from Dr. Annitta Jersey office and continue current management. Suspect he is not on lasix due to minimal symptoms and history of hypokalemia. - Continue Carvedilol 25 mg BID, Spironolactone 25 mg daily, and Ramipril 10 mg BID - Check CMET

## 2017-03-04 NOTE — Progress Notes (Signed)
CC: HTN  HPI:  [name] is a 61 y.o. male with PMH as listed below including CVA, HTN, Chronic Systolic CHF (EF 09-81% by TTE 2015), NICD s/p AICD, Tobacco use, and Alcohol use who presents for follow up management of the above.  Chronic Systolic Heart Failure (EF 25-30% by TTE in 2015): He is currently taking Carvedilol 25 mg BID, Spironolactone 25 mg daily, and Ramipril 10 mg BID. He is not on lasix. He was last seen by [place] 1 year ago. His weight today is 161 lbs compared to 160 lbs last year. He denies any SOB or dyspnea on exertion unless he is walking long distances while smoking. He denies any swelling in his feet or legs. He says he saw Cardiology, Dr. Sharyn Lull, about 2 weeks ago. He reports continued tobacco and alcohol use.  HTN: Currently taking Carvedilol 25 mg BID, Spironolactone 25 mg daily, and Ramipril 10 mg BID. He says he did not take his medication this morning. His BP on arrival was 193/109 and 149/102 on recheck.  Reported History of CVA: Patient reports a history of multiple strokes. Last CT head on file from 10/29/2015 showed changes consistent with old infarcts, largest in the left occipital lobe. He is currently taking ASA 81 mg daily and Plavix 75 mg daily.  Tobacco use: Patient reports smoking cigarettes, 1 PPD for about 39 years. He has no interest in cutting back.  Alcohol use: Patient reports drinking 1/5th of vodka or gin daily in addition to two 24 oz cans of beer. He was started on Naltrexone 50 mg daily one year ago in attempts to assist with cutting back. He says that he does think he has cut back some on his alcohol use.  Preventative Health Care: Patient eligible for flu shot today and due for colorectal cancer screening and HIV screen.    Past Medical History:  Diagnosis Date  . AICD (automatic cardioverter/defibrillator) present 08/08/2011  . Alcohol abuse   . Arthritis   . CHF (congestive heart failure) (HCC)   . CVA (cerebral vascular  accident) (HCC)   . Hypertension   . Nonischemic cardiomyopathy (HCC)    a. Catheterization 2005 normal coronary arteries; EF 15-20%; b. 2011 EF normal;  c. 2015 Echo: EF 25-30%;  d. s/p prior AICD with upgrade to MDT single lead ICD ser # [id number] h 2/2 ERI &12/19/2014).  . Seizure (HCC)    Review of Systems:   Review of Systems  Constitutional: Negative for chills, diaphoresis, fever and malaise/fatigue.  HENT: Negative for nosebleeds.   Respiratory: Negative for cough, hemoptysis, sputum production and shortness of breath.   Cardiovascular: Negative for chest pain, palpitations and leg swelling.  Gastrointestinal: Negative for blood in stool, constipation, diarrhea, melena, nausea and vomiting.  Genitourinary: Negative for dysuria and hematuria.  Neurological: Negative for dizziness and seizures.     Physical Exam:  Vitals:   03/04/17 1342 03/04/17 1449  BP: (!) 193/109 (!) 149/102  Pulse: 77 67  Temp: 97.6 F (36.4 C)   TempSrc: Oral   SpO2: 100%   Weight: 161 lb 11.2 oz (73.3 kg)   Height:  (1.905 m)    Physical Exam  Constitutional: He appears well-developed and well-nourished. No distress.  HENT:  Head: Normocephalic and atraumatic.  Cardiovascular: Normal rate, regular rhythm and intact distal pulses.   Pulmonary/Chest: Effort normal. No respiratory distress. He has no wheezes. He has no rales.  Abdominal: Soft. Bowel sounds are normal. He exhibits no distension  and no mass. There is no tenderness.  Musculoskeletal: Normal range of motion. He exhibits no edema or tenderness.  Neurological: He is alert.  Skin: Skin is warm. He is not diaphoretic.    Assessment & Plan:   See Encounters Tab for problem based charting.  Patient seen with Dr. Oswaldo Done  Systolic CHF [facility]) Patient with known non-ischemic HFrEF with EF 25-30% three years ago. He is on maximum doses of Carvedilol 25 mg BID and Ramipril 10 mg BID. He is on Spironolactone 25 mg daily. He says he is  following with Dr. Sharyn Lull. He denies any symptoms of heart failure and is euvolemic on exam. Will try to obtain records from Dr. Annitta Jersey office and continue current management. Suspect he is not on lasix due to minimal symptoms and history of hypokalemia. - Continue Carvedilol 25 mg BID, Spironolactone 25 mg daily, and Ramipril 10 mg BID - Check CMET  HTN (hypertension) BP Readings from Last 3 Encounters:  03/04/17 (!) 149/102  03/19/16 128/84  03/14/16 128/77   BP initially elevated to 193/109 on arrival, however improves to 149/102 on recheck. He has room to go up on his Spironolactone if needed, however will recommend he continue current management as he has not taken his meds this morning and has not followed up consistently. - Continue Carvedilol 25 mg BID, Spironolactone 25 mg daily, and Ramipril 10 mg BID - Check CMET - f/u with PCP  History of CVA (cerebrovascular accident) Patient with reported history of CVA and CT head findings consistent with old infarcts, largest in the left occipital lobe. He is currently on DAPT with ASA 81 mg daily and Plavix 75 mg daily for unclear reasons. Given his thrombocytopenia and alcohol use, he is at risk for bleeding. We will discontinue ASA and continue Plavix. - Continue Plavix 75 mg daily - D/c ASA 81 mg daily  Tobacco abuse Patient with about 39 pack years, currently smoking 1 PPD. He is not interested in cutting back further at this time. He is counseled on smoking cessation and advised to call us for further assistance if needed. 1-800-QUIT-NOW number provided.  Alcohol abuse Patient with chronic alcohol use of 1/5th of vodka or gin daily in addition to two 24 oz cans of beer daily. He has been taking Naltrexone 50 mg daily and thinks he might have cut back some on his daily alcohol use since starting this. He is open to cutting back further.  - Counseled on alcohol cessation - Increase Naltrexone to 100 mg daily - follow up with  PCP  Preventative health care Flu shot given today. FIT testing provided for colorectal cancer screening. Checking HIV antibody today.  Need for immunization against influenza Flu shot given today.

## 2017-03-04 NOTE — Assessment & Plan Note (Signed)
Flu shot given today

## 2017-03-04 NOTE — Patient Instructions (Addendum)
It was a pleasure to see you [name] Phillips.  Please stop taking your Aspirin.  We will increase your Naltrexone to help you cut back further on your alcohol use.  Let us know if you want to cut back further on your smoking. You can call 1-800-QUIT-NOW for further assistance.  We will check blood work today and give you a stool card. We have given you the flu shot today.  Please schedule a follow up with Dr. Dimple Casey in about 3 months.

## 2017-03-04 NOTE — Assessment & Plan Note (Signed)
BP Readings from Last 3 Encounters:  03/04/17 (!) 149/102  03/19/16 128/84  03/14/16 128/77   BP initially elevated to 193/109 on arrival, however improves to 149/102 on recheck. He has room to go up on his Spironolactone if needed, however will recommend he continue current management as he has not taken his meds this morning and has not followed up consistently. - Continue Carvedilol 25 mg BID, Spironolactone 25 mg daily, and Ramipril 10 mg BID - Check CMET - f/u with PCP

## 2017-03-04 NOTE — Assessment & Plan Note (Signed)
Patient with reported history of CVA and CT head findings consistent with old infarcts, largest in the left occipital lobe. He is currently on DAPT with ASA 81 mg daily and Plavix 75 mg daily for unclear reasons. Given his thrombocytopenia and alcohol use, he is at risk for bleeding. We will discontinue ASA and continue Plavix. - Continue Plavix 75 mg daily - D/c ASA 81 mg daily

## 2017-03-04 NOTE — Assessment & Plan Note (Signed)
Patient with chronic alcohol use of 1/5th of vodka or gin daily in addition to two 24 oz cans of beer daily. He has been taking Naltrexone 50 mg daily and thinks he might have cut back some on his daily alcohol use since starting this. He is open to cutting back further.  - Counseled on alcohol cessation - Increase Naltrexone to 100 mg daily - follow up with PCP

## 2017-03-04 NOTE — Assessment & Plan Note (Signed)
Flu shot given today. FIT testing provided for colorectal cancer screening. Checking HIV antibody today.

## 2017-03-05 LAB — CMP14 + ANION GAP
ALT: 35 IU/L (ref 0–44)
AST: 40 IU/L (ref 0–40)
Albumin/Globulin Ratio: 1.7 (ref 1.2–2.2)
Albumin: 4.4 g/dL (ref 3.6–4.8)
Alkaline Phosphatase: 116 IU/L (ref 39–117)
Anion Gap: 16 mmol/L (ref 10.0–18.0)
BUN/Creatinine Ratio: 14 (ref 10–24)
BUN: 16 mg/dL (ref 8–27)
Bilirubin Total: 0.4 mg/dL (ref 0.0–1.2)
CO2: 20 mmol/L (ref 20–29)
Calcium: 9.2 mg/dL (ref 8.6–10.2)
Chloride: 103 mmol/L (ref 96–106)
Creatinine, Ser: 1.17 mg/dL (ref 0.76–1.27)
GFR calc Af Amer: 76 mL/min/{1.73_m2} (ref >59)
GFR calc non Af Amer: 66 mL/min/{1.73_m2} (ref >59)
Globulin, Total: 2.6 g/dL (ref 1.5–4.5)
Glucose: 77 mg/dL (ref 65–99)
Potassium: 4.6 mmol/L (ref 3.5–5.2)
Sodium: 139 mmol/L (ref 134–144)
Total Protein: 7 g/dL (ref 6.0–8.5)

## 2017-03-05 LAB — HIV ANTIBODY (ROUTINE TESTING W REFLEX): HIV Screen 4th Generation wRfx: NONREACTIVE

## 2017-03-05 NOTE — Progress Notes (Signed)
Internal Medicine Clinic Attending  Case discussed with Dr. Patel at the time of the visit.  We reviewed the resident's history and exam and pertinent patient test results.  I agree with the assessment, diagnosis, and plan of care documented in the resident's note.  

## 2017-03-06 ENCOUNTER — Other Ambulatory Visit: Payer: Self-pay | Admitting: Internal Medicine

## 2017-03-06 DIAGNOSIS — I10 Essential (primary) hypertension: Secondary | ICD-10-CM

## 2017-03-10 ENCOUNTER — Other Ambulatory Visit: Payer: Self-pay | Admitting: Internal Medicine

## 2017-03-28 ENCOUNTER — Ambulatory Visit (HOSPITAL_COMMUNITY)
Admission: EM | Admit: 2017-03-28 | Discharge: 2017-03-28 | Disposition: A | Discharge status: Home / Self Care | Payer: Medicare HMO | Attending: Emergency Medicine | Admitting: Emergency Medicine

## 2017-03-28 ENCOUNTER — Encounter (HOSPITAL_COMMUNITY): Payer: Self-pay | Admitting: Emergency Medicine

## 2017-03-28 DIAGNOSIS — K0889 Other specified disorders of teeth and supporting structures: Secondary | ICD-10-CM | POA: Diagnosis not present

## 2017-03-28 MED ORDER — CLINDAMYCIN HCL 150 MG PO CAPS: 450.0000 mg | ORAL_CAPSULE | Freq: Three times a day (TID) | ORAL | 0 refills | Status: AC

## 2017-03-28 NOTE — ED Provider Notes (Signed)
[facility]    CSN: [id number] Arrival date & time: 03/28/17  1849     History   Chief Complaint Chief Complaint  Patient presents with  . Dental Pain    HPI [name] Phillips is a 61 y.o. male.   Kailen presents with complaints of left lower jaw pain which started 2 days ago. He has only two teeth to this area as others have been pulled. He states he has been using "goody powder" and drinking liquor to help with his pain. This has helped. Rates his pain 9/10. States has a mild sore throat. Denies chest pain, diaphoresis, nausea vomiting, or shortness of breath. No known fevers. He is scheduled to see his dentist 11/20. He has had multiple teeth pulled and typically uses dentures. His current dentures had broken after a fall so he is awaiting new dentures. Without any known drainage from jaw or teeth. Without swelling   ROS per HPI.       Past Medical History:  Diagnosis Date  . AICD (automatic cardioverter/defibrillator) present 08/08/2011  . Alcohol abuse   . Arthritis   . CHF (congestive heart failure) (HCC)   . CVA (cerebral vascular accident) (HCC)   . Hypertension   . Nonischemic cardiomyopathy (HCC)    a. Catheterization 2005 normal coronary arteries; EF 15-20%; b. 2011 EF normal;  c. 2015 Echo: EF 25-30%;  d. s/p prior AICD with upgrade to MDT single lead ICD ser # [id number] h 2/2 ERI &12/19/2014).  . Seizure [facility])     Patient Active Problem List   Diagnosis Date Noted  . Preventative health care 08/09/2015  . ED (erectile dysfunction) of non-organic origin 05/01/2015  .   6949 Lead 11/12/2012  . Nonischemic cardiomyopathy (HCC)   . Seizure disorder (HCC) 08/08/2011  . Alcohol abuse 08/08/2011  . Tobacco abuse 08/08/2011  . HTN (hypertension) 08/08/2011  . Systolic CHF (HCC) 08/08/2011  . History of CVA (cerebrovascular accident) 08/08/2011  . AICD (automatic cardioverter/defibrillator) present 08/08/2011    Past Surgical History:  Procedure  Laterality Date  . CARDIAC DEFIBRILLATOR PLACEMENT  2007   MDT [employer] single chamber ICD implanted with [phone] lead by Dr Ty Hilts for primary prevention  . EP IMPLANTABLE DEVICE N/A 12/19/2014   Procedure:  ICD Generator Changeout;  Surgeon: Duke Salvia, MD;  Location: [facility] INVASIVE CV LAB;  Service: Cardiovascular;  Laterality: N/A;  . EP IMPLANTABLE DEVICE N/A 12/19/2014   Procedure: Lead Revision;  Surgeon: Duke Salvia, MD;  Location: [facility] INVASIVE CV LAB;  Service: Cardiovascular;  Laterality: N/A;  . ICD GENERATOR CHANGE  12/19/2014       Home Medications    Prior to Admission medications   Medication Sig Start Date End Date Taking? Authorizing Provider  ramipril (ALTACE) 10 MG capsule TAKE 1 CAPSULE BY MOUTH TWO TIMES DAILY 03/06/17  Yes Rice, Jamesetta Orleans, MD  atorvastatin (LIPITOR) 20 MG tablet Take 1 tablet (20 mg total) by mouth daily. 12/06/15   Rice, Jamesetta Orleans, MD  carvedilol (COREG) 25 MG tablet TAKE 1 TABLET BY MOUTH TWO  TIMES DAILY WITH MEALS 03/06/17   Rice, Jamesetta Orleans, MD  clindamycin (CLEOCIN) 150 MG capsule Take 3 capsules (450 mg total) by mouth every 8 (eight) hours. 03/28/17 04/07/17  Georgetta Haber, NP  clopidogrel (PLAVIX) 75 MG tablet Take 1 tablet (75 mg total) by mouth daily with breakfast. 12/06/15   Rice, Jamesetta Orleans, MD  folic acid (FOLVITE) 1 MG tablet TAKE 1 TABLET BY  MOUTH  DAILY 03/10/17   Fuller Plan, MD  naltrexone (DEPADE) 50 MG tablet Take 2 tablets (100 mg total) by mouth daily. 03/04/17   Darreld Mclean, MD  spironolactone (ALDACTONE) 25 MG tablet TAKE 1 TABLET BY MOUTH  DAILY 03/06/17   Rice, Jamesetta Orleans, MD  thiamine (VITAMIN B-1) 100 MG tablet Take 1 tablet (100 mg total) by mouth daily. 03/19/16   John Giovanni, MD    Family History Family History  Problem Relation Age of Onset  . Heart disease Father   . Thyroid cancer Mother   . Hypertension Unknown   . Diabetes type II Sister   . Seizures Brother   . Lupus Sister      Social History Social History  Substance Use Topics  . Smoking status: Current Every Day Smoker    Packs/day: 1.00    Years: 39.00    Types: Cigarettes  . Smokeless tobacco: Former Neurosurgeon    Types: Snuff, Chew  . Alcohol use 1.8 oz/week    3 Cans of beer per week     Comment: 2x/month     Allergies   Codeine; Penicillins; and Prednisone   Review of Systems Review of Systems   Physical Exam Triage Vital Signs ED Triage Vitals  Enc Vitals Group     BP 03/28/17 1906 (!) 169/112     Pulse Rate 03/28/17 1906 82     Resp 03/28/17 1906 18     Temp 03/28/17 1906 98 F (36.7 C)     Temp Source 03/28/17 1906 Oral     SpO2 03/28/17 1906 96 %     Weight --      Height --      Head Circumference --      Peak Flow --      Pain Score 03/28/17 1907 10     Pain Loc --      Pain Edu? --      Excl. in GC? --    No data found.   Updated Vital Signs BP (!) 169/112   Pulse 82   Temp 98 F (36.7 C) (Oral)   Resp 18   SpO2 96%   Visual Acuity Right Eye Distance:   Left Eye Distance:   Bilateral Distance:    Right Eye Near:   Left Eye Near:    Bilateral Near:     Physical Exam  Constitutional: He is oriented to person, place, and time. He appears well-developed and well-nourished.  HENT:  Head: Normocephalic and atraumatic.  Right Ear: External ear normal.  Left Ear: External ear normal.  Mouth/Throat: He does not have dentures. No oral lesions. Abnormal dentition. Dental caries present. No dental abscesses or uvula swelling.    Poor dentition overall, withonly two molars remaining to left lower jaw. Jaw tenderness below left lower teeth, worse to #21. Without apparent abscess present  Cardiovascular: Normal rate and regular rhythm.   Pulmonary/Chest: Effort normal and breath sounds normal.  Neurological: He is alert and oriented to person, place, and time.  Skin: Skin is warm and dry.  Vitals reviewed.    UC Treatments / Results  Labs (all labs ordered  are listed, but only abnormal results are displayed) Labs Reviewed - No data to display  EKG  EKG Interpretation None       Radiology No results found.  Procedures Procedures (including critical care time)  Medications Ordered in UC Medications - No data to display   Initial Impression / Assessment and  Plan / UC Course  I have reviewed the triage vital signs and the nursing notes.  Pertinent labs & imaging results that were available during my care of the patient were reviewed by me and considered in my medical decision making (see chart for details).     Pain is reproducible with jaw palpation at base of left lower teeth. Will initiate antibiotics at this time, to call on Monday to establish with a dentist for definitive treatment of this. Ibuprofen as needed for pain. patient states he likely will not take any pain medication. Patient verbalized understanding and agreeable to plan.   Final Clinical Impressions(s) / UC Diagnoses   Final diagnoses:  Pain, dental    New Prescriptions New Prescriptions   CLINDAMYCIN (CLEOCIN) 150 MG CAPSULE    Take 3 capsules (450 mg total) by mouth every 8 (eight) hours.     Controlled Substance Prescriptions [place] Controlled Substance Registry consulted? Not Applicable   Georgetta HaberBurky, Johnnathan Hagemeister B, NP 03/28/17 [phone]

## 2017-03-28 NOTE — ED Triage Notes (Signed)
Pt c/o tooth pain lower left jaw line x2 days.

## 2017-04-25 ENCOUNTER — Other Ambulatory Visit: Payer: Self-pay | Admitting: *Deleted

## 2017-04-25 DIAGNOSIS — I428 Other cardiomyopathies: Secondary | ICD-10-CM

## 2017-04-25 DIAGNOSIS — F1010 Alcohol abuse, uncomplicated: Secondary | ICD-10-CM

## 2017-04-25 DIAGNOSIS — I10 Essential (primary) hypertension: Secondary | ICD-10-CM

## 2017-04-25 MED ORDER — CLOPIDOGREL BISULFATE 75 MG PO TABS: 75.0000 mg | ORAL_TABLET | Freq: Every day | ORAL | 3 refills | Status: DC

## 2017-04-25 MED ORDER — SPIRONOLACTONE 25 MG PO TABS
25.0000 mg | ORAL_TABLET | Freq: Every day | ORAL | 1 refills | Status: DC
Start: 2017-04-25 — End: 2017-09-22

## 2017-04-25 MED ORDER — CARVEDILOL 25 MG PO TABS: ORAL_TABLET | ORAL | 1 refills | Status: DC

## 2017-04-25 MED ORDER — FOLIC ACID 1 MG PO TABS: 1.0000 mg | ORAL_TABLET | Freq: Every day | ORAL | 2 refills | Status: AC

## 2017-04-25 MED ORDER — RAMIPRIL 10 MG PO CAPS: ORAL_CAPSULE | ORAL | 1 refills | Status: DC

## 2017-04-25 MED ORDER — ATORVASTATIN CALCIUM 20 MG PO TABS: 20.0000 mg | ORAL_TABLET | Freq: Every day | ORAL | 3 refills | Status: DC

## 2017-04-25 MED ORDER — NALTREXONE HCL 50 MG PO TABS
100.0000 mg | ORAL_TABLET | Freq: Every day | ORAL | 1 refills | Status: DC
Start: 2017-04-25 — End: 2017-09-22

## 2017-04-29 ENCOUNTER — Telehealth: Payer: Self-pay

## 2017-04-29 NOTE — Telephone Encounter (Signed)
Rtc, did not have enough information to speak w/ brittany, spoke w/ pharmacist, gave clopidogrel script verbally as they stated they did not receive it electronically. Clindamycin was given by another provider.

## 2017-04-29 NOTE — Telephone Encounter (Signed)
[place] with [facility] pharmacy requesting to speak with a nurse about a refill on Clindamycin. Please call back.

## 2017-06-06 ENCOUNTER — Encounter: Payer: Self-pay | Admitting: Internal Medicine

## 2017-06-06 ENCOUNTER — Other Ambulatory Visit: Payer: Self-pay

## 2017-06-06 ENCOUNTER — Ambulatory Visit (INDEPENDENT_AMBULATORY_CARE_PROVIDER_SITE_OTHER): Payer: Medicare HMO | Admitting: Internal Medicine

## 2017-06-06 VITALS — BP 137/87 | HR 71 | Temp 97.4°F | Ht 75.0 in | Wt 153.5 lb

## 2017-06-06 DIAGNOSIS — R5383 Other fatigue: Secondary | ICD-10-CM | POA: Diagnosis not present

## 2017-06-06 DIAGNOSIS — Z720 Tobacco use: Secondary | ICD-10-CM

## 2017-06-06 DIAGNOSIS — R634 Abnormal weight loss: Secondary | ICD-10-CM | POA: Diagnosis not present

## 2017-06-06 DIAGNOSIS — F1010 Alcohol abuse, uncomplicated: Secondary | ICD-10-CM | POA: Diagnosis not present

## 2017-06-06 DIAGNOSIS — R197 Diarrhea, unspecified: Secondary | ICD-10-CM

## 2017-06-06 DIAGNOSIS — F17210 Nicotine dependence, cigarettes, uncomplicated: Secondary | ICD-10-CM

## 2017-06-06 DIAGNOSIS — Z79899 Other long term (current) drug therapy: Secondary | ICD-10-CM

## 2017-06-06 DIAGNOSIS — R221 Localized swelling, mass and lump, neck: Secondary | ICD-10-CM | POA: Diagnosis not present

## 2017-06-06 DIAGNOSIS — I110 Hypertensive heart disease with heart failure: Secondary | ICD-10-CM | POA: Diagnosis not present

## 2017-06-06 DIAGNOSIS — I509 Heart failure, unspecified: Secondary | ICD-10-CM | POA: Diagnosis not present

## 2017-06-06 DIAGNOSIS — I5022 Chronic systolic (congestive) heart failure: Secondary | ICD-10-CM

## 2017-06-06 DIAGNOSIS — I10 Essential (primary) hypertension: Secondary | ICD-10-CM

## 2017-06-06 DIAGNOSIS — Z681 Body mass index (BMI) 19 or less, adult: Secondary | ICD-10-CM

## 2017-06-06 MED ORDER — VITAMIN B-1 100 MG PO TABS
100.0000 mg | ORAL_TABLET | Freq: Every day | ORAL | 1 refills | Status: AC
Start: 2017-06-06 — End: ?

## 2017-06-06 NOTE — Patient Instructions (Signed)
FOLLOW-UP INSTRUCTIONS When: About 3 months For: Check up of blood pressure, nutrition, neck nodule What to bring: Medications  It was a pleasure to see you today. I do not think we need to change any medications right now.  We are checking your blood count today and make sure this has not dropped leading to you getting more short of breath.  I am worried about your low amount of food intake. This is probably making you have less energy and may be exposing you to increased risk of illnesses. If this is not getting better I would like to have you meet with our nutritionist for some recommendations as well.

## 2017-06-06 NOTE — Progress Notes (Signed)
CC: Fatigue  HPI:  [name] Phillips is a 62 y.o. male with PMHx detailed below presenting for a follow visit of his hypertension and heart failure but with a complaint of decreased energy.  See problem based assessment and plan below for additional details.  HTN (hypertension) He has continued taking his blood pressure medications without incident. He denies any episodes of leg swelling, lightheadedness or new headache. He does complain of dyspnea on exertion but he tolerates walking to a store about 1 mile each direction. His blood pressure is 137/87 today decreased from his most recent clinic encounter.  A: Blood fairly well controlled today He potentially has room to benefit from tight control given chronic systolic heart failure but symptoms are pretty optimal already Last Bmet was fine in 02/2017  P: Continue current medications: Carvedilol 25 mg BID, Spironolactone 25 mg daily, and Ramipril 10 mg BID  Alcohol abuse [name] Phillips continues to drink alcohol on a daily basis. He reports drinking beer and liquor mostly. He drinks when he can afford or someone else has on hand. Some days he has as few as 2-3 drinks but on others will drink much of the day. He is taking naltrexone 100mg  daily as directed but has not seen much more progress reducing his alcohol consumption. He is not interested in organized meetings or programs to pursue reducing drinking.  A: Alcohol use disorder, on naltrexone, precontemplative about alcohol cessation  P: I recommended he resume daily thiamine supplementation Recommended he think further about reducing drinking to improve his health  Tobacco abuse [name] Phillips continues smoking about 1 pack per day. He remains uninterested in pharmacotherapy to help in quitting smoking. He says his cardiologist has also been recommending cessation but he remains precontemplative today.  Unintentional weight loss [name] Phillips has a weight loss of about 10lbs in the  past year and closer to 20lbs over the past 2 years. He reports eating inconsistently and sometimes does not eat for 2 days at a time. This is largely due to his heavy drinking. He also notes that he does not stock foods in his home because he doesn't do any cooking for just himself anymore. He denies any GI complaints on ROS except sometimes diarrhea when he only drinks fluids and has no solid food for a day or two. He is also feeling somewhat fatigued on many days.  A: Unintentional weight loss that is most likely secondary to malnutrition related to his alcohol use. I have lower suspicion for another underlying problem at this time but he certainly has multiple risk factors for malignancy or GI disease to investigate if weight loss persists despite improving diet or if new symptoms develop.  P: -Referral to Medical nutrition therapy today -Supplementation with folate, thiamine -CBC today to exclude anemia as a cause of fatigue and low energy   Past Medical History:  Diagnosis Date  . AICD (automatic cardioverter/defibrillator) present 08/08/2011  . Alcohol abuse   . Arthritis   . CHF (congestive heart failure) (HCC)   . CVA (cerebral vascular accident) (HCC)   . Hypertension   . Nonischemic cardiomyopathy (HCC)    a. Catheterization 2005 normal coronary arteries; EF 15-20%; b. 2011 EF normal;  c. 2015 Echo: EF 25-30%;  d. s/p prior AICD with upgrade to MDT single lead ICD ser # [id number] h 2/2 ERI &12/19/2014).  . Seizure (HCC)     Review of Systems: Review of Systems  Constitutional: Positive for weight loss. Negative for chills and  fever.  Eyes: Negative for blurred vision.  Respiratory: Positive for shortness of breath. Negative for cough.   Cardiovascular: Negative for leg swelling.  Gastrointestinal: Positive for diarrhea. Negative for blood in stool, heartburn, nausea and vomiting.  Genitourinary: Negative for dysuria.  Musculoskeletal: Positive for back pain. Negative for  falls.  Skin: Negative for rash.  Neurological: Negative for sensory change.  Psychiatric/Behavioral: Positive for substance abuse.     Physical Exam: Vitals:   06/06/17 1323  BP: 137/87  Pulse: 71  Temp: (!) 97.4 F (36.3 C)  TempSrc: Oral  SpO2: 100%  Weight: 153 lb 8 oz (69.6 kg)  Height: 6\' 3"  (1.905 m)   GENERAL- alert, co-operative, NAD HEENT- Atraumatic, tongue surface is white but moist oral mucosa, firm painless ~1cm right anterior cervical lymph node CARDIAC- RRR, no murmurs, rubs or gallops. RESP- CTAB, no wheezes or crackles. ABDOMEN- Soft, nontender, no guarding or rebound, normoactive bowel sounds present NEURO- Strength upper and lower extremities- 5/5, Sensation intact globally EXTREMITIES- Symmetric, no pedal edema. PSYCH- Normal mood and affect, appropriate thought content and speech.   Assessment & Plan:   See encounters tab for problem based medical decision making.   Patient discussed with Dr. Cleda Daub

## 2017-06-07 LAB — CBC
Hematocrit: 37.9 % (ref 37.5–51.0)
Hemoglobin: 12.5 g/dL — ABNORMAL LOW (ref 13.0–17.7)
MCH: 31.7 pg (ref 26.6–33.0)
MCHC: 33 g/dL (ref 31.5–35.7)
MCV: 96 fL (ref 79–97)
Platelets: 157 10*3/uL (ref 150–379)
RBC: 3.94 x10E6/uL — ABNORMAL LOW (ref 4.14–5.80)
RDW: 16.5 % — ABNORMAL HIGH (ref 12.3–15.4)
WBC: 4.6 10*3/uL (ref 3.4–10.8)

## 2017-06-09 NOTE — Assessment & Plan Note (Addendum)
Mr. Leard has a weight loss of about 10lbs in the past year and closer to 20lbs over the past 2 years. He reports eating inconsistently and sometimes does not eat for 2 days at a time. This is largely due to his heavy drinking. He also notes that he does not stock foods in his home because he doesn't do any cooking for just himself anymore. He denies any GI complaints on ROS except sometimes diarrhea when he only drinks fluids and has no solid food for a day or two. He is also feeling somewhat fatigued on many days.  A: Unintentional weight loss that is most likely secondary to malnutrition related to his alcohol use. I have lower suspicion for another underlying problem at this time but he certainly has multiple risk factors for malignancy or GI disease to investigate if weight loss persists despite improving diet or if new symptoms develop.  P: -Referral to Medical nutrition therapy today -Supplementation with folate, thiamine -CBC today to exclude anemia as a cause of fatigue and low energy

## 2017-06-09 NOTE — Assessment & Plan Note (Signed)
Mr. Eversman continues to drink alcohol on a daily basis. He reports drinking beer and liquor mostly. He drinks when he can afford or someone else has on hand. Some days he has as few as 2-3 drinks but on others will drink much of the day. He is taking naltrexone 100mg  daily as directed but has not seen much more progress reducing his alcohol consumption. He is not interested in organized meetings or programs to pursue reducing drinking.  A: Alcohol use disorder, on naltrexone, precontemplative about alcohol cessation  P: I recommended he resume daily thiamine supplementation Recommended he think further about reducing drinking to improve his health

## 2017-06-09 NOTE — Progress Notes (Signed)
Internal Medicine Clinic Attending  Case discussed with Dr. Rice at the time of the visit.  We reviewed the resident's history and exam and pertinent patient test results.  I agree with the assessment, diagnosis, and plan of care documented in the resident's note.  

## 2017-06-09 NOTE — Assessment & Plan Note (Addendum)
He has continued taking his blood pressure medications without incident. He denies any episodes of leg swelling, lightheadedness or new headache. He does complain of dyspnea on exertion but he tolerates walking to a store about 1 mile each direction. His blood pressure is 137/87 today decreased from his most recent clinic encounter.  A: Blood fairly well controlled today He potentially has room to benefit from tight control given chronic systolic heart failure but symptoms are pretty optimal already Last Bmet was fine in 02/2017  P: Continue current medications: Carvedilol 25 mg BID, Spironolactone 25 mg daily, and Ramipril 10 mg BID

## 2017-06-09 NOTE — Assessment & Plan Note (Signed)
[name] Phillips continues smoking about 1 pack per day. He remains uninterested in pharmacotherapy to help in quitting smoking. He says his cardiologist has also been recommending cessation but he remains precontemplative today.

## 2017-06-10 ENCOUNTER — Encounter: Payer: Self-pay | Admitting: Internal Medicine

## 2017-07-01 ENCOUNTER — Encounter: Payer: Self-pay | Admitting: Dietician

## 2017-07-01 ENCOUNTER — Ambulatory Visit: Payer: Medicare HMO | Admitting: Dietician

## 2017-07-01 DIAGNOSIS — R634 Abnormal weight loss: Secondary | ICD-10-CM

## 2017-07-01 NOTE — Progress Notes (Signed)
Medical Nutrition Therapy:  Appt start time: 1325 end time:  1430. Visit # 1  Assessment:  Primary concerns today:unintentional weight loss. Documented 8.6#(5%) in past 3 months, 10% in past 9 months [name] Phillips says he has lost weight because he does not eat much food anymore and only drinks ensure when he can walk to the store and get it or get someone to bring it to him.  He lives with a "sister" who is bedridden and she eats [facility] for her meals. He reports that he does not food shop, friends, cousin and sister bring him food/beer and liquor. He says the grocery store is too far to walk.  He does not have food stamps. He does have access to a refrigerator, a stove, sink  and microwave. "I would drink a 6 pack of ensure a day if I had them" . He is interested in meal on wheels or social assistance. " I am home all the time".When asked if he is ready to abstain form alcohol is reports " I drink a lot less than I used to"  He denies difficulty chewing or swallowing.  He agrees that transportation is a problem in his weight loss.   Dentition: Dentist appointment upcoming.  Nausea: some, 1-2x/week, denies Vomiting.  Diarrhea: gets it from drinking water or milk give, stays away from them Constipation: no  BM: every 3-4 days  Preferred Learning Style: Auditory, Hands on Learning Readiness: Ready  ANTHROPOMETRICS: weight-153.1#, height- stated 6'4" measured 6'2" with shoes, BMI-19.66 WEIGHT HISTORY: 180-210#, Highest: 62# 62 years of age,  Lowest- 153#,  SLEEP:awake during day, watches tv all night, says he doesn;t sleep,   MEDICATIONS: one gummi multi daily, thinks he is supposed to take two, fish oil, has not started thiamine yet. Thinks he takes the other medicnes excpet he says coreg was stopped a while back. He did not bring his medicne with him today. He reprots that his medicne comes through the mail.  Smokes: 1/2 pack per day  Nutrition Focused Physical Exam: Subcutaneous Fat:  Orbital  Region: mild loss Upper Arm Region: mild loss Thoracic and Lumbar Region: mild loss  Muscle:  Temple Region: mild loss Clavicle Bone Region: mild loss Clavicle and Acromion Bone Region: mild loss Scapular Bone Region: mild loss Dorsal Hand: no loss Patellar Region: moderate loss Anterior Thigh Region: moderate loss Posterior Calf Region: moderate loss  Edema: no  DIETARY INTAKE: Usual eating pattern includes 0 meals and 1-2 snacks per day. Everyday foods include beer, ensure, liquor.  Avoided foods include soda and milk.   Dining Out (times/week): seldom Food Intolerances: no 24-hr recall:  Snk ( PM): vienna sausage can, 24 ounces of beer, 1/2 gallon liquor Beverages: earthquake- 24 ounce beer, 5/week, liquor vodka or gin, 1/2 gallon per day or two  Usual physical activity: ADLs  Estimated energy needs: 2000 calories 75-85 g protein   Progress Towards Goal(s):  In progress.   Nutritional Diagnosis:  NB-3.3 Limited access to nutrition-related supplies As related to  competing priorities, lack of transportation and material resources.  As evidenced by his report.   Mild protein calorie malnutrition as related to decreased food/calorie intake as evidenced by weight loss and and loss of muscle and fat stores    Intervention:  Nutrition education about getting easy to prepare foods. Will look into meal on wheels, [employer] in home support, [employer] has help with meals, they may also have help with transportation. Coordination of care: see interventions  Teaching Method  Utilized: Patent attorney, Auditory Hands on Handouts given during visit include: avs, food from our pantry, boost Barriers to learning/adherence to lifestyle change: competing values and lack of support Demonstrated degree of understanding via:  Teach Back   Monitoring/Evaluation:  Dietary intake, and body weight in 3 week(s). [name] Phillips, RD 07/01/2017 5:15 PM.

## 2017-07-01 NOTE — Patient Instructions (Signed)
  What we talked about today:   You need to eat more and drink more nourishing food and drinks in addition to the beer and liquor to keep you from losing more weight.   The plan:   Drink boost or ensure shake at least 2-4 a day.  Eat some solids food at least two times a day.    Call anytime with questions or concerns  Norm Parcel Diabetes Educator [phone]

## 2017-07-03 ENCOUNTER — Telehealth: Payer: Self-pay | Admitting: Dietician

## 2017-07-03 NOTE — Telephone Encounter (Signed)
Thanks, Lupita Leash. His problem is multifactorial so I appreciate your help discussing it with him and with the logistics of this.

## 2017-07-03 NOTE — Telephone Encounter (Signed)
Left message informing patient that 2 cases of oral supplement samples to be delivered to his address in the next two days. Will continue to work on more permanent solution for lack od access to food due to transportation and material resources.

## 2017-07-10 NOTE — Telephone Encounter (Signed)
Called [employer]. They are not accepting new clients right now. They suggested a call back in 2 months.

## 2017-07-10 NOTE — Telephone Encounter (Signed)
Called [employer] well dine number; he qualifies for post discharge 10 meals but not for his chronic condition (it is not included in his plan).

## 2017-07-22 ENCOUNTER — Ambulatory Visit: Payer: Medicare HMO | Admitting: Dietician

## 2017-07-27 ENCOUNTER — Emergency Department (HOSPITAL_COMMUNITY)
Admission: EM | Admit: 2017-07-27 | Discharge: 2017-07-27 | Disposition: A | Discharge status: Home / Self Care | Payer: Medicare HMO | Attending: Emergency Medicine | Admitting: Emergency Medicine

## 2017-07-27 ENCOUNTER — Emergency Department (HOSPITAL_COMMUNITY): Payer: Medicare HMO

## 2017-07-27 ENCOUNTER — Encounter (HOSPITAL_COMMUNITY): Payer: Self-pay | Admitting: Emergency Medicine

## 2017-07-27 DIAGNOSIS — Z95810 Presence of automatic (implantable) cardiac defibrillator: Secondary | ICD-10-CM | POA: Diagnosis not present

## 2017-07-27 DIAGNOSIS — W01198A Fall on same level from slipping, tripping and stumbling with subsequent striking against other object, initial encounter: Secondary | ICD-10-CM | POA: Insufficient documentation

## 2017-07-27 DIAGNOSIS — F17220 Nicotine dependence, chewing tobacco, uncomplicated: Secondary | ICD-10-CM | POA: Diagnosis not present

## 2017-07-27 DIAGNOSIS — F17210 Nicotine dependence, cigarettes, uncomplicated: Secondary | ICD-10-CM | POA: Diagnosis not present

## 2017-07-27 DIAGNOSIS — R52 Pain, unspecified: Secondary | ICD-10-CM

## 2017-07-27 DIAGNOSIS — Z79899 Other long term (current) drug therapy: Secondary | ICD-10-CM | POA: Diagnosis not present

## 2017-07-27 DIAGNOSIS — Y999 Unspecified external cause status: Secondary | ICD-10-CM | POA: Diagnosis not present

## 2017-07-27 DIAGNOSIS — I509 Heart failure, unspecified: Secondary | ICD-10-CM | POA: Insufficient documentation

## 2017-07-27 DIAGNOSIS — Y9389 Activity, other specified: Secondary | ICD-10-CM | POA: Insufficient documentation

## 2017-07-27 DIAGNOSIS — Z7902 Long term (current) use of antithrombotics/antiplatelets: Secondary | ICD-10-CM | POA: Diagnosis not present

## 2017-07-27 DIAGNOSIS — S2232XA Fracture of one rib, left side, initial encounter for closed fracture: Secondary | ICD-10-CM | POA: Diagnosis not present

## 2017-07-27 DIAGNOSIS — I110 Hypertensive heart disease with heart failure: Secondary | ICD-10-CM | POA: Insufficient documentation

## 2017-07-27 DIAGNOSIS — S20302A Unspecified superficial injuries of left front wall of thorax, initial encounter: Secondary | ICD-10-CM | POA: Diagnosis present

## 2017-07-27 DIAGNOSIS — Y929 Unspecified place or not applicable: Secondary | ICD-10-CM | POA: Diagnosis not present

## 2017-07-27 DIAGNOSIS — W19XXXA Unspecified fall, initial encounter: Secondary | ICD-10-CM

## 2017-07-27 MED ORDER — IBUPROFEN 400 MG PO TABS: 400.0000 mg | ORAL_TABLET | Freq: Four times a day (QID) | ORAL | 0 refills | Status: DC | PRN

## 2017-07-27 MED ORDER — ACETAMINOPHEN 325 MG PO TABS: 650.0000 mg | ORAL_TABLET | Freq: Four times a day (QID) | ORAL | 0 refills | Status: DC | PRN

## 2017-07-27 NOTE — ED Notes (Signed)
Pt able to ambulate independently 

## 2017-07-27 NOTE — ED Provider Notes (Signed)
[facility] EMERGENCY DEPARTMENT Provider Note   CSN: [id number] Arrival date & time: 07/27/17  1610     History   Chief Complaint Chief Complaint  Patient presents with  . Fall    HPI [name] Phillips is a 62 y.o. male.  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  Patient states that he was in the bathroom and lost his balance falling forward onto the toilet.  Patient states that he hit his left anterior ribs on the toilet.  States that his nephew helped him to stand up after this, and he was able to walk.  He denies feeling short of breath, chest pain, dizziness, lightheadedness, or near syncope prior to the fall.  States that his legs gave out under him.  Denies any other injuries.  States he did not his head or lose consciousness.  Denies any neck or back pain.  Denies any pain to the bilateral upper or lower extremities.  Denies any weakness, numbness to the bilateral upper or lower extremities.  Denies chest pain or shortness of breath currently.  Denies headaches or vision changes.  Patient endorses EtOH use prior to arrival, stating that he drank "a few shots". States he has a h/o seizures, but has not had one in 4 years and does not believe he had one today as he remembers the entire episode. He had no post ictal state and was able to get up with the aid of his nephew after the incident. Denies tongue biting or urinary incontinence.  Past Medical History:  Diagnosis Date  . AICD (automatic cardioverter/defibrillator) present 08/08/2011  . Alcohol abuse   . Arthritis   . CHF (congestive heart failure) (HCC)   . CVA (cerebral vascular accident) (HCC)   . Hypertension   . Nonischemic cardiomyopathy (HCC)    a. Catheterization 2005 normal coronary arteries; EF 15-20%; b. 2011 EF normal;  c. 2015 Echo: EF 25-30%;   d. s/p prior AICD with upgrade to MDT single lead ICD ser # [id number] h 2/2 ERI &12/19/2014).  . Seizure [facility])     Patient Active Problem List   Diagnosis Date Noted  . Unintentional weight loss 06/06/2017  . Preventative health care 08/09/2015  . ED (erectile dysfunction) of non-organic origin 05/01/2015  .   6949 Lead 11/12/2012  . Nonischemic cardiomyopathy (HCC)   . Seizure disorder (HCC) 08/08/2011  . Alcohol abuse 08/08/2011  . Tobacco abuse 08/08/2011  . HTN (hypertension) 08/08/2011  . Systolic CHF (HCC) 08/08/2011  . History of CVA (cerebrovascular accident) 08/08/2011  . AICD (automatic cardioverter/defibrillator) present 08/08/2011    Past Surgical History:  Procedure Laterality Date  . CARDIAC DEFIBRILLATOR PLACEMENT  2007   MDT [employer] single chamber ICD implanted with [phone] lead by Dr Ty Hilts for primary prevention  . EP IMPLANTABLE DEVICE N/A 12/19/2014   Procedure:  ICD Generator Changeout;  Surgeon: Duke Salvia, MD;  Location: [facility] INVASIVE CV LAB;  Service: Cardiovascular;  Laterality: N/A;  . EP IMPLANTABLE DEVICE N/A 12/19/2014   Procedure: Lead Revision;  Surgeon: Duke Salvia, MD;  Location: [facility] INVASIVE CV LAB;  Service: Cardiovascular;  Laterality: N/A;  . ICD GENERATOR CHANGE  12/19/2014       Home Medications    Prior to Admission medications   Medication Sig Start Date End Date Taking? Authorizing Provider  acetaminophen (  TYLENOL) 325 MG tablet Take 2 tablets (650 mg total) by mouth every 6 (six) hours as needed. Do not take more than 4000mg  of tylenol per day 07/27/17   Kais Monje S, PA-C  atorvastatin (LIPITOR) 20 MG tablet Take 1 tablet (20 mg total) by mouth daily. 04/25/17   Rice, Jamesetta Orleans, MD  carvedilol (COREG) 25 MG tablet TAKE 1 TABLET BY MOUTH TWO  TIMES DAILY WITH MEALS 04/25/17   Rice, Jamesetta Orleans, MD  clopidogrel (PLAVIX) 75 MG tablet Take 1 tablet (75 mg total) by mouth daily with breakfast. 04/25/17   Rice, Jamesetta Orleans, MD    folic acid (FOLVITE) 1 MG tablet Take 1 tablet (1 mg total) by mouth daily. 04/25/17   Rice, Jamesetta Orleans, MD  ibuprofen (ADVIL,MOTRIN) 400 MG tablet Take 1 tablet (400 mg total) by mouth every 6 (six) hours as needed. 07/27/17   Titus Drone S, PA-C  naltrexone (DEPADE) 50 MG tablet Take 2 tablets (100 mg total) by mouth daily. 04/25/17   Rice, Jamesetta Orleans, MD  ramipril (ALTACE) 10 MG capsule TAKE 1 CAPSULE BY MOUTH TWO TIMES DAILY 04/25/17   Rice, Jamesetta Orleans, MD  spironolactone (ALDACTONE) 25 MG tablet Take 1 tablet (25 mg total) by mouth daily. 04/25/17   Rice, Jamesetta Orleans, MD  thiamine (VITAMIN B-1) 100 MG tablet Take 1 tablet (100 mg total) by mouth daily. 06/06/17   Fuller Plan, MD    Family History Family History  Problem Relation Age of Onset  . Heart disease Father   . Thyroid cancer Mother   . Hypertension Unknown   . Diabetes type II Sister   . Seizures Brother   . Lupus Sister     Social History Social History   Tobacco Use  . Smoking status: Current Every Day Smoker    Packs/day: 1.00    Years: 39.00    Pack years: 39.00    Types: Cigarettes  . Smokeless tobacco: Former Neurosurgeon    Types: Snuff, Chew  Substance Use Topics  . Alcohol use: Yes    Alcohol/week: 1.8 oz    Types: 3 Cans of beer per week    Comment: 2x/month  . Drug use: No    Comment: Former crack cocaine use for several years, last 9 years ago     Allergies   Codeine; Penicillins; and Prednisone   Review of Systems Review of Systems  Constitutional: Negative for fever.  HENT: Negative for ear pain.   Eyes: Negative for pain and visual disturbance.  Respiratory: Negative for shortness of breath.   Cardiovascular: Negative for chest pain.       Left rip pain  Gastrointestinal: Negative for abdominal pain.  Genitourinary: Negative for flank pain.  Musculoskeletal: Negative for back pain, neck pain and neck stiffness.  Skin: Negative for rash.  Neurological: Negative for  dizziness, syncope, weakness, light-headedness, numbness and headaches.       No head trauma or LOC  All other systems reviewed and are negative.    Physical Exam Updated Vital Signs BP 128/88 (BP Location: Right Arm)   Pulse 92   Temp 98.3 F (36.8 C) (Oral)   Resp 18   Ht 6' (1.829 m)   Wt 70.3 kg (155 lb)   SpO2 98%   BMI 21.02 kg/m   Physical Exam  Constitutional: He appears well-developed and well-nourished.  No acute distress.  Nontoxic-appearing. patient smells of EtOH.  HENT:  Head: Normocephalic and atraumatic.  Right Ear: External ear  normal.  Left Ear: External ear normal.  Nose: Nose normal.  Mouth/Throat: Oropharynx is clear and moist.  No battle signs, no raccoons eyes, no rhinorrhea. No tenderness to palpation of the skull or face. No deformity or crepitus noted.  Eyes: Conjunctivae and EOM are normal. Pupils are equal, round, and reactive to light.  Neck: Normal range of motion. Neck supple. No tracheal deviation present.  C-spine tenderness.  Able to flex neck to 45 degrees bilaterally without pain.  Cardiovascular: Normal rate, regular rhythm, normal heart sounds and intact distal pulses.  No murmur heard. Pulmonary/Chest: Effort normal and breath sounds normal. No stridor. No respiratory distress. He has no wheezes.  Patient has tenderness and swelling to the left lower anterior ribs with tenderness to palpation.  No overlying erythema or ecchymosis.  Abdominal: Soft. Bowel sounds are normal. He exhibits no distension. There is no tenderness. There is no guarding.  Musculoskeletal: He exhibits no edema.  Neurological: He is alert.  Patient alert and oriented to self, situation, place, knows the year, but not the month. Mental Status:  Alert, thought content appropriate, able to give history however speaks slowly. No slurred speech or evidence of aphasia. Able to follow 2 step commands without difficulty.  Cranial Nerves:  II:  Peripheral visual fields  grossly normal, pupils equal, round, reactive to light III,IV, VI: ptosis not present, extra-ocular motions intact bilaterally  V,VII: smile symmetric, facial light touch sensation equal VIII: hearing grossly normal to voice  X: uvula elevates symmetrically  XI: bilateral shoulder shrug symmetric and strong XII: midline tongue extension without fassiculations Motor:  Normal tone. 5/5 strength of BUE and RLE major muscle groups including strong and equal grip strength and dorsiflexion/plantar flexion, left lower extremity with 4/5 strength throughout (chronic and unchanged per patient close) Sensory: light touch normal in all extremities. CV: 2+ radial and DP/PT pulses   Skin: Skin is warm and dry. Capillary refill takes less than 2 seconds.  Psychiatric: He has a normal mood and affect.  Nursing note and vitals reviewed.    ED Treatments / Results  Labs (all labs ordered are listed, but only abnormal results are displayed) Labs Reviewed - No data to display  EKG  EKG Interpretation None     ECG with NSR, HR 69. Normal pr interval. Normal qt/qtc. No ischemic changes.  Radiology Dg Chest 2 View  Result Date: 07/27/2017 CLINICAL DATA:  Patient with dizziness status post fall. Left chest pain. EXAM: CHEST  2 VIEW COMPARISON:  Chest radiograph 10/29/2015. FINDINGS: Multi lead AICD device overlies the right hemithorax, leads are stable in position. Stable cardiac and mediastinal contours. No consolidative pulmonary opacities. No pleural effusion or pneumothorax. Thoracic spine degenerative changes. IMPRESSION: No active cardiopulmonary disease. Electronically Signed   By: Annia Belt M.D.   On: 07/27/2017 17:54   Dg Ribs Unilateral Left  Result Date: 07/27/2017 CLINICAL DATA:  Patient with left rib pain status post fall. EXAM: LEFT RIBS - 2 VIEW COMPARISON:  Chest radiograph earlier same day FINDINGS: Metallic marker overlies the site of tenderness. There is a nondisplaced fracture  through the left lateral seventh rib underlying the palpable marker. IMPRESSION: Nondisplaced left lateral seventh rib fracture underlying the palpable marker. Electronically Signed   By: Annia Belt M.D.   On: 07/27/2017 18:21    Procedures Procedures (including critical care time)  Medications Ordered in ED Medications - No data to display   Initial Impression / Assessment and Plan / ED Course  I  have reviewed the triage vital signs and the nursing notes.  Pertinent labs & imaging results that were available during my care of the patient were reviewed by me and considered in my medical decision making (see chart for details).    Final Clinical Impressions(s) / ED Diagnoses   Final diagnoses:  Fall, initial encounter  Closed fracture of one rib of left side, initial encounter   62 year old male presenting with left-sided rib pain status post mechanical fall that occurred earlier today.  Vital signs stable here.  ECG NSR with no ischemic changes. Pt alert and oriented. With normal neurological exam. Patient without signs of serious head, neck, or back injury. No midline spinal tenderness or TTP of the abd.  No concern for closed head injury, lung injury, or intraabdominal injury. does C/o left lower rib pain with induration to ribs. No crepitus noted. XR of left ribs showed nondisplaced left lateral seventh rib fracture. Pt with good tidal volume,  Patient with good tidal volume, no hemoptysis, no decreased breath sounds and no pneumothorax on x-ray. Patient given definitive rib structures including use of incentive spirometer 10 times per hour to prevent pneumonia; use of pillow when coughing and taking NSAIDs along with pain medications. Patient also advised to followup with primary care if not improved in one week.  I have also discussed reasons to return immediately to the ER including difficulty breathing, hemoptysis.  Patient expresses understanding and agrees with plan.  ED Discharge  Orders        Ordered    acetaminophen (TYLENOL) 325 MG tablet  Every 6 hours PRN     07/27/17 2027    ibuprofen (ADVIL,MOTRIN) 400 MG tablet  Every 6 hours PRN     07/27/17 2027       Karrie Meres, PA-C 07/28/17 0036    Rolland Porter, MD 07/28/17 [phone]

## 2017-07-27 NOTE — ED Notes (Signed)
Pt back to x ray

## 2017-07-27 NOTE — ED Triage Notes (Signed)
Per EMS pt from home was was in bathroom became dizzy and fell, crepitus on ribs L side 4-5, pain 4/10, ETOH a couple shots of scotch, no LOC, denies head, neck or back pain

## 2017-07-27 NOTE — Discharge Instructions (Addendum)
You were given an incentive spirometer, which will help you take deep breaths.  You need to use this device 10 times each hour while you are awake. You may alternate taking Tylenol and Ibuprofen as needed for pain control. You may take 400-600 mg of ibuprofen every 6 hours and [phone] mg of Tylenol every 6 hours. Do not exceed 4000 mg of Tylenol daily as this can lead to liver damage. Also, make sure to take Ibuprofen with meals as it can cause an upset stomach. Do not take other NSAIDs while taking Ibuprofen such as (Aleve, Naprosyn, Aspirin, Celebrex, etc) and do not take more than the prescribed dose as this can lead to ulcers and bleeding in your GI tract. You may use warm and cold compresses to help with your symptoms.   Please follow up with your primary doctor within the next 7-10 days for re-evaluation and further treatment of your symptoms.  He did not have a primary care doctor you are given information for the [facility] health and wellness clinic, who you will need to make an appointment with to follow-up to make sure that your symptoms are resolving.  Please return to the ER sooner if you have any new or worsening symptoms including any chest pain, difficulty breathing, worsening pain, or any new or worsening symptoms.

## 2017-08-03 ENCOUNTER — Emergency Department (HOSPITAL_COMMUNITY): Payer: Medicare HMO

## 2017-08-03 ENCOUNTER — Inpatient Hospital Stay (HOSPITAL_COMMUNITY)
Admission: EM | Admit: 2017-08-03 | Discharge: 2017-08-07 | DRG: 378 | Disposition: A | Discharge status: Home / Self Care | Payer: Medicare HMO | Attending: Internal Medicine | Admitting: Internal Medicine

## 2017-08-03 ENCOUNTER — Other Ambulatory Visit: Payer: Self-pay

## 2017-08-03 ENCOUNTER — Encounter (HOSPITAL_COMMUNITY): Payer: Self-pay | Admitting: Emergency Medicine

## 2017-08-03 DIAGNOSIS — D62 Acute posthemorrhagic anemia: Secondary | ICD-10-CM | POA: Diagnosis present

## 2017-08-03 DIAGNOSIS — Z79899 Other long term (current) drug therapy: Secondary | ICD-10-CM

## 2017-08-03 DIAGNOSIS — I5022 Chronic systolic (congestive) heart failure: Secondary | ICD-10-CM | POA: Diagnosis present

## 2017-08-03 DIAGNOSIS — I110 Hypertensive heart disease with heart failure: Secondary | ICD-10-CM | POA: Diagnosis present

## 2017-08-03 DIAGNOSIS — Z95810 Presence of automatic (implantable) cardiac defibrillator: Secondary | ICD-10-CM

## 2017-08-03 DIAGNOSIS — F1010 Alcohol abuse, uncomplicated: Secondary | ICD-10-CM | POA: Diagnosis present

## 2017-08-03 DIAGNOSIS — D696 Thrombocytopenia, unspecified: Secondary | ICD-10-CM | POA: Diagnosis present

## 2017-08-03 DIAGNOSIS — I10 Essential (primary) hypertension: Secondary | ICD-10-CM | POA: Diagnosis present

## 2017-08-03 DIAGNOSIS — K922 Gastrointestinal hemorrhage, unspecified: Secondary | ICD-10-CM | POA: Diagnosis present

## 2017-08-03 DIAGNOSIS — K449 Diaphragmatic hernia without obstruction or gangrene: Secondary | ICD-10-CM | POA: Diagnosis present

## 2017-08-03 DIAGNOSIS — Z8673 Personal history of transient ischemic attack (TIA), and cerebral infarction without residual deficits: Secondary | ICD-10-CM

## 2017-08-03 DIAGNOSIS — K254 Chronic or unspecified gastric ulcer with hemorrhage: Secondary | ICD-10-CM | POA: Diagnosis not present

## 2017-08-03 DIAGNOSIS — I5020 Unspecified systolic (congestive) heart failure: Secondary | ICD-10-CM | POA: Diagnosis present

## 2017-08-03 DIAGNOSIS — I428 Other cardiomyopathies: Secondary | ICD-10-CM

## 2017-08-03 DIAGNOSIS — Z7902 Long term (current) use of antithrombotics/antiplatelets: Secondary | ICD-10-CM

## 2017-08-03 DIAGNOSIS — F17210 Nicotine dependence, cigarettes, uncomplicated: Secondary | ICD-10-CM | POA: Diagnosis present

## 2017-08-03 DIAGNOSIS — N179 Acute kidney failure, unspecified: Secondary | ICD-10-CM | POA: Diagnosis present

## 2017-08-03 DIAGNOSIS — K253 Acute gastric ulcer without hemorrhage or perforation: Secondary | ICD-10-CM

## 2017-08-03 DIAGNOSIS — K769 Liver disease, unspecified: Secondary | ICD-10-CM

## 2017-08-03 MED ORDER — SODIUM CHLORIDE 0.9 % IV BOLUS (SEPSIS)
500.0000 mL | Freq: Once | INTRAVENOUS | Status: AC
  Administered 2017-08-04: 500 mL via INTRAVENOUS

## 2017-08-03 NOTE — ED Triage Notes (Signed)
Pt BIB EMS due to having coffee ground emesis around 2230 tonight. Pt denies nausea or abdominal pain. Abdomen distended. Pt has recently been feeling weak. Pt has been on a blood thinner in the past - not currently on one. Hypotensive with EMS [phone]

## 2017-08-03 NOTE — ED Provider Notes (Signed)
[facility] EMERGENCY DEPARTMENT Provider Note   CSN: [id number] Arrival date & time: 08/03/17  2304     History   Chief Complaint Chief Complaint  Patient presents with  . Emesis    HPI [name] Phillips is a 62 y.o. male.  Patient presents to the emergency department for evaluation of hematemesis.  Patient reports that he had one episode of vomiting dark blood just prior to coming to the ER.  He comes by EMS.  Patient denies any abdominal pain.  He does have left-sided rib pain, 3/10, from a recent fall resulting in rib fractures.  He has been taking ibuprofen every 6 hours for the rib pain.      Past Medical History:  Diagnosis Date  . AICD (automatic cardioverter/defibrillator) present 08/08/2011  . Alcohol abuse   . Arthritis   . CHF (congestive heart failure) (HCC)   . CVA (cerebral vascular accident) (HCC)   . Hypertension   . Nonischemic cardiomyopathy (HCC)    a. Catheterization 2005 normal coronary arteries; EF 15-20%; b. 2011 EF normal;  c. 2015 Echo: EF 25-30%;  d. s/p prior AICD with upgrade to MDT single lead ICD ser # [id number] h 2/2 ERI &12/19/2014).  . Seizure [facility])     Patient Active Problem List   Diagnosis Date Noted  . Unintentional weight loss 06/06/2017  . Preventative health care 08/09/2015  . ED (erectile dysfunction) of non-organic origin 05/01/2015  .   6949 Lead 11/12/2012  . Nonischemic cardiomyopathy (HCC)   . Seizure disorder (HCC) 08/08/2011  . Alcohol abuse 08/08/2011  . Tobacco abuse 08/08/2011  . HTN (hypertension) 08/08/2011  . Systolic CHF (HCC) 08/08/2011  . History of CVA (cerebrovascular accident) 08/08/2011  . AICD (automatic cardioverter/defibrillator) present 08/08/2011    Past Surgical History:  Procedure Laterality Date  . CARDIAC DEFIBRILLATOR PLACEMENT  2007   MDT [employer] single chamber ICD implanted with [id number] lead by Dr Ty Hilts for primary prevention  . EP IMPLANTABLE DEVICE N/A 12/19/2014   Procedure:  ICD Generator Changeout;  Surgeon: Duke Salvia, MD;  Location: [facility] INVASIVE CV LAB;  Service: Cardiovascular;  Laterality: N/A;  . EP IMPLANTABLE DEVICE N/A 12/19/2014   Procedure: Lead Revision;  Surgeon: Duke Salvia, MD;  Location: [facility] INVASIVE CV LAB;  Service: Cardiovascular;  Laterality: N/A;  . ICD GENERATOR CHANGE  12/19/2014       Home Medications    Prior to Admission medications   Medication Sig Start Date End Date Taking? Authorizing Provider  acetaminophen (TYLENOL) 325 MG tablet Take 2 tablets (650 mg total) by mouth every 6 (six) hours as needed. Do not take more than 4000mg  of tylenol per day 07/27/17  Yes Couture, Cortni S, PA-C  atorvastatin (LIPITOR) 20 MG tablet Take 1 tablet (20 mg total) by mouth daily. 04/25/17  Yes Rice, Jamesetta Orleans, MD  carvedilol (COREG) 25 MG tablet TAKE 1 TABLET BY MOUTH TWO  TIMES DAILY WITH MEALS 04/25/17  Yes Rice, Jamesetta Orleans, MD  clopidogrel (PLAVIX) 75 MG tablet Take 1 tablet (75 mg total) by mouth daily with breakfast. 04/25/17  Yes Rice, Jamesetta Orleans, MD  ibuprofen (ADVIL,MOTRIN) 400 MG tablet Take 1 tablet (400 mg total) by mouth every 6 (six) hours as needed. 07/27/17  Yes Couture, Cortni S, PA-C  Multiple Vitamin (MULTIVITAMIN WITH MINERALS) TABS tablet Take 1 tablet by mouth daily.   Yes [provider]  naltrexone (DEPADE) 50 MG tablet Take 2 tablets (100 mg total) by mouth  daily. 04/25/17  Yes Rice, Jamesetta Orleans, MD  ramipril (ALTACE) 10 MG capsule TAKE 1 CAPSULE BY MOUTH TWO TIMES DAILY 04/25/17  Yes Rice, Jamesetta Orleans, MD  spironolactone (ALDACTONE) 25 MG tablet Take 1 tablet (25 mg total) by mouth daily. 04/25/17  Yes Rice, Jamesetta Orleans, MD  folic acid (FOLVITE) 1 MG tablet Take 1 tablet (1 mg total) by mouth daily. Patient not taking: Reported on 08/04/2017 04/25/17   Fuller Plan, MD  thiamine (VITAMIN B-1) 100 MG tablet Take 1 tablet (100 mg total) by mouth daily. Patient not taking: Reported on  08/04/2017 06/06/17   Fuller Plan, MD    Family History Family History  Problem Relation Age of Onset  . Heart disease Father   . Thyroid cancer Mother   . Hypertension Unknown   . Diabetes type II Sister   . Seizures Brother   . Lupus Sister     Social History Social History   Tobacco Use  . Smoking status: Current Every Day Smoker    Packs/day: 1.00    Years: 39.00    Pack years: 39.00    Types: Cigarettes  . Smokeless tobacco: Former Neurosurgeon    Types: Snuff, Chew  Substance Use Topics  . Alcohol use: Yes    Alcohol/week: 1.8 oz    Types: 3 Cans of beer per week    Comment: 2x/month  . Drug use: No    Comment: Former crack cocaine use for several years, last 9 years ago     Allergies   Codeine; Penicillins; and Prednisone   Review of Systems Review of Systems  Gastrointestinal: Positive for nausea and vomiting.  All other systems reviewed and are negative.    Physical Exam Updated Vital Signs BP (!) 89/65   Pulse 88   Temp 98.4 F (36.9 C) (Oral)   Resp 12   Ht 6\' 2"  (1.88 m)   Wt 68 kg (150 lb)   SpO2 97%   BMI 19.26 kg/m   Physical Exam  Constitutional: He is oriented to person, place, and time. He appears well-developed and well-nourished. No distress.  HENT:  Head: Normocephalic and atraumatic.  Right Ear: Hearing normal.  Left Ear: Hearing normal.  Nose: Nose normal.  Mouth/Throat: Oropharynx is clear and moist and mucous membranes are normal.  Eyes: Conjunctivae and EOM are normal. Pupils are equal, round, and reactive to light.  Neck: Normal range of motion. Neck supple.  Cardiovascular: Regular rhythm, S1 normal and S2 normal. Exam reveals no gallop and no friction rub.  No murmur heard. Pulmonary/Chest: Effort normal and breath sounds normal. No respiratory distress. He exhibits no tenderness.  Abdominal: Soft. Normal appearance and bowel sounds are normal. There is no hepatosplenomegaly. There is no tenderness. There is no  rebound, no guarding, no tenderness at McBurney's point and negative Murphy's sign. No hernia.  Musculoskeletal: Normal range of motion.  Neurological: He is alert and oriented to person, place, and time. He has normal strength. No cranial nerve deficit or sensory deficit. Coordination normal. GCS eye subscore is 4. GCS verbal subscore is 5. GCS motor subscore is 6.  Skin: Skin is warm, dry and intact. No rash noted. No cyanosis.  Psychiatric: He has a normal mood and affect. His speech is normal and behavior is normal. Thought content normal.  Nursing note and vitals reviewed.    ED Treatments / Results  Labs (all labs ordered are listed, but only abnormal results are displayed) Labs Reviewed  CBC  WITH DIFFERENTIAL/PLATELET - Abnormal; Notable for the following components:      Result Value   RBC 2.72 (*)    Hemoglobin 8.6 (*)    HCT 25.9 (*)    Platelets 139 (*)    All other components within normal limits  COMPREHENSIVE METABOLIC PANEL - Abnormal; Notable for the following components:   Glucose, Bld 109 (*)    BUN 71 (*)    Creatinine, Ser 1.68 (*)    Total Protein 5.4 (*)    Albumin 2.9 (*)    GFR calc non Af Amer 41 (*)    GFR calc Af Amer 48 (*)    All other components within normal limits  LIPASE, BLOOD  ETHANOL  PROTIME-INR  POC OCCULT BLOOD, ED  TYPE AND SCREEN  PREPARE RBC (CROSSMATCH)  ABO/RH    EKG  EKG Interpretation None       Radiology Dg Abd Acute W/chest  Result Date: 08/03/2017 CLINICAL DATA:  Hematemesis for 1 hour EXAM: DG ABDOMEN ACUTE W/ 1V CHEST COMPARISON:  07/27/2017 FINDINGS: Cardiac shadow is within normal limits. Defibrillator is again noted and stable. The lungs are well aerated bilaterally without focal infiltrate. Scattered large and small bowel gas is noted. Mild retained fecal material is seen. No free air is noted. No abnormal mass or abnormal calcifications are noted. Degenerative changes of the lumbar spine are seen. IMPRESSION: No  acute abnormality noted. Electronically Signed   By: Alcide Clever M.D.   On: 08/03/2017 23:50    Procedures Procedures (including critical care time)  Medications Ordered in ED Medications  pantoprazole (PROTONIX) 80 mg in sodium chloride 0.9 % 100 mL IVPB (not administered)  pantoprazole (PROTONIX) 80 mg in sodium chloride 0.9 % 250 mL (0.32 mg/mL) infusion (not administered)  pantoprazole (PROTONIX) injection 40 mg (not administered)  octreotide (SANDOSTATIN) 2 mcg/mL load via infusion 50 mcg (not administered)    And  octreotide (SANDOSTATIN) 500 mcg in sodium chloride 0.9 % 250 mL (2 mcg/mL) infusion (not administered)  cefTRIAXone (ROCEPHIN) 1 g in sodium chloride 0.9 % 100 mL IVPB (not administered)  sodium chloride 0.9 % bolus 500 mL (0 mLs Intravenous Stopped 08/04/17 0113)     Initial Impression / Assessment and Plan / ED Course  I have reviewed the triage vital signs and the nursing notes.  Pertinent labs & imaging results that were available during my care of the patient were reviewed by me and considered in my medical decision making (see chart for details).     Patient presents to the emergency department for evaluation of coffee-ground emesis.  Patient is a known alcoholic but does not have a history of cirrhosis that we know of.  Patient mildly hypotensive, blood pressures in the 90s systolic.  It looks like he is normally normotensive or slightly hypertensive.  Patient's hemoglobin is 8.6, was 12.52 months ago.  He does have mildly low platelets but no significant auto anticoagulation, INR is 1.17.  Patient's BUN and creatinine are elevated, but BUN is out of proportion to the creatinine.  This is consistent with upper GI bleed.  Patient initiated empirically on Protonix bolus and drip, octreotide bolus and drip.  He was given empiric Rocephin.  I am concerned about the possibility of varices, although patient has been taking ibuprofen every 6 hours because of recent rib  fractures.  Discussed briefly with Dr. Bosie Clos, on-call for gastroenterology.  Agrees with treatment plan and will see patient in a.m.  Patient's blood pressure has  been persistently low despite fluid bolus, will initiate transfusion of 1 unit packed red blood cell and admit to internal medicine teaching service.  CRITICAL CARE Performed by: Gilda Crease   Total critical care time: 35 minutes  Critical care time was exclusive of separately billable procedures and treating other patients.  Critical care was necessary to treat or prevent imminent or life-threatening deterioration.  Critical care was time spent personally by me on the following activities: development of treatment plan with patient and/or surrogate as well as nursing, discussions with consultants, evaluation of patient's response to treatment, examination of patient, obtaining history from patient or surrogate, ordering and performing treatments and interventions, ordering and review of laboratory studies, ordering and review of radiographic studies, pulse oximetry and re-evaluation of patient's condition.   Final Clinical Impressions(s) / ED Diagnoses   Final diagnoses:  Upper GI bleed    ED Discharge Orders    None       Gilda Crease, MD 08/04/17 [phone]

## 2017-08-03 NOTE — ED Notes (Signed)
Patient transported to X-ray 

## 2017-08-04 ENCOUNTER — Encounter (HOSPITAL_COMMUNITY): Payer: Self-pay | Admitting: Emergency Medicine

## 2017-08-04 DIAGNOSIS — I5022 Chronic systolic (congestive) heart failure: Secondary | ICD-10-CM

## 2017-08-04 DIAGNOSIS — K449 Diaphragmatic hernia without obstruction or gangrene: Secondary | ICD-10-CM | POA: Diagnosis not present

## 2017-08-04 DIAGNOSIS — Z95810 Presence of automatic (implantable) cardiac defibrillator: Secondary | ICD-10-CM | POA: Diagnosis not present

## 2017-08-04 DIAGNOSIS — K259 Gastric ulcer, unspecified as acute or chronic, without hemorrhage or perforation: Secondary | ICD-10-CM | POA: Diagnosis not present

## 2017-08-04 DIAGNOSIS — F1099 Alcohol use, unspecified with unspecified alcohol-induced disorder: Secondary | ICD-10-CM | POA: Diagnosis not present

## 2017-08-04 DIAGNOSIS — K922 Gastrointestinal hemorrhage, unspecified: Secondary | ICD-10-CM | POA: Diagnosis present

## 2017-08-04 DIAGNOSIS — K920 Hematemesis: Secondary | ICD-10-CM

## 2017-08-04 DIAGNOSIS — F17210 Nicotine dependence, cigarettes, uncomplicated: Secondary | ICD-10-CM | POA: Diagnosis not present

## 2017-08-04 DIAGNOSIS — N179 Acute kidney failure, unspecified: Secondary | ICD-10-CM | POA: Diagnosis not present

## 2017-08-04 DIAGNOSIS — I5020 Unspecified systolic (congestive) heart failure: Secondary | ICD-10-CM | POA: Diagnosis not present

## 2017-08-04 DIAGNOSIS — K254 Chronic or unspecified gastric ulcer with hemorrhage: Secondary | ICD-10-CM | POA: Diagnosis not present

## 2017-08-04 DIAGNOSIS — Z79899 Other long term (current) drug therapy: Secondary | ICD-10-CM

## 2017-08-04 DIAGNOSIS — Z7902 Long term (current) use of antithrombotics/antiplatelets: Secondary | ICD-10-CM | POA: Diagnosis not present

## 2017-08-04 DIAGNOSIS — Z885 Allergy status to narcotic agent status: Secondary | ICD-10-CM

## 2017-08-04 DIAGNOSIS — W1812XD Fall from or off toilet with subsequent striking against object, subsequent encounter: Secondary | ICD-10-CM

## 2017-08-04 DIAGNOSIS — Z880 Allergy status to penicillin: Secondary | ICD-10-CM

## 2017-08-04 DIAGNOSIS — I110 Hypertensive heart disease with heart failure: Secondary | ICD-10-CM

## 2017-08-04 DIAGNOSIS — S2232XD Fracture of one rib, left side, subsequent encounter for fracture with routine healing: Secondary | ICD-10-CM

## 2017-08-04 DIAGNOSIS — Z8673 Personal history of transient ischemic attack (TIA), and cerebral infarction without residual deficits: Secondary | ICD-10-CM

## 2017-08-04 DIAGNOSIS — Z791 Long term (current) use of non-steroidal anti-inflammatories (NSAID): Secondary | ICD-10-CM | POA: Diagnosis not present

## 2017-08-04 DIAGNOSIS — X58XXXD Exposure to other specified factors, subsequent encounter: Secondary | ICD-10-CM | POA: Diagnosis not present

## 2017-08-04 DIAGNOSIS — Z888 Allergy status to other drugs, medicaments and biological substances status: Secondary | ICD-10-CM

## 2017-08-04 DIAGNOSIS — D696 Thrombocytopenia, unspecified: Secondary | ICD-10-CM | POA: Diagnosis not present

## 2017-08-04 DIAGNOSIS — Z9889 Other specified postprocedural states: Secondary | ICD-10-CM | POA: Diagnosis not present

## 2017-08-04 DIAGNOSIS — D62 Acute posthemorrhagic anemia: Secondary | ICD-10-CM | POA: Diagnosis not present

## 2017-08-04 DIAGNOSIS — Z800 Family history of malignant neoplasm of digestive organs: Secondary | ICD-10-CM

## 2017-08-04 DIAGNOSIS — I428 Other cardiomyopathies: Secondary | ICD-10-CM | POA: Diagnosis not present

## 2017-08-04 DIAGNOSIS — F1010 Alcohol abuse, uncomplicated: Secondary | ICD-10-CM | POA: Diagnosis not present

## 2017-08-04 DIAGNOSIS — K2971 Gastritis, unspecified, with bleeding: Secondary | ICD-10-CM | POA: Diagnosis not present

## 2017-08-04 LAB — PREPARE RBC (CROSSMATCH)

## 2017-08-04 LAB — COMPREHENSIVE METABOLIC PANEL
ALT: 18 U/L (ref 17–63)
AST: 22 U/L (ref 15–41)
Albumin: 2.9 g/dL — ABNORMAL LOW (ref 3.5–5.0)
Alkaline Phosphatase: 62 U/L (ref 38–126)
Anion gap: 14 (ref 5–15)
BUN: 71 mg/dL — ABNORMAL HIGH (ref 6–20)
CO2: 23 mmol/L (ref 22–32)
Calcium: 9.2 mg/dL (ref 8.9–10.3)
Chloride: 102 mmol/L (ref 101–111)
Creatinine, Ser: 1.68 mg/dL — ABNORMAL HIGH (ref 0.61–1.24)
GFR calc Af Amer: 48 mL/min — ABNORMAL LOW (ref >60)
GFR calc non Af Amer: 41 mL/min — ABNORMAL LOW (ref >60)
Glucose, Bld: 109 mg/dL — ABNORMAL HIGH (ref 65–99)
Potassium: 4.5 mmol/L (ref 3.5–5.1)
Sodium: 139 mmol/L (ref 135–145)
Total Bilirubin: 1 mg/dL (ref 0.3–1.2)
Total Protein: 5.4 g/dL — ABNORMAL LOW (ref 6.5–8.1)

## 2017-08-04 LAB — BASIC METABOLIC PANEL
Anion gap: 6 (ref 5–15)
BUN: 78 mg/dL — ABNORMAL HIGH (ref 6–20)
CO2: 20 mmol/L — ABNORMAL LOW (ref 22–32)
Calcium: 7.9 mg/dL — ABNORMAL LOW (ref 8.9–10.3)
Chloride: 112 mmol/L — ABNORMAL HIGH (ref 101–111)
Creatinine, Ser: 1.48 mg/dL — ABNORMAL HIGH (ref 0.61–1.24)
GFR calc Af Amer: 56 mL/min — ABNORMAL LOW (ref >60)
GFR calc non Af Amer: 48 mL/min — ABNORMAL LOW (ref >60)
Glucose, Bld: 131 mg/dL — ABNORMAL HIGH (ref 65–99)
Potassium: 4.9 mmol/L (ref 3.5–5.1)
Sodium: 138 mmol/L (ref 135–145)

## 2017-08-04 LAB — CBC
HCT: 22.3 % — ABNORMAL LOW (ref 39.0–52.0)
HCT: 22.3 % — ABNORMAL LOW (ref 39.0–52.0)
Hemoglobin: 7.3 g/dL — ABNORMAL LOW (ref 13.0–17.0)
Hemoglobin: 7.6 g/dL — ABNORMAL LOW (ref 13.0–17.0)
MCH: 30.7 pg (ref 26.0–34.0)
MCH: 32.2 pg (ref 26.0–34.0)
MCHC: 32.7 g/dL (ref 30.0–36.0)
MCHC: 34.1 g/dL (ref 30.0–36.0)
MCV: 93.7 fL (ref 78.0–100.0)
MCV: 94.5 fL (ref 78.0–100.0)
Platelets: 98 10*3/uL — ABNORMAL LOW (ref 150–400)
Platelets: 87 10*3/uL — ABNORMAL LOW (ref 150–400)
RBC: 2.38 MIL/uL — ABNORMAL LOW (ref 4.22–5.81)
RBC: 2.36 MIL/uL — ABNORMAL LOW (ref 4.22–5.81)
RDW: 15 % (ref 11.5–15.5)
RDW: 15.5 % (ref 11.5–15.5)
WBC: 8.4 10*3/uL (ref 4.0–10.5)
WBC: 7.7 10*3/uL (ref 4.0–10.5)

## 2017-08-04 LAB — CBC WITH DIFFERENTIAL/PLATELET
Basophils Absolute: 0 10*3/uL (ref 0.0–0.1)
Basophils Relative: 0 %
Eosinophils Absolute: 0.1 10*3/uL (ref 0.0–0.7)
Eosinophils Relative: 1 %
HCT: 25.9 % — ABNORMAL LOW (ref 39.0–52.0)
Hemoglobin: 8.6 g/dL — ABNORMAL LOW (ref 13.0–17.0)
Lymphocytes Relative: 28 %
Lymphs Abs: 2.1 10*3/uL (ref 0.7–4.0)
MCH: 31.6 pg (ref 26.0–34.0)
MCHC: 33.2 g/dL (ref 30.0–36.0)
MCV: 95.2 fL (ref 78.0–100.0)
Monocytes Absolute: 0.4 10*3/uL (ref 0.1–1.0)
Monocytes Relative: 5 %
Neutro Abs: 4.9 10*3/uL (ref 1.7–7.7)
Neutrophils Relative %: 66 %
Platelets: 139 10*3/uL — ABNORMAL LOW (ref 150–400)
RBC: 2.72 MIL/uL — ABNORMAL LOW (ref 4.22–5.81)
RDW: 14.9 % (ref 11.5–15.5)
WBC: 7.5 10*3/uL (ref 4.0–10.5)

## 2017-08-04 LAB — ABO/RH: ABO/RH(D): O POS

## 2017-08-04 LAB — HEMOGLOBIN AND HEMATOCRIT, BLOOD
HCT: 22.2 % — ABNORMAL LOW (ref 39.0–52.0)
Hemoglobin: 7.4 g/dL — ABNORMAL LOW (ref 13.0–17.0)

## 2017-08-04 LAB — LIPASE, BLOOD: Lipase: 40 U/L (ref 11–51)

## 2017-08-04 LAB — PROTIME-INR
INR: 1.17
Prothrombin Time: 14.8 seconds (ref 11.4–15.2)

## 2017-08-04 LAB — PHOSPHORUS: Phosphorus: 2.7 mg/dL (ref 2.5–4.6)

## 2017-08-04 LAB — POC OCCULT BLOOD, ED: Fecal Occult Bld: NEGATIVE

## 2017-08-04 LAB — ETHANOL: Alcohol, Ethyl (B): <10 mg/dL (ref <10)

## 2017-08-04 LAB — MAGNESIUM: Magnesium: 1.6 mg/dL — ABNORMAL LOW (ref 1.7–2.4)

## 2017-08-04 MED ORDER — LORAZEPAM 2 MG/ML IJ SOLN: 2.0000 mg | INTRAMUSCULAR | Status: DC | PRN

## 2017-08-04 MED ORDER — SODIUM CHLORIDE 0.9 % IV SOLN
80.0000 mg | Freq: Once | INTRAVENOUS | Status: AC
  Administered 2017-08-04: 02:00:00 80 mg via INTRAVENOUS
  Filled 2017-08-04: qty 80

## 2017-08-04 MED ORDER — OCTREOTIDE LOAD VIA INFUSION
50.0000 ug | Freq: Once | INTRAVENOUS | Status: AC
  Administered 2017-08-04: 50 ug via INTRAVENOUS
  Filled 2017-08-04: qty 25

## 2017-08-04 MED ORDER — ACETAMINOPHEN 650 MG RE SUPP: 650.0000 mg | Freq: Four times a day (QID) | RECTAL | Status: DC | PRN

## 2017-08-04 MED ORDER — ACETAMINOPHEN 325 MG PO TABS
650.0000 mg | ORAL_TABLET | Freq: Four times a day (QID) | ORAL | Status: DC | PRN
  Administered 2017-08-05: 650 mg via ORAL
  Filled 2017-08-04: qty 2

## 2017-08-04 MED ORDER — SODIUM CHLORIDE 0.9 % IV BOLUS (SEPSIS)
1000.0000 mL | Freq: Once | INTRAVENOUS | Status: AC
  Administered 2017-08-04: 1000 mL via INTRAVENOUS

## 2017-08-04 MED ORDER — PANTOPRAZOLE SODIUM 40 MG IV SOLR
40.0000 mg | Freq: Two times a day (BID) | INTRAVENOUS | Status: DC
  Administered 2017-08-07: 40 mg via INTRAVENOUS
  Filled 2017-08-04: qty 40

## 2017-08-04 MED ORDER — NALTREXONE HCL 50 MG PO TABS
100.0000 mg | ORAL_TABLET | Freq: Every day | ORAL | Status: DC
  Administered 2017-08-04 – 2017-08-07 (×4): 100 mg via ORAL
  Filled 2017-08-04 (×4): qty 2

## 2017-08-04 MED ORDER — SODIUM CHLORIDE 0.9 % IV SOLN
1.0000 g | INTRAVENOUS | Status: DC
  Administered 2017-08-04 – 2017-08-05 (×2): 1 g via INTRAVENOUS
  Filled 2017-08-04 (×3): qty 10

## 2017-08-04 MED ORDER — FOLIC ACID 5 MG/ML IJ SOLN
1.0000 mg | Freq: Every day | INTRAMUSCULAR | Status: DC
  Administered 2017-08-04 – 2017-08-07 (×4): 1 mg via INTRAVENOUS
  Filled 2017-08-04 (×4): qty 0.2

## 2017-08-04 MED ORDER — SODIUM CHLORIDE 0.9 % IV SOLN
INTRAVENOUS | Status: AC
  Administered 2017-08-04: 11:00:00 via INTRAVENOUS

## 2017-08-04 MED ORDER — SODIUM CHLORIDE 0.9 % IV BOLUS (SEPSIS)
500.0000 mL | Freq: Once | INTRAVENOUS | Status: AC
  Administered 2017-08-04: 500 mL via INTRAVENOUS

## 2017-08-04 MED ORDER — SODIUM CHLORIDE 0.9 % IV SOLN
8.0000 mg/h | INTRAVENOUS | Status: DC
  Administered 2017-08-04: 8 mg/h via INTRAVENOUS
  Filled 2017-08-04 (×2): qty 80

## 2017-08-04 MED ORDER — SODIUM CHLORIDE 0.9 % IV SOLN
50.0000 ug/h | INTRAVENOUS | Status: DC
  Administered 2017-08-04 – 2017-08-06 (×5): 50 ug/h via INTRAVENOUS
  Filled 2017-08-04 (×11): qty 1

## 2017-08-04 MED ORDER — SENNOSIDES-DOCUSATE SODIUM 8.6-50 MG PO TABS: 1.0000 | ORAL_TABLET | Freq: Every evening | ORAL | Status: DC | PRN

## 2017-08-04 MED ORDER — THIAMINE HCL 100 MG/ML IJ SOLN
100.0000 mg | Freq: Every day | INTRAMUSCULAR | Status: DC
  Administered 2017-08-04 – 2017-08-07 (×4): 100 mg via INTRAVENOUS
  Filled 2017-08-04 (×4): qty 2

## 2017-08-04 MED ORDER — MAGNESIUM SULFATE 2 GM/50ML IV SOLN
2.0000 g | Freq: Once | INTRAVENOUS | Status: AC
  Administered 2017-08-04: 2 g via INTRAVENOUS
  Filled 2017-08-04: qty 50

## 2017-08-04 MED ORDER — SODIUM CHLORIDE 0.9 % IV SOLN: INTRAVENOUS | Status: DC

## 2017-08-04 MED ORDER — SODIUM CHLORIDE 0.9 % IV SOLN
1.0000 g | Freq: Once | INTRAVENOUS | Status: AC
  Administered 2017-08-04: 1 g via INTRAVENOUS
  Filled 2017-08-04: qty 10

## 2017-08-04 NOTE — ED Notes (Signed)
Pt with large black bowel movement.

## 2017-08-04 NOTE — Progress Notes (Signed)
Subjective: The medical team met with [name] Phillips today in the ED. Patient reports no overnight events and is in no acute distress. He mentions that he has not had any recent episodes of hematemesis. He reports some intermittent dizziness and L sided lateral chest wall pain, which he endorses as a sequelae to his recent blunt trauma injury (admitted earlier this month). He denies any new onset chest pain, shortness of breath, or headaches.   Objective: Vital signs in last 24 hours: Vitals:   08/04/17 0830 08/04/17 0900 08/04/17 1000 08/04/17 1100  BP: 93/60 95/68 104/71 (!) 88/61  Pulse: 66 73 79 78  Resp: '11 17 18 17  ' Temp:      TempSrc:      SpO2: 99% 100% 99% 98%  Weight:      Height:       Weight change:   Intake/Output Summary (Last 24 hours) at 08/04/2017 1245 Last data filed at 08/04/2017 0737 Gross per 24 hour  Intake 2330 ml  Output -  Net 2330 ml   BP (!) 88/61   Pulse 78   Temp 98.6 F (37 C) (Oral)   Resp 17   Ht '6\' 2"'  (1.88 m)   Wt 150 lb (68 kg)   SpO2 98%   BMI 19.26 kg/m  General appearance: alert, appears stated age, mild distress and lying comfortably in bed Head: Normocephalic, without obvious abnormality, atraumatic. ICD with well-healed surgical scar on upper R chest.  Lungs: clear to auscultation bilaterally Chest wall: right sided chest wall tenderness Heart: regular rate and rhythm, S1, S2 normal, no murmur, click, rub or gallop Abdomen: soft, non-tender Extremities: extremities normal, atraumatic, no cyanosis or edema  Skin: warm, dry, no rash noted, no erythema  Lab Results: Recent Results (from the past 2160 hour(s))  CBC no Diff     Status: Abnormal   Collection Time: 06/06/17  2:37 PM  Result Value Ref Range   WBC 4.6 3.4 - 10.8 x10E3/uL   RBC 3.94 (L) 4.14 - 5.80 x10E6/uL   Hemoglobin 12.5 (L) 13.0 - 17.7 g/dL   Hematocrit 37.9 37.5 - 51.0 %   MCV 96 79 - 97 fL   MCH 31.7 26.6 - 33.0 pg   MCHC 33.0 31.5 - 35.7 g/dL   RDW 16.5 (H)  12.3 - 15.4 %   Platelets 157 150 - 379 x10E3/uL  CBC with Differential/Platelet     Status: Abnormal   Collection Time: 08/04/17 12:31 AM  Result Value Ref Range   WBC 7.5 4.0 - 10.5 K/uL   RBC 2.72 (L) 4.22 - 5.81 MIL/uL   Hemoglobin 8.6 (L) 13.0 - 17.0 g/dL   HCT 25.9 (L) 39.0 - 52.0 %   MCV 95.2 78.0 - 100.0 fL   MCH 31.6 26.0 - 34.0 pg   MCHC 33.2 30.0 - 36.0 g/dL   RDW 14.9 11.5 - 15.5 %   Platelets 139 (L) 150 - 400 K/uL   Neutrophils Relative % 66 %   Neutro Abs 4.9 1.7 - 7.7 K/uL   Lymphocytes Relative 28 %   Lymphs Abs 2.1 0.7 - 4.0 K/uL   Monocytes Relative 5 %   Monocytes Absolute 0.4 0.1 - 1.0 K/uL   Eosinophils Relative 1 %   Eosinophils Absolute 0.1 0.0 - 0.7 K/uL   Basophils Relative 0 %   Basophils Absolute 0.0 0.0 - 0.1 K/uL    Comment: Performed at [facility], [address], [place], [place] [address]  Comprehensive metabolic panel     Status: Abnormal   Collection Time: 08/04/17 12:31 AM  Result Value Ref Range   Sodium 139 135 - 145 mmol/L   Potassium 4.5 3.5 - 5.1 mmol/L   Chloride 102 101 - 111 mmol/L   CO2 23 22 - 32 mmol/L   Glucose, Bld 109 (H) 65 - 99 mg/dL   BUN 71 (H) 6 - 20 mg/dL   Creatinine, Ser 1.68 (H) 0.61 - 1.24 mg/dL   Calcium 9.2 8.9 - 10.3 mg/dL   Total Protein 5.4 (L) 6.5 - 8.1 g/dL   Albumin 2.9 (L) 3.5 - 5.0 g/dL   AST 22 15 - 41 U/L   ALT 18 17 - 63 U/L   Alkaline Phosphatase 62 38 - 126 U/L   Total Bilirubin 1.0 0.3 - 1.2 mg/dL   GFR calc non Af Amer 41 (L) >60 mL/min   GFR calc Af Amer 48 (L) >60 mL/min    Comment: (NOTE) The eGFR has been calculated using the CKD EPI equation. This calculation has not been validated in all clinical situations. eGFR's persistently <60 mL/min signify possible Chronic Kidney Disease.    Anion gap 14 5 - 15    Comment: Performed at [facility] [address] [place], [place] [address]  Lipase, blood     Status: None   Collection Time: 08/04/17 12:31 AM  Result Value Ref  Range   Lipase 40 11 - 51 U/L    Comment: Performed at [facility], [place] [address] [place], [place] [address]  Ethanol     Status: None   Collection Time: 08/04/17 12:31 AM  Result Value Ref Range   Alcohol, Ethyl (B) <10 <10 mg/dL    Comment:        LOWEST DETECTABLE LIMIT FOR SERUM ALCOHOL IS 10 mg/dL FOR MEDICAL PURPOSES ONLY Performed at [facility], [place] [address] [place], [place] [address]   Protime-INR     Status: None   Collection Time: 08/04/17 12:31 AM  Result Value Ref Range   Prothrombin Time 14.8 11.4 - 15.2 seconds   INR 1.17     Comment: Performed at [facility] [address], [place], [place] [address]  Type and screen     Status: None (Preliminary result)   Collection Time: 08/04/17 12:31 AM  Result Value Ref Range   ABO/RH(D) O POS    Antibody Screen NEG    Sample Expiration 08/07/2017    Unit Number [id number]    Blood Component Type RED CELLS,LR    Unit division 00    Status of Unit ISSUED    Transfusion Status OK TO TRANSFUSE    Crossmatch Result      Compatible Performed at [facility], [place] [address] [place], [place] [address]   ABO/Rh     Status: None   Collection Time: 08/04/17 12:31 AM  Result Value Ref Range   ABO/RH(D)      O POS Performed at [facility] [address], [place], [place] [address]   BPAM RBC     Status: None (Preliminary result)   Collection Time: 08/04/17 12:31 AM  Result Value Ref Range   ISSUE DATE / TIME [id number]    Blood Product Unit Number [id number]    PRODUCT CODE E0336V00    Unit Type and Rh 5100    Blood Product Expiration Date [id number]   POC occult blood, ED  Status: None   Collection Time: 08/04/17  1:12 AM  Result Value Ref Range   Fecal Occult Bld NEGATIVE NEGATIVE  Prepare RBC     Status: None   Collection Time: 08/04/17  1:25 AM  Result Value Ref Range   Order Confirmation      ORDER PROCESSED BY BLOOD BANK Performed at [facility], [address], [place], [place] [address]   Magnesium     Status: Abnormal   Collection Time: 08/04/17  8:09 AM  Result Value Ref Range   Magnesium 1.6 (L) 1.7 - 2.4 mg/dL    Comment: Performed at [facility] [address], [place], [place] [address]  Phosphorus     Status: None   Collection Time: 08/04/17  8:09 AM  Result Value Ref Range   Phosphorus 2.7 2.5 - 4.6 mg/dL    Comment: Performed at [facility] [address], [place], [place] [address]  Basic metabolic panel     Status: Abnormal   Collection Time: 08/04/17  8:09 AM  Result Value Ref Range   Sodium 138 135 - 145 mmol/L   Potassium 4.9 3.5 - 5.1 mmol/L   Chloride 112 (H) 101 - 111 mmol/L   CO2 20 (L) 22 - 32 mmol/L   Glucose, Bld 131 (H) 65 - 99 mg/dL   BUN 78 (H) 6 - 20 mg/dL   Creatinine, Ser 1.48 (H) 0.61 - 1.24 mg/dL   Calcium 7.9 (L) 8.9 - 10.3 mg/dL   GFR calc non Af Amer 48 (L) >60 mL/min   GFR calc Af Amer 56 (L) >60 mL/min    Comment: (NOTE) The eGFR has been calculated using the CKD EPI equation. This calculation has not been validated in all clinical situations. eGFR's persistently <60 mL/min signify possible Chronic Kidney Disease.    Anion gap 6 5 - 15    Comment: Performed at [facility] [address] [place], [place] [address]  Hemoglobin and hematocrit, blood     Status: Abnormal   Collection Time: 08/04/17  8:09 AM  Result Value Ref Range   Hemoglobin 7.4 (L) 13.0 - 17.0 g/dL   HCT 22.2 (L) 39.0 - 52.0 %    Comment: Performed at [facility] [address] [place], [place] [address]  CBC     Status: Abnormal   Collection Time: 08/04/17 11:23 AM  Result Value Ref Range   WBC 8.4 4.0 - 10.5 K/uL   RBC 2.38 (L) 4.22 - 5.81 MIL/uL   Hemoglobin 7.3 (L) 13.0 - 17.0 g/dL   HCT 22.3 (L) 39.0 - 52.0 %   MCV 93.7 78.0 - 100.0 fL   MCH 30.7 26.0 - 34.0 pg   MCHC 32.7 30.0 - 36.0 g/dL   RDW 15.0 11.5 - 15.5 %   Platelets 98 (L) 150 - 400 K/uL    Comment: PLATELET  COUNT CONFIRMED BY SMEAR Performed at [facility] [address], [place], [place] [address]     Micro Results: No results found for this or any previous visit (from the past 240 hour(s)). Studies/Results: Dg Abd Acute W/chest  Result Date: 08/03/2017 CLINICAL DATA:  Hematemesis for 1 hour EXAM: DG ABDOMEN ACUTE W/ 1V CHEST COMPARISON:  07/27/2017 FINDINGS: Cardiac shadow is within normal limits. Defibrillator is again noted and stable. The lungs are well aerated bilaterally without focal infiltrate. Scattered large and small bowel gas is noted. Mild retained fecal material  is seen. No free air is noted. No abnormal mass or abnormal calcifications are noted. Degenerative changes of the lumbar spine are seen. IMPRESSION: No acute abnormality noted. Electronically Signed   By: Inez Catalina M.D.   On: 08/03/2017 23:50   Medications: I have reviewed the patient's current medications. Scheduled Meds: . folic acid  1 mg Intravenous Daily  . naltrexone  100 mg Oral Daily  . [START ON 08/07/2017] pantoprazole  40 mg Intravenous Q12H  . thiamine  100 mg Intravenous Daily   Continuous Infusions: . sodium chloride 100 mL/hr at 08/04/17 1126  . magnesium sulfate 1 - 4 g bolus IVPB    . octreotide  (SANDOSTATIN)    IV infusion 50 mcg/hr (08/04/17 0738)   PRN Meds:.acetaminophen **OR** acetaminophen, LORazepam, senna-docusate Assessment/Plan: Active Problems:   Upper GI bleed  [name] Phillips is a 62 y.o. Male with past medical history significant for HFrEF s/p ICD (TTE 2015: LVEF 25%-30%), hx of seizures, HTN, h/x of CVA (on daily Plavix), and alcohol use disorder (treated with Naltrexone) who presented to the ED with hematemesis. Patient admitted to the internal medicine teaching service for GI consulting and stabilization.   #Upper GI Bleed. Patient has had two episodes of hematemesis yesterday (03/10) due to increased NSAID use. With an elevated BUN:Cr ratio (>20:1) on admission, it  was suggestive of a GI bleed. Patient is a regular drinker, but there is no known history of cirrhosis and LFTs are all normal. Hemoglobin in January was 12.5 and later 8.6 on admission. However, Hgb was found to be 7.4 on recheck s/p 1u pRBC and 1.5L NS bolus. Pt was started on IV PPI drip, octreotide, and ceftriaxone for possible variceal bleed. GI was consulted. Patient has denied any overnight episodes of hematemesis, which makes the etiology of GI bleed slightly unclear at the moment, although variceal bleeding is likely. GI consulted for possible EGD per recommendations. - GI consulted, recommendations appreciated.  - Monitor hemoglobin/hematocrit levels - Continue IV Pantoprazole 40 mg BID - Continue IV Octreotide and Ceftriaxone for empiric treatment of variceal bleed - F/u CBC at 14:00, transfuse if Hgb <7.0 - NS @ rate of 100 mL/hr while NPO  #EtOH Abuse. On admission, patient reports alcohol consumption of 3-4 24oz cans of beer per night. Patient on naltrexone at home, but is continuing to use alcohol regularly.  - Continue home Naltrexone - CIWA with Lorazepam - MVI, thiamine, folate  #AKI. Cr 1.68 on admission, baseline ~0.9 - 1.2. Patient provided 1.5L IVF on admission. Most recent BMP values show Cr at 1.48 with BUN at 78 (Increased from 71 at 12:31am). - Continue fluids - Repeat BMP - Avoid nephrotoxic agents   #HTN and HFrEF s/p ICD. Patient currently asymptomatically hypotensive without tachycardia. No signs of acute CHF on physical exam. Patient home regimen includes Ramipril 59m BID, Carvedilol 246mBID, and Spironolactone 2577maily.  - Hold home Ramipril, Carvedilol, Spironolactone d/t to concern for AKI - Continue Atorvastatin 32m45mily  #FEN/GI - NPO pending GI procedure as indicated - NS 100 mL/hr - Replete Mg (1.6 on admission)   This is a [employer] information officere.  The care of the patient was discussed with Dr. MaryThomasene Ripple the assessment and plan  formulated with their assistance.  Please see their attached note for official documentation of the daily encounter.   LOS: 0 days   GhodToy Cookeydical Student 08/04/2017, 12:45 PM

## 2017-08-04 NOTE — H&P (Addendum)
Date: 08/04/2017               Patient Name:  [name] Phillips MRN: [id number]  DOB: 08-17-1955 Age / Sex: 62 y.o., male   PCP: Fuller Plan, MD         Medical Service: Internal Medicine Teaching Service         Attending Physician: Dr. Gust Rung, DO    First Contact: Dr. Saunders Revel Pager: [phone]  Second Contact: Dr. Samuella Cota Pager: [phone]       After Hours (After 5p/  First Contact Pager: [phone]  weekends / holidays): Second Contact Pager: [phone]   Chief Complaint: hematemesis  History of Present Illness:  [name] Phillips is a 62yo male with PMH of HFrEF s/p ICD (TTE 2015: LVEF 25-30%), HTN, CVA, and alcohol use disorder (on naltrexone) who presents with hematemesis.  On 3/3, he presented to the emergency room after falling on the commode. He states he hit his left anterior chest/flank. He was found to have a non-displaced left lateral seventh rib fracture. He was discharged with the plan to take NSAIDs for pain control. Since then, he has been taking ibuprofen 800mg  every 6 hours for the rib pain. He had been doing okay until last night when he had two episodes of dark brown/coffee colored emesis. He denies nausea, abdominal pain, chest pain, BRBPR, melena, SOB, or dizziness or lightheadedness. He states this has never happened before. Has never had an EGD or colonoscopy before. No history of abdominal surgeries. Family history notable for colon cancer in his brother and father, but he is not sure how old they were at the time of diagnosis. Denies use of other NSAIDs other than the scheduled ibuprofen. He also typically drinks 3-4 24-oz cans of beer a night.  Review of systems positive for subjective fevers, a mild chronic cough, and slight headache. Negative for dysuria or hematuria.  Smokes ~1/2ppd and on naltrexone for alcohol use disorder. Last drink was last night. He denies a history of withdrawal or seizures. Denies illicit drug use. Lives at home with his  relatives.  ED Course: - BP 80s/60s (which improved to 101/73 with fluids), HR 79, temp 98.4, RR 14, O2 99% on RA - Hb 8.6, plt 139, INR 1.17. Cr 1.68, BUN 71. FOBT negative. Blood alcohol negative. Lipase normal. - Abdominal x-ray without acute changes. - Received 1.5L IV NS bolus, octreotide drip, pantoprazole drip, and ceftriaxone. Transfusing 1 unit pRBC. Consulted GI and discussed with Dr. Bosie Clos who agrees with current plan and will see in AM.  Meds:  Current Meds  Medication Sig  . acetaminophen (TYLENOL) 325 MG tablet Take 2 tablets (650 mg total) by mouth every 6 (six) hours as needed. Do not take more than 4000mg  of tylenol per day  . atorvastatin (LIPITOR) 20 MG tablet Take 1 tablet (20 mg total) by mouth daily.  . carvedilol (COREG) 25 MG tablet TAKE 1 TABLET BY MOUTH TWO  TIMES DAILY WITH MEALS  . clopidogrel (PLAVIX) 75 MG tablet Take 1 tablet (75 mg total) by mouth daily with breakfast.  . ibuprofen (ADVIL,MOTRIN) 400 MG tablet Take 1 tablet (400 mg total) by mouth every 6 (six) hours as needed.  . Multiple Vitamin (MULTIVITAMIN WITH MINERALS) TABS tablet Take 1 tablet by mouth daily.  . naltrexone (DEPADE) 50 MG tablet Take 2 tablets (100 mg total) by mouth daily.  . ramipril (ALTACE) 10 MG capsule TAKE 1 CAPSULE BY MOUTH TWO TIMES DAILY  .  spironolactone (ALDACTONE) 25 MG tablet Take 1 tablet (25 mg total) by mouth daily.   Allergies: Allergies as of 08/03/2017 - Review Complete 08/03/2017  Allergen Reaction Noted  . Codeine Hives 12/03/2010  . Penicillins Hives and Itching 12/03/2010  . Prednisone Hives 07/31/2013   Past Medical History:  Diagnosis Date  . AICD (automatic cardioverter/defibrillator) present 08/08/2011  . Alcohol abuse   . Arthritis   . CHF (congestive heart failure) (HCC)   . CVA (cerebral vascular accident) (HCC)   . Hypertension   . Nonischemic cardiomyopathy (HCC)    a. Catheterization 2005 normal coronary arteries; EF 15-20%; b. 2011 EF  normal;  c. 2015 Echo: EF 25-30%;  d. s/p prior AICD with upgrade to MDT single lead ICD ser # [id number] h 2/2 ERI &12/19/2014).  . Seizure [facility])    Family History:  Family History  Problem Relation Age of Onset  . Heart disease Father   . Thyroid cancer Mother   . Hypertension Unknown   . Diabetes type II Sister   . Seizures Brother   . Lupus Sister    Social History:  Social History   Socioeconomic History  . Marital status: Married    Spouse name: None  . Number of children: None  . Years of education: None  . Highest education level: None  Social Needs  . Financial resource strain: None  . Food insecurity - worry: None  . Food insecurity - inability: None  . Transportation needs - medical: None  . Transportation needs - non-medical: None  Occupational History  . None  Tobacco Use  . Smoking status: Current Every Day Smoker    Packs/day: 1.00    Years: 39.00    Pack years: 39.00    Types: Cigarettes  . Smokeless tobacco: Former Neurosurgeon    Types: Snuff, Chew  Substance and Sexual Activity  . Alcohol use: Yes    Alcohol/week: 1.8 oz    Types: 3 Cans of beer per week    Comment: 2x/month  . Drug use: No    Comment: Former crack cocaine use for several years, last 9 years ago  . Sexual activity: None  Other Topics Concern  . None  Social History Narrative  . None   Review of Systems: A complete ROS was negative except as per HPI.  Physical Exam: Blood pressure 90/62, pulse 85, temperature 98.6 F (37 C), temperature source Oral, resp. rate 13, height 6\' 2"  (1.88 m), weight 150 lb (68 kg), SpO2 99 %.  GEN: Well-nourished tall male lying in bed. Alert and oriented. No acute distress. Edentulous. HENT: [place]/AT. Moist mucous membranes. No visible lesions. EYES: PERRL. Sclera non-icteric. Conjunctival pallor. RESP: Clear to auscultation bilaterally. No wheezes, rales, or rhonchi. No increased work of breathing. CV: Normal rate and regular rhythm. No murmurs, gallops,  or rubs. No LE edema. ABD: Soft. Non-tender. Non-distended. Normoactive bowel sounds. GU: Rectal exam deferred. EXT: No edema. Warm and well perfused. SKIN: Capillary refill >2sec. NEURO: Cranial nerves II-XII grossly intact. Strength 5/5 ankle flexion/extension and grip strength. No apparent audiovisual hallucinations. Speech fluent and appropriate. PSYCH: Patient is calm and pleasant. Appropriate affect. Well-groomed; speech is appropriate and on-subject.  Labs CBC Latest Ref Rng & Units 08/04/2017 06/06/2017 03/13/2016  WBC 4.0 - 10.5 K/uL 7.5 4.6 4.3  Hemoglobin 13.0 - 17.0 g/dL 4.0(J) 12.5(L) 11.9(L)  Hematocrit 39.0 - 52.0 % 25.9(L) 37.9 35.2(L)  Platelets 150 - 400 K/uL 139(L) 157 82(L)   CMP Latest Ref Rng &  Units 08/04/2017 03/04/2017 03/13/2016  Glucose 65 - 99 mg/dL 782(N) 77 90  BUN 6 - 20 mg/dL 56(O) 16 12  Creatinine 0.61 - 1.24 mg/dL 1.30(Q) 6.57 8.46  Sodium 135 - 145 mmol/L 139 139 139  Potassium 3.5 - 5.1 mmol/L 4.5 4.6 4.4  Chloride 101 - 111 mmol/L 102 103 99(L)  CO2 22 - 32 mmol/L 23 20 23   Calcium 8.9 - 10.3 mg/dL 9.2 9.2 9.4  Total Protein 6.5 - 8.1 g/dL 9.6(E) 7.0 7.4  Total Bilirubin 0.3 - 1.2 mg/dL 1.0 0.4 1.2  Alkaline Phos 38 - 126 U/L 62 116 107  AST 15 - 41 U/L 22 40 106(H)  ALT 17 - 63 U/L 18 35 71(H)   Lipase 40 Ethyl alcohol <10 FOBT negative INR 1.17  Abdominal x-ray: No acute abnormality noted.  Assessment & Plan by Problem: Active Problems:   Upper GI bleed  Mr. Detoro is a 62yo male with PMH of HFrEF s/p ICD (TTE 2015: LVEF 25-30%), HTN, CVA, and alcohol use disorder (on naltrexone) who presents with 2 episodes of hematemesis. Presentation is concerning for an upper GI bleed, particularly an ulcer with recent increased NSAID use in the setting of left rib fracture from recent fall. No free air noted on abdominal x-ray. He does not have a known history of cirrhosis but with his current alcohol use, variceal bleed is another consideration.  Finally, he had a recent fall ~1 week ago, so retroperitoneal hemorrhage may be possible. Hb drop of ~4 points from 12.5 in January to 8.6 today. Vitals notable for relative hypotension, although not tachycardic. Transfusing 1 unit pRBC and bolus 1.5L NS.  Hematemesis Two episodes of hematemesis yesterday in the setting of increased NSAID use. FOBT negative. BUN:Cr ratio >20:1 suggestive of upper GI bleed. Differential includes upper GI bleed (ulcer vs variceal bleed) vs retroperitoneal hemorrhage. No known history of cirrhosis (although daily alcohol user) and LFTs normal. Hb 12.5 in January to 8.6 today. S/p 1u pRBC and 1.5L NS bolus. Started on IV PPI drip, as well as octreotide and ceftriaxone for possible variceal bleed. If he clinically worsens, may benefit from CT A/P to assess for possible retroperitoneal bleed, however will hold off for now given AKI. GI has been consulted, agrees with current plan and will see in the AM. - Admit to stepdown - GI consulted; appreciate their assistance - Telemetry - NPO - Continue IV octreotide drip - Continue IV pantoprazole drip - Continue empiric IV ceftriaxone for possible variceal bleed - S/p 1u pRBC - CBC, BMP, Mg, P in AM  AKI Cr 1.68 on admission, baseline ~0.9-1.2. S/p 1.5L IVF - BMP in AM - Avoid nephrotoxic agents  HFrEF s/p ICD (TTE 2015: LVEF 25-30%) HTN Hypotensive on admission, patient is usually normotensive to hypertensive. Home regimen includes ramipril 10mg  BID, carvedilol 25mg  BID, and spironolactone 25mg  daily - Hold home ramipril, carvedilol, and spironolactone - Hold home plavix - Continue atorvastatin 20mg  daily  Alcohol use disorder On naltrexone at home but continuing to use alcohol. - Continue naltrexone - CIWA with Ativan - MVI, thiamine, folate  Diet: NPO VTE PPx: SCDs Code Status: Full code Dispo: Admit patient to Inpatient with expected length of stay greater than 2 midnights.  Signed: Scherrie Gerlach,  MD 08/04/2017, 3:29 AM  Pager: Demetrius Charity [phone]

## 2017-08-04 NOTE — ED Notes (Signed)
Notified pharmacy for OCreotide replacement bag

## 2017-08-04 NOTE — ED Notes (Signed)
Clear liquid diet lunch tray ordered 

## 2017-08-04 NOTE — Progress Notes (Signed)
Subjective:  Patient seen laying comfortably in bed this AM in no acute distress. States that he hasn't had any additional episodes of "throwing up blood" since he arrived to ED. He is currently experiencing intermittent dizziness and L lateral chest wall (over site on known, recent rib fx), but no headache, new radiating chest pain, shortness of breath or orthopnea.  Objective:  Vital signs in last 24 hours: Vitals:   08/04/17 0730 08/04/17 0800 08/04/17 0830 08/04/17 0900  BP: 92/76 98/79 93/60  95/68  Pulse: 75 71 66 73  Resp: 15 19 11 17   Temp:      TempSrc:      SpO2: 98% 98% 99% 100%  Weight:      Height:       Physical Exam  Constitutional:  Gentleman who appears stated age laying comfortably in bed in no acute distress.  HENT:  Mouth/Throat: Oropharynx is clear and moist.  Cardiovascular:  ICD with well healed surgical scar in place on R upper chest. Regular rate and rhythm. No murmurs appreciated. 2+ radial pulses bilaterally.  Respiratory:  No accessory muscle use or nasal flaring. Talking in full sentences during interview. Lungs without wheezing and crackles in bilateral lung fields.  GI: Soft. He exhibits no distension. There is no tenderness. There is no rebound.  Musculoskeletal: He exhibits no edema (of bilateral lower extremities) or tenderness (of bilateral lower extremities).  Skin: Skin is warm and dry. No rash noted. No erythema.   Assessment/Plan:  Active Problems:   Upper GI bleed  [name] Phillips is a 62yo male with PMH of HFrEF s/p ICD (last echo 2015, LVEF 25-30%), HTN, hx of CVA on daily Plavix, and alcohol use disorder on naltrexone who presented for evaluation of hematemesis after recently starting NSAID therapy for supportive care of L rib fracture. Patient admitted to the internal medicine teaching service with GI consulting. The specific problems addressed during admission are as follows:   Hematemesis: Patient arrived with Hgb= 8.6, which  represented significant drop from previous recording of Hgb= 12.5 on 06/06/2017. He received 1 unit pRBC's overnight and post transfusion H/H continues to decrease this AM (now Hgb = 7.4). Patient denies recurrent hematemesis overnight, so it is unclear if drop after transfusion represents additional bleeding in addition to upper GI bleed, upper GI bleed exacerbated by plavix, or dilutional effect from NS bolus (1.5L received overnight). Will recheck CBC this afternoon with plan to transfuse pRBC's for Hgb >7.0 and continue to clinically monitor for signs/symptoms of acute bleeding. Patient's upper GI bleed less likely to be a cause of variceal bleeding at this time, given lab work that does not suggest underlying cirrhosis (AST, ALT, T bili, INR, and platelets = 139). Will, however, continue to empirically treat for possibility until definitively ruled out with EGD per gastroenterology. -GI consulted in ED, recommendations appreciated -IV protonix 40 mg BID -Continue IV octreotide drip and ceftriaxone for empiric treatment of variceal bleed -Hold home Plavix -F/u CBC at 2:00 pm, transfuse for Hgb <7.0 -NS @ rate of 100 ml/hr while NPO  AKI: Patient's Cr was 1.68 on admission with baseline Cr =0.9-1.2. Patient's Cr improved to 1.48 with 1.5L this AM and admission lab work suggest pre-renal cause of patient's AKI (Bun/Cr >2.0). Will continue gentle fluids given hx of heart failure and avoid nephrotoxic agents.  -Repeat BMP in AM  Hx of HTN and HFrEF s/p ICD (last echo 2015 LVEF 25-30%): Patient currently hypotensive, without tachycardia and significant symptoms of hypotension.  Patient does not have current signs/symptoms of acute CHF on physical exam, as patient's lungs clear to auscultation, no orthopnea/respiratory distress, and no peripheral edema. The patient's home regimen includes ramipril 10mg  BID, carvedilol 25mg  BID, and spironolactone 25mg  daily and is normally normotensive on this regimen per  chart review.  -Hold home ramipril, carvedilol, and spironolactone -Continue atorvastatin 20mg  daily  Alcohol use disorder: Patient reported drinking up to four 24 ounce beers daily on admission and denied history of alcohol withdrawal/seizures. Patient on naltrexone at home, but continuing daily alcohol use. No current signs or symptoms of withdrawal.  -Continue home naltrexone -CIWA with Ativan  FEN/GI: -NPO pending possible procedure with GI  -NS @ rate of 100/ml/hr, replacing Mg today (1.6 on admission)  VTE Prophylaxis: SCD's while in bed, ambulate as tolerated Code Status: Full  Dispo: Anticipated discharge in approximately 2-3 day(s).   Rozann Lesches, MD 08/04/2017, 9:25 AM Pager: [phone]

## 2017-08-04 NOTE — ED Notes (Signed)
Dr.Pollina notified of low BP's-Monique,RN

## 2017-08-04 NOTE — ED Notes (Signed)
Notified pharmacy for overdue medications 

## 2017-08-04 NOTE — Consult Note (Signed)
[facility] Consultation Note  Referring Provider: Dr Mikey Bussing (Teaching Service) Primary Care Physician:  Fuller Plan, MD  Reason for Consultation:  Coffee ground emesis  HPI: [name] Phillips is a 62 y.o. male whom we've been asked to see for coffee ground emesis.  Patient on chronic clopidigrel for recurrent stroke, last ~ one year ago, on chronic clopidigrel.  Patient recent fall about one week ago, with left rib fracture, now taking ibuprofen ~ 3000 mg per day for past one week.  Presents one day history left-sided pain (near fracture site) and coffee ground/dark emesis.  No abdominal pain per se.  No melena or hematochezia.  No prior GI bleeding.  No PPI.  No prior endoscopy or colonoscopy.   Past Medical History:  Diagnosis Date  . AICD (automatic cardioverter/defibrillator) present 08/08/2011  . Alcohol abuse   . Arthritis   . CHF (congestive heart failure) (HCC)   . CVA (cerebral vascular accident) (HCC)   . Hypertension   . Nonischemic cardiomyopathy (HCC)    a. Catheterization 2005 normal coronary arteries; EF 15-20%; b. 2011 EF normal;  c. 2015 Echo: EF 25-30%;  d. s/p prior AICD with upgrade to MDT single lead ICD ser # [id number] h 2/2 ERI &12/19/2014).  . Seizure [facility])     Past Surgical History:  Procedure Laterality Date  . CARDIAC DEFIBRILLATOR PLACEMENT  2007   MDT [employer] single chamber ICD implanted with [phone] lead by Dr Ty Hilts for primary prevention  . EP IMPLANTABLE DEVICE N/A 12/19/2014   Procedure:  ICD Generator Changeout;  Surgeon: Duke Salvia, MD;  Location: [facility] INVASIVE CV LAB;  Service: Cardiovascular;  Laterality: N/A;  . EP IMPLANTABLE DEVICE N/A 12/19/2014   Procedure: Lead Revision;  Surgeon: Duke Salvia, MD;  Location: [facility] INVASIVE CV LAB;  Service: Cardiovascular;  Laterality: N/A;  . ICD GENERATOR CHANGE  12/19/2014    Prior to Admission medications   Medication Sig Start Date End Date Taking? Authorizing Provider  acetaminophen  (TYLENOL) 325 MG tablet Take 2 tablets (650 mg total) by mouth every 6 (six) hours as needed. Do not take more than 4000mg  of tylenol per day 07/27/17  Yes Couture, Cortni S, PA-C  atorvastatin (LIPITOR) 20 MG tablet Take 1 tablet (20 mg total) by mouth daily. 04/25/17  Yes Rice, Jamesetta Orleans, MD  carvedilol (COREG) 25 MG tablet TAKE 1 TABLET BY MOUTH TWO  TIMES DAILY WITH MEALS 04/25/17  Yes Rice, Jamesetta Orleans, MD  clopidogrel (PLAVIX) 75 MG tablet Take 1 tablet (75 mg total) by mouth daily with breakfast. 04/25/17  Yes Rice, Jamesetta Orleans, MD  ibuprofen (ADVIL,MOTRIN) 400 MG tablet Take 1 tablet (400 mg total) by mouth every 6 (six) hours as needed. 07/27/17  Yes Couture, Cortni S, PA-C  Multiple Vitamin (MULTIVITAMIN WITH MINERALS) TABS tablet Take 1 tablet by mouth daily.   Yes [provider]  naltrexone (DEPADE) 50 MG tablet Take 2 tablets (100 mg total) by mouth daily. 04/25/17  Yes Rice, Jamesetta Orleans, MD  ramipril (ALTACE) 10 MG capsule TAKE 1 CAPSULE BY MOUTH TWO TIMES DAILY 04/25/17  Yes Rice, Jamesetta Orleans, MD  spironolactone (ALDACTONE) 25 MG tablet Take 1 tablet (25 mg total) by mouth daily. 04/25/17  Yes Rice, Jamesetta Orleans, MD  folic acid (FOLVITE) 1 MG tablet Take 1 tablet (1 mg total) by mouth daily. Patient not taking: Reported on 08/04/2017 04/25/17   Fuller Plan, MD  thiamine (VITAMIN B-1) 100 MG tablet Take 1 tablet (100  mg total) by mouth daily. Patient not taking: Reported on 08/04/2017 06/06/17   Fuller Plan, MD    Current Facility-Administered Medications  Medication Dose Route Frequency Provider Last Rate Last Dose  . 0.9 %  sodium chloride infusion   Intravenous Continuous Nedrud, Marybeth, MD      . acetaminophen (TYLENOL) tablet 650 mg  650 mg Oral Q6H PRN Camelia Phenes, DO       Or  . acetaminophen (TYLENOL) suppository 650 mg  650 mg Rectal Q6H PRN Hoffman, Jessica Ratliff, DO      . folic acid injection 1 mg  1 mg Intravenous Daily  Hoffman, Jessica Ratliff, DO      . LORazepam (ATIVAN) injection 2-3 mg  2-3 mg Intravenous Q1H PRN Hoffman, Jessica Ratliff, DO      . magnesium sulfate IVPB 2 g 50 mL  2 g Intravenous Once Nedrud, Jeanella Flattery, MD      . naltrexone (DEPADE) tablet 100 mg  100 mg Oral Daily Hoffman, Jessica Ratliff, DO      . octreotide (SANDOSTATIN) 500 mcg in sodium chloride 0.9 % 250 mL (2 mcg/mL) infusion  50 mcg/hr Intravenous Continuous Gilda Crease, MD 25 mL/hr at 08/04/17 0738 50 mcg/hr at 08/04/17 0738  . [START ON 08/07/2017] pantoprazole (PROTONIX) injection 40 mg  40 mg Intravenous Q12H Pollina, Canary Brim, MD      . senna-docusate (Senokot-S) tablet 1 tablet  1 tablet Oral QHS PRN Geralyn Corwin Ratliff, DO      . thiamine (B-1) injection 100 mg  100 mg Intravenous Daily Hoffman, Bary Richard, DO       Current Outpatient Medications  Medication Sig Dispense Refill  . acetaminophen (TYLENOL) 325 MG tablet Take 2 tablets (650 mg total) by mouth every 6 (six) hours as needed. Do not take more than 4000mg  of tylenol per day 30 tablet 0  . atorvastatin (LIPITOR) 20 MG tablet Take 1 tablet (20 mg total) by mouth daily. 30 tablet 3  . carvedilol (COREG) 25 MG tablet TAKE 1 TABLET BY MOUTH TWO  TIMES DAILY WITH MEALS 180 tablet 1  . clopidogrel (PLAVIX) 75 MG tablet Take 1 tablet (75 mg total) by mouth daily with breakfast. 30 tablet 3  . ibuprofen (ADVIL,MOTRIN) 400 MG tablet Take 1 tablet (400 mg total) by mouth every 6 (six) hours as needed. 30 tablet 0  . Multiple Vitamin (MULTIVITAMIN WITH MINERALS) TABS tablet Take 1 tablet by mouth daily.    . naltrexone (DEPADE) 50 MG tablet Take 2 tablets (100 mg total) by mouth daily. 180 tablet 1  . ramipril (ALTACE) 10 MG capsule TAKE 1 CAPSULE BY MOUTH TWO TIMES DAILY 180 capsule 1  . spironolactone (ALDACTONE) 25 MG tablet Take 1 tablet (25 mg total) by mouth daily. 90 tablet 1  . folic acid (FOLVITE) 1 MG tablet Take 1 tablet (1 mg total) by  mouth daily. (Patient not taking: Reported on 08/04/2017) 90 tablet 2  . thiamine (VITAMIN B-1) 100 MG tablet Take 1 tablet (100 mg total) by mouth daily. (Patient not taking: Reported on 08/04/2017) 90 tablet 1    Allergies as of 08/03/2017 - Review Complete 08/03/2017  Allergen Reaction Noted  . Codeine Hives 12/03/2010  . Penicillins Hives and Itching 12/03/2010  . Prednisone Hives 07/31/2013    Family History  Problem Relation Age of Onset  . Heart disease Father   . Thyroid cancer Mother   . Hypertension Unknown   . Diabetes type II  Sister   . Seizures Brother   . Lupus Sister     Social History   Socioeconomic History  . Marital status: Married    Spouse name: Not on file  . Number of children: Not on file  . Years of education: Not on file  . Highest education level: Not on file  Social Needs  . Financial resource strain: Not on file  . Food insecurity - worry: Not on file  . Food insecurity - inability: Not on file  . Transportation needs - medical: Not on file  . Transportation needs - non-medical: Not on file  Occupational History  . Not on file  Tobacco Use  . Smoking status: Current Every Day Smoker    Packs/day: 1.00    Years: 39.00    Pack years: 39.00    Types: Cigarettes  . Smokeless tobacco: Former Neurosurgeon    Types: Snuff, Chew  Substance and Sexual Activity  . Alcohol use: Yes    Alcohol/week: 1.8 oz    Types: 3 Cans of beer per week    Comment: 2x/month  . Drug use: No    Comment: Former crack cocaine use for several years, last 9 years ago  . Sexual activity: Not on file  Other Topics Concern  . Not on file  Social History Narrative  . Not on file    Review of Systems: As per HPI, all others negative  Physical Exam: Vital signs in last 24 hours: Temp:  [98.3 F (36.8 C)-98.6 F (37 C)] 98.6 F (37 C) (03/11 0541) Pulse Rate:  [66-92] 79 (03/11 1000) Resp:  [11-28] 18 (03/11 1000) BP: (77-109)/(57-87) 104/71 (03/11 1000) SpO2:  [96  %-100 %] 99 % (03/11 1000) Weight:  [150 lb (68 kg)] 150 lb (68 kg) (03/10 2313)   General:   Alert, disheveled and cachectic-appearing, NAD Head:  Normocephalic and atraumatic. Eyes:  Sclera clear, no icterus.   Conjunctiva pink. Ears:  Normal auditory acuity. Nose:  No deformity, discharge,  or lesions. Mouth:  No deformity or lesions.  Oropharynx pink & moist. Neck:  Supple; no masses or thyromegaly. Abdomen:  Soft, nontender and nondistended. No masses, hepatosplenomegaly or hernias noted. Normal bowel sounds, without guarding, and without rebound.     Msk: Diffusely atrophic but symmetrical without gross deformities. Normal posture. Pulses:  Normal pulses noted. Extremities:  Without clubbing or edema. Neurologic:  Alert and  oriented x4;  Left hemicparesis Skin:  Intact without significant lesions or rashes. Psych:  Alert and cooperative. Normal mood and affect.   Lab Results: Recent Labs    08/04/17 0031 08/04/17 0809  WBC 7.5  --   HGB 8.6* 7.4*  HCT 25.9* 22.2*  PLT 139*  --    BMET Recent Labs    08/04/17 0031 08/04/17 0809  NA 139 138  K 4.5 4.9  CL 102 112*  CO2 23 20*  GLUCOSE 109* 131*  BUN 71* 78*  CREATININE 1.68* 1.48*  CALCIUM 9.2 7.9*   LFT Recent Labs    08/04/17 0031  PROT 5.4*  ALBUMIN 2.9*  AST 22  ALT 18  ALKPHOS 62  BILITOT 1.0   PT/INR Recent Labs    08/04/17 0031  LABPROT 14.8  INR 1.17    Studies/Results: Dg Abd Acute W/chest  Result Date: 08/03/2017 CLINICAL DATA:  Hematemesis for 1 hour EXAM: DG ABDOMEN ACUTE W/ 1V CHEST COMPARISON:  07/27/2017 FINDINGS: Cardiac shadow is within normal limits. Defibrillator is again noted and stable.  The lungs are well aerated bilaterally without focal infiltrate. Scattered large and small bowel gas is noted. Mild retained fecal material is seen. No free air is noted. No abnormal mass or abnormal calcifications are noted. Degenerative changes of the lumbar spine are seen. IMPRESSION: No  acute abnormality noted. Electronically Signed   By: Alcide Clever M.D.   On: 08/03/2017 23:50    Impression:  1.  Coffee ground emesis.  Suspect PUD or gastritis, likely incited by NSAIDs.  Other considerations not excluded, but history not at all typical for variceal bleeding. 2.  Ongoing alcohol use. 3.  Mild thrombocytopenia, could be BM suppression from alcohol versus cirrhosis with mild portal hypertension. 4.  Chronic anticoagulation, clopidigrel, in setting of recurrent strokes. 5.  Recurrent strokes with left hemiparesis. 6.  Relative hypotension; patient tells me his "normal" blood pressure systolic has been for past couple years in 90-100 range. 7.  Family history colon cancer.  Plan:  1.  Hold clopidigrel; no NSAIDs. 2.  PPI. 3.  Trial clear liquids. 4.  EGD in a couple days, after he's been off clopidigrel at least 48 hours (unless he develops destabilizing bleeding). 5.  Serial CBCs, transfuse as needed. 6.  OK to continue octreotide until etiology of patient's GI bleed has been clarified. 7.  [facility] will follow.  Defer any colon cancer screening; not optimal colonoscopy candidate, could consider virtual colonoscopy (CT colonography) at some point as outpatient.   LOS: 0 days   Bayne Fosnaugh M  08/04/2017, 10:28 AM  Cell [phone] If no answer or after 5 PM call [phone]

## 2017-08-04 NOTE — ED Notes (Signed)
Douglas Racz, wife, [phone] cell phone if she needs to be contacted.

## 2017-08-04 NOTE — ED Notes (Signed)
Admitting MD at the bedside.  

## 2017-08-04 NOTE — Progress Notes (Signed)
Pharmacy Antibiotic Note  [name] Phillips is a 62 y.o. male admitted on 08/03/2017 with variceal bleed.  Pharmacy has been consulted for Rocephin dosing.  Plan: Start ceftriaxone 1g IV Q24h Monitor clinical picture F/U C&S, abx deescalation / LOT  Consider need to continue abx  Height: 6\' 2"  (188 cm) Weight: 150 lb (68 kg) IBW/kg (Calculated) : 82.2  Temp (24hrs), Avg:98.5 F (36.9 C), Min:98.3 F (36.8 C), Max:98.6 F (37 C)  Recent Labs  Lab 08/04/17 0031 08/04/17 0809 08/04/17 1123 08/04/17 1350  WBC 7.5  --  8.4 7.7  CREATININE 1.68* 1.48*  --   --     Estimated Creatinine Clearance: 48.5 mL/min (A) (by C-G formula based on SCr of 1.48 mg/dL (H)).    Allergies  Allergen Reactions  . Codeine Hives  . Penicillins Hives and Itching    Has patient had a PCN reaction causing immediate rash, facial/tongue/throat swelling, SOB or lightheadedness with hypotension: unknown Has patient had a PCN reaction causing severe rash involving mucus membranes or skin necrosis: unknown Has patient had a PCN reaction that required hospitalization: unknown Has patient had a PCN reaction occurring within the last 10 years: no If all of the above answers are "NO", then may proceed with Cephalosporin use.   . Prednisone Hives    Thank you for allowing pharmacy to be a part of this patient's care.  Armandina Stammer 08/04/2017 6:34 PM

## 2017-08-04 NOTE — ED Notes (Signed)
Admitting MD at bedside-Monique,RN  

## 2017-08-04 NOTE — ED Notes (Signed)
ED Provider at bedside. 

## 2017-08-05 DIAGNOSIS — I5020 Unspecified systolic (congestive) heart failure: Secondary | ICD-10-CM

## 2017-08-05 DIAGNOSIS — X58XXXD Exposure to other specified factors, subsequent encounter: Secondary | ICD-10-CM

## 2017-08-05 DIAGNOSIS — K922 Gastrointestinal hemorrhage, unspecified: Secondary | ICD-10-CM

## 2017-08-05 DIAGNOSIS — D62 Acute posthemorrhagic anemia: Secondary | ICD-10-CM

## 2017-08-05 LAB — CBC
HCT: 19.6 % — ABNORMAL LOW (ref 39.0–52.0)
HCT: 23.3 % — ABNORMAL LOW (ref 39.0–52.0)
Hemoglobin: 6.5 g/dL — CL (ref 13.0–17.0)
Hemoglobin: 7.7 g/dL — ABNORMAL LOW (ref 13.0–17.0)
MCH: 30.8 pg (ref 26.0–34.0)
MCH: 30.7 pg (ref 26.0–34.0)
MCHC: 33.2 g/dL (ref 30.0–36.0)
MCHC: 33 g/dL (ref 30.0–36.0)
MCV: 92.9 fL (ref 78.0–100.0)
MCV: 92.8 fL (ref 78.0–100.0)
Platelets: 90 10*3/uL — ABNORMAL LOW (ref 150–400)
Platelets: 83 10*3/uL — ABNORMAL LOW (ref 150–400)
RBC: 2.11 MIL/uL — ABNORMAL LOW (ref 4.22–5.81)
RBC: 2.51 MIL/uL — ABNORMAL LOW (ref 4.22–5.81)
RDW: 15.2 % (ref 11.5–15.5)
RDW: 15.3 % (ref 11.5–15.5)
WBC: 7.5 10*3/uL (ref 4.0–10.5)
WBC: 5.6 10*3/uL (ref 4.0–10.5)

## 2017-08-05 LAB — MAGNESIUM: Magnesium: 1.8 mg/dL (ref 1.7–2.4)

## 2017-08-05 LAB — BASIC METABOLIC PANEL
Anion gap: 5 (ref 5–15)
BUN: 59 mg/dL — ABNORMAL HIGH (ref 6–20)
CO2: 20 mmol/L — ABNORMAL LOW (ref 22–32)
Calcium: 8 mg/dL — ABNORMAL LOW (ref 8.9–10.3)
Chloride: 111 mmol/L (ref 101–111)
Creatinine, Ser: 1.37 mg/dL — ABNORMAL HIGH (ref 0.61–1.24)
GFR calc Af Amer: >60 mL/min (ref >60)
GFR calc non Af Amer: 53 mL/min — ABNORMAL LOW (ref >60)
Glucose, Bld: 126 mg/dL — ABNORMAL HIGH (ref 65–99)
Potassium: 4.9 mmol/L (ref 3.5–5.1)
Sodium: 136 mmol/L (ref 135–145)

## 2017-08-05 LAB — MRSA PCR SCREENING: MRSA by PCR: NEGATIVE

## 2017-08-05 LAB — PREPARE RBC (CROSSMATCH)

## 2017-08-05 MED ORDER — SODIUM CHLORIDE 0.9 % IV SOLN
Freq: Once | INTRAVENOUS | Status: AC
  Administered 2017-08-05: 04:00:00 via INTRAVENOUS

## 2017-08-05 NOTE — Progress Notes (Signed)
[facility] Progress Note  [name] Phillips 62 y.o. 04/12/1956  CC:  GI bleed/coffee ground emesis   Subjective: Patient had 1 episode of black tarry stool yesterday. No bowel movement today. Denied further nausea and vomiting. Denied abdominal pain.  ROS : Negative for chest pain and shortness of breath.   Objective: Vital signs in last 24 hours: Vitals:   08/05/17 0644 08/05/17 0748  BP: 118/85 110/73  Pulse: 69 70  Resp: 19 19  Temp: 97.9 F (36.6 C) 98.2 F (36.8 C)  SpO2: 98% 100%    Physical Exam:  General:  Alert, cooperative, no distress, appears stated age  Head:  Normocephalic, without obvious abnormality, atraumatic  Eyes:  , EOM's intact,   Lungs:   Clear to auscultation bilaterally, respirations unlabored  Heart:  Regular rate and rhythm, S1, S2 normal  Abdomen:   Soft, non-tender, bowel sounds active all four quadrants,  no masses,   Extremities: Extremities normal, atraumatic, no  edema       Lab Results: Recent Labs    08/04/17 0809 08/05/17 0244  NA 138 136  K 4.9 4.9  CL 112* 111  CO2 20* 20*  GLUCOSE 131* 126*  BUN 78* 59*  CREATININE 1.48* 1.37*  CALCIUM 7.9* 8.0*  MG 1.6* 1.8  PHOS 2.7  --    Recent Labs    08/04/17 0031  AST 22  ALT 18  ALKPHOS 62  BILITOT 1.0  PROT 5.4*  ALBUMIN 2.9*   Recent Labs    08/04/17 0031  08/04/17 1350 08/05/17 0244  WBC 7.5   < > 7.7 7.5  NEUTROABS 4.9  --   --   --   HGB 8.6*   < > 7.6* 6.5*  HCT 25.9*   < > 22.3* 19.6*  MCV 95.2   < > 94.5 92.9  PLT 139*   < > 87* 90*   < > = values in this interval not displayed.   Recent Labs    08/04/17 0031  LABPROT 14.8  INR 1.17      Assessment/Plan: - Coffee-ground emesis and melena. Differential diagnosis would be peptic ulcer disease, esophagitis, gastritis. Less likely variceal bleed - Acute blood loss anemia - Thrombocytopenia. Patient with normal LFTs. Normal INR. Less likely from cirrhosis but possible given his alcohol  use - Recurrent stroke with the left sided weakness. Plavix currently on hold - Family history of colon cancer.  Recommendations ---------------------------- - EGD tomorrow. - Continue to hold her Plavix - Continue octreotide and PPI for now. - Consider virtual colonoscopy as an outpatient once acute issues are resolved.  Risks (bleeding, infection, bowel perforation that could require surgery, sedation-related changes in cardiopulmonary systems), benefits (identification and possible treatment of source of symptoms, exclusion of certain causes of symptoms), and alternatives (watchful waiting, radiographic imaging studies, empiric medical treatment)  were explained to patient and family in detail and patient wishes to proceed.  Darcus Edds MD, FACP 08/05/2017, 11:41 AM  Contact #  [phone] 

## 2017-08-05 NOTE — Care Management Note (Signed)
Case Management Note  Patient Details  Name: [name] Phillips MRN: [id number] Date of Birth: [birth year]  Subjective/Objective:       Pt admitted with emesis             Action/Plan:   PTA from home -  However recent falls.  PT will be ordered when stable for evaluation   Expected Discharge Date:                  Expected Discharge Plan:     In-House Referral:  Clinical Social Work  Discharge planning Services  CM Consult  Post Acute Care Choice:    Choice offered to:     DME Arranged:    DME Agency:     HH Arranged:    HH Agency:     Status of Service:     If discussed at [employer], dates discussed:    Additional Comments:  Cherylann Parr, RN 08/05/2017, 2:50 PM

## 2017-08-05 NOTE — Progress Notes (Signed)
Subjective: [name] is a 62 y.o. Male with PMhx significant for HFrEF s/p ICD (TTE 2015: LVEF 25-30%), hx of seizures, HTN, CVA on plavix and alcohol use disorder (on Naltrexone), who presented with 2 episodes of hematemesis the evening prior to admission. Patient reports feeling well overnight. He reports a black, large stool, which he describes as having the color of the food tray (solid black). He denies any hematemesis, dizziness, chest pain, abdominal/epigastric pain, or SOB. He does endorse headaches since being in the hospital, attributing it to a lack of sleep.   Objective: Vital signs in last 24 hours: Vitals:   08/05/17 0342 08/05/17 0417 08/05/17 0644 08/05/17 0748  BP: 126/81 105/68 118/85 110/73  Pulse: 87  69 70  Resp: (!) '25  19 19  ' Temp: 98 F (36.7 C) 98.3 F (36.8 C) 97.9 F (36.6 C) 98.2 F (36.8 C)  TempSrc: Oral Oral Oral Oral  SpO2:   98% 100%  Weight:      Height:       Weight change: 8 lb 4.6 oz (3.76 kg)  Intake/Output Summary (Last 24 hours) at 08/05/2017 1147 Last data filed at 08/05/2017 0717 Gross per 24 hour  Intake 2357.34 ml  Output 2000 ml  Net 357.34 ml   BP (!) 132/91 (BP Location: Left Arm)   Pulse 67   Temp 97.9 F (36.6 C) (Oral)   Resp 19   Ht '6\' 3"'  (1.905 m)   Wt 158 lb 4.6 oz (71.8 kg)   SpO2 98%   BMI 19.78 kg/m   General Appearance:    Alert, cooperative, no distress, lying comfortably in bed and soft-spoken on exam  Head:    Normocephalic, without obvious abnormality, atraumatic  Eyes:    PERRL, conjunctiva/corneas clear   Throat:   Lips, mucosa, and tongue normal; teeth and gums normal  Lungs:     Clear to auscultation bilaterally, respirations unlabored  Chest wall:    No tenderness or deformity. No accessory muscle use or flaring present. ICD noted on R side w/ well-healed scar  Heart:    Regular rate and rhythm, S1 and S2 normal, no murmur, rub   or gallop  Abdomen:     Soft, non-tender, bowel sounds active all  four quadrants,    no masses, no organomegaly  Extremities:   Extremities normal, atraumatic, no cyanosis or edema  Pulses:   2+ and symmetric all extremities  Skin:   Dry skin on bilateral extremities. Skin color, texture, turgor normal, no rashes or lesions   Lab Results: CBC     Status: Abnormal   Collection Time: 08/05/17  2:44 AM  Result Value Ref Range   WBC 7.5 4.0 - 10.5 K/uL   RBC 2.11 (L) 4.22 - 5.81 MIL/uL   Hemoglobin 6.5 (LL) 13.0 - 17.0 g/dL    Comment: REPEATED TO VERIFY CRITICAL RESULT CALLED TO, READ BACK BY AND VERIFIED WITH: A. LOWE,RN 3536 08/05/2017 T. TYSOR    HCT 19.6 (L) 39.0 - 52.0 %   MCV 92.9 78.0 - 100.0 fL   MCH 30.8 26.0 - 34.0 pg   MCHC 33.2 30.0 - 36.0 g/dL   RDW 15.2 11.5 - 15.5 %   Platelets 90 (L) 150 - 400 K/uL    Comment: REPEATED TO VERIFY CONSISTENT WITH PREVIOUS RESULT Performed at [facility], [place] [address], [place], [place] [address]   Basic metabolic panel     Status: Abnormal   Collection Time: 08/05/17  2:44 AM  Result Value Ref Range   Sodium 136 135 - 145 mmol/L   Potassium 4.9 3.5 - 5.1 mmol/L   Chloride 111 101 - 111 mmol/L   CO2 20 (L) 22 - 32 mmol/L   Glucose, Bld 126 (H) 65 - 99 mg/dL   BUN 59 (H) 6 - 20 mg/dL   Creatinine, Ser 1.37 (H) 0.61 - 1.24 mg/dL   Calcium 8.0 (L) 8.9 - 10.3 mg/dL   GFR calc non Af Amer 53 (L) >60 mL/min   GFR calc Af Amer >60 >60 mL/min    Comment: (NOTE) The eGFR has been calculated using the CKD EPI equation. This calculation has not been validated in all clinical situations. eGFR's persistently <60 mL/min signify possible Chronic Kidney Disease.    Anion gap 5 5 - 15    Comment: Performed at [facility] [address] [place], [place] [address]  Magnesium     Status: None   Collection Time: 08/05/17  2:44 AM  Result Value Ref Range   Magnesium 1.8 1.7 - 2.4 mg/dL    Comment: Performed at [facility] [address] [place], [place] [address]  Prepare RBC      Status: None   Collection Time: 08/05/17  3:28 AM  Result Value Ref Range   Order Confirmation      ORDER PROCESSED BY BLOOD BANK Performed at [facility], [place] [address], [place], [place] [address]   CBC     Status: Abnormal   Collection Time: 08/05/17 11:26 AM  Result Value Ref Range   WBC 5.6 4.0 - 10.5 K/uL   RBC 2.51 (L) 4.22 - 5.81 MIL/uL   Hemoglobin 7.7 (L) 13.0 - 17.0 g/dL   HCT 23.3 (L) 39.0 - 52.0 %   MCV 92.8 78.0 - 100.0 fL   MCH 30.7 26.0 - 34.0 pg   MCHC 33.0 30.0 - 36.0 g/dL   RDW 15.3 11.5 - 15.5 %   Platelets 83 (L) 150 - 400 K/uL    Comment: REPEATED TO VERIFY CONSISTENT WITH PREVIOUS RESULT Performed at [facility], [place] [address] [place], [place] [address]      Micro Results: Recent Results (from the past 240 hour(s))  MRSA PCR Screening     Status: None   Collection Time: 08/04/17  8:37 PM  Result Value Ref Range Status   MRSA by PCR NEGATIVE NEGATIVE Final    Comment:        The GeneXpert MRSA Assay (FDA approved for NASAL specimens only), is one component of a comprehensive MRSA colonization surveillance program. It is not intended to diagnose MRSA infection nor to guide or monitor treatment for MRSA infections. Performed at [facility], [place] [address], [place], [place] [address]    Studies/Results: Dg Abd Acute W/chest  Result Date: 08/03/2017 CLINICAL DATA:  Hematemesis for 1 hour EXAM: DG ABDOMEN ACUTE W/ 1V CHEST COMPARISON:  07/27/2017 FINDINGS: Cardiac shadow is within normal limits. Defibrillator is again noted and stable. The lungs are well aerated bilaterally without focal infiltrate. Scattered large and small bowel gas is noted. Mild retained fecal material is seen. No free air is noted. No abnormal mass or abnormal calcifications are noted. Degenerative changes of the lumbar spine are seen. IMPRESSION: No acute abnormality noted. Electronically Signed   By: Inez Catalina M.D.   On: 08/03/2017 23:50    Medications: I have reviewed the patient's current medications. Scheduled Meds: .  folic acid  1 mg Intravenous Daily  . naltrexone  100 mg Oral Daily  . [START ON 08/07/2017] pantoprazole  40 mg Intravenous Q12H  . thiamine  100 mg Intravenous Daily   Continuous Infusions: . cefTRIAXone (ROCEPHIN)  IV Stopped (08/04/17 2156)  . 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 is a 62 y.o. Male with past medical history significant for HFrEF s/p ICD (TTE 2015: LVEF 25%-30%), hx of seizures, HTN, h/x of CVA (on daily Plavix), and alcohol use disorder (treated with Naltrexone) who presented to the ED with hematemesis. Patient admitted to the internal medicine teaching service for GI consulting and stabilization.   #Upper GI Bleed. Patient has had two episodes of hematemesis yesterday (03/10) due to increased NSAID use. With an elevated BUN:Cr ratio (>20:1) on admission, it was suggestive of a GI bleed. Patient is a regular drinker, but there is no known history of cirrhosis and LFTs are all normal. Hemoglobin in January was 12.5 and later 8.6 on admission. However, Hgb was found to be 7.4 on recheck s/p 1u pRBC and 1.5L NS bolus. Pt was started on IV PPI drip, octreotide, and ceftriaxone for possible variceal bleed. GI was consulted. Today hemoglobin was found to be 6.5. Patient has denied any overnight episodes of hematemesis, which makes the etiology of GI bleed slightly unclear at the moment, although variceal bleeding is likely. Upper GI bleed more likely due to recent black stools. EGD will be completed 48 hours after d/c of Clopidogrel.  - GI consulted, recommendations  appreciated.  - Transfuse 1u pRBCs, f/u H/H this PM - Continue to Monitor hemoglobin/hematocrit levels - Continue IV Pantoprazole 40 mg BID - Continue IV Octreotide and Ceftriaxone for empiric treatment of variceal bleed  #EtOH Abuse. On admission, patient reports alcohol consumption of 3-4 24oz cans of beer per night. Patient on naltrexone at home, but is continuing to use alcohol regularly.  - Continue home Naltrexone - CIWA with Lorazepam - MVI, thiamine, folate  #AKI. Cr 1.68 on admission, baseline ~0.9 - 1.2. Patient provided 1.5L IVF on admission. Most recent BMP values show Cr at 1.48 with BUN at 78 (Increased from 71 at 12:31am). - Continue fluids - Repeat BMP - Avoid nephrotoxic agents   #HTN and HFrEF s/p ICD. Patient currently asymptomatically hypotensive without tachycardia. No signs of acute CHF on physical exam. Patient home regimen includes Ramipril 27m BID, Carvedilol 221mBID, and Spironolactone 2580maily.  - Hold home Ramipril, Carvedilol, Spironolactone d/t to concern for AKI - Continue Atorvastatin 49m58mily  #FEN/GI - NPO pending GI procedure as indicated - No IVF, electrolytes WNL - Replete Mg, goal 2.0 (currently 1.8, up from 1.6 on admission)  VTE Prophylaxis: SCD's while in bed, ambulate as tolerated Code Status: Full  Disposition: Anticipated discharge in approximately 2-3 days  This is a [employer] information officere.  The care of the patient was discussed with Dr. MaryThomasene Ripple the assessment and plan formulated with their assistance.  Please see their attached note for official documentation of the daily encounter.   LOS: 1 day   GhodToy [employer]okeydical Student 08/05/2017, 11:47 AM

## 2017-08-05 NOTE — Progress Notes (Signed)
Subjective:  Patient states he feels OK this AM. Reports one  dark,"plum black" BM last night. No additional episodes of hematemesis. Patient states that he has a headache, which he attributes to lack of sleep while in hospital. NO current dizziness, but did have some dizziness with standing to use bedside urinal yesterday. No other acute complaints. All questions answered bedside.  Objective: Vital signs in last 24 hours: Vitals:   08/04/17 2000 08/04/17 2339 08/05/17 0342 08/05/17 0417  BP: 120/82 (!) 113/92 126/81 105/68  Pulse: 73 76 87   Resp:  16 (!) 25   Temp:  98.7 F (37.1 C) 98 F (36.7 C) 98.3 F (36.8 C)  TempSrc:  Oral Oral Oral  SpO2:  98%    Weight:  158 lb 4.6 oz (71.8 kg)    Height:  6\' 3"  (1.905 m)     Physical Exam  Constitutional: He appears well-developed and well-nourished. No distress.  Cardiovascular:  ICD with well healed surgical scar in place on R upper chest. Regular rate and rhythm. No murmurs appreciated. 2+ radial pulses bilaterally.  Respiratory:  No accessory muscle use or nasal flaring. Lungs without wheezing and crackles in bilateral lung fields.  GI: Soft. Bowel sounds are normal. He exhibits no distension. There is no tenderness. There is no rebound.  Musculoskeletal: He exhibits no edema (of bilateral lower extremities) or tenderness (of bilateral lower extremities).  Skin: Skin is warm and dry. No rash noted. He is not diaphoretic. No erythema.  Chronic dry skin on bilateral lower extremities   Assessment/Plan:  Active Problems:   Alcohol abuse   HTN (hypertension)   Systolic CHF ([facility])   History of CVA (cerebrovascular accident)   Upper GI bleed   Acute blood loss anemia   Thrombocytopenia ([facility])  [name] Phillips is a 62yo male with PMH of HFrEF s/p ICD (last echo 2015, LVEF 25-30%), HTN, hx of CVA on daily Plavix, and alcohol use disorder on naltrexone who presented for evaluation of hematemesis after recently starting NSAID therapy for  supportive care of L rib fracture. Patient admitted to the internal medicine teaching service with GI consulting. The specific problems addressed during admission are as follows:   Acute blood loss anemia 2/2 upper GI bleed: Patient arrived with Hgb= 8.6, which represented significant drop from previous recording of Hgb= 12.5 on 06/06/2017. He received 1 unit pRBC's overnight and remained stable around 7.5. Patient had dark bowel movement overnight and morning Hgb 6.5. Patient had another unit of pRBC's ordered early this AM, with CBC planned for later this afternoon. Low clinical suspicion for variceal bleed at this time, but will continue empiric treatment until EGD given his continued drop in Hgb that resulted in need for additional transfusion overnight.  -GI consulted in ED, recommendations appreciated -IV protonix 40 mg BID -Continue IV octreotide drip and ceftriaxone  -Hold home Plavix -1 unit pRBC's today, f/u post transfusion H/H this afternoon  Hx of HTN and HFrEF s/p ICD (last echo 2015 LVEF 25-30%): Patient currently normotensive at rest put reports symptoms of orthostasis while walking around room. Patient received IVF yesterday, no clinical signs or symptoms of acute CHF exacerbation today. Will perform orthostatics and consider additional bolus if patient symptomatic and does not need additional transfusion. -Hold home ramipril, carvedilol, and spironolactone for relative hypotension -Orthostatic vital signs -Continue atorvastatin 20mg  daily  AKI: Improving, Patient's Cr was 1.68 on admission with baseline Cr =0.9-1.2. Patient's Cr improved to 1.37 with IVF yesterday. Will continue  to encourage PO intake with clear liquid diet today. -Encourage PO intake  Alcohol use disorder: Patient reported drinking up to four 24 ounce beers daily on admission and denied history of alcohol withdrawal/seizures. No current signs or symptoms of withdrawal on physical exam -Continue home  naltrexone -CIWA assessment q4 hours  FEN/GI: -Clear liquid diet -No IVF, electrolytes within normal limits  VTE Prophylaxis: SCD's while in bed, ambulate as tolerated Code Status: Full  Dispo: Anticipated discharge in approximately 2-3 day(s).   Rozann Lesches, MD 08/05/2017, 6:40 AM Pager: [phone]

## 2017-08-05 NOTE — H&P (View-Only) (Signed)
[facility] Gastroenterology Progress Note  [name] Phillips 62 y.o. 1955/10/16  CC:  GI bleed/coffee ground emesis   Subjective: Patient had 1 episode of black tarry stool yesterday. No bowel movement today. Denied further nausea and vomiting. Denied abdominal pain.  ROS : Negative for chest pain and shortness of breath.   Objective: Vital signs in last 24 hours: Vitals:   08/05/17 0644 08/05/17 0748  BP: 118/85 110/73  Pulse: 69 70  Resp: 19 19  Temp: 97.9 F (36.6 C) 98.2 F (36.8 C)  SpO2: 98% 100%    Physical Exam:  General:  Alert, cooperative, no distress, appears stated age  Head:  Normocephalic, without obvious abnormality, atraumatic  Eyes:  , EOM's intact,   Lungs:   Clear to auscultation bilaterally, respirations unlabored  Heart:  Regular rate and rhythm, S1, S2 normal  Abdomen:   Soft, non-tender, bowel sounds active all four quadrants,  no masses,   Extremities: Extremities normal, atraumatic, no  edema       Lab Results: Recent Labs    08/04/17 0809 08/05/17 0244  NA 138 136  K 4.9 4.9  CL 112* 111  CO2 20* 20*  GLUCOSE 131* 126*  BUN 78* 59*  CREATININE 1.48* 1.37*  CALCIUM 7.9* 8.0*  MG 1.6* 1.8  PHOS 2.7  --    Recent Labs    08/04/17 0031  AST 22  ALT 18  ALKPHOS 62  BILITOT 1.0  PROT 5.4*  ALBUMIN 2.9*   Recent Labs    08/04/17 0031  08/04/17 1350 08/05/17 0244  WBC 7.5   < > 7.7 7.5  NEUTROABS 4.9  --   --   --   HGB 8.6*   < > 7.6* 6.5*  HCT 25.9*   < > 22.3* 19.6*  MCV 95.2   < > 94.5 92.9  PLT 139*   < > 87* 90*   < > = values in this interval not displayed.   Recent Labs    08/04/17 0031  LABPROT 14.8  INR 1.17      Assessment/Plan: - Coffee-ground emesis and melena. Differential diagnosis would be peptic ulcer disease, esophagitis, gastritis. Less likely variceal bleed - Acute blood loss anemia - Thrombocytopenia. Patient with normal LFTs. Normal INR. Less likely from cirrhosis but possible given his alcohol  use - Recurrent stroke with the left sided weakness. Plavix currently on hold - Family history of colon cancer.  Recommendations ---------------------------- - EGD tomorrow. - Continue to hold her Plavix - Continue octreotide and PPI for now. - Consider virtual colonoscopy as an outpatient once acute issues are resolved.  Risks (bleeding, infection, bowel perforation that could require surgery, sedation-related changes in cardiopulmonary systems), benefits (identification and possible treatment of source of symptoms, exclusion of certain causes of symptoms), and alternatives (watchful waiting, radiographic imaging studies, empiric medical treatment)  were explained to patient and family in detail and patient wishes to proceed.  Kathi Der MD, FACP 08/05/2017, 11:41 AM  Contact #  [phone]

## 2017-08-06 ENCOUNTER — Inpatient Hospital Stay (HOSPITAL_COMMUNITY): Payer: Medicare HMO

## 2017-08-06 ENCOUNTER — Inpatient Hospital Stay (HOSPITAL_COMMUNITY): Payer: Medicare HMO | Admitting: Certified Registered Nurse Anesthetist

## 2017-08-06 ENCOUNTER — Encounter (HOSPITAL_COMMUNITY)
Admission: EM | Disposition: A | Discharge status: Home / Self Care | Payer: Self-pay | Source: Home / Self Care | Attending: Internal Medicine

## 2017-08-06 ENCOUNTER — Encounter (HOSPITAL_COMMUNITY): Payer: Self-pay | Admitting: *Deleted

## 2017-08-06 DIAGNOSIS — K254 Chronic or unspecified gastric ulcer with hemorrhage: Principal | ICD-10-CM

## 2017-08-06 HISTORY — PX: ESOPHAGOGASTRODUODENOSCOPY (EGD) WITH PROPOFOL: SHX5813

## 2017-08-06 LAB — TYPE AND SCREEN
ABO/RH(D): O POS
Antibody Screen: NEGATIVE
Unit division: 0
Unit division: 0

## 2017-08-06 LAB — CBC
HCT: 22.1 % — ABNORMAL LOW (ref 39.0–52.0)
Hemoglobin: 7.5 g/dL — ABNORMAL LOW (ref 13.0–17.0)
MCH: 31.5 pg (ref 26.0–34.0)
MCHC: 33.9 g/dL (ref 30.0–36.0)
MCV: 92.9 fL (ref 78.0–100.0)
Platelets: 81 10*3/uL — ABNORMAL LOW (ref 150–400)
RBC: 2.38 MIL/uL — ABNORMAL LOW (ref 4.22–5.81)
RDW: 15.2 % (ref 11.5–15.5)
WBC: 4.8 10*3/uL (ref 4.0–10.5)

## 2017-08-06 LAB — BPAM RBC
Blood Product Expiration Date: 201904052359
Blood Product Expiration Date: 201904072359
ISSUE DATE / TIME: 201903110254
ISSUE DATE / TIME: 201903120353
Unit Type and Rh: 5100
Unit Type and Rh: 5100

## 2017-08-06 LAB — BASIC METABOLIC PANEL WITH GFR
Anion gap: 6 (ref 5–15)
BUN: 34 mg/dL — ABNORMAL HIGH (ref 6–20)
CO2: 22 mmol/L (ref 22–32)
Calcium: 8.3 mg/dL — ABNORMAL LOW (ref 8.9–10.3)
Chloride: 107 mmol/L (ref 101–111)
Creatinine, Ser: 1.26 mg/dL — ABNORMAL HIGH (ref 0.61–1.24)
GFR calc Af Amer: >60 mL/min
GFR calc non Af Amer: 59 mL/min — ABNORMAL LOW
Glucose, Bld: 127 mg/dL — ABNORMAL HIGH (ref 65–99)
Potassium: 4.3 mmol/L (ref 3.5–5.1)
Sodium: 135 mmol/L (ref 135–145)

## 2017-08-06 SURGERY — ESOPHAGOGASTRODUODENOSCOPY (EGD) WITH PROPOFOL
Anesthesia: Monitor Anesthesia Care

## 2017-08-06 MED ORDER — MENTHOL 3 MG MT LOZG
1.0000 | LOZENGE | OROMUCOSAL | Status: DC | PRN
  Administered 2017-08-06: 3 mg via ORAL
  Filled 2017-08-06 (×2): qty 9

## 2017-08-06 MED ORDER — PROPOFOL 500 MG/50ML IV EMUL
INTRAVENOUS | Status: DC | PRN
  Administered 2017-08-06: 100 ug/kg/min via INTRAVENOUS

## 2017-08-06 MED ORDER — SODIUM CHLORIDE 0.9 % IV SOLN
INTRAVENOUS | Status: DC
  Administered 2017-08-06: 10:00:00 via INTRAVENOUS

## 2017-08-06 MED ORDER — BUTAMBEN-TETRACAINE-BENZOCAINE 2-2-14 % EX AERO
INHALATION_SPRAY | CUTANEOUS | Status: DC | PRN
  Administered 2017-08-06: 1 via TOPICAL

## 2017-08-06 MED ORDER — PROPOFOL 10 MG/ML IV BOLUS
INTRAVENOUS | Status: DC | PRN
  Administered 2017-08-06 (×2): 20 mg via INTRAVENOUS
  Administered 2017-08-06: 10 mg via INTRAVENOUS

## 2017-08-06 SURGICAL SUPPLY — 15 items

## 2017-08-06 NOTE — Progress Notes (Signed)
Subjective:  Patient states he feels fine this AM. He didn't get any sleep last night, which is his only real complaint this morning. He denies additional episodes of hematemesis and melena. Patient states he feels hungry and wants to eat today. Excited for the procedure scheduled for today with GI doctor.  Objective: Vital signs in last 24 hours: Vitals:   08/05/17 1944 08/05/17 2302 08/06/17 0425 08/06/17 0812  BP: 134/86 124/80 118/90 109/67  Pulse: (!) 59 61 66   Resp:      Temp: 98.3 F (36.8 C) 98.4 F (36.9 C) 98.2 F (36.8 C) 98.1 F (36.7 C)  TempSrc: Oral Oral Oral Oral  SpO2: 100% 100% 100%   Weight:      Height:       Physical Exam  Constitutional: He appears well-developed and well-nourished. No distress.  HENT:  Mouth/Throat: Oropharynx is clear and moist.  Cardiovascular:  ICD with well healed surgical scar in place on R upper chest. Regular rate and rhythm. No murmurs appreciated.  Respiratory:  Laying comfortably flat in bed. No accessory muscle use or nasal flaring. Lungs without wheezing and crackles in anterior lung fields.  GI: Soft. Bowel sounds are normal. He exhibits no distension. There is no tenderness.  Musculoskeletal: He exhibits no edema (of bilateral lower extremities) or tenderness (of bilateral lower extremities).  Skin: Skin is warm and dry. No rash noted. He is not diaphoretic. No erythema.  Chronic dry skin on bilateral lower extremities   Assessment/Plan:  Active Problems:   Alcohol abuse   HTN (hypertension)   Systolic CHF ([facility])   History of CVA (cerebrovascular accident)   Upper GI bleed   Acute blood loss anemia   Thrombocytopenia ([facility])  [name] Phillips is a 62yo male with PMH of HFrEF s/p ICD (last echo 2015, LVEF 25-30%), HTN, hx of CVA on daily Plavix, and alcohol use disorder on naltrexone who presented for evaluation of hematemesis after recently starting NSAID therapy for supportive care of L rib fracture. Patient admitted to  the internal medicine teaching service with GI consulting. The specific problems addressed during admission are as follows:   Acute blood loss anemia 2/2 upper GI bleed: Patient arrived with Hgb= 8.6, which represented significant drop from previous recording of Hgb= 12.5 on 06/06/2017. Patient has received total of 2 units pRBC's during hospitalization for intermittent, acute drops in Hgb related to initial hematemesis and episode of melena on HD#1. Current Hgb 7.5, stable from 7.7 after transfusion yesterday. EGD today for diagnosis/intervention. Likely will be able to D/C octreotide and antibiotics after procedure. -GI consulted in ED, recommendations appreciated -IV protonix 40 mg BID -Continue IV octreotide drip and ceftriaxone  -Hold home Plavix  Hx of HTN and HFrEF s/p ICD (last echo 2015 LVEF 25-30%): Patient currently stably normotensive. Orthostatics negative yesterday. Symptoms improved today with additional unit transfused yesterday. No current signs or symptoms of volume overload on exam. Will continue to clinically monitor.  -Continue to hold home ramipril, carvedilol, and spironolactone, consider restarting after procedure later today. -Continue atorvastatin 20mg  daily  AKI: Resolved. Patient's Cr was 1.68 on admission with baseline Cr =0.9-1.2. Patient's Cr improved to baseline of 1.26 this AM -Continue to encourage PO intake  Alcohol use disorder: Patient reported drinking up to four 24 ounce beers daily on admission and denied history of alcohol withdrawal/seizures. No current signs or symptoms of withdrawal on physical exam. Last CIWA = 0 -Continue home naltrexone -CIWA assessment q6 hours  FEN/GI: -Currently  NPO, Clear liquid diet after EGD -No IVF, electrolytes within normal limits  VTE Prophylaxis: SCD's while in bed, ambulate as tolerated Code Status: Full  Dispo: Anticipated discharge in approximately 1-2 day(s).   Rozann Lesches, MD 08/06/2017, 9:21 AM Pager:  [phone]

## 2017-08-06 NOTE — Transfer of Care (Signed)
Immediate Anesthesia Transfer of Care Note  Patient: [name] Phillips [name]  Procedure(s) Performed: ESOPHAGOGASTRODUODENOSCOPY (EGD) WITH PROPOFOL (N/A )  Patient Location: Endoscopy Unit  Anesthesia Type:MAC  Level of Consciousness: awake, alert , oriented and patient cooperative  Airway & Oxygen Therapy: Patient Spontanous Breathing and Patient connected to nasal cannula oxygen  Post-op Assessment: Report given to RN, Post -op Vital signs reviewed and stable and Patient moving all extremities X 4  Post vital signs: Reviewed and stable  Last Vitals:  Vitals:   08/06/17 0812 08/06/17 0937  BP: 109/67 (!) 146/94  Pulse: 68 61  Resp:  15  Temp: 36.7 C 36.6 C  SpO2:  99%    Last Pain:  Vitals:   08/06/17 0937  TempSrc: Oral  PainSc: 6       Patients Stated Pain Goal: 2 (12/45/80 9983)  Complications: No apparent anesthesia complications

## 2017-08-06 NOTE — Op Note (Signed)
08/03/2017 - 08/06/2017  10:51 AM  PATIENT:  [name] Phillips  62 y.o. male  PRE-OPERATIVE DIAGNOSIS:  Coffee-ground emesis, melena  POST-OPERATIVE DIAGNOSIS:  gastric ulcer  PROCEDURE:  Procedure(s): ESOPHAGOGASTRODUODENOSCOPY (EGD) WITH PROPOFOL (N/A)  SURGEON:  Surgeon(s) and Role:    * Lorra Freeman, MD - Primary  Findings ------------- - EGD showed 2 clean-based gastric ulcers. One ulcer had pigmented spot. No evidence of active bleeding. Mild gastritis. Biopsies taken.  Recommendations ------------------------- - Start soft diet. - Continue IV twice a day PPI while in the hospital, switch to oral twice a day PPI for 8 weeks. - Repeat EGD in 8-12 weeks to document healing of ulcers. - Okay to resume Plavix after 3 days. - Recheck hemoglobin in the morning. - Hopefully discharge tomorrow if hemoglobin remains stable. - Avoid NSAIDs.   Kathi Der MD, FACP 08/06/2017, 10:53 AM  Contact #  [phone]

## 2017-08-06 NOTE — Op Note (Signed)
[facility] Patient Name: [name] Phillips Procedure Date : 08/06/2017 MRN: [id number] Attending MD: Kathi Der , MD Date of Birth: 07-14-1955 CSN: [id number] Age: 62 Admit Type: Inpatient Procedure:                Upper GI endoscopy Indications:              Coffee-ground emesis, Melena Providers:                Kathi Der, MD, Leandrew Koyanagi, RN, Verita Schneiders, Technician, Arlee Muslim Tech., Technician Referring MD:              Medicines:                Sedation Administered by an Anesthesia Professional Complications:            No immediate complications. Estimated Blood Loss:     Estimated blood loss was minimal. Procedure:                Pre-Anesthesia Assessment:                           - Prior to the procedure, a History and Physical                            was performed, and patient medications and                            allergies were reviewed. The patient's tolerance of                            previous anesthesia was also reviewed. The risks                            and benefits of the procedure and the sedation                            options and risks were discussed with the patient.                            All questions were answered, and informed consent                            was obtained. Prior Anticoagulants: The patient has                            taken Plavix (clopidogrel), last dose was 3 days                            prior to procedure. ASA Grade Assessment: IV - A                            patient with severe systemic disease that is a  constant threat to life. After reviewing the risks                            and benefits, the patient was deemed in                            satisfactory condition to undergo the procedure.                           After obtaining informed consent, the endoscope was                            passed under direct  vision. Throughout the                            procedure, the patient's blood pressure, pulse, and                            oxygen saturations were monitored continuously. The                            EG-[id number] ([id number]) scope was introduced through the                            mouth, and advanced to the second part of duodenum.                            The upper GI endoscopy was technically difficult                            and complex due to the patient's poor cooperation.                            The patient tolerated the procedure fairly well. Scope In: Scope Out: Findings:      Normal mucosa was found in the entire esophagus.      A small hiatal hernia was present.      Two small non-bleeding superficial gastric ulcers , one with pigmented       material were found on the lesser curvature of the gastric body.      Scattered minimal inflammation was found in the gastric antrum. Biopsies       were taken with a cold forceps for histology.      The cardia and gastric fundus were normal on retroflexion.      The duodenal bulb, first portion of the duodenum and second portion of       the duodenum were normal. Impression:               - Normal mucosa was found in the entire esophagus.                           - Small hiatal hernia.                           - Non-bleeding gastric ulcers with pigmented  material.                           - Gastritis. Biopsied.                           - Normal duodenal bulb, first portion of the                            duodenum and second portion of the duodenum. Moderate Sedation:      Moderate (conscious) sedation was personally administered by an       anesthesia professional. The following parameters were monitored: oxygen       saturation, heart rate, blood pressure, and response to care. Recommendation:           - Return patient to hospital ward for ongoing care.                           - Soft  diet.                           - Continue present medications.                           - Resume Plavix (clopidogrel) at prior dose in 3                            days.                           - Await pathology results.                           - Repeat upper endoscopy in 8 weeks to check                            healing.                           - Return to GI office in 6 weeks. Procedure Code(s):        --- Professional ---                           [phone], Esophagogastroduodenoscopy, flexible,                            transoral; with biopsy, single or multiple Diagnosis Code(s):        --- Professional ---                           K44.9, Diaphragmatic hernia without obstruction or                            gangrene                           K25.9, Gastric ulcer, unspecified as acute or  chronic, without hemorrhage or perforation                           K29.70, Gastritis, unspecified, without bleeding                           K92.0, Hematemesis                           K92.1, Melena (includes Hematochezia) CPT copyright 2016 American Medical Association. All rights reserved. The codes documented in this report are preliminary and upon coder review may  be revised to meet current compliance requirements. Kathi Der, MD Kathi Der, MD 08/06/2017 10:50:39 AM Number of Addenda: 0

## 2017-08-06 NOTE — Anesthesia Postprocedure Evaluation (Signed)
Anesthesia Post Note  Patient: [name] Phillips  Procedure(s) Performed: ESOPHAGOGASTRODUODENOSCOPY (EGD) WITH PROPOFOL (N/A )     Patient location during evaluation: PACU Anesthesia Type: MAC Level of consciousness: awake and alert Pain management: pain level controlled Vital Signs Assessment: post-procedure vital signs reviewed and stable Respiratory status: spontaneous breathing, nonlabored ventilation, respiratory function stable and patient connected to nasal cannula oxygen Cardiovascular status: stable and blood pressure returned to baseline Postop Assessment: no apparent nausea or vomiting Anesthetic complications: no    Last Vitals:  Vitals:   08/06/17 1055 08/06/17 1058  BP: 119/86 114/90  Pulse: 78 74  Resp: 18 15  Temp:    SpO2: 98% 98%    Last Pain:  Vitals:   08/06/17 1049  TempSrc: Oral  PainSc:                  Tiajuana Amass

## 2017-08-06 NOTE — Anesthesia Preprocedure Evaluation (Signed)
Anesthesia Evaluation  Patient identified by MRN, date of birth, ID band Patient awake    Reviewed: Allergy & Precautions, NPO status , Patient's Chart, lab work & pertinent test results  Airway Mallampati: II  TM Distance: >3 FB Neck ROM: Full    Dental   Pulmonary Current Smoker,    breath sounds clear to auscultation       Cardiovascular hypertension, Pt. on medications and Pt. on home beta blockers +CHF  + Cardiac Defibrillator  Rhythm:Regular Rate:Normal     Neuro/Psych Seizures -,  CVA    GI/Hepatic Neg liver ROS, GI bleed   Endo/Other  negative endocrine ROS  Renal/GU Renal InsufficiencyRenal disease     Musculoskeletal  (+) Arthritis ,   Abdominal   Peds  Hematology  (+) anemia ,   Anesthesia Other Findings   Reproductive/Obstetrics                             Lab Results  Component Value Date   WBC 4.8 08/06/2017   HGB 7.5 (L) 08/06/2017   HCT 22.1 (L) 08/06/2017   MCV 92.9 08/06/2017   PLT 81 (L) 08/06/2017   Lab Results  Component Value Date   CREATININE 1.26 (H) 08/06/2017   BUN 34 (H) 08/06/2017   NA 135 08/06/2017   K 4.3 08/06/2017   CL 107 08/06/2017   CO2 22 08/06/2017    Anesthesia Physical Anesthesia Plan  ASA: IV  Anesthesia Plan: MAC   Post-op Pain Management:    Induction: Intravenous  PONV Risk Score and Plan: 0 and Propofol infusion and Treatment may vary due to age or medical condition  Airway Management Planned: Natural Airway, Nasal Cannula and Simple Face Mask  Additional Equipment:   Intra-op Plan:   Post-operative Plan:   Informed Consent: I have reviewed the patients History and Physical, chart, labs and discussed the procedure including the risks, benefits and alternatives for the proposed anesthesia with the patient or authorized representative who has indicated his/her understanding and acceptance.     Plan Discussed with:  CRNA  Anesthesia Plan Comments:         Anesthesia Quick Evaluation

## 2017-08-06 NOTE — Progress Notes (Signed)
Subjective: [name] is a 62 y.o. Male who presented with 2 episodes of hematemesis the evening prior to admission. Patient reports feeling the same overnight. He continues to endorse a lack of sleep. Denies any bowel movements since yesterday when he reported a black, tarry stool. He denies any further hematemesis, dizziness, chest pain, abdominal/epigastric pain, or SOB. The team discussed with [name] the plan to complete EGD today per GI.   Objective: Vital signs in last 24 hours: Vitals:   08/06/17 1049 08/06/17 1055 08/06/17 1058 08/06/17 1100  BP: (!) 143/84 119/86 114/90   Pulse:  78 74   Resp:  18 15   Temp: 97.8 F (36.6 C)   (!) 97.5 F (36.4 C)  TempSrc: Oral   Oral  SpO2:  98% 98%   Weight:      Height:       Weight change:   Intake/Output Summary (Last 24 hours) at 08/06/2017 1347 Last data filed at 08/06/2017 1300 Gross per 24 hour  Intake 1441.67 ml  Output 2325 ml  Net -883.33 ml   BP 114/90   Pulse 74   Temp (!) 97.5 F (36.4 C) (Oral)   Resp 15   Ht '6\' 3"'  (1.905 m)   Wt 158 lb 4.6 oz (71.8 kg)   SpO2 98%   BMI 19.78 kg/m    General Appearance:    Alert, cooperative, no distress, lying comfortably in bed and soft-spoken on exam  Head:    Normocephalic, without obvious abnormality, atraumatic  Eyes:    PERRL, conjunctiva/corneas clear   Throat:   Lips, mucosa, and tongue normal; teeth and gums normal  Lungs:     Clear to auscultation bilaterally, respirations unlabored  Chest wall:    No tenderness or deformity. No accessory muscle use or flaring present. ICD noted on R side w/ well-healed scar  Heart:    Regular rate and rhythm, S1 and S2 normal, no murmur, rub   or gallop  Abdomen:     Soft, non-tender, bowel sounds active all four quadrants,    no masses, no organomegaly  Extremities:   Extremities normal, atraumatic, no cyanosis or edema  Pulses:   2+ and symmetric all extremities  Skin:   Dry skin on bilateral extremities. Skin color,  texture, turgor normal, no rashes or lesions   Lab Results:  Ref Range & Units 03:14 1d ago 2d ago  Sodium 135 - 145 mmol/L 135  136  138   Potassium 3.5 - 5.1 mmol/L 4.3  4.9  4.9   Chloride 101 - 111 mmol/L 107  111  112 Abnormally high    CO2 22 - 32 mmol/L 22  20 Abnormally low   20 Abnormally low    Glucose, Bld 65 - 99 mg/dL 127 Abnormally high   126 Abnormally high   131 Abnormally high    BUN 6 - 20 mg/dL 34 Abnormally high   59 Abnormally high   78 Abnormally high    Creatinine, Ser 0.61 - 1.24 mg/dL 1.26 Abnormally high   1.37 Abnormally high   1.48 Abnormally high    Calcium 8.9 - 10.3 mg/dL 8.3 Abnormally low   8.0 Abnormally low   7.9 Abnormally low    GFR calc non Af Amer >60 mL/min 59 Abnormally low   53 Abnormally low   48 Abnormally low    GFR calc Af Amer >60 mL/min >60  >60 CM 56 Abnormally low  CM  Comment: (NOTE)  The eGFR has been calculated using the CKD EPI equation.  This calculation has not been validated in all clinical situations.  eGFR's persistently <60 mL/min signify possible Chronic Kidney  Disease.   Anion gap 5 - '15 6  5 ' CM 6 CM  Comment: Performed at [facility] [address] [place], [place] [address]    Ref Range & Units 03:14 (08/06/17) 1d ago (08/05/17) 1d ago (08/05/17)  WBC 4.0 - 10.5 K/uL 4.8  5.6  7.5   RBC 4.22 - 5.81 MIL/uL 2.38 Abnormally low   2.51 Abnormally low   2.11 Abnormally low    Hemoglobin 13.0 - 17.0 g/dL 7.5 Abnormally low   7.7 Abnormally low   6.5 Critically low  CM  HCT 39.0 - 52.0 % 22.1 Abnormally low   23.3 Abnormally low   19.6 Abnormally low    MCV 78.0 - 100.0 fL 92.9  92.8  92.9   MCH 26.0 - 34.0 pg 31.5  30.7  30.8   MCHC 30.0 - 36.0 g/dL 33.9  33.0  33.2   RDW 11.5 - 15.5 % 15.2  15.3  15.2   Platelets 150 - 400 K/uL 81 Abnormally low   83 Abnormally low  CM 90 Abnormally low  CM  Comment: CONSISTENT WITH PREVIOUS RESULT  Performed at [facility] [address], [place], [place] [address]      Micro Results: Recent Results (from the past 240 hour(s))  MRSA PCR Screening     Status: None   Collection Time: 08/04/17  8:37 PM  Result Value Ref Range Status   MRSA by PCR NEGATIVE NEGATIVE Final    Comment:        The GeneXpert MRSA Assay (FDA approved for NASAL specimens only), is one component of a comprehensive MRSA colonization surveillance program. It is not intended to diagnose MRSA infection nor to guide or monitor treatment for MRSA infections. Performed at [facility], [place] [address] [place], [place] [address]    Studies/Results: Esophagogastroduodenoscopy (EGD) w/ Propofol  Result Date: 08/06/2017 FINDINGS: EGD showed 2 clean-based gastric ulcers. One ulcer had pigmented spot. No evidence of active bleeding. Mild gastritis. Biopsies taken.  US Abdomen Limited Ruq  Result Date: 08/06/2017 CLINICAL DATA:  Liver disease EXAM: ULTRASOUND ABDOMEN LIMITED RIGHT UPPER QUADRANT COMPARISON:  None. FINDINGS: Gallbladder: Dependent sludge. No visualized stone. No wall thickening or focal tenderness. Common bile duct: Partially seen. Diameter: 7 mm, upper normal. Where visualized, no filling defect. Negative biliary labs. Liver: No focal lesion identified. Within normal limits in parenchymal echogenicity. Portal vein is patent on color Doppler imaging with normal direction of blood flow towards the liver. IMPRESSION: 1. Negative visualized liver. 2. Gallbladder sludge. Electronically Signed   By: Monte Fantasia M.D.   On: 08/06/2017 08:00   Medications: I have reviewed the patient's current medications. Scheduled Meds: . folic acid  1 mg Intravenous Daily  . naltrexone  100 mg Oral Daily  . [START ON 08/07/2017] pantoprazole  40 mg Intravenous Q12H  . thiamine  100 mg Intravenous Daily   Continuous Infusions: . cefTRIAXone (ROCEPHIN)  IV Stopped (08/05/17 2129)   PRN Meds:.acetaminophen **OR** acetaminophen, LORazepam, senna-docusate Assessment/Plan: Active  Problems:   Alcohol abuse   HTN (hypertension)   Systolic CHF (HCC)   History of CVA (cerebrovascular accident)   Upper GI bleed   Acute blood loss anemia   Thrombocytopenia ([place])  [name] is a 62 y.o. Male who presented  to the ED with hematemesis, likely due to upper GI bleed. Patient was admitted on internal medicine service with close follow-up by gastroenterology. On recent EGD, patient found to have two clean-based gastric ulcers with no evidence of active bleeding. Mild gastritis was noted and biopsies are pending per GI. Patient is currently admitted to the internal medicine teaching service with GI consulting. Problems assessed as below:   Acute Blood Loss anemia 2/2 upper GI Bleed: Patient has had two episodes of hematemesis yesterday (03/10) due to increased NSAID use. With an elevated BUN:Cr ratio (>20:1) on admission, it was suggestive of a GI bleed. Patient is a regular drinker, but there is no known history of cirrhosis and LFTs are all normal. Hemoglobin in January was 12.5 and later 8.6 on admission. However, Hgb was found to be 7.4 on recheck s/p 1u pRBC and 1.5L NS bolus. Pt was started on IV PPI drip, octreotide, and ceftriaxone for possible variceal bleed on admission. GI was consulted. Today hemoglobin was found to be 7.5. On recent EGD (03/13), ulcers were noted without any evidence of bleeding or varices. Octreotide and Ceftriaxone will be d/c.  - Continue IV Pantoprazole 40 mg BID - Discontinue IV Octreotide drip and Ceftriaxone Per GI:  - Continue IV twice a day PPI while in the hospital, switch to oral twice a day PPI for 8 weeks. - Repeat EGD in 8-12 weeks to document healing of ulcers. - Resume Plavix after 3 days. - Recheck hemoglobin in the morning. - Hopefully discharge tomorrow if hemoglobin remains stable. - Avoid NSAIDs.   Alcohol Use Disorder: Patient reports drinking up to 3-4 24 oz cans of beer per night. Patient on naltrexone at home, continuing to use  alcohol regularly.  - Continue home Naltrexone - CIWA with Lorazepam - MVI, thiamine, folate  #AKI.Cr 1.68 on admission, baseline ~0.9 - 1.2. Patient provided 1.5L IVF on admission. Most recent BMP values show Cr at 1.48 with BUN at 78 (Increased from 71 at 12:31am). - Continue fluids - Repeat BMP - Avoid nephrotoxic agents   #HTN and HFrEF s/p ICD.Patient currently asymptomatically hypotensive without tachycardia. No signs of acute CHF on physical exam. Patient home regimen includes Ramipril 39m BID, Carvedilol 265mBID, and Spironolactone 2523maily.  - Hold home Ramipril, Carvedilol, Spironolactone d/t to concern for AKI - Continue Atorvastatin 10m55mily  FEN/GI:  - Start soft diet. Currently NPO. - No IVF, electrolytes within normal limits  VTE Prophylaxis: SCDs while in bed, ambulate as tolerated Code Status: Full  Disposition: Anticipated discharge in approximately 1-2 days  This is a [employer] information officere.  The care of the patient was discussed with Dr. NedrBerneice Gandy the assessment and plan formulated with their assistance.  Please see their attached note for official documentation of the daily encounter.   LOS: 2 days   GhodToy Cookeydical Student 08/06/2017, 1:47 PM

## 2017-08-06 NOTE — Interval H&P Note (Signed)
History and Physical Interval Note:  08/06/2017 10:20 AM  [name] Phillips  has presented today for surgery, with the diagnosis of Coffee-ground emesis, melena  The various methods of treatment have been discussed with the patient and family. After consideration of risks, benefits and other options for treatment, the patient has consented to  Procedure(s): ESOPHAGOGASTRODUODENOSCOPY (EGD) WITH PROPOFOL (N/A) as a surgical intervention .  The patient's history has been reviewed, patient examined, no change in status, stable for surgery.  I have reviewed the patient's chart and labs.  Questions were answered to the patient's satisfaction.    Patient denied any further bleeding episodes. Hemoglobin improved after blood transfusion.  Risks (bleeding, infection, bowel perforation that could require surgery, sedation-related changes in cardiopulmonary systems), benefits (identification and possible treatment of source of symptoms, exclusion of certain causes of symptoms), and alternatives (watchful waiting, radiographic imaging studies, empiric medical treatment)  were explained to patient in detail and patient wishes to proceed.  Demaris Leavell

## 2017-08-06 NOTE — Discharge Instructions (Signed)
Esophagogastroduodenoscopy, Care After °Refer to this sheet in the next few weeks. These instructions provide you with information about caring for yourself after your procedure. Your health care provider may also give you more specific instructions. Your treatment has been planned according to current medical practices, but problems sometimes occur. Call your health care provider if you have any problems or questions after your procedure. °What can I expect after the procedure? °After the procedure, it is common to have: °· A sore throat. °· Nausea. °· Bloating. °· Dizziness. °· Fatigue. ° °Follow these instructions at home: °· Do not eat or drink anything until the numbing medicine (local anesthetic) has worn off and your gag reflex has returned. You will know that the local anesthetic has worn off when you can swallow comfortably. °· Do not drive for 24 hours if you received a medicine to help you relax (sedative). °· If your health care provider took a tissue sample for testing during the procedure, make sure to get your test results. This is your responsibility. Ask your health care provider or the department performing the test when your results will be ready. °· Keep all follow-up visits as told by your health care provider. This is important. °Contact a health care provider if: °· You cannot stop coughing. °· You are not urinating. °· You are urinating less than usual. °Get help right away if: °· You have trouble swallowing. °· You cannot eat or drink. °· You have throat or chest pain that gets worse. °· You are dizzy or light-headed. °· You faint. °· You have nausea or vomiting. °· You have chills. °· You have a fever. °· You have severe abdominal pain. °· You have black, tarry, or bloody stools. °This information is not intended to replace advice given to you by your health care provider. Make sure you discuss any questions you have with your health care provider. °Document Released: 04/29/2012 Document  Revised: 10/19/2015 Document Reviewed: 04/06/2015 °Elsevier Interactive Patient Education © 2018 [employer] ° °

## 2017-08-07 ENCOUNTER — Other Ambulatory Visit: Payer: Self-pay

## 2017-08-07 ENCOUNTER — Other Ambulatory Visit: Payer: Self-pay | Admitting: Internal Medicine

## 2017-08-07 DIAGNOSIS — K259 Gastric ulcer, unspecified as acute or chronic, without hemorrhage or perforation: Secondary | ICD-10-CM

## 2017-08-07 DIAGNOSIS — K253 Acute gastric ulcer without hemorrhage or perforation: Secondary | ICD-10-CM

## 2017-08-07 DIAGNOSIS — I10 Essential (primary) hypertension: Secondary | ICD-10-CM

## 2017-08-07 DIAGNOSIS — I428 Other cardiomyopathies: Secondary | ICD-10-CM

## 2017-08-07 DIAGNOSIS — K2971 Gastritis, unspecified, with bleeding: Secondary | ICD-10-CM

## 2017-08-07 LAB — BASIC METABOLIC PANEL
Anion gap: 6 (ref 5–15)
BUN: 26 mg/dL — ABNORMAL HIGH (ref 6–20)
CO2: 24 mmol/L (ref 22–32)
Calcium: 8.4 mg/dL — ABNORMAL LOW (ref 8.9–10.3)
Chloride: 106 mmol/L (ref 101–111)
Creatinine, Ser: 1.41 mg/dL — ABNORMAL HIGH (ref 0.61–1.24)
GFR calc Af Amer: 59 mL/min — ABNORMAL LOW (ref >60)
GFR calc non Af Amer: 51 mL/min — ABNORMAL LOW (ref >60)
Glucose, Bld: 120 mg/dL — ABNORMAL HIGH (ref 65–99)
Potassium: 4.3 mmol/L (ref 3.5–5.1)
Sodium: 136 mmol/L (ref 135–145)

## 2017-08-07 LAB — CBC
HCT: 23.7 % — ABNORMAL LOW (ref 39.0–52.0)
Hemoglobin: 7.9 g/dL — ABNORMAL LOW (ref 13.0–17.0)
MCH: 31.3 pg (ref 26.0–34.0)
MCHC: 33.3 g/dL (ref 30.0–36.0)
MCV: 94 fL (ref 78.0–100.0)
Platelets: 97 10*3/uL — ABNORMAL LOW (ref 150–400)
RBC: 2.52 MIL/uL — ABNORMAL LOW (ref 4.22–5.81)
RDW: 15.2 % (ref 11.5–15.5)
WBC: 7.2 10*3/uL (ref 4.0–10.5)

## 2017-08-07 MED ORDER — PANTOPRAZOLE SODIUM 40 MG PO TBEC: 40.0000 mg | DELAYED_RELEASE_TABLET | Freq: Two times a day (BID) | ORAL | 0 refills | Status: DC

## 2017-08-07 MED ORDER — CLOPIDOGREL BISULFATE 75 MG PO TABS
75.0000 mg | ORAL_TABLET | Freq: Every day | ORAL | 0 refills | Status: DC
Start: 2017-08-10 — End: 2017-08-07

## 2017-08-07 NOTE — Progress Notes (Signed)
   Subjective:  Patient feels well this AM. No reports of melena or hematemesis overnight. No other acute complaints.  Objective: Vital signs in last 24 hours: Vitals:   08/06/17 1749 08/06/17 1951 08/07/17 0014 08/07/17 0409  BP: 110/84 101/77 102/77 122/83  Pulse: (!) 40 76 75 64  Resp: 17 18 15 20   Temp:  98.5 F (36.9 C) 98.4 F (36.9 C) 98.1 F (36.7 C)  TempSrc:  Oral Oral Oral  SpO2: 100% 100% 99% 99%  Weight:      Height:       Physical Exam  Constitutional: He appears well-developed and well-nourished. No distress.  HENT:  Mouth/Throat: Oropharynx is clear and moist.  Cardiovascular:  Regular rate and rhythm. No murmurs appreciated.  Respiratory:  Sitting comfortably in bed. No accessory muscle use or nasal flaring. Lungs without wheezing and crackles in anterior lung fields.  Musculoskeletal: He exhibits no edema (of bilateral lower extremities) or tenderness (of bilateral lower extremities).  Skin: Skin is warm and dry. No rash noted. He is not diaphoretic. No erythema.  Chronic dry skin on bilateral lower extremities   Assessment/Plan:  Active Problems:   Alcohol abuse   HTN (hypertension)   Systolic CHF (HCC)   History of CVA (cerebrovascular accident)   Upper GI bleed   Acute blood loss anemia   Thrombocytopenia (HCC)  [name] Phillips is a 62yo male with PMH of HFrEF s/p ICD (last echo 2015, LVEF 25-30%), HTN, hx of CVA on daily Plavix, and alcohol use disorder on naltrexone who presented for evaluation of hematemesis after recently starting NSAID therapy for supportive care of L rib fracture. Patient admitted to the internal medicine teaching service with GI consulting. The specific problems addressed during admission are as follows:   Acute blood loss anemia 2/2 upper GI bleed: EGD showed 2 ulcers, which were biopsied, and mild gastritis. Patient to get repeat EGD as an outpatient with GI in 2-3 months to ensure resolution of ulcers. Hgb stable this AM at  7.9, from 7.5 yesterday. This is overall reassuring that the patient does not have ongoing bleeding.  -GI consulted in ED, recommendations appreciated -Switch to oral PPI for 8 weeks after discharge -Hold home Plavix for 2 more days  Hx of HTN and HFrEF s/p ICD (last echo 2015 LVEF 25-30%): Patient currently stably normotensive. -Continue to hold home ramipril, carvedilol, and spironolactone, will restart this upon discharge -Continue atorvastatin 20mg  daily  AKI: Patient's Cr was 1.68 on admission with baseline Cr =0.9-1.2. Patient's Cr improved to baseline of 1.26 yesterday, back to 1.41 after procedure. Will continue to encourage PO intake and clinically monitor.  Alcohol use disorder: No current signs or symptoms of withdrawal on physical exam. Last CIWA = 0 -Continue home naltrexone -CIWA assessment q6 hours  FEN/GI: -Clear liquid diet after EGD -No IVF, electrolytes within normal limits  VTE Prophylaxis: SCD's while in bed, ambulate as tolerated Code Status: Full  Dispo: Anticipated discharge today.  Rozann Lesches, MD 08/07/2017, 6:29 AM Pager: [phone]

## 2017-08-07 NOTE — Progress Notes (Signed)
[facility]ology Progress Note  [name] Phillips 62 y.o. July 08, 1955  CC:  GI bleed/coffee ground emesis   Subjective: Patient denied any further bleeding episodes. Denied black stool or bright blood per rectum. Denied nausea or vomiting.  ROS : Negative for chest pain and shortness of breath.   Objective: Vital signs in last 24 hours: Vitals:   08/07/17 0409 08/07/17 0712  BP: 122/83 116/81  Pulse: 64 (!) 55  Resp: 20 18  Temp: 98.1 F (36.7 C) 98.2 F (36.8 C)  SpO2: 99% 97%    Physical Exam:  General:  Alert, cooperative, no distress, appears stated age  Head:  Normocephalic, without obvious abnormality, atraumatic  Eyes:  , EOM's intact,   Lungs:   Clear to auscultation bilaterally, respirations unlabored  Heart:  Regular rate and rhythm, S1, S2 normal  Abdomen:   Soft, non-tender, bowel sounds active all four quadrants,  no masses,   Extremities: Extremities normal, atraumatic, no  edema       Lab Results: Recent Labs    08/05/17 0244 08/06/17 0314 08/07/17 0303  NA 136 135 136  K 4.9 4.3 4.3  CL 111 107 106  CO2 20* 22 24  GLUCOSE 126* 127* 120*  BUN 59* 34* 26*  CREATININE 1.37* 1.26* 1.41*  CALCIUM 8.0* 8.3* 8.4*  MG 1.8  --   --    No results for input(s): AST, ALT, ALKPHOS, BILITOT, PROT, ALBUMIN in the last 72 hours. Recent Labs    08/06/17 0314 08/07/17 0303  WBC 4.8 7.2  HGB 7.5* 7.9*  HCT 22.1* 23.7*  MCV 92.9 94.0  PLT 81* 97*   No results for input(s): LABPROT, INR in the last 72 hours.    Assessment/Plan: - Coffee-ground emesis and melena. EGD yesterday showed 2 small clean-based ulcer. No evidence of active bleeding. Biopsies pending - Acute blood loss anemia. Hemoglobin stable - Thrombocytopenia. Patient with normal LFTs. Normal INR. Less likely from cirrhosis but possible given his alcohol use - Recurrent stroke with the left sided weakness. Plavix currently on hold - Family history of colon  cancer.  Recommendations ---------------------------- - No further bleeding episodes. Hemoglobin stable. - Continue IV twice a day PPI while in the hospital. Patient will need oral twice a day PPI for next 8 weeks. - Okay to resume Plavix from Saturday 08/09/2017  - Repeat EGD in 8 weeks to document healing of gastric ulcers. - Okay to discharge home from GI standpoint. - Follow-up in GI clinic in 6 weeks. - Consider outpatient virtual colonoscopy for colon cancer screening. - Avoid NSAIDs. - GI will sign off. Call us back if needed  Kathi Der MD, FACP 08/07/2017, 12:34 PM  Contact #  [phone]

## 2017-08-07 NOTE — Plan of Care (Signed)
Continue current care plan 

## 2017-08-07 NOTE — Progress Notes (Signed)
Subjective: [name] Phillips is a 62 year old male who presented to the hospital with 2 episodes of hematemesis. He reports some throat discomfort overnight and LUQ tenderness. He describes the pain as sharp and rates it as a 4-6/10. He also mentions that he had a large meal last night for the first time during his admission, including black coffee. The medical team spoke to [name] Phillips about eliminating NSAID use after discharge, including all OCT Goody powders, etc. He has been requesting throat lozenges, which have been helping with throat discomfort/pain. Pt denies any chest pain, SOB, dizziness, nausea, vomiting. He has not had a bowel movement since his last dark stool a few days ago. Endorses headache d/t lack of sleep.  Objective: Vital signs in last 24 hours: Vitals:   08/07/17 0014 08/07/17 0409 08/07/17 0712 08/07/17 1250  BP: 102/77 122/83 116/81 116/86  Pulse: 75 64 (!) 55 (!) 59  Resp: 15 20 18 12  Temp: 98.4 F (36.9 C) 98.1 F (36.7 C) 98.2 F (36.8 C) 98.1 F (36.7 C)  TempSrc: Oral Oral Oral Oral  SpO2: 99% 99% 97% 100%  Weight:      Height:       Weight change:   Intake/Output Summary (Last 24 hours) at 08/07/2017 1303 Last data filed at 08/07/2017 0940 Gross per 24 hour  Intake 1080 ml  Output -  Net 1080 ml   BP 116/86   Pulse (!) 59   Temp 98.1 F (36.7 C) (Oral)   Resp 12   Ht 6' 3" (1.905 m)   Wt 158 lb 4.6 oz (71.8 kg)   SpO2 100%   BMI 19.78 kg/m   General Appearance:    Alert, cooperative, sitting upright in bed in no distress, appears stated age  Head:    Normocephalic, without obvious abnormality, atraumatic  Throat:   Lips, mucosa, and tongue normal; teeth and gums normal  Back:     Symmetric, no curvature, ROM normal, no CVA tenderness  Lungs:     Clear to auscultation bilaterally, respirations unlabored  Chest wall:    No tenderness or deformity  Heart:    Regular rate and rhythm, S1 and S2 normal, no murmur, rub   or gallop  Abdomen:      Soft, slightly tender to palpation at LUQ, bowel sounds active all four quadrants, no masses, no organomegaly  Extremities:   Extremities normal, atraumatic, no cyanosis or edema   Lab Results: Recent Results:  CBC     Status: Abnormal   Collection Time: 08/07/17  3:03 AM  Result Value Ref Range   WBC 7.2 4.0 - 10.5 K/uL   RBC 2.52 (L) 4.22 - 5.81 MIL/uL   Hemoglobin 7.9 (L) 13.0 - 17.0 g/dL   HCT 23.7 (L) 39.0 - 52.0 %   MCV 94.0 78.0 - 100.0 fL   MCH 31.3 26.0 - 34.0 pg   MCHC 33.3 30.0 - 36.0 g/dL   RDW 15.2 11.5 - 15.5 %   Platelets 97 (L) 150 - 400 K/uL    Comment: CONSISTENT WITH PREVIOUS RESULT Performed at [facility], [address] [place], [place] [address]   Basic metabolic panel     Status: Abnormal   Collection Time: 08/07/17  3:03 AM  Result Value Ref Range   Sodium 136 135 - 145 mmol/L   Potassium 4.3 3.5 - 5.1 mmol/L   Chloride 106 101 - 111 mmol/L   CO2 24 22 - 32   mmol/L   Glucose, Bld 120 (H) 65 - 99 mg/dL   BUN 26 (H) 6 - 20 mg/dL   Creatinine, Ser 1.41 (H) 0.61 - 1.24 mg/dL   Calcium 8.4 (L) 8.9 - 10.3 mg/dL   GFR calc non Af Amer 51 (L) >60 mL/min   GFR calc Af Amer 59 (L) >60 mL/min    Comment: (NOTE) The eGFR has been calculated using the CKD EPI equation. This calculation has not been validated in all clinical situations. eGFR's persistently <60 mL/min signify possible Chronic Kidney Disease.    Anion gap 6 5 - 15    Comment: Performed at [facility], [address] [place], [place] [address]   Micro Results: Recent Results (from the past 240 hour(s))  MRSA PCR Screening     Status: None   Collection Time: 08/04/17  8:37 PM  Result Value Ref Range Status   MRSA by PCR NEGATIVE NEGATIVE Final    Comment:        The GeneXpert MRSA Assay (FDA approved for NASAL specimens only), is one component of a comprehensive MRSA colonization surveillance program. It is not intended to diagnose MRSA infection nor to guide or monitor  treatment for MRSA infections. Performed at [facility], [address] [place], [place] [address]    Studies/Results: Us Abdomen Limited Ruq  Result Date: 08/06/2017 CLINICAL DATA:  Liver disease EXAM: ULTRASOUND ABDOMEN LIMITED RIGHT UPPER QUADRANT COMPARISON:  None. FINDINGS: Gallbladder: Dependent sludge. No visualized stone. No wall thickening or focal tenderness. Common bile duct: Partially seen. Diameter: 7 mm, upper normal. Where visualized, no filling defect. Negative biliary labs. Liver: No focal lesion identified. Within normal limits in parenchymal echogenicity. Portal vein is patent on color Doppler imaging with normal direction of blood flow towards the liver. IMPRESSION: 1. Negative visualized liver. 2. Gallbladder sludge. Electronically Signed   By: Jonathon  Watts M.D.   On: 08/06/2017 08:00   Medications: I have reviewed the patient's current medications. Scheduled Meds: . folic acid  1 mg Intravenous Daily  . naltrexone  100 mg Oral Daily  . pantoprazole  40 mg Intravenous Q12H  . thiamine  100 mg Intravenous Daily   Continuous Infusions: PRN Meds:.acetaminophen **OR** acetaminophen, LORazepam, menthol-cetylpyridinium, senna-docusate Assessment/Plan: Active Problems:   Alcohol abuse   HTN (hypertension)   Systolic CHF (HCC)   History of CVA (cerebrovascular accident)   Upper GI bleed   Acute blood loss anemia   Thrombocytopenia (HCC)  [name] Phillips is a 62 y.o. Male with PMH of HFrEF s/p ICD (last echo, 2015, LVEF 25-30%), HTN, hx of CVA on daily Plavix, and alcohol use disorder on Naltrexone who p/w two episodes of hematemesis after starting NSAID therapy for supportive care of L rib fracture, likely from 2 clean-based gastric ulcers and mild gastritis found on EGD. Patient is stable with no active bleeding. Recent hemoglobin has stabilized at 7.9 (up from 7.5).   #Acute Blood loss anemia 2/2 upper GI bleed. Patient had two episodes of hematemesis on 03/10 and  presented on admission with a Hgb of 8.6 (down from 12.5 on 06/06/17). Also presented with an elevated BUN:Cr ratio (>20:1), suggesting acute GI bleed. Patient was known to have persistent alcohol consumption, although no evidence of cirrhosis, LFTs are normal. Pt initially started on IV PPI drip, octreotide, and ceftriaxone for suspicion of variceal bleed. Recently on EGD, patient was found to have two clean-based gastric ulcers and acute gastritis, prompting a d/c of octreotide and   ceftriaxone. GI consulting with much appreciated recs.  - Continue IV Pantoprazole 40 mg BID - Continue soft diet - Discharge with oral PPI for 8 weeks  - Repeat EGD in 8 weeks - Resume Clopidogrel after 3 days - Avoid NSAIDs  #Alcohol Use Disorder. Patient reports drinking up to 3-4 24oz cans of beer nightly. Patient on naltrexone at home, continuing to use alcohol regularly. Patient confirms understanding of eliminating alcohol intake to avoid further complications.  - Continue home Naltrexone - CIWA with Lorazepam - MVI, Thiamine, Folate  #AKI. Cr 1.68 on admission, baseline ~0.9-1.2. Currently Cr at 1.41, BUN 26. Patient was provided with 1.5L IVF on admission.  - Repeat BMP - Avoid nephrotoxic agents  #HTN and HFrEF s/p ICD.Patient currently asymptomatically hypotensive without tachycardia. No signs of acute CHF on physical exam. Patient home regimen includes Ramipril 63m BID, Carvedilol 24mBID, and Spironolactone 2539maily.  - Hold home Ramipril, Carvedilol, Spironolactone d/t to concern for AKI - Continue Atorvastatin 57m64mily  FEN/GI:  - Start soft diet. Currently NPO. - No IVF, electrolytes within normal limits  VTE Prophylaxis: SCDs while in bed, ambulate as tolerated Code Status: Full  Disposition: Today 03/14  This is a Medical Student Note.  The care of the patient was discussed with Dr. NedrBerneice Gandy the assessment and plan formulated with their assistance.  Please see their attached  note for official documentation of the daily encounter.   LOS: 3 days   GhodToy Cookeydical Student 08/07/2017, 1:03 PM

## 2017-08-07 NOTE — Progress Notes (Signed)
Explained and discussed discharge instructions, prescriptions, f/u appointments given to pt. Iv d/c'd. No complaints voiced at this time.

## 2017-08-07 NOTE — Discharge Summary (Signed)
Name: JHOVANI ARMACOST MRN: [id number] DOB: April 17, 1956 62 y.o. PCP: Fuller Plan, MD  Date of Admission: 08/03/2017 11:04 PM Date of Discharge: 08/07/2017 Attending Physician: Gust Rung, DO  Discharge Diagnosis:  Active Problems:   Alcohol abuse   HTN (hypertension)   Systolic CHF (HCC)   History of CVA (cerebrovascular accident)   Upper GI bleed   Acute blood loss anemia   Thrombocytopenia (HCC)  Discharge Medications: Allergies as of 08/07/2017      Reactions   Codeine Hives   Penicillins Hives, Itching   Has patient had a PCN reaction causing immediate rash, facial/tongue/throat swelling, SOB or lightheadedness with hypotension: unknown Has patient had a PCN reaction causing severe rash involving mucus membranes or skin necrosis: unknown Has patient had a PCN reaction that required hospitalization: unknown Has patient had a PCN reaction occurring within the last 10 years: no If all of the above answers are "NO", then may proceed with Cephalosporin use.   Prednisone Hives      Medication List    STOP taking these medications   ibuprofen 400 MG tablet Commonly known as:  ADVIL,MOTRIN     TAKE these medications   acetaminophen 325 MG tablet Commonly known as:  TYLENOL Take 2 tablets (650 mg total) by mouth every 6 (six) hours as needed. Do not take more than 4000mg  of tylenol per day   atorvastatin 20 MG tablet Commonly known as:  LIPITOR Take 1 tablet (20 mg total) by mouth daily.   carvedilol 25 MG tablet Commonly known as:  COREG TAKE 1 TABLET BY MOUTH TWO  TIMES DAILY WITH MEALS   clopidogrel 75 MG tablet Commonly known as:  PLAVIX Take 1 tablet (75 mg total) by mouth daily with breakfast. Start taking on:  08/10/2017 What changed:  These instructions start on 08/10/2017. If you are unsure what to do until then, ask your doctor or other care provider.   folic acid 1 MG tablet Commonly known as:  FOLVITE Take 1 tablet (1 mg total) by mouth  daily.   multivitamin with minerals Tabs tablet Take 1 tablet by mouth daily.   naltrexone 50 MG tablet Commonly known as:  DEPADE Take 2 tablets (100 mg total) by mouth daily.   pantoprazole 40 MG tablet Commonly known as:  PROTONIX Take 1 tablet (40 mg total) by mouth 2 (two) times daily.   ramipril 10 MG capsule Commonly known as:  ALTACE TAKE 1 CAPSULE BY MOUTH TWO TIMES DAILY   spironolactone 25 MG tablet Commonly known as:  ALDACTONE Take 1 tablet (25 mg total) by mouth daily.   thiamine 100 MG tablet Commonly known as:  VITAMIN B-1 Take 1 tablet (100 mg total) by mouth daily.      Disposition and follow-up:   Mr.Edder W Maule was discharged from [facility] in Stable condition.  At the hospital follow up visit please address:  1.  Patient presented with large volume hematemesis causing acute blood loss anemia. He required 2 units pRBCs during hospitalization and Hgb stable around 7.9 on discharge. Patient had 2 non-bleeding gastric ulcers on EGD and instructed to take BID PPI for 8 weeks upon discharge and to abstain from daily alcohol use. He is to follow up with GI for repeat EKG 8 weeks after discharge. Please assess compliance with this medication regimen and facilitate follow up.  2.  Labs / imaging needed at time of follow-up: CBC to assess hemoglobin, BMP to assess Cr  3.  Pending labs/ test needing follow-up: GI ulcer biopsies from EGD  Follow-up Appointments: Follow-up Information    Brahmbhatt, Parag, MD. Schedule an appointment as soon as possible for a visit in 6 week(s).   Specialty:  Gastroenterology Contact information: [address] [place] [place] [address] [phone]        Fuller Plan, MD. Call in 1 day(s).   Specialty:  Internal Medicine Contact information: [address] [place] [address] [phone]          Hospital Course by problem list: Active Problems:   Alcohol abuse   HTN  (hypertension)   Systolic CHF (HCC)   History of CVA (cerebrovascular accident)   Upper GI bleed   Acute blood loss anemia   Thrombocytopenia (HCC)   Yaser Paavola is a 62yo male with PMH of HFrEF s/p ICD (last echo 2015, LVEF 25-30%), HTN, hx of CVA on daily Plavix, and alcohol use disorder on naltrexone who presented for evaluation of hematemesis after recently starting NSAID therapy for supportive care of L rib fracture. Patient admitted to the internal medicine teaching service with GI consulting. The specific problems addressed during admission are as follows:   Acute blood loss anemia 2/2 upper GI bleed: Patient initially presented to ED after two episodes of large volume hematemesis at home. Patient reported recent ED visit for L sided rib fractures, where he was instructed to manage pain with high dose ibuprofen (800 mg QID). The patient endorsed a history of alcohol use disorder (see below) and admitted to daily alcohol use, stating he drank up to four 24 ounce beers daily, while taking daily NSAIDs for L rib pain. He had been drinking alcohol and using high dose ibuprofen for about 7-8 days prior to admission. Upon arrival to the ED the patient was found to have a Hgb= 8.6, which represented significant drop from previous recording of Hgb= 12.5 on 06/06/2017, and was FOBT +. He was started on IV protonix BID and home plavix was held given concern for ongoing GI bleeding. Although the patient did not endorse a history of cirrhosis, there was concern for variceal bleeding given significant blood loss that was present on admission and reported long history of alcohol abuse. As a result the patient was also started on ceftriaxone and octreotide until EGD could be performed to definitively rule out variceal bleeding. EGD was initially delayed 2/2 recent plavix use but was ultimately performed on HD #2. This study revealed mild gastritis and two non bleeding ulcers that were biopsied (pending on  discharge). After this study was completed the octreotide and ceftriaxone were discontinued. The patient was told to stay on PPI BID for 8 weeks and to resume home plavix 2 days after discharge. Over the course of his hospitalization the patient received a total of 2 units of pRBCs and Hgb remained stable around 7.5-7.9 for 48 hours prior to discharge. The patient was to follow up with GI as an outpatient in 6-8 weeks for repeat EGD to ensure healing of gastric ulcers. Please obtain CBC at hospital followup and ensure that patient has scheduled GI follow up for this repeat study.  Hx of HTN and HFrEF s/p ICD (last echo 2015 LVEF 25-30%): Patient was initially hypotensive upon arrival and endorsed some signs/symptoms of orthostasic hypotension. Given this, the patient received IVF and his home medications of ramipril, carvedilol, and spironolactone where held during admission. There were no signs or symptoms of acute heart failure exacerbation during  admission. The patient was normotensive on day of discharge and instructed to resume home medications as previously instructed by primary care physicians.  AKI: On admission patient was hypotensive and his Cr was elevated to 1.68, with a historical baseline of 0.9-1.2. His BUN:Cr ratio elevated >20, suggesting prerenal cause of his acute kidney injury. Patient's Cr improved with IVF during hospitalization and returned to baseline of 1.2 on HD #1. On discharge (the day after EGD) the patient's Cr was 1.4. He was instructed to continue PO intake, as he was made NPO for the procedure and was likely volume depleted as a result. Please reassess kidney function at follow up.  Hx of CVA: Patient on Pavix 75 mg and atorvastatin 20 mg for history of stroke. Atorvastatin was maintained during hospitalization, but Plavix held in setting of acute GI bleed as discussed above. Patient was to resume home Plavix two days after discharge. Please ensure patient followed these  instructions.   Alcohol use disorder: Patient reported drinking up to four 24 ounce beers daily on admission, in spite of daily naltrexone 100 mg use. He also denied history of alcohol withdrawal/seizures. The patient was placed on CIWA protocol and maintained scores <4 throughout admission. He did not require ativan during hospitalization. The patient was instructed to discontinue daily alcohol use as this could eventually lead to cirrhosis in the long term and acutely exacerbate gastritis in the short term.   Discharge Vitals:   BP 116/86   Pulse (!) 59   Temp 98.1 F (36.7 C) (Oral)   Resp 12   Ht 6\' 3"  (1.905 m)   Wt 158 lb 4.6 oz (71.8 kg)   SpO2 100%   BMI 19.78 kg/m   Pertinent Labs, Studies, and Procedures:  BMP Latest Ref Rng & Units 08/07/2017 08/06/2017 08/05/2017  Glucose 65 - 99 mg/dL 960(A) 540(J) 811(B)  BUN 6 - 20 mg/dL 14(N) 82(N) 56(O)  Creatinine 0.61 - 1.24 mg/dL 1.30(Q) 6.57(Q) 4.69(G)  BUN/Creat Ratio 10 - 24 - - -  Sodium 135 - 145 mmol/L 136 135 136  Potassium 3.5 - 5.1 mmol/L 4.3 4.3 4.9  Chloride 101 - 111 mmol/L 106 107 111  CO2 22 - 32 mmol/L 24 22 20(L)  Calcium 8.9 - 10.3 mg/dL 2.9(B) 8.3(L) 8.0(L)   CBC Latest Ref Rng & Units 08/07/2017 08/06/2017 08/05/2017  WBC 4.0 - 10.5 K/uL 7.2 4.8 5.6  Hemoglobin 13.0 - 17.0 g/dL 7.9(L) 7.5(L) 7.7(L)  Hematocrit 39.0 - 52.0 % 23.7(L) 22.1(L) 23.3(L)  Platelets 150 - 400 K/uL 97(L) 81(L) 83(L)   Acute Abdominal Series, 08/03/2017 FINDINGS: Cardiac shadow is within normal limits. Defibrillator is again noted and stable. The lungs are well aerated bilaterally without focal infiltrate.  Scattered large and small bowel gas is noted. Mild retained fecal material is seen. No free air is noted. No abnormal mass or abnormal calcifications are noted. Degenerative changes of the lumbar spine are seen.  IMPRESSION: No acute abnormality noted.  RUQ ultrasound, 08/06/2017 FINDINGS: Gallbladder: Dependent sludge. No  visualized stone. No wall thickening or focal tenderness.  Common bile duct: Partially seen. Diameter: 7 mm, upper normal. Where visualized, no filling defect. Negative biliary labs.  Liver: No focal lesion identified. Within normal limits in parenchymal echogenicity. Portal vein is patent on color Doppler imaging with normal direction of blood flow towards the liver.  IMPRESSION: 1. Negative visualized liver. 2. Gallbladder sludge.  Upper Endoscopy, 08/06/2017 FINDINGS: A small hiatal hernia was present. Two small non-bleeding superficial gastric ulcers ,  one with pigmented material were found on the lesser curvature of the gastric body. Scattered minimal inflammation was found in the gastric antrum. Biopsies were taken with a cold forceps for histology. The cardia and gastric fundus were normal on retroflexion. The duodenal bulb, first portion of the duodenum and second portion of the duodenum were normal.  IMPRESSION: - Normal mucosa was found in the entire esophagus. - Small hiatal hernia. - Non-bleeding gastric ulcers with pigmented material. - Gastritis. Biopsied. - Normal duodenal bulb, first portion of the duodenum and second portion of the duodenum.  Discharge Instructions: Discharge Instructions    Call MD for:  persistant dizziness or light-headedness   Complete by:  As directed    Call MD for:  persistant nausea and vomiting   Complete by:  As directed    Call MD for:  severe uncontrolled pain   Complete by:  As directed    Call MD for:  temperature >100.4   Complete by:  As directed    Diet - low sodium heart healthy   Complete by:  As directed    Discharge instructions   Complete by:  As directed    You were evaluated after an episode of vomiting blood. You were found to have two ulcers in your stomach that caused this bleeding. These ulcers were likely a result of increased ibuprofen use after a recent rib fracture. To prevent further bleeding in the  future, please avoid taking aspirin, ibuprofen, BC powder, or Goody's powder as all of these medicines can irritate the stomach and lead to ulcers.   To help with healing of the ulcers, you will need to take pantoprazole 40 mg twice a day. This medicine will help decrease your stomach acid and promote tissue healing. To prevent further bleeding, please hold your Plavix for 2 days after discharge. You can resume this medicine on Sunday August 10, 2016.   You will need to follow up as an outpatient in 8 weeks with GI for a repeat endoscopy to assess for healing of your known ulcers. Please also follow up with your primary care physician regarding your recent hospitalization. You will need to do this within a few days of leaving the hospital so they can check your blood counts and make sure that you are not bleeding again. Please call a physician if you have another episode of hematemesis or dark, black bowel movements.   Increase activity slowly   Complete by:  As directed       Signed: Rozann Lesches, MD 08/07/2017, 2:15 PM   Pager: [phone]

## 2017-08-11 ENCOUNTER — Other Ambulatory Visit: Payer: Self-pay | Admitting: *Deleted

## 2017-08-11 MED ORDER — PANTOPRAZOLE SODIUM 40 MG PO TBEC: 40.0000 mg | DELAYED_RELEASE_TABLET | Freq: Two times a day (BID) | ORAL | 0 refills | Status: DC

## 2017-08-14 ENCOUNTER — Other Ambulatory Visit: Payer: Self-pay | Admitting: Internal Medicine

## 2017-08-14 DIAGNOSIS — I10 Essential (primary) hypertension: Secondary | ICD-10-CM

## 2017-08-14 DIAGNOSIS — I428 Other cardiomyopathies: Secondary | ICD-10-CM

## 2017-08-19 ENCOUNTER — Encounter: Payer: Self-pay | Admitting: Internal Medicine

## 2017-08-19 ENCOUNTER — Other Ambulatory Visit: Payer: Self-pay

## 2017-08-19 ENCOUNTER — Ambulatory Visit (INDEPENDENT_AMBULATORY_CARE_PROVIDER_SITE_OTHER): Payer: Medicare HMO | Admitting: Internal Medicine

## 2017-08-19 VITALS — BP 111/76 | HR 65 | Temp 97.4°F | Ht 75.0 in | Wt 155.4 lb

## 2017-08-19 DIAGNOSIS — N179 Acute kidney failure, unspecified: Secondary | ICD-10-CM

## 2017-08-19 DIAGNOSIS — K922 Gastrointestinal hemorrhage, unspecified: Secondary | ICD-10-CM

## 2017-08-19 NOTE — Patient Instructions (Signed)
FOLLOW-UP INSTRUCTIONS When: Dr Dimple Casey in May For: General follow up What to bring: Medications  We are checking labs today to see how your blood counts are doing.  I will call you with any changes that need to be made.  Continue your medications for now as prescribed.   Please follow up with the stomach doctors regarding your ulcers.  Please try to cut back on your alcohol intake altogether.

## 2017-08-19 NOTE — Progress Notes (Signed)
CC: HFU for GI bleed, AKI  HPI:  [name] is a 62 y.o. man with a past medical history listed below here today for follow up of his GI bleed and AKI.   GI Bleed - Patient initially presented to ED after two episodes of large volume hematemesis at home. He had been taking high dose NSAID and drinking EtOH daily leading up to his admission.   Upon arrival to the ED the patient was found to have a Hgb 8.6, which represented significant drop from previous recording of Hgb 12.5 on 06/06/2017.  He was FOBT +. He was started on IV protonix BID and home plavix was held given concern for ongoing GI bleeding. EGD revealed mild gastritis and two non bleeding ulcers that were biopsied. The patient was told to stay on PPI BID for 8 weeks and to resume home plavix 2 days after discharge. Over the course of his hospitalization the patient received a total of 2 units of pRBCs and Hgb remained stable around 7.5-7.9 for 48 hours prior to discharge. The patient was to follow up with GI as an outpatient in 6-8 weeks for repeat EGD to ensure healing of gastric ulcers.  AKI - On admission patient was hypotensive and his Cr was elevated to 1.68, with a historical baseline of 0.9-1.2. His BUN:Cr ratio elevated >20, suggesting prerenal cause of his acute kidney injury. Patient's Cr improved with IVF during hospitalization and returned to baseline of 1.2 on HD #1. On discharge (the day after EGD) the patient's Cr was 1.4.   Hx CVA - Patient on Pavix 75 mg and atorvastatin 20 mg for history of stroke. Atorvastatin was maintained during hospitalization, but Plavix held in setting of acute GI bleed as discussed above. Patient was to resume home Plavix two days after discharge  Hx HFrEF - s/p ICD (last echo 2015 LVEF 25-30%). Patient was initially hypotensive upon arrival and endorsed some signs/symptoms of orthostasic hypotension. Given this, the patient received IVF and his home medications of ramipril, carvedilol, and  spironolactone where held during admission. There were no signs or symptoms of acute heart failure exacerbation during admission. The patient was normotensive on day of discharge and instructed to resume home medications.    For details of today's visit and the status of his chronic medical issues please refer to the assessment and plan.   Past Medical History:  Diagnosis Date  . AICD (automatic cardioverter/defibrillator) present 08/08/2011  . Alcohol abuse   . Arthritis   . CHF (congestive heart failure) (HCC)   . CVA (cerebral vascular accident) (HCC)   . Hypertension   . Nonischemic cardiomyopathy (HCC)    a. Catheterization 2005 normal coronary arteries; EF 15-20%; b. 2011 EF normal;  c. 2015 Echo: EF 25-30%;  d. s/p prior AICD with upgrade to MDT single lead ICD ser # [id number] h 2/2 ERI &12/19/2014).  . Seizure [facility])    Review of Systems:  Please see pertinent ROS reviewed in HPI and problem based charting.   Physical Exam:  Vitals:   08/19/17 1501  BP: 111/76  Pulse: 65  Temp: (!) 97.4 F (36.3 C)  TempSrc: Oral  SpO2: 100%  Weight: 155 lb 6.4 oz (70.5 kg)  Height: 6\' 3"  (1.905 m)   Physical Exam  Constitutional: He is oriented to person, place, and time and well-developed, well-nourished, and in no distress.  HENT:  Head: Normocephalic and atraumatic.  Cardiovascular: Normal rate and regular rhythm.  No murmur heard. Pulmonary/Chest: Effort  normal and breath sounds normal.  Abdominal: Soft. There is no tenderness. There is no rebound and no guarding.  Musculoskeletal: He exhibits no edema.  Neurological: He is alert and oriented to person, place, and time.  Psychiatric: Mood and affect normal.     Assessment & Plan:   See Encounters Tab for problem based charting.  Patient discussed with Dr. Rogelia Boga .  Upper GI bleed Patient presents for follow up today.  He has been doing well post hospitalization.  He has his protonix with him and states he received it in  the mail 2 days ago.  He has been taking as prescribed BID since it came.  He has not experienced any more bleeding.  He is drinking much less than he used to saying he practically "gives his beer away now" and it took him 2 days to drink a can of beer.  He has been taking his Naltrexone as prescribed as well for EtOH use disorder.  Plan: - CBC today shows hemoglobin of 8.9 improved from discharge - Continue PPI BID - Follow up with GI confirmed with patient and with the [facility] - Encouraged further abstinence from EtOH and NSAIDs.   Acute kidney injury (HCC) On admission patient was hypotensive and his Cr was elevated to 1.68, with a historical baseline of 0.9-1.2. His BUN:Cr ratio elevated >20, suggesting prerenal cause of his acute kidney injury. Patient's Cr improved with IVF during hospitalization and returned to baseline of 1.2 on HD #1. On discharge (the day after EGD) the patient's Cr was 1.4.   Plan: - BMET today with creatinine improved to about 1.3 near his historical baseline.

## 2017-08-20 ENCOUNTER — Encounter: Payer: Self-pay | Admitting: Internal Medicine

## 2017-08-20 DIAGNOSIS — N179 Acute kidney failure, unspecified: Secondary | ICD-10-CM

## 2017-08-20 HISTORY — DX: Acute kidney failure, unspecified: N17.9

## 2017-08-20 LAB — BMP8+ANION GAP
Anion Gap: 14 mmol/L (ref 10.0–18.0)
BUN/Creatinine Ratio: 12 (ref 10–24)
BUN: 17 mg/dL (ref 8–27)
CO2: 21 mmol/L (ref 20–29)
Calcium: 9.3 mg/dL (ref 8.6–10.2)
Chloride: 110 mmol/L — ABNORMAL HIGH (ref 96–106)
Creatinine, Ser: 1.38 mg/dL — ABNORMAL HIGH (ref 0.76–1.27)
GFR calc Af Amer: 62 mL/min/{1.73_m2} (ref >59)
GFR calc non Af Amer: 54 mL/min/{1.73_m2} — ABNORMAL LOW (ref >59)
Glucose: 95 mg/dL (ref 65–99)
Potassium: 4.6 mmol/L (ref 3.5–5.2)
Sodium: 145 mmol/L — ABNORMAL HIGH (ref 134–144)

## 2017-08-20 LAB — CBC
Hematocrit: 27.1 % — ABNORMAL LOW (ref 37.5–51.0)
Hemoglobin: 8.9 g/dL — ABNORMAL LOW (ref 13.0–17.7)
MCH: 31.7 pg (ref 26.6–33.0)
MCHC: 32.8 g/dL (ref 31.5–35.7)
MCV: 96 fL (ref 79–97)
Platelets: 227 10*3/uL (ref 150–379)
RBC: 2.81 x10E6/uL — ABNORMAL LOW (ref 4.14–5.80)
RDW: 16.9 % — ABNORMAL HIGH (ref 12.3–15.4)
WBC: 6.5 10*3/uL (ref 3.4–10.8)

## 2017-08-20 NOTE — Assessment & Plan Note (Signed)
Patient presents for follow up today.  He has been doing well post hospitalization.  He has his protonix with him and states he received it in the mail 2 days ago.  He has been taking as prescribed BID since it came.  He has not experienced any more bleeding.  He is drinking much less than he used to saying he practically "gives his beer away now" and it took him 2 days to drink a can of beer.  He has been taking his Naltrexone as prescribed as well for EtOH use disorder.  Plan: - CBC today shows hemoglobin of 8.9 improved from discharge - Continue PPI BID - Follow up with GI confirmed with patient and with the [facility] - Encouraged further abstinence from EtOH and NSAIDs.

## 2017-08-20 NOTE — Assessment & Plan Note (Signed)
On admission patient was hypotensive and his Cr was elevated to 1.68, with a historical baseline of 0.9-1.2. His BUN:Cr ratio elevated >20, suggesting prerenal cause of his acute kidney injury. Patient's Cr improved with IVF during hospitalization and returned to baseline of 1.2 on HD #1. On discharge (the day after EGD) the patient's Cr was 1.4.   Plan: - BMET today with creatinine improved to about 1.3 near his historical baseline.

## 2017-08-22 NOTE — Progress Notes (Signed)
Internal Medicine Clinic Attending  Case discussed with Dr. Wallace at the time of the visit.  We reviewed the resident's history and exam and pertinent patient test results.  I agree with the assessment, diagnosis, and plan of care documented in the resident's note.  

## 2017-09-05 ENCOUNTER — Encounter: Payer: Medicare HMO | Admitting: Internal Medicine

## 2017-09-22 ENCOUNTER — Other Ambulatory Visit: Payer: Self-pay | Admitting: Internal Medicine

## 2017-09-22 DIAGNOSIS — I10 Essential (primary) hypertension: Secondary | ICD-10-CM

## 2017-09-22 DIAGNOSIS — F1010 Alcohol abuse, uncomplicated: Secondary | ICD-10-CM

## 2017-09-23 NOTE — Telephone Encounter (Signed)
Next appt scheduled 5/3 with PCP. ?

## 2017-09-26 ENCOUNTER — Encounter: Payer: Medicare HMO | Admitting: Internal Medicine

## 2017-10-14 ENCOUNTER — Other Ambulatory Visit: Payer: Self-pay | Admitting: Internal Medicine

## 2017-10-28 ENCOUNTER — Encounter: Payer: Self-pay | Admitting: Gastroenterology

## 2017-11-11 ENCOUNTER — Ambulatory Visit: Payer: Medicare HMO | Admitting: Gastroenterology

## 2017-11-17 ENCOUNTER — Encounter: Payer: Self-pay | Admitting: Internal Medicine

## 2017-11-25 ENCOUNTER — Ambulatory Visit (INDEPENDENT_AMBULATORY_CARE_PROVIDER_SITE_OTHER): Payer: Medicare Other | Admitting: Internal Medicine

## 2017-11-25 ENCOUNTER — Other Ambulatory Visit: Payer: Self-pay

## 2017-11-25 ENCOUNTER — Ambulatory Visit (HOSPITAL_COMMUNITY)
Admission: RE | Admit: 2017-11-25 | Discharge: 2017-11-25 | Disposition: A | Discharge status: Home / Self Care | Payer: Medicare HMO | Source: Ambulatory Visit | Attending: Internal Medicine | Admitting: Internal Medicine

## 2017-11-25 ENCOUNTER — Telehealth: Payer: Self-pay | Admitting: Internal Medicine

## 2017-11-25 ENCOUNTER — Encounter: Payer: Self-pay | Admitting: Internal Medicine

## 2017-11-25 VITALS — BP 138/91 | HR 69 | Temp 98.0°F | Ht 74.0 in | Wt 160.7 lb

## 2017-11-25 DIAGNOSIS — G8911 Acute pain due to trauma: Secondary | ICD-10-CM

## 2017-11-25 DIAGNOSIS — Z87448 Personal history of other diseases of urinary system: Secondary | ICD-10-CM

## 2017-11-25 DIAGNOSIS — N179 Acute kidney failure, unspecified: Secondary | ICD-10-CM

## 2017-11-25 DIAGNOSIS — Y92009 Unspecified place in unspecified non-institutional (private) residence as the place of occurrence of the external cause: Secondary | ICD-10-CM

## 2017-11-25 DIAGNOSIS — Z029 Encounter for administrative examinations, unspecified: Secondary | ICD-10-CM | POA: Diagnosis present

## 2017-11-25 DIAGNOSIS — R0789 Other chest pain: Secondary | ICD-10-CM

## 2017-11-25 DIAGNOSIS — Z720 Tobacco use: Secondary | ICD-10-CM

## 2017-11-25 DIAGNOSIS — F1010 Alcohol abuse, uncomplicated: Secondary | ICD-10-CM

## 2017-11-25 DIAGNOSIS — M25512 Pain in left shoulder: Secondary | ICD-10-CM

## 2017-11-25 DIAGNOSIS — Z862 Personal history of diseases of the blood and blood-forming organs and certain disorders involving the immune mechanism: Secondary | ICD-10-CM

## 2017-11-25 DIAGNOSIS — Z8719 Personal history of other diseases of the digestive system: Secondary | ICD-10-CM

## 2017-11-25 DIAGNOSIS — I110 Hypertensive heart disease with heart failure: Secondary | ICD-10-CM | POA: Diagnosis not present

## 2017-11-25 DIAGNOSIS — D696 Thrombocytopenia, unspecified: Secondary | ICD-10-CM

## 2017-11-25 DIAGNOSIS — Z8673 Personal history of transient ischemic attack (TIA), and cerebral infarction without residual deficits: Secondary | ICD-10-CM

## 2017-11-25 DIAGNOSIS — I5020 Unspecified systolic (congestive) heart failure: Secondary | ICD-10-CM

## 2017-11-25 DIAGNOSIS — Z9181 History of falling: Secondary | ICD-10-CM

## 2017-11-25 DIAGNOSIS — W19XXXA Unspecified fall, initial encounter: Secondary | ICD-10-CM

## 2017-11-25 HISTORY — DX: Unspecified fall, initial encounter: W19.XXXA

## 2017-11-25 HISTORY — DX: Unspecified place in unspecified non-institutional (private) residence as the place of occurrence of the external cause: Y92.009

## 2017-11-25 LAB — BASIC METABOLIC PANEL
Anion gap: 5 (ref 5–15)
BUN: 10 mg/dL (ref 8–23)
CO2: 26 mmol/L (ref 22–32)
Calcium: 9.1 mg/dL (ref 8.9–10.3)
Chloride: 110 mmol/L (ref 98–111)
Creatinine, Ser: 1.2 mg/dL (ref 0.61–1.24)
GFR calc Af Amer: >60 mL/min (ref >60)
GFR calc non Af Amer: >60 mL/min (ref >60)
Glucose, Bld: 106 mg/dL — ABNORMAL HIGH (ref 70–99)
Potassium: 3.9 mmol/L (ref 3.5–5.1)
Sodium: 141 mmol/L (ref 135–145)

## 2017-11-25 LAB — CBC
HCT: 32.4 % — ABNORMAL LOW (ref 39.0–52.0)
Hemoglobin: 10.5 g/dL — ABNORMAL LOW (ref 13.0–17.0)
MCH: 30.3 pg (ref 26.0–34.0)
MCHC: 32.4 g/dL (ref 30.0–36.0)
MCV: 93.6 fL (ref 78.0–100.0)
Platelets: 150 10*3/uL (ref 150–400)
RBC: 3.46 MIL/uL — ABNORMAL LOW (ref 4.22–5.81)
RDW: 14.5 % (ref 11.5–15.5)
WBC: 6.2 10*3/uL (ref 4.0–10.5)

## 2017-11-25 NOTE — Assessment & Plan Note (Signed)
-   History of Upper GI bleed, anemia, and thrombocytopenia on last admission 07/2017 - Currently not complaining of fatigue, lightheadedness, dizziness - Will check cbc to assess platelet count considering history of recent falls

## 2017-11-25 NOTE — Progress Notes (Signed)
Internal Medicine Clinic Attending  I saw and evaluated the patient.  I personally confirmed the key portions of the history and exam documented by Dr. Nedra Hai and I reviewed pertinent patient test results.  The assessment, diagnosis, and plan were formulated together and I agree with the documentation in the resident's note. He does have tenderness in his left axilla space as noted. I prefromed a POCUS over the area, sliding lung noted, no obvious fracture on ultrasound, no obvious hematoma.  Agree with plan as noted.

## 2017-11-25 NOTE — Assessment & Plan Note (Signed)
-   Fall at home from standing position onto left side and shoulder 1 week ago - No outward evidence of bruising, but tenderness to deep palpation on physical exam - POC ultrasound reveals no obvious rib fracture - Will order X-ray on left ribs - Recommend OTC Tylenol w/ 3000mg  max daily for pain control

## 2017-11-25 NOTE — Assessment & Plan Note (Signed)
-   Past history of AKI on last admission (07/2017) - Creatinine 1.38 on 08/19/2017 - Pt asking about naproxen use for pain control - Will check current creatinine to assess kidney function

## 2017-11-25 NOTE — Patient Instructions (Addendum)
Hello [name]  You came to [place] with left chest pain after falling at home. We saw that you have a history of falls in the past and reviewed your old X-rays. We performed a point of care ultrasound to see if we can detect a fracture, but we were not able to. We are giving you an order for a new X-ray for your left ribs to rule out a broken rib. We are also ordering some blood tests to determine the status of your kidney function and blood levels considering your recent hospitalization. We will contact you with the results as soon as we get them.  Meanwhile, we recommend that you take over the counter Tylenol or Alleve for your pain as needed and avoid heavy lifting or strenuous activities. Please return to the hospital if you experience any significant shortness of breath, palpitations, chest pain.   Thank you for choosing [facility]  -Judeth Cornfield, MD

## 2017-11-25 NOTE — Progress Notes (Signed)
   CC: Assessment after Fall at Home  HPI: [name] is a 63 y.o. w/ PMH of HFrEF, CVA, HTN, Tobacco use, and EtOH abuse present to [facility] for evaluation after a fall. He was changing his curtains and fell after tripping on his speaker power cord onto his left shoulder and side on hardwood floor. Ever since, he has had soreness 5/10 on his left side that slowly improved with ice pack use. He states his pain is no longer present at rest but does come back with deep breathing. Denies any shortness of breath, hemoptysis, palpitations, headache, vertigo.  Past Medical History:  Diagnosis Date  . AICD (automatic cardioverter/defibrillator) present 08/08/2011  . Alcohol abuse   . Arthritis   . CHF (congestive heart failure) (HCC)   . CVA (cerebral vascular accident) (HCC)   . Hypertension   . Nonischemic cardiomyopathy (HCC)    a. Catheterization 2005 normal coronary arteries; EF 15-20%; b. 2011 EF normal;  c. 2015 Echo: EF 25-30%;  d. s/p prior AICD with upgrade to MDT single lead ICD ser # [id number] h 2/2 ERI &12/19/2014).  . Seizure (HCC)     Review of Systems: Review of Systems  Constitutional: Negative for chills, fever and malaise/fatigue.  Eyes: Negative for blurred vision and double vision.  Respiratory: Negative for cough, hemoptysis and shortness of breath.   Cardiovascular: Positive for chest pain. Negative for palpitations.  Gastrointestinal: Negative for abdominal pain, constipation, diarrhea, nausea and vomiting.  Musculoskeletal: Positive for falls. Negative for back pain, joint pain and neck pain.  Skin: Negative.        No bruising over the left chest wall  Neurological: Negative for dizziness, focal weakness and headaches.     Physical Exam: Vitals:   11/25/17 0836  BP: (!) 138/91  Pulse: 69  Temp: 98 F (36.7 C)  TempSrc: Oral    Physical Exam  Constitutional: He appears well-developed. No distress.  Thin-appearing  HENT:  Head: Atraumatic.  Right Ear:  External ear normal.  Left Ear: External ear normal.  Nose: Nose normal.  Mouth/Throat: Oropharynx is clear and moist.  Cardiovascular: Normal rate, regular rhythm and normal heart sounds.  Respiratory: Breath sounds normal. No respiratory distress. He has no wheezes. He exhibits tenderness (Tender to deep palpation on left chest wall right below axillary region).  Shallow breaths  Musculoskeletal: Normal range of motion. He exhibits no edema or deformity.  Skin: Skin is warm and dry.  No bruising. Negative for pale surfaces     Assessment & Plan:   See Encounters Tab for problem based charting.  Patient seen with Dr. Cleda Daub  -Judeth Cornfield, PGY 1

## 2017-11-25 NOTE — Assessment & Plan Note (Signed)
-   Patient has history of alcohol abuse - States fall was unrelated to alcohol use - Reports 1~2 drinks per week every since recent admission for GI bleed - States he has stopped taking his folic acid supplments - Will check CBC to rule out anemia

## 2017-11-25 NOTE — Telephone Encounter (Signed)
-  Spoke with patient at home number about his X-ray, CBC and BMP results - Patient understands that he does not have rib fracture - Patient understands his cbc and bmp results show improvement since his hospitalization - He had no further questions

## 2017-11-28 ENCOUNTER — Encounter: Payer: Self-pay | Admitting: Cardiology

## 2017-12-17 ENCOUNTER — Encounter: Payer: Self-pay | Admitting: *Deleted

## 2018-01-12 ENCOUNTER — Other Ambulatory Visit: Payer: Self-pay | Admitting: Internal Medicine

## 2018-01-12 DIAGNOSIS — I10 Essential (primary) hypertension: Secondary | ICD-10-CM

## 2018-01-12 DIAGNOSIS — I428 Other cardiomyopathies: Secondary | ICD-10-CM

## 2018-01-12 MED ORDER — CLOPIDOGREL BISULFATE 75 MG PO TABS: 75.0000 mg | ORAL_TABLET | Freq: Every day | ORAL | 1 refills | Status: DC

## 2018-01-12 MED ORDER — CARVEDILOL 25 MG PO TABS: ORAL_TABLET | ORAL | 1 refills | Status: DC

## 2018-01-12 MED ORDER — SPIRONOLACTONE 25 MG PO TABS: 25.0000 mg | ORAL_TABLET | Freq: Every day | ORAL | 1 refills | Status: DC

## 2018-01-12 MED ORDER — ATORVASTATIN CALCIUM 20 MG PO TABS: 20.0000 mg | ORAL_TABLET | Freq: Every day | ORAL | 1 refills | Status: DC

## 2018-01-12 MED ORDER — RAMIPRIL 10 MG PO CAPS: ORAL_CAPSULE | ORAL | 1 refills | Status: AC

## 2018-01-12 NOTE — Telephone Encounter (Signed)
Needs refill on atorvastatin (LIPITOR) 20 MG tablet, clopidogrel (PLAVIX) 75 MG tablet, spironolactone (ALDACTONE) 25 MG tablet, carvedilol (COREG) 25 MG tablet, Ensure @ [facility] # [phone]; pt contact # [phone]

## 2018-03-30 ENCOUNTER — Ambulatory Visit: Payer: Self-pay

## 2018-03-30 ENCOUNTER — Encounter: Payer: Self-pay | Admitting: Internal Medicine

## 2018-04-14 ENCOUNTER — Other Ambulatory Visit: Payer: Self-pay | Admitting: Internal Medicine

## 2018-04-15 NOTE — Telephone Encounter (Signed)
Yes [facility] should be ok Thanks

## 2018-04-15 NOTE — Telephone Encounter (Signed)
There is no open slot for Dr. Dortha Schwalbe, do you want the patient to come in [facility] instead. Please advise.

## 2018-04-17 ENCOUNTER — Encounter: Payer: Self-pay | Admitting: Internal Medicine

## 2018-04-17 ENCOUNTER — Other Ambulatory Visit: Payer: Self-pay

## 2018-04-17 ENCOUNTER — Ambulatory Visit (INDEPENDENT_AMBULATORY_CARE_PROVIDER_SITE_OTHER): Payer: Self-pay | Admitting: Internal Medicine

## 2018-04-17 VITALS — BP 106/78 | HR 70 | Temp 98.5°F | Ht 74.0 in | Wt 156.9 lb

## 2018-04-17 DIAGNOSIS — Z720 Tobacco use: Secondary | ICD-10-CM

## 2018-04-17 DIAGNOSIS — N529 Male erectile dysfunction, unspecified: Secondary | ICD-10-CM

## 2018-04-17 DIAGNOSIS — Z0000 Encounter for general adult medical examination without abnormal findings: Secondary | ICD-10-CM

## 2018-04-17 DIAGNOSIS — F1010 Alcohol abuse, uncomplicated: Secondary | ICD-10-CM

## 2018-04-17 DIAGNOSIS — Z79899 Other long term (current) drug therapy: Secondary | ICD-10-CM

## 2018-04-17 DIAGNOSIS — Z8719 Personal history of other diseases of the digestive system: Secondary | ICD-10-CM

## 2018-04-17 DIAGNOSIS — F5221 Male erectile disorder: Secondary | ICD-10-CM

## 2018-04-17 DIAGNOSIS — I10 Essential (primary) hypertension: Secondary | ICD-10-CM

## 2018-04-17 DIAGNOSIS — Z23 Encounter for immunization: Secondary | ICD-10-CM

## 2018-04-17 DIAGNOSIS — F17210 Nicotine dependence, cigarettes, uncomplicated: Secondary | ICD-10-CM

## 2018-04-17 MED ORDER — TADALAFIL 10 MG PO TABS: 10.0000 mg | ORAL_TABLET | ORAL | 1 refills | Status: DC | PRN

## 2018-04-17 NOTE — Assessment & Plan Note (Signed)
Patient is currently on naltrexone and he reports that it is helping his cravings.  He drank some EtOH this week however drinks a lot less then before, last bought 1/2 gallon about a 64month ago, but can still drink a 1/2 gallon in a week. Denies any melena, hematochezia, or hematemesis.   Plan: -Encouraged alcohol cessation -Continue naltrexone

## 2018-04-17 NOTE — Assessment & Plan Note (Signed)
Patient reports that he has decreased his smoking, he is only smoking about 1/4 pack/day now which is down from about a pack a day.  Encouraged patient to continue to decrease this amount.

## 2018-04-17 NOTE — Assessment & Plan Note (Signed)
In regards to his hypertension he has been taking carvedilol 25 mg twice daily, spironolactone 25 mg daily, and milligrams twice daily.  He denies any side effects to these medications.  He is taking medications with no issues.  Blood pressure today was at goal.  Plan: -Continue carvedilol 25 mg twice daily, spironolactone 25 mg daily and verapamil 10 mg twice daily

## 2018-04-17 NOTE — Patient Instructions (Addendum)
Thank you for allowing [place] to provide your care today. Today we discussed your medical issues.     I have sent in a prescription for Cialis, take this 30 minutes before the anticipated sexual activity. Do NOT take more then once a day. If you start having worsening light headedness or dizziness please stop taking it and let us know.    I have placed a referral for a colonoscopy.  Today we made no changes to your medications.    Please follow-up in 6 months.    Should you have any questions or concerns please call the internal medicine clinic at [phone].

## 2018-04-17 NOTE — Assessment & Plan Note (Addendum)
He reports that he has had issues with erectile dysfunction for about 14 years. Is not able to maintain or sustain and erection. Does not have any morning erections. Not currently sexually active. He has tried viagra but he reports that this does not help.  He does report some occasional dizziness when he stands up too quickly denies any episodes of syncope.  He denied any significant headaches, vision issues, issues with his size shape of his testicles, or recent hair loss.  He was wondering about Cialis and if he was able to start that.  Plan: -Trial of Cialis

## 2018-04-17 NOTE — Assessment & Plan Note (Signed)
He is requesting a flu shot. He also has never had a colonoscopy.  Flu shot given and referral for colonoscopy placed.

## 2018-04-17 NOTE — Progress Notes (Signed)
CC: Follow up of HTN, Erectile dysfunction and health maintenance   HPI:  [name] is a 62 y.o.  with a PMH listed below presenting for hypertension, erectile dysfunction and health maintenance issues.  In regards to his hypertension he has been taking carvedilol 25 mg twice daily, spironolactone 25 mg daily, and milligrams twice daily.  He denies any side effects to these medications.  He is taking medications with no issues.  Blood pressure today was at goal.  He reports that he has had issues with erectile dysfunction for about 14 years. Is not able to maintain or sustain and erection. Does not have any morning erections. Not currently sexually active. He has tried viagra but he reports that this does not help.  He does report some occasional dizziness when he stands up too quickly denies any episodes of syncope.  He denied any significant headaches, vision issues, issues with his size shape of his testicles, or recent hair loss.  He was wondering about Cialis and if he was able to start that.  He reports that he is not having any other issues today, denies any fevers, nausea, vomiting, diarrhea, patient, chest pain, abdominal pain or other issues.  He is requesting a flu shot.  He has a history of prior GI bleed, has not followed up with GI yet.  He also has never had a colonoscopy.  Patient is currently on naltrexone and he reports that it is helping his cravings.  He drank some EtOH this week however drinks a lot less then before, last bought 1/2 gallon about a 3month ago, but can drink a 1/2 gallon in a week. Denies any melena, hematochezia, or hematemesis.  Please see A&P for status of the patient's chronic medical conditions   Past Medical History:  Diagnosis Date  . AICD (automatic cardioverter/defibrillator) present 08/08/2011  . Alcohol abuse   . Arthritis   . CHF (congestive heart failure) (HCC)   . CVA (cerebral vascular accident) (HCC)   . Hypertension   .  Nonischemic cardiomyopathy (HCC)    a. Catheterization 2005 normal coronary arteries; EF 15-20%; b. 2011 EF normal;  c. 2015 Echo: EF 25-30%;  d. s/p prior AICD with upgrade to MDT single lead ICD ser # [id number] h 2/2 ERI &12/19/2014).  . Seizure [facility])    Review of Systems: Refer to history of present illness and assessment and plans for pertinent review of systems, all others reviewed and negative.  Physical Exam:  Vitals:   04/17/18 1031  BP: 106/78  Pulse: 70  Temp: 98.5 F (36.9 C)  TempSrc: Oral  SpO2: 100%  Weight: 156 lb 14.4 oz (71.2 kg)  Height: 6\' 2"  (1.88 m)    Physical Exam  Constitutional: He is oriented to person, place, and time and well-developed, well-nourished, and in no distress.  HENT:  Head: Normocephalic and atraumatic.  Eyes: Pupils are equal, round, and reactive to light. Conjunctivae and EOM are normal.  Neck: Normal range of motion. Neck supple. No thyromegaly present.  Cardiovascular: Normal rate, regular rhythm and normal heart sounds. Exam reveals no gallop and no friction rub.  No murmur heard. Pulmonary/Chest: Effort normal and breath sounds normal. No respiratory distress. He has no wheezes.  Abdominal: Soft. Bowel sounds are normal. He exhibits no distension.  Genitourinary: Penis normal. He exhibits no abnormal testicular mass and no testicular tenderness. No discharge found.  Musculoskeletal: Normal range of motion.  Neurological: He is alert and oriented to person, place, and time.  Gait normal.  Skin: Skin is warm and dry. No erythema.  Psychiatric: Mood and affect normal.    Social History   Socioeconomic History  . Marital status: Married    Spouse name: Not on file  . Number of children: Not on file  . Years of education: Not on file  . Highest education level: Not on file  Occupational History  . Not on file  Social Needs  . Financial resource strain: Not on file  . Food insecurity:    Worry: Not on file    Inability: Not on  file  . Transportation needs:    Medical: Not on file    Non-medical: Not on file  Tobacco Use  . Smoking status: Current Every Day Smoker    Packs/day: 0.25    Years: 39.00    Pack years: 9.75    Types: Cigarettes  . Smokeless tobacco: Former Neurosurgeon    Types: Snuff, Chew  . Tobacco comment: Smoking .25  Smokes cigers   Substance and Sexual Activity  . Alcohol use: Yes    Alcohol/week: 3.0 standard drinks    Types: 3 Cans of beer per week    Comment: 2x/month  . Drug use: No    Comment: Former crack cocaine use for several years, last 9 years ago  . Sexual activity: Not on file  Lifestyle  . Physical activity:    Days per week: Not on file    Minutes per session: Not on file  . Stress: Not on file  Relationships  . Social connections:    Talks on phone: Not on file    Gets together: Not on file    Attends religious service: Not on file    Active member of club or organization: Not on file    Attends meetings of clubs or organizations: Not on file    Relationship status: Not on file  . Intimate partner violence:    Fear of current or ex partner: Not on file    Emotionally abused: Not on file    Physically abused: Not on file    Forced sexual activity: Not on file  Other Topics Concern  . Not on file  Social History Narrative  . Not on file   Family History  Problem Relation Age of Onset  . Heart disease Father   . Thyroid cancer Mother   . Hypertension Unknown   . Diabetes type II Sister   . Seizures Brother   . Lupus Sister     Assessment & Plan:   See Encounters Tab for problem based charting.  Patient seen with Dr. Oswaldo Done

## 2018-04-20 ENCOUNTER — Encounter: Payer: Self-pay | Admitting: *Deleted

## 2018-04-20 NOTE — Progress Notes (Signed)
Internal Medicine Clinic Attending  I saw and evaluated the patient.  I personally confirmed the key portions of the history and exam documented by Dr. Krienke and I reviewed pertinent patient test results.  The assessment, diagnosis, and plan were formulated together and I agree with the documentation in the resident's note.    

## 2018-04-20 NOTE — Addendum Note (Signed)
Addended by: Erlinda Hong T on: 04/20/2018 01:23 PM   Modules accepted: Level of Service

## 2018-04-30 ENCOUNTER — Telehealth: Payer: Self-pay | Admitting: *Deleted

## 2018-04-30 NOTE — Telephone Encounter (Signed)
CALLED  PATIENT, LVM FOR PATIENT TO RETURN CALL REGARDING GI REFERRAL. IF HE IS ABLE TO PAY OUT OF POCKET / NEEDS TO SEE FC ([facility]).

## 2018-05-13 ENCOUNTER — Telehealth: Payer: Self-pay

## 2018-05-13 NOTE — Telephone Encounter (Signed)
Rtc, message stated mailbox full, will await pt rtc

## 2018-05-13 NOTE — Telephone Encounter (Signed)
Needs to speak with a nurse about tadalafil (CIALIS) 10 MG tablet.

## 2018-05-15 ENCOUNTER — Encounter: Payer: Self-pay | Admitting: *Deleted

## 2018-05-15 ENCOUNTER — Telehealth: Payer: Self-pay | Admitting: *Deleted

## 2018-05-15 NOTE — Telephone Encounter (Signed)
Ret call to pt, he is upset that he has not automatically gotten a refill on cialis. Ask pt if he called his pharm he states no, then he states he does not know which pharm he gets the cialis from. Informed he has a script at [place] he states he uses [facility] also. He was informed that he cant use both. Called [facility], they have script needed PA, answered questions, informed pt would either need to wait for ins answer or pay out of pocket, he ask nurse to call and find cheapest places for paying cash, pt told he must do this. He was not happy but agreed. Informed gladysh. That PA had started  Reference # [id number] ph# to check progress [phone]

## 2018-05-15 NOTE — Telephone Encounter (Signed)
SPOKE WITH PATIENT REGARDING HIS GI REFERRAL WIT [facility].  PATIENT STATES WOULD LIKE GI OFFICE TO CALL HIM REGARDING BILL. I WILL CONTACT OFFICE TO HAVE FINANCIAL OFFICE TO CALL. PATIENT.

## 2018-05-22 ENCOUNTER — Emergency Department (HOSPITAL_COMMUNITY)
Admission: EM | Admit: 2018-05-22 | Discharge: 2018-05-23 | Disposition: A | Discharge status: Home / Self Care | Payer: Medicare HMO | Attending: Emergency Medicine | Admitting: Emergency Medicine

## 2018-05-22 ENCOUNTER — Encounter (HOSPITAL_COMMUNITY): Payer: Self-pay | Admitting: Emergency Medicine

## 2018-05-22 DIAGNOSIS — K047 Periapical abscess without sinus: Secondary | ICD-10-CM | POA: Diagnosis not present

## 2018-05-22 DIAGNOSIS — Z79899 Other long term (current) drug therapy: Secondary | ICD-10-CM | POA: Insufficient documentation

## 2018-05-22 DIAGNOSIS — I10 Essential (primary) hypertension: Secondary | ICD-10-CM | POA: Insufficient documentation

## 2018-05-22 DIAGNOSIS — K0889 Other specified disorders of teeth and supporting structures: Secondary | ICD-10-CM | POA: Diagnosis not present

## 2018-05-22 DIAGNOSIS — R220 Localized swelling, mass and lump, head: Secondary | ICD-10-CM | POA: Diagnosis not present

## 2018-05-22 DIAGNOSIS — I509 Heart failure, unspecified: Secondary | ICD-10-CM | POA: Insufficient documentation

## 2018-05-22 DIAGNOSIS — K029 Dental caries, unspecified: Secondary | ICD-10-CM | POA: Diagnosis not present

## 2018-05-22 DIAGNOSIS — F17210 Nicotine dependence, cigarettes, uncomplicated: Secondary | ICD-10-CM | POA: Diagnosis not present

## 2018-05-22 DIAGNOSIS — Z95810 Presence of automatic (implantable) cardiac defibrillator: Secondary | ICD-10-CM | POA: Insufficient documentation

## 2018-05-22 MED ORDER — LIDOCAINE VISCOUS HCL 2 % MT SOLN: 15.0000 mL | OROMUCOSAL | 0 refills | Status: DC | PRN

## 2018-05-22 MED ORDER — CLINDAMYCIN HCL 150 MG PO CAPS: 300.0000 mg | ORAL_CAPSULE | Freq: Three times a day (TID) | ORAL | 0 refills | Status: DC

## 2018-05-22 NOTE — Discharge Instructions (Signed)
Take the prescribed medication as directed.  Can apply lidocaine onto the gums or swish around the mouth. Follow-up with dentist-- see guide on back to help find local clinic if needed. Return to the ED for new or worsening symptoms.

## 2018-05-22 NOTE — Telephone Encounter (Signed)
Call to [facility] to check on results of PA for Cialis.  Denial as Medicare Part D does not cover any medications for patient's diagnosis of Erectile Dysfunction.  Angelina Ok, RN 05/22/2018 9:05 AM.

## 2018-05-22 NOTE — ED Triage Notes (Addendum)
Pt reports left sided gum pain that has caused his whole left side of his face to hurt.    Pt - ride asked we put his number in the chart for use To be picked up.  Erven Colla [phone].

## 2018-05-22 NOTE — ED Provider Notes (Signed)
[facility] EMERGENCY DEPARTMENT Provider Note   CSN: [id number] Arrival date & time: 05/22/18  2043     History   Chief Complaint Chief Complaint  Patient presents with  . Dental Pain    HPI [name] Phillips is a 62 y.o. male.  The history is provided by the patient and medical records.  Dental Pain       62 y.o. M with hx of alcohol abuse, CHF, CVA, seizure disorder, cardiomyopathy, presenting to the ED for left lower dental/gum pain.  States this has been ongoing for about 3 days now.  He reports he has a tooth that he needs pulled but unsure when he can get this done.  States his whole left side of his face is starting to hurt now and gums feels swollen.  No difficulty swallowing.  He tried using some orajel and soaking his gums in nyquil which helped temporarily.  He denies fever or trouble swallowing.    Past Medical History:  Diagnosis Date  . AICD (automatic cardioverter/defibrillator) present 08/08/2011  . Alcohol abuse   . Arthritis   . CHF (congestive heart failure) (HCC)   . CVA (cerebral vascular accident) (HCC)   . Hypertension   . Nonischemic cardiomyopathy (HCC)    a. Catheterization 2005 normal coronary arteries; EF 15-20%; b. 2011 EF normal;  c. 2015 Echo: EF 25-30%;  d. s/p prior AICD with upgrade to MDT single lead ICD ser # [id number] h 2/2 ERI &12/19/2014).  . Seizure [facility])     Patient Active Problem List   Diagnosis Date Noted  . Fall at home, initial encounter 11/25/2017  . Acute kidney injury (HCC) 08/20/2017  . Acute gastric ulcer   . Upper GI bleed 08/04/2017  . Acute blood loss anemia 08/04/2017  . Thrombocytopenia (HCC) 08/04/2017  . Chronic systolic heart failure (HCC)   . Unintentional weight loss 06/06/2017  . Preventative health care 08/09/2015  . ED (erectile dysfunction) of non-organic origin 05/01/2015  .   6949 Lead 11/12/2012  . Nonischemic cardiomyopathy (HCC)   . Seizure disorder (HCC) 08/08/2011  . Alcohol  abuse 08/08/2011  . Tobacco abuse 08/08/2011  . HTN (hypertension) 08/08/2011  . Systolic CHF (HCC) 08/08/2011  . History of CVA (cerebrovascular accident) 08/08/2011  . AICD (automatic cardioverter/defibrillator) present 08/08/2011    Past Surgical History:  Procedure Laterality Date  . CARDIAC DEFIBRILLATOR PLACEMENT  2007   MDT [employer] single chamber ICD implanted with [phone] lead by Dr Ty Hilts for primary prevention  . EP IMPLANTABLE DEVICE N/A 12/19/2014   Procedure:  ICD Generator Changeout;  Surgeon: Duke Salvia, MD;  Location: [facility] INVASIVE CV LAB;  Service: Cardiovascular;  Laterality: N/A;  . EP IMPLANTABLE DEVICE N/A 12/19/2014   Procedure: Lead Revision;  Surgeon: Duke Salvia, MD;  Location: [facility] INVASIVE CV LAB;  Service: Cardiovascular;  Laterality: N/A;  . ESOPHAGOGASTRODUODENOSCOPY (EGD) WITH PROPOFOL N/A 08/06/2017   Procedure: ESOPHAGOGASTRODUODENOSCOPY (EGD) WITH PROPOFOL;  Surgeon: Kathi Der, MD;  Location: [facility] ENDOSCOPY;  Service: Gastroenterology;  Laterality: N/A;  . ICD GENERATOR CHANGE  12/19/2014        Home Medications    Prior to Admission medications   Medication Sig Start Date End Date Taking? Authorizing Provider  acetaminophen (TYLENOL) 325 MG tablet Take 2 tablets (650 mg total) by mouth every 6 (six) hours as needed. Do not take more than 4000mg  of tylenol per day Patient not taking: Reported on 11/25/2017 07/27/17   Couture, [employer], PA-C  atorvastatin (LIPITOR) 20 MG tablet Take 1 tablet (20 mg total) by mouth daily. 01/12/18   Yvette RackAgyei, Obed K, MD  carvedilol (COREG) 25 MG tablet TAKE 1 TABLET TWICE DAILY WITH MEALS 01/12/18   Yvette RackAgyei, Obed K, MD  clopidogrel (PLAVIX) 75 MG tablet Take 1 tablet (75 mg total) by mouth daily. At breakfast 01/12/18   Yvette RackAgyei, Obed K, MD  folic acid (FOLVITE) 1 MG tablet Take 1 tablet (1 mg total) by mouth daily. Patient not taking: Reported on 08/04/2017 04/25/17   Fuller Planice, Christopher W, MD  Multiple Vitamin (MULTIVITAMIN WITH  MINERALS) TABS tablet Take 1 tablet by mouth daily.    [provider]  naltrexone (DEPADE) 50 MG tablet TAKE 2 TABLETS EVERY DAY 09/23/17   Rice, Jamesetta Orleanshristopher W, MD  pantoprazole (PROTONIX) 40 MG tablet TAKE 1 TABLET TWICE DAILY 04/15/18   Agyei, Hermina Staggersbed K, MD  ramipril (ALTACE) 10 MG capsule TAKE 1 CAPSULE TWICE DAILY 01/12/18   Yvette RackAgyei, Obed K, MD  spironolactone (ALDACTONE) 25 MG tablet Take 1 tablet (25 mg total) by mouth daily. 01/12/18   Yvette RackAgyei, Obed K, MD  tadalafil (CIALIS) 10 MG tablet Take 1 tablet (10 mg total) by mouth as needed for erectile dysfunction. 04/17/18 04/17/19  Claudean SeveranceKrienke, Marissa M, MD  thiamine (VITAMIN B-1) 100 MG tablet Take 1 tablet (100 mg total) by mouth daily. Patient not taking: Reported on 08/04/2017 06/06/17   Fuller Planice, Christopher W, MD    Family History Family History  Problem Relation Age of Onset  . Heart disease Father   . Thyroid cancer Mother   . Hypertension Other   . Diabetes type II Sister   . Seizures Brother   . Lupus Sister     Social History Social History   Tobacco Use  . Smoking status: Current Every Day Smoker    Packs/day: 0.25    Years: 39.00    Pack years: 9.75    Types: Cigarettes  . Smokeless tobacco: Former NeurosurgeonUser    Types: Snuff, Chew  . Tobacco comment: Smoking .25  Smokes cigers   Substance Use Topics  . Alcohol use: Yes    Alcohol/week: 3.0 standard drinks    Types: 3 Cans of beer per week    Comment: 2x/month  . Drug use: No    Comment: Former crack cocaine use for several years, last 9 years ago     Allergies   Codeine; Penicillins; and Prednisone   Review of Systems Review of Systems  HENT: Positive for dental problem.   All other systems reviewed and are negative.    Physical Exam Updated Vital Signs BP 109/87 (BP Location: Right Arm)   Pulse (!) 55   Temp 98 F (36.7 C) (Oral)   Resp 16   SpO2 100%   Physical Exam Vitals signs and nursing note reviewed.  Constitutional:      Appearance: He is  well-developed.  HENT:     Head: Normocephalic and atraumatic.     Mouth/Throat:     Comments: Majority of teeth are missing, left lower first premolar is lone tooth of left lower area and it appears decayed, surrounding gingiva swollen and irritated without discrete abscess, handling secretions appropriately, no trismus, no facial or neck swelling, phonation is altered at baseline due to lack of dentition, no stridor Eyes:     Conjunctiva/sclera: Conjunctivae normal.     Pupils: Pupils are equal, round, and reactive to light.  Neck:     Musculoskeletal: Normal range of motion.  Cardiovascular:     Rate and Rhythm: Normal rate and regular rhythm.     Heart sounds: Normal heart sounds.  Pulmonary:     Effort: Pulmonary effort is normal.     Breath sounds: Normal breath sounds. No stridor. No rhonchi.  Abdominal:     General: Bowel sounds are normal.     Palpations: Abdomen is soft.  Musculoskeletal: Normal range of motion.  Skin:    General: Skin is warm and dry.  Neurological:     Mental Status: He is alert and oriented to person, place, and time.      ED Treatments / Results  Labs (all labs ordered are listed, but only abnormal results are displayed) Labs Reviewed - No data to display  EKG None  Radiology No results found.  Procedures Procedures (including critical care time)  Medications Ordered in ED Medications - No data to display   Initial Impression / Assessment and Plan / ED Course  I have reviewed the triage vital signs and the nursing notes.  Pertinent labs & imaging results that were available during my care of the patient were reviewed by me and considered in my medical decision making (see chart for details).  62 year old male here with left lower dental pain.  Reports he has a tooth that needs to be pulled.  He is afebrile and nontoxic.  He is left lower first premolar is lone tooth along the left side of his mouth, which is decayed and has some  gingival swelling without discrete abscess.  He is handling secretions well.  No facial or neck swelling. Phonation is altered at baseline due to lack of dentition but there is no stridor.  No signs or symptoms suggestive of Ludwig's angina.  He will be started on antibiotics and referred to dentist for follow-up, given dental resource guide.  He will return here for any new or worsening symptoms.  Final Clinical Impressions(s) / ED Diagnoses   Final diagnoses:  Pain, dental  Dental infection    ED Discharge Orders         Ordered     05/22/18 2356    lidocaine (XYLOCAINE) 2 % solution  As needed     05/22/18 2356    clindamycin (CLEOCIN) 150 MG capsule  3 times daily     05/22/18 2357           Garlon Hatchet, PA-C 05/22/18 2359    Pricilla Loveless, MD 05/23/18 [phone]

## 2018-05-23 NOTE — ED Notes (Signed)
Assumed care of pt for discharge.  See PA assessment.

## 2018-06-16 NOTE — Addendum Note (Signed)
Addended by: Neomia Dear on: 06/16/2018 09:12 AM   Modules accepted: Orders

## 2018-07-23 ENCOUNTER — Other Ambulatory Visit: Payer: Self-pay | Admitting: *Deleted

## 2018-08-14 ENCOUNTER — Other Ambulatory Visit: Payer: Self-pay | Admitting: *Deleted

## 2018-08-14 ENCOUNTER — Other Ambulatory Visit: Payer: Self-pay | Admitting: Internal Medicine

## 2018-08-14 DIAGNOSIS — I428 Other cardiomyopathies: Secondary | ICD-10-CM

## 2018-08-14 DIAGNOSIS — I10 Essential (primary) hypertension: Secondary | ICD-10-CM

## 2018-08-14 MED ORDER — CLOPIDOGREL BISULFATE 75 MG PO TABS: 75.0000 mg | ORAL_TABLET | Freq: Every day | ORAL | 1 refills | Status: AC

## 2018-08-28 ENCOUNTER — Other Ambulatory Visit: Payer: Self-pay

## 2018-08-28 DIAGNOSIS — I428 Other cardiomyopathies: Secondary | ICD-10-CM

## 2018-08-28 DIAGNOSIS — I10 Essential (primary) hypertension: Secondary | ICD-10-CM

## 2018-08-28 NOTE — Telephone Encounter (Signed)
Almira Coaster with [facility] requesting atorvastatin (LIPITOR) 20 MG tablet, and  spironolactone (ALDACTONE) 25 MG tablet

## 2018-08-29 MED ORDER — ATORVASTATIN CALCIUM 20 MG PO TABS: 20.0000 mg | ORAL_TABLET | Freq: Every day | ORAL | 1 refills | Status: DC

## 2018-08-29 MED ORDER — SPIRONOLACTONE 25 MG PO TABS: 25.0000 mg | ORAL_TABLET | Freq: Every day | ORAL | 1 refills | Status: DC

## 2018-09-01 ENCOUNTER — Other Ambulatory Visit: Payer: Self-pay | Admitting: Internal Medicine

## 2018-09-01 NOTE — Telephone Encounter (Signed)
Please contact pharmacy

## 2018-09-01 NOTE — Telephone Encounter (Signed)
Spoke w/ pharmacist, he states they have rec'd scripts and are all good

## 2018-09-22 ENCOUNTER — Other Ambulatory Visit: Payer: Self-pay | Admitting: Internal Medicine

## 2018-09-22 DIAGNOSIS — F1010 Alcohol abuse, uncomplicated: Secondary | ICD-10-CM

## 2018-10-12 ENCOUNTER — Telehealth: Payer: Self-pay

## 2018-10-12 NOTE — Telephone Encounter (Signed)
Requesting to speak with you. Please call back.  

## 2018-10-12 NOTE — Telephone Encounter (Signed)
Patient was calling about an appointment,Patient will have Tele-health by his PCP on 10-21-2018.

## 2018-10-21 ENCOUNTER — Encounter: Payer: Medicare HMO | Admitting: Internal Medicine

## 2018-11-12 ENCOUNTER — Encounter: Payer: Self-pay | Admitting: Internal Medicine

## 2018-12-22 ENCOUNTER — Other Ambulatory Visit: Payer: Self-pay | Admitting: *Deleted

## 2018-12-22 DIAGNOSIS — F1010 Alcohol abuse, uncomplicated: Secondary | ICD-10-CM

## 2018-12-23 MED ORDER — NALTREXONE HCL 50 MG PO TABS
100.0000 mg | ORAL_TABLET | Freq: Every day | ORAL | 0 refills | Status: DC
Start: 2018-12-23 — End: 2019-03-19

## 2019-01-13 ENCOUNTER — Ambulatory Visit (INDEPENDENT_AMBULATORY_CARE_PROVIDER_SITE_OTHER): Payer: Medicare Other | Admitting: Internal Medicine

## 2019-01-13 ENCOUNTER — Encounter: Payer: Self-pay | Admitting: Internal Medicine

## 2019-01-13 ENCOUNTER — Other Ambulatory Visit: Payer: Self-pay

## 2019-01-13 VITALS — BP 120/81 | HR 68 | Temp 98.3°F | Ht 74.0 in | Wt 74.8 lb

## 2019-01-13 DIAGNOSIS — Z23 Encounter for immunization: Secondary | ICD-10-CM | POA: Diagnosis not present

## 2019-01-13 DIAGNOSIS — D649 Anemia, unspecified: Secondary | ICD-10-CM | POA: Diagnosis not present

## 2019-01-13 DIAGNOSIS — I428 Other cardiomyopathies: Secondary | ICD-10-CM | POA: Diagnosis not present

## 2019-01-13 DIAGNOSIS — I5020 Unspecified systolic (congestive) heart failure: Secondary | ICD-10-CM | POA: Diagnosis not present

## 2019-01-13 DIAGNOSIS — Z79899 Other long term (current) drug therapy: Secondary | ICD-10-CM

## 2019-01-13 DIAGNOSIS — I110 Hypertensive heart disease with heart failure: Secondary | ICD-10-CM

## 2019-01-13 DIAGNOSIS — I10 Essential (primary) hypertension: Secondary | ICD-10-CM

## 2019-01-13 DIAGNOSIS — Z8719 Personal history of other diseases of the digestive system: Secondary | ICD-10-CM

## 2019-01-13 DIAGNOSIS — D696 Thrombocytopenia, unspecified: Secondary | ICD-10-CM

## 2019-01-13 DIAGNOSIS — K922 Gastrointestinal hemorrhage, unspecified: Secondary | ICD-10-CM | POA: Diagnosis not present

## 2019-01-13 DIAGNOSIS — I5022 Chronic systolic (congestive) heart failure: Secondary | ICD-10-CM

## 2019-01-13 DIAGNOSIS — Z0000 Encounter for general adult medical examination without abnormal findings: Secondary | ICD-10-CM

## 2019-01-13 NOTE — Patient Instructions (Signed)
Mr. Benney,  It was a pleasure taking care of your the clinic today.  Overall, I think you are doing very well and I also congratulate you for staying away from alcohol.  I would however advise you to cut back on the smoking as this will decrease your chance of getting a stroke or heart attack.  I am sending you to have a colonoscopy and also an ultrasound of your heart to evaluate the function.  I am also getting blood work on you today.  Take care! Dr. Eileen Stanford  Please call the internal medicine center clinic if you have any questions or concerns, we may be able to help and keep you from a long and expensive emergency room wait. Our clinic and after hours phone number is [phone], the best time to call is Monday through Friday 9 am to 4 pm but there is always someone available 24/7 if you have an emergency. If you need medication refills please notify your pharmacy one week in advance and they will send [place] a request.

## 2019-01-13 NOTE — Assessment & Plan Note (Signed)
Thrombocytopenia: Platelet count has dropped to as low as 82.  Likely secondary to previous EtOH use.  He denies any history of cirrhosis of the liver and on physical examination, his skin does not appear jaundiced and no evidence of spider angiomata.  Plan: -Follow-up CBC - Advised on continued abstinence from alcohol.

## 2019-01-13 NOTE — Assessment & Plan Note (Signed)
Anemia secondary to upper GI bleed: In March 2019 he was admitted to the hospital and was found to have an upper GI bleed secondary to a nonbleeding gastric ulcer that was discovered on EGD.  Last H/H about a year ago was 10.5/32.4, MCV 93.6.  Denies any subsequent episodes of hematemesis or hemoptysis.  Plan: -Follow-up CBC

## 2019-01-13 NOTE — Progress Notes (Signed)
   CC: Follow-up hypertension  HPI:  [name] is a 63 y.o. with medical history listed below presenting to follow-up on chronic medical problems.  Please see problem based charting for further details.  Past Medical History:  Diagnosis Date  .   1017 Lead 11/12/2012  . Acute kidney injury ([place]) 08/20/2017  . AICD (automatic cardioverter/defibrillator) present 08/08/2011  . Alcohol abuse   . Arthritis   . CHF (congestive heart failure) ([place])   . CVA (cerebral vascular accident) ([place])   . Fall at home, initial encounter 11/25/2017  . Hypertension   . Nonischemic cardiomyopathy ([place])    a. Catheterization 2005 normal coronary arteries; EF 15-20%; b. 2011 EF normal;  c. 2015 Echo: EF 25-30%;  d. s/p prior AICD with upgrade to MDT single lead ICD ser # [id number] h 2/2 ERI &12/19/2014).  . Seizure ([place])   . Seizure disorder ([place]) 08/08/2011   Review of Systems:  As per HPI  Physical Exam:  Vitals:   01/13/19 1345  BP: 120/81  Pulse: 68  Temp: 98.3 F (36.8 C)  TempSrc: Oral  SpO2: 99%  Weight: 74 lb 12.8 oz (33.9 kg)  Height: 6\' 2"  (1.88 m)   Physical Exam  Constitutional: He is well-developed, well-nourished, and in no distress.  HENT:  Head: Normocephalic and atraumatic.  Eyes: Conjunctivae are normal.  Cardiovascular: Normal rate, regular rhythm and normal heart sounds.  No murmur heard. Pulmonary/Chest: Effort normal and breath sounds normal. He has no wheezes. He has no rales.  Abdominal: Soft. Bowel sounds are normal. There is no abdominal tenderness.  Musculoskeletal: Normal range of motion.        General: No deformity or edema.  Neurological: He is alert.  Psychiatric: Mood and affect normal.  Vitals reviewed.   Assessment & Plan:   See Encounters Tab for problem based charting.  Patient discussed with Dr. Evette Doffing

## 2019-01-13 NOTE — Assessment & Plan Note (Signed)
Hypertension: Well-controlled on multiple medications  BP Readings from Last 3 Encounters:  01/13/19 120/81  05/23/18 103/78  04/17/18 106/78   Plan:  -Continue Coreg 25 mg twice daily -Continue ramipril 10 mg twice daily -Continue Aldactone 25 mg daily

## 2019-01-13 NOTE — Assessment & Plan Note (Signed)
Hx of EtOH induced cardiomyopathy: He was found to have nonischemic cardiomyopathy, heart failure with reduced ejection fraction with EF 25 to 30% on echocardiogram in 2015 (previosly 15-20% in 2005) and is s/p AICD.  He tells me that he follows up with Dr. Margot Ables.  He is doing well otherwise and denies dyspnea on exertion, chest pain, dizziness, lightheadedness, lower extremity edema.  He has abstained from alcohol use and attribute most of the success to naltrexone use.  Plan: -Continue Coreg 25 mg twice daily -Continue ramipril 10 mg twice daily -Continue Aldactone 25 mg daily - Follow-up on repeat echocardiogram - Advised on importance of alcohol cessation

## 2019-01-14 LAB — CMP14 + ANION GAP
ALT: 12 IU/L (ref 0–44)
AST: 19 IU/L (ref 0–40)
Albumin/Globulin Ratio: 1.5 (ref 1.2–2.2)
Albumin: 4.3 g/dL (ref 3.8–4.8)
Alkaline Phosphatase: 112 IU/L (ref 39–117)
Anion Gap: 16 mmol/L (ref 10.0–18.0)
BUN/Creatinine Ratio: 17 (ref 10–24)
BUN: 30 mg/dL — ABNORMAL HIGH (ref 8–27)
Bilirubin Total: 0.4 mg/dL (ref 0.0–1.2)
CO2: 23 mmol/L (ref 20–29)
Calcium: 9.8 mg/dL (ref 8.6–10.2)
Chloride: 102 mmol/L (ref 96–106)
Creatinine, Ser: 1.73 mg/dL — ABNORMAL HIGH (ref 0.76–1.27)
GFR calc Af Amer: 47 mL/min/{1.73_m2} — ABNORMAL LOW (ref >59)
GFR calc non Af Amer: 41 mL/min/{1.73_m2} — ABNORMAL LOW (ref >59)
Globulin, Total: 2.8 g/dL (ref 1.5–4.5)
Glucose: 92 mg/dL (ref 65–99)
Potassium: 5.2 mmol/L (ref 3.5–5.2)
Sodium: 141 mmol/L (ref 134–144)
Total Protein: 7.1 g/dL (ref 6.0–8.5)

## 2019-01-14 LAB — CBC
Hematocrit: 34.7 % — ABNORMAL LOW (ref 37.5–51.0)
Hemoglobin: 11.2 g/dL — ABNORMAL LOW (ref 13.0–17.7)
MCH: 29.7 pg (ref 26.6–33.0)
MCHC: 32.3 g/dL (ref 31.5–35.7)
MCV: 92 fL (ref 79–97)
Platelets: 166 10*3/uL (ref 150–450)
RBC: 3.77 x10E6/uL — ABNORMAL LOW (ref 4.14–5.80)
RDW: 14.7 % (ref 11.6–15.4)
WBC: 5.9 10*3/uL (ref 3.4–10.8)

## 2019-01-14 NOTE — Progress Notes (Signed)
Internal Medicine Clinic Attending  Case discussed with Dr. Agyei at the time of the visit.  We reviewed the resident's history and exam and pertinent patient test results.  I agree with the assessment, diagnosis, and plan of care documented in the resident's note.    

## 2019-01-18 ENCOUNTER — Encounter: Payer: Self-pay | Admitting: Internal Medicine

## 2019-01-18 ENCOUNTER — Other Ambulatory Visit (HOSPITAL_COMMUNITY): Payer: Medicare Other

## 2019-01-18 ENCOUNTER — Telehealth: Payer: Self-pay | Admitting: Internal Medicine

## 2019-01-18 ENCOUNTER — Other Ambulatory Visit: Payer: Self-pay | Admitting: Internal Medicine

## 2019-01-18 NOTE — Telephone Encounter (Signed)
Please contact [place] at [phone] regarding your colonoscopy.

## 2019-01-18 NOTE — Telephone Encounter (Signed)
I have tried to reach out to [name] Phillips several times since Friday, January 15, 2019 but have been unsuccessful reaching him.  I tried to discuss his laboratory results with him.  [name] Phillips CMP shows prerenal azotemia probably from decreased p.o. intake. I tried calling him several times on his home and work phone however no answer. My plan is to have him hold his Aldactone and lisinopril for about 3 days while increasing his oral fluid intake.   I reached out to him again today with my recommendations but could not reach him and left a voicemail.

## 2019-01-18 NOTE — Progress Notes (Signed)
Sent letter

## 2019-01-22 ENCOUNTER — Other Ambulatory Visit: Payer: Self-pay

## 2019-01-22 ENCOUNTER — Ambulatory Visit (HOSPITAL_COMMUNITY)
Admission: RE | Admit: 2019-01-22 | Discharge: 2019-01-22 | Disposition: A | Discharge status: Home / Self Care | Payer: Medicare Other | Source: Ambulatory Visit | Attending: Internal Medicine | Admitting: Internal Medicine

## 2019-01-22 DIAGNOSIS — R0600 Dyspnea, unspecified: Secondary | ICD-10-CM | POA: Insufficient documentation

## 2019-01-22 DIAGNOSIS — I082 Rheumatic disorders of both aortic and tricuspid valves: Secondary | ICD-10-CM | POA: Insufficient documentation

## 2019-01-22 DIAGNOSIS — I428 Other cardiomyopathies: Secondary | ICD-10-CM | POA: Insufficient documentation

## 2019-01-22 NOTE — Progress Notes (Signed)
  Echocardiogram 2D Echocardiogram has been performed.  [name] Phillips 01/22/2019, 3:00 PM

## 2019-01-25 ENCOUNTER — Telehealth: Payer: Self-pay | Admitting: Internal Medicine

## 2019-01-25 NOTE — Telephone Encounter (Signed)
I was able to reach out to [name] to discuss his echocardiogram.  Was not able to talk to him but I did leave a voice note and also sure that he can call me back if he needs further clarification.

## 2019-01-26 ENCOUNTER — Telehealth: Payer: Self-pay | Admitting: *Deleted

## 2019-01-26 NOTE — Telephone Encounter (Signed)
CALL FROM [facility] CONCERNING PATIENT. OFFICE UNABLE TO SEE THE PATIENT. PATIENT WILL NEED TO CONTACT BUSINESS SERVICE AT [phone].

## 2019-02-18 NOTE — Telephone Encounter (Addendum)
Patient returning call to PCP. He wasn't sure which meds he was supposed to stop. He stopped 2 meds for 2-3 days, started getting a h/a, called Dr Zenia Resides office and was told to start taking meds again. States he has been drinking fluids, just not alcohol. He would appreciate a call from Dr. Eileen Stanford today at [phone]. Hubbard Hartshorn, RN, BSN

## 2019-02-19 ENCOUNTER — Other Ambulatory Visit: Payer: Self-pay | Admitting: Internal Medicine

## 2019-02-19 DIAGNOSIS — I10 Essential (primary) hypertension: Secondary | ICD-10-CM

## 2019-02-19 NOTE — Progress Notes (Signed)
Called Mr. Headen and was finally able to reach him. I asked him to report to the clinic next week for LAB only appointment.

## 2019-02-19 NOTE — Addendum Note (Signed)
Addended by: Hulan Fray on: 02/19/2019 11:42 AM   Modules accepted: Orders

## 2019-02-19 NOTE — Telephone Encounter (Signed)
Thanks Lauren,  I will reach out to him again. I will have him come into the clinic for repeat BMP.

## 2019-02-22 ENCOUNTER — Emergency Department (HOSPITAL_COMMUNITY)
Admission: EM | Admit: 2019-02-22 | Discharge: 2019-02-22 | Discharge status: Absent without leave | Payer: Medicare Other

## 2019-02-22 ENCOUNTER — Other Ambulatory Visit (INDEPENDENT_AMBULATORY_CARE_PROVIDER_SITE_OTHER): Payer: Medicare Other

## 2019-02-22 DIAGNOSIS — I10 Essential (primary) hypertension: Secondary | ICD-10-CM | POA: Diagnosis not present

## 2019-02-23 LAB — BMP8+ANION GAP
Anion Gap: 16 mmol/L (ref 10.0–18.0)
BUN/Creatinine Ratio: 17 (ref 10–24)
BUN: 32 mg/dL — ABNORMAL HIGH (ref 8–27)
CO2: 22 mmol/L (ref 20–29)
Calcium: 10 mg/dL (ref 8.6–10.2)
Chloride: 108 mmol/L — ABNORMAL HIGH (ref 96–106)
Creatinine, Ser: 1.89 mg/dL — ABNORMAL HIGH (ref 0.76–1.27)
GFR calc Af Amer: 42 mL/min/{1.73_m2} — ABNORMAL LOW (ref >59)
GFR calc non Af Amer: 36 mL/min/{1.73_m2} — ABNORMAL LOW (ref >59)
Glucose: 102 mg/dL — ABNORMAL HIGH (ref 65–99)
Potassium: 4.9 mmol/L (ref 3.5–5.2)
Sodium: 146 mmol/L — ABNORMAL HIGH (ref 134–144)

## 2019-02-24 ENCOUNTER — Other Ambulatory Visit: Payer: Self-pay | Admitting: Internal Medicine

## 2019-02-24 ENCOUNTER — Telehealth: Payer: Self-pay | Admitting: Internal Medicine

## 2019-02-24 NOTE — Telephone Encounter (Signed)
error 

## 2019-02-24 NOTE — Telephone Encounter (Signed)
I called [name] to discuss his BMP with him.  It does show that his renal function is not completely back to his baseline though the last time BMP was checked was July 2019 and showed a serum creatinine of 1.2.  During my last encounter with him in August 2020 he had a serum creatinine of 1.7 with a BUN of 30.  I did ask him to HOLD his ramipril and spironolactone.  He however resumed the medication without my consent.  I had him repeat his BMP several days ago and still not back to his baseline.  He is still taking his ramipril and spironolactone and I have asked him to HOLD them for now and make an appointment to see me at my continuity visit this afternoon or an [facility] appointment tomorrow or sometime this week.

## 2019-02-25 ENCOUNTER — Ambulatory Visit: Payer: Medicare Other

## 2019-03-03 ENCOUNTER — Ambulatory Visit: Payer: Medicare Other | Admitting: Internal Medicine

## 2019-03-18 ENCOUNTER — Other Ambulatory Visit: Payer: Self-pay | Admitting: Internal Medicine

## 2019-03-18 DIAGNOSIS — F1010 Alcohol abuse, uncomplicated: Secondary | ICD-10-CM

## 2019-03-19 ENCOUNTER — Other Ambulatory Visit: Payer: Self-pay | Admitting: Internal Medicine

## 2019-06-10 ENCOUNTER — Other Ambulatory Visit: Payer: Self-pay | Admitting: Internal Medicine

## 2019-06-10 DIAGNOSIS — I428 Other cardiomyopathies: Secondary | ICD-10-CM

## 2019-06-10 DIAGNOSIS — I10 Essential (primary) hypertension: Secondary | ICD-10-CM

## 2019-06-10 MED ORDER — ATORVASTATIN CALCIUM 20 MG PO TABS: 20.0000 mg | ORAL_TABLET | Freq: Every day | ORAL | 1 refills | Status: DC

## 2019-06-10 MED ORDER — SPIRONOLACTONE 25 MG PO TABS: 25.0000 mg | ORAL_TABLET | Freq: Every day | ORAL | 1 refills | Status: AC

## 2019-06-10 NOTE — Telephone Encounter (Signed)
Refill Request   atorvastatin (LIPITOR) 20 MG tablet  spironolactone (ALDACTONE) 25 MG tablet  Pt is stating he needs his medicine to [facility].

## 2019-06-26 ENCOUNTER — Ambulatory Visit
Admission: EM | Admit: 2019-06-26 | Discharge: 2019-06-26 | Disposition: A | Discharge status: Home / Self Care | Payer: Medicare Other | Attending: Emergency Medicine | Admitting: Emergency Medicine

## 2019-06-26 ENCOUNTER — Other Ambulatory Visit: Payer: Self-pay

## 2019-06-26 ENCOUNTER — Encounter: Payer: Self-pay | Admitting: Emergency Medicine

## 2019-06-26 DIAGNOSIS — Z20822 Contact with and (suspected) exposure to covid-19: Secondary | ICD-10-CM | POA: Diagnosis not present

## 2019-06-26 NOTE — Discharge Instructions (Addendum)
Your COVID test is pending - it is important to quarantine / isolate at home until your results are back. °If you test positive and would like further evaluation for persistent or worsening symptoms, you may schedule an E-visit or virtual (video) visit throughout the [facility] MyChart app or website. ° °PLEASE NOTE: If you develop severe chest pain or shortness of breath please go to the ER or call 9-1-1 for further evaluation --> DO NOT schedule electronic or virtual visits for this. °Please call our office for further guidance / recommendations as needed. ° °For information about the Covid vaccine, please visit [email or web address] °

## 2019-06-26 NOTE — ED Triage Notes (Signed)
Pt presents to [facility] for assessment after his wife had to be admitted to the hospital yesterday with COVID.  Would like to be tested, denies any symptoms at this time.

## 2019-06-26 NOTE — ED Provider Notes (Signed)
[facility]    CSN: [id number] Arrival date & time: 06/26/19  1232      History   Chief Complaint Chief Complaint  Patient presents with  . Exposure to COVID    HPI [name] Phillips is a 64 y.o. male with history of alcohol abuse, tobacco use, hypertension, CHF  Presenting for Covid testing: Exposure: Wife who was admitted to hospital yesterday with Covid infection Date of exposure: Cohabitate Any fever, symptoms since exposure: None No additional questions regarding testing at this time  Past Medical History:  Diagnosis Date  .   0109 Lead 11/12/2012  . Acute kidney injury (HCC) 08/20/2017  . AICD (automatic cardioverter/defibrillator) present 08/08/2011  . Alcohol abuse   . Arthritis   . CHF (congestive heart failure) (HCC)   . CVA (cerebral vascular accident) (HCC)   . Fall at home, initial encounter 11/25/2017  . Hypertension   . Nonischemic cardiomyopathy (HCC)    a. Catheterization 2005 normal coronary arteries; EF 15-20%; b. 2011 EF normal;  c. 2015 Echo: EF 25-30%;  d. s/p prior AICD with upgrade to MDT single lead ICD ser # [id number] h 2/2 ERI &12/19/2014).  . Seizure (HCC)   . Seizure disorder (HCC) 08/08/2011    Patient Active Problem List   Diagnosis Date Noted  . Hx of Upper GI bleed 08/04/2017  . Thrombocytopenia (HCC) 08/04/2017  . Unintentional weight loss 06/06/2017  . Preventative health care 08/09/2015  . ED (erectile dysfunction) of non-organic origin 05/01/2015  . Nonischemic cardiomyopathy (HCC)   . Alcohol abuse 08/08/2011  . Tobacco abuse 08/08/2011  . HTN (hypertension) 08/08/2011  . Systolic CHF (HCC) 08/08/2011  . History of CVA (cerebrovascular accident) 08/08/2011  . AICD (automatic cardioverter/defibrillator) present 08/08/2011    Past Surgical History:  Procedure Laterality Date  . CARDIAC DEFIBRILLATOR PLACEMENT  2007   MDT [employer] single chamber ICD implanted with [phone] lead by Dr Ty Hilts for primary prevention  . EP  IMPLANTABLE DEVICE N/A 12/19/2014   Procedure:  ICD Generator Changeout;  Surgeon: Duke Salvia, MD;  Location: [facility] INVASIVE CV LAB;  Service: Cardiovascular;  Laterality: N/A;  . EP IMPLANTABLE DEVICE N/A 12/19/2014   Procedure: Lead Revision;  Surgeon: Duke Salvia, MD;  Location: [facility] INVASIVE CV LAB;  Service: Cardiovascular;  Laterality: N/A;  . ESOPHAGOGASTRODUODENOSCOPY (EGD) WITH PROPOFOL N/A 08/06/2017   Procedure: ESOPHAGOGASTRODUODENOSCOPY (EGD) WITH PROPOFOL;  Surgeon: Kathi Der, MD;  Location: [facility] ENDOS[facility];  Service: Gastroenterology;  Laterality: N/A;  . ICD GENERATOR CHANGE  12/19/2014       Home Medications    Prior to Admission medications   Medication Sig Start Date End Date Taking? Authorizing Provider  acetaminophen (TYLENOL) 325 MG tablet Take 2 tablets (650 mg total) by mouth every 6 (six) hours as needed. Do not take more than 4000mg  of tylenol per day Patient not taking: Reported on 11/25/2017 07/27/17   Couture, Cortni S, PA-C  atorvastatin (LIPITOR) 20 MG tablet Take 1 tablet (20 mg total) by mouth daily. 06/10/19   06/12/19, MD  carvedilol (COREG) 25 MG tablet TAKE 1 TABLET TWICE DAILY WITH MEALS 01/12/18   01/14/18, MD  clopidogrel (PLAVIX) 75 MG tablet Take 1 tablet (75 mg total) by mouth daily. At breakfast 08/14/18   08/16/18, MD  folic acid (FOLVITE) 1 MG tablet Take 1 tablet (1 mg total) by mouth daily. Patient not taking: Reported on 08/04/2017 04/25/17   04/27/17, MD  Multiple Vitamin (  MULTIVITAMIN WITH MINERALS) TABS tablet Take 1 tablet by mouth daily.    [provider]  naltrexone (DEPADE) 50 MG tablet TAKE 2 TABLETS (100 MG TOTAL) BY MOUTH DAILY. 03/19/19   Jean Rosenthal, MD  pantoprazole (PROTONIX) 40 MG tablet TAKE 1 TABLET TWICE DAILY 04/15/18   Agyei, Caprice Kluver, MD  ramipril (ALTACE) 10 MG capsule TAKE 1 CAPSULE TWICE DAILY 01/12/18   Jean Rosenthal, MD  spironolactone (ALDACTONE) 25 MG tablet Take 1 tablet (25 mg  total) by mouth daily. 06/10/19   Jean Rosenthal, MD  tadalafil (CIALIS) 10 MG tablet Take 1 tablet (10 mg total) by mouth as needed for erectile dysfunction. 04/17/18 04/17/19  Asencion Noble, MD  thiamine (VITAMIN B-1) 100 MG tablet Take 1 tablet (100 mg total) by mouth daily. Patient not taking: Reported on 08/04/2017 06/06/17   Collier Salina, MD    Family History Family History  Problem Relation Age of Onset  . Heart disease Father   . Thyroid cancer Mother   . Hypertension Other   . Diabetes type II Sister   . Seizures Brother   . Lupus Sister     Social History Social History   Tobacco Use  . Smoking status: Current Every Day Smoker    Packs/day: 0.25    Years: 39.00    Pack years: 9.75    Types: Cigarettes, Cigars  . Smokeless tobacco: Former Systems developer    Types: Snuff, Chew  . Tobacco comment: Smoking .25  Smokes cigers   Substance Use Topics  . Alcohol use: Yes    Alcohol/week: 3.0 standard drinks    Types: 3 Cans of beer per week    Comment: 2x/month  . Drug use: No    Comment: Former crack cocaine use for several years, last 9 years ago     Allergies   Codeine, Penicillins, and Prednisone   Review of Systems Review of Systems  Constitutional: Negative for fatigue and fever.  Respiratory: Negative for cough and shortness of breath.   Cardiovascular: Negative for chest pain and palpitations.  Gastrointestinal: Negative for abdominal pain, diarrhea and vomiting.  Musculoskeletal: Negative for arthralgias and myalgias.  Skin: Negative for rash and wound.  Neurological: Negative for speech difficulty and headaches.  All other systems reviewed and are negative.    Physical Exam Triage Vital Signs ED Triage Vitals  Enc Vitals Group     BP 06/26/19 1252 (!) 162/97     Pulse Rate 06/26/19 1252 72     Resp 06/26/19 1252 14     Temp 06/26/19 1252 98.7 F (37.1 C)     Temp Source 06/26/19 1252 Temporal     SpO2 06/26/19 1252 97 %     Weight --       Height --      Head Circumference --      Peak Flow --      Pain Score 06/26/19 1253 0     Pain Loc --      Pain Edu? --      Excl. in [place]? --    No data found.  Updated Vital Signs BP (!) 162/97 (BP Location: Left Arm)   Pulse 72   Temp 98.7 F (37.1 C) (Temporal)   Resp 14   SpO2 97%   Visual Acuity Right Eye Distance:   Left Eye Distance:   Bilateral Distance:    Right Eye Near:   Left Eye Near:    Bilateral Near:  Physical Exam Constitutional:      General: He is not in acute distress.    Appearance: He is not ill-appearing.  HENT:     Head: Normocephalic and atraumatic.     Mouth/Throat:     Mouth: Mucous membranes are moist.     Pharynx: Oropharynx is clear.  Eyes:     General: No scleral icterus.    Pupils: Pupils are equal, round, and reactive to light.  Neck:     Comments: Trachea midline, negative JVD Cardiovascular:     Rate and Rhythm: Normal rate and regular rhythm.     Heart sounds: No murmur. No gallop.   Pulmonary:     Effort: Pulmonary effort is normal. No respiratory distress.     Breath sounds: No wheezing or rales.  Musculoskeletal:     Cervical back: No tenderness.     Right lower leg: No edema.     Left lower leg: No edema.  Lymphadenopathy:     Cervical: No cervical adenopathy.  Skin:    Capillary Refill: Capillary refill takes less than 2 seconds.     Coloration: Skin is not jaundiced or pale.  Neurological:     General: No focal deficit present.     Mental Status: He is alert and oriented to person, place, and time.      UC Treatments / Results  Labs (all labs ordered are listed, but only abnormal results are displayed) Labs Reviewed  NOVEL CORONAVIRUS, NAA    EKG   Radiology No results found.  Procedures Procedures (including critical care time)  Medications Ordered in UC Medications - No data to display  Initial Impression / Assessment and Plan / UC Course  I have reviewed the triage vital signs and the  nursing notes.  Pertinent labs & imaging results that were available during my care of the patient were reviewed by me and considered in my medical decision making (see chart for details).     Patient afebrile, nontoxic, with SpO2 97%.  Covid PCR pending.  Patient to quarantine until results are back.  We will continue supportive management.  Given patient's age, comorbidities, would refer to infusion clinic if Covid positive.  Return precautions discussed, patient verbalized understanding and is agreeable to plan. Final Clinical Impressions(s) / UC Diagnoses   Final diagnoses:  Exposure to COVID-19 virus     Discharge Instructions     Your COVID test is pending - it is important to quarantine / isolate at home until your results are back. If you test positive and would like further evaluation for persistent or worsening symptoms, you may schedule an E-visit or virtual (video) visit throughout the [facility] app or website.  PLEASE NOTE: If you develop severe chest pain or shortness of breath please go to the ER or call 9-1-1 for further evaluation --> DO NOT schedule electronic or virtual visits for this. Please call our office for further guidance / recommendations as needed.  For information about the Covid vaccine, please visit [email or web address]    ED Prescriptions    None     PDMP not reviewed this encounter.   Hall-Potvin, [place], [place] 06/26/19 1303

## 2019-06-27 LAB — NOVEL CORONAVIRUS, NAA: SARS-CoV-2, NAA: NOT DETECTED

## 2019-07-02 ENCOUNTER — Telehealth: Payer: Self-pay | Admitting: Emergency Medicine

## 2019-07-02 NOTE — Telephone Encounter (Signed)
Pt called to receive COVID results.  Confirmed identity using two identifiers and provided negative result.  Patient verbalized understanding.

## 2019-07-08 ENCOUNTER — Encounter: Payer: Medicare Other | Admitting: Internal Medicine

## 2019-07-08 ENCOUNTER — Encounter: Payer: Self-pay | Admitting: Internal Medicine

## 2019-07-09 ENCOUNTER — Other Ambulatory Visit: Payer: Self-pay | Admitting: *Deleted

## 2019-07-09 DIAGNOSIS — F1010 Alcohol abuse, uncomplicated: Secondary | ICD-10-CM

## 2019-07-10 MED ORDER — NALTREXONE HCL 50 MG PO TABS: 100.0000 mg | ORAL_TABLET | Freq: Every day | ORAL | 10 refills | Status: DC

## 2019-11-02 ENCOUNTER — Telehealth: Payer: Self-pay | Admitting: *Deleted

## 2019-11-02 ENCOUNTER — Encounter: Payer: Self-pay | Admitting: Neurology

## 2019-11-02 ENCOUNTER — Ambulatory Visit: Payer: Medicare Other | Admitting: Neurology

## 2019-11-02 NOTE — Telephone Encounter (Signed)
No showed new patient appointment. 

## 2019-12-13 ENCOUNTER — Encounter: Payer: Self-pay | Admitting: Neurology

## 2019-12-20 ENCOUNTER — Ambulatory Visit: Payer: Medicare Other | Admitting: Neurology

## 2019-12-23 ENCOUNTER — Ambulatory Visit: Payer: Medicare Other | Admitting: Neurology

## 2020-03-16 ENCOUNTER — Other Ambulatory Visit: Payer: Self-pay | Admitting: Internal Medicine

## 2020-03-16 DIAGNOSIS — F1010 Alcohol abuse, uncomplicated: Secondary | ICD-10-CM

## 2020-04-13 ENCOUNTER — Telehealth: Payer: Self-pay | Admitting: *Deleted

## 2020-04-13 ENCOUNTER — Other Ambulatory Visit: Payer: Self-pay | Admitting: Internal Medicine

## 2020-04-13 DIAGNOSIS — F1010 Alcohol abuse, uncomplicated: Secondary | ICD-10-CM

## 2020-04-13 NOTE — Telephone Encounter (Signed)
Attempted to call pt to schedule appt, male answered and stated please remove my number from association with that person, I do not know him., ph# removed. Attempted to call ph# r/t spouse, no answer no vmail

## 2020-04-13 NOTE — Telephone Encounter (Signed)
Front office - please schedule this pt an appt per Dr Evie Lacks.  Thamks

## 2020-04-13 NOTE — Telephone Encounter (Signed)
Good Afternoon Helen,   This patient has not been seen in over a year, I would not feel comfortable writing for this prescription without seeing the patient. If we could schedule him an appointment that would be great!   Thank you!   Dr. Evie Lacks

## 2020-04-21 ENCOUNTER — Other Ambulatory Visit: Payer: Self-pay | Admitting: Internal Medicine

## 2020-04-21 DIAGNOSIS — F1010 Alcohol abuse, uncomplicated: Secondary | ICD-10-CM

## 2020-04-25 NOTE — Telephone Encounter (Signed)
Next appt scheduled 12/1 with PCP. 

## 2020-04-26 ENCOUNTER — Telehealth: Payer: Self-pay | Admitting: *Deleted

## 2020-04-26 ENCOUNTER — Encounter: Payer: Medicare Other | Admitting: Internal Medicine

## 2020-04-26 NOTE — Telephone Encounter (Signed)
Pt was no show to his apt today. Called telephone # on chart - no response nor ringing.

## 2020-05-03 ENCOUNTER — Encounter: Payer: Medicare Other | Admitting: Internal Medicine

## 2020-05-03 ENCOUNTER — Telehealth: Payer: Self-pay

## 2020-09-25 ENCOUNTER — Encounter: Payer: Self-pay | Admitting: Neurology

## 2020-10-08 ENCOUNTER — Other Ambulatory Visit: Payer: Self-pay

## 2020-10-08 ENCOUNTER — Emergency Department (HOSPITAL_COMMUNITY)
Admission: EM | Admit: 2020-10-08 | Discharge: 2020-10-09 | Disposition: A | Discharge status: Home / Self Care | Payer: Medicare Other | Attending: Emergency Medicine | Admitting: Emergency Medicine

## 2020-10-08 ENCOUNTER — Encounter (HOSPITAL_COMMUNITY): Payer: Self-pay

## 2020-10-08 ENCOUNTER — Emergency Department (HOSPITAL_COMMUNITY): Payer: Medicare Other

## 2020-10-08 DIAGNOSIS — Z5321 Procedure and treatment not carried out due to patient leaving prior to being seen by health care provider: Secondary | ICD-10-CM | POA: Insufficient documentation

## 2020-10-08 DIAGNOSIS — I509 Heart failure, unspecified: Secondary | ICD-10-CM | POA: Insufficient documentation

## 2020-10-08 DIAGNOSIS — M7989 Other specified soft tissue disorders: Secondary | ICD-10-CM | POA: Insufficient documentation

## 2020-10-08 LAB — CBC
HCT: 35.4 % — ABNORMAL LOW (ref 39.0–52.0)
Hemoglobin: 11.8 g/dL — ABNORMAL LOW (ref 13.0–17.0)
MCH: 32.7 pg (ref 26.0–34.0)
MCHC: 33.3 g/dL (ref 30.0–36.0)
MCV: 98.1 fL (ref 80.0–100.0)
Platelets: 184 10*3/uL (ref 150–400)
RBC: 3.61 MIL/uL — ABNORMAL LOW (ref 4.22–5.81)
RDW: 16.6 % — ABNORMAL HIGH (ref 11.5–15.5)
WBC: 6.4 10*3/uL (ref 4.0–10.5)
nRBC: 0 % (ref 0.0–0.2)

## 2020-10-08 LAB — BASIC METABOLIC PANEL
Anion gap: 8 (ref 5–15)
BUN: 13 mg/dL (ref 8–23)
CO2: 26 mmol/L (ref 22–32)
Calcium: 9.4 mg/dL (ref 8.9–10.3)
Chloride: 104 mmol/L (ref 98–111)
Creatinine, Ser: 1.49 mg/dL — ABNORMAL HIGH (ref 0.61–1.24)
GFR, Estimated: 51 mL/min — ABNORMAL LOW (ref >60)
Glucose, Bld: 96 mg/dL (ref 70–99)
Potassium: 3.6 mmol/L (ref 3.5–5.1)
Sodium: 138 mmol/L (ref 135–145)

## 2020-10-08 NOTE — ED Triage Notes (Signed)
Patient BIB [facility] from home, reports bilateral feet and leg swelling, hx of CHF, states he is not on any "water pills"

## 2020-10-09 NOTE — ED Notes (Signed)
Patient called x1 for vitals with no response 

## 2020-10-09 NOTE — ED Notes (Signed)
Pt called for vitals x2 with no response 

## 2020-10-18 ENCOUNTER — Encounter: Payer: Self-pay | Admitting: *Deleted

## 2020-11-10 ENCOUNTER — Other Ambulatory Visit: Payer: Self-pay

## 2020-11-10 ENCOUNTER — Encounter (HOSPITAL_COMMUNITY): Payer: Self-pay | Admitting: *Deleted

## 2020-11-10 ENCOUNTER — Emergency Department (HOSPITAL_COMMUNITY)
Admission: EM | Admit: 2020-11-10 | Discharge: 2020-11-11 | Disposition: A | Discharge status: Home / Self Care | Payer: Medicare Other | Attending: Emergency Medicine | Admitting: Emergency Medicine

## 2020-11-10 ENCOUNTER — Emergency Department (HOSPITAL_COMMUNITY): Payer: Medicare Other

## 2020-11-10 DIAGNOSIS — Z79899 Other long term (current) drug therapy: Secondary | ICD-10-CM | POA: Insufficient documentation

## 2020-11-10 DIAGNOSIS — R600 Localized edema: Secondary | ICD-10-CM | POA: Diagnosis not present

## 2020-11-10 DIAGNOSIS — Z7902 Long term (current) use of antithrombotics/antiplatelets: Secondary | ICD-10-CM | POA: Diagnosis not present

## 2020-11-10 DIAGNOSIS — I110 Hypertensive heart disease with heart failure: Secondary | ICD-10-CM | POA: Insufficient documentation

## 2020-11-10 DIAGNOSIS — F17210 Nicotine dependence, cigarettes, uncomplicated: Secondary | ICD-10-CM | POA: Insufficient documentation

## 2020-11-10 DIAGNOSIS — I509 Heart failure, unspecified: Secondary | ICD-10-CM | POA: Insufficient documentation

## 2020-11-10 DIAGNOSIS — M7989 Other specified soft tissue disorders: Secondary | ICD-10-CM | POA: Diagnosis present

## 2020-11-10 LAB — COMPREHENSIVE METABOLIC PANEL WITH GFR
ALT: 75 U/L — ABNORMAL HIGH (ref 0–44)
AST: 64 U/L — ABNORMAL HIGH (ref 15–41)
Albumin: 3.4 g/dL — ABNORMAL LOW (ref 3.5–5.0)
Alkaline Phosphatase: 112 U/L (ref 38–126)
Anion gap: 11 (ref 5–15)
BUN: 25 mg/dL — ABNORMAL HIGH (ref 8–23)
CO2: 26 mmol/L (ref 22–32)
Calcium: 10.1 mg/dL (ref 8.9–10.3)
Chloride: 103 mmol/L (ref 98–111)
Creatinine, Ser: 1.72 mg/dL — ABNORMAL HIGH (ref 0.61–1.24)
GFR, Estimated: 43 mL/min — ABNORMAL LOW
Glucose, Bld: 90 mg/dL (ref 70–99)
Potassium: 3.9 mmol/L (ref 3.5–5.1)
Sodium: 140 mmol/L (ref 135–145)
Total Bilirubin: 0.5 mg/dL (ref 0.3–1.2)
Total Protein: 6.6 g/dL (ref 6.5–8.1)

## 2020-11-10 LAB — CBC WITH DIFFERENTIAL/PLATELET
Abs Immature Granulocytes: 0.02 10*3/uL (ref 0.00–0.07)
Basophils Absolute: 0 10*3/uL (ref 0.0–0.1)
Basophils Relative: 1 %
Eosinophils Absolute: 0.2 10*3/uL (ref 0.0–0.5)
Eosinophils Relative: 4 %
HCT: 35.2 % — ABNORMAL LOW (ref 39.0–52.0)
Hemoglobin: 11.2 g/dL — ABNORMAL LOW (ref 13.0–17.0)
Immature Granulocytes: 0 %
Lymphocytes Relative: 38 %
Lymphs Abs: 1.7 10*3/uL (ref 0.7–4.0)
MCH: 32.3 pg (ref 26.0–34.0)
MCHC: 31.8 g/dL (ref 30.0–36.0)
MCV: 101.4 fL — ABNORMAL HIGH (ref 80.0–100.0)
Monocytes Absolute: 0.8 10*3/uL (ref 0.1–1.0)
Monocytes Relative: 17 %
Neutro Abs: 1.9 10*3/uL (ref 1.7–7.7)
Neutrophils Relative %: 40 %
Platelets: 114 10*3/uL — ABNORMAL LOW (ref 150–400)
RBC: 3.47 MIL/uL — ABNORMAL LOW (ref 4.22–5.81)
RDW: 16.4 % — ABNORMAL HIGH (ref 11.5–15.5)
WBC: 4.5 10*3/uL (ref 4.0–10.5)
nRBC: 0 % (ref 0.0–0.2)

## 2020-11-10 LAB — TROPONIN I (HIGH SENSITIVITY)
Troponin I (High Sensitivity): 18 ng/L — ABNORMAL HIGH
Troponin I (High Sensitivity): 10 ng/L

## 2020-11-10 LAB — BRAIN NATRIURETIC PEPTIDE: B Natriuretic Peptide: 28.3 pg/mL (ref 0.0–100.0)

## 2020-11-10 MED ORDER — FUROSEMIDE 10 MG/ML IJ SOLN
20.0000 mg | Freq: Once | INTRAMUSCULAR | Status: AC
  Administered 2020-11-10: 20 mg via INTRAVENOUS
  Filled 2020-11-10: qty 2

## 2020-11-10 MED ORDER — FUROSEMIDE 20 MG PO TABS: 20.0000 mg | ORAL_TABLET | Freq: Every day | ORAL | 0 refills | Status: DC

## 2020-11-10 NOTE — ED Triage Notes (Signed)
The pt arrived [facility] from home c/o sob for one month with leg swelling  .  Non-compliant with meds cbg 130 by [facility]

## 2020-11-10 NOTE — ED Triage Notes (Signed)
The pt was seen for his feet and legs swelling  for  one month  he was given meds by his doctor but they are not helping

## 2020-11-10 NOTE — ED Provider Notes (Signed)
[facility] EMERGENCY DEPARTMENT Provider Note   CSN: [id number] Arrival date & time: 11/10/20  1638     History Chief Complaint  Patient presents with   Shortness of Breath   swollen feet and ankles    [name] Phillips is a 65 y.o. male.  HPI 65 year old male with a history of congestive heart failure presents with leg swelling.  He states his legs have been bilaterally swollen for about 1 month.  His chief complaint endorses shortness of breath but he denies this.  He denies current chest pain though states he has had on and off chest pain for a while, most recently yesterday.  No fevers or cough.  He states that while he does have congestive heart failure he is not on any type of diuretic.  Past Medical History:  Diagnosis Date     6949 Lead 11/12/2012   Acute kidney injury (HCC) 08/20/2017   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 Catheterization 2005 normal coronary arteries; EF 15-20%; b. 2011 EF normal;  c. 2015 Echo: EF 25-30%;  d. s/p prior AICD with upgrade to MDT single lead ICD ser # [id number] h 2/2 ERI &12/19/2014).   Seizure (HCC)    Seizure disorder (HCC) 08/08/2011    Patient Active Problem List   Diagnosis Date Noted   Hx of Upper GI bleed 08/04/2017   Thrombocytopenia (HCC) 08/04/2017   Unintentional weight loss 06/06/2017   Preventative health care 08/09/2015   ED (erectile dysfunction) of non-organic origin 05/01/2015   Nonischemic cardiomyopathy (HCC)    Alcohol abuse 08/08/2011   Tobacco abuse 08/08/2011   HTN (hypertension) 08/08/2011   Systolic CHF (HCC) 08/08/2011   History of CVA (cerebrovascular accident) 08/08/2011   AICD (automatic cardioverter/defibrillator) present 08/08/2011    Past Surgical  History:  Procedure Laterality Date   CARDIAC DEFIBRILLATOR PLACEMENT  2007   MDT [employer] single chamber ICD implanted with 6949 lead by Dr Ty Hilts for primary prevention   EP IMPLANTABLE DEVICE N/A 12/19/2014   Procedure:  ICD Generator Changeout;  Surgeon: Duke Salvia, MD;  Location: [facility] INVASIVE CV LAB;  Service: Cardiovascular;  Laterality: N/A;   EP IMPLANTABLE DEVICE N/A 12/19/2014   Procedure: Lead Revision;  Surgeon: Duke Salvia, MD;  Location: [facility] INVASIVE CV LAB;  Service: Cardiovascular;  Laterality: N/A;   ESOPHAGOGASTRODUODENOSCOPY (EGD) WITH PROPOFOL N/A 08/06/2017   Procedure: ESOPHAGOGASTRODUODENOSCOPY (EGD) WITH PROPOFOL;  Surgeon: Kathi Der, MD;  Location: [facility] ENDOSCOPY;  Service: Gastroenterology;  Laterality: N/A;   ICD GENERATOR CHANGE  12/19/2014       Family History  Problem Relation Age of Onset   Heart disease Father    Thyroid cancer Mother    Hypertension Other    Diabetes type II Sister    Seizures Brother    Lupus Sister     Social History   Tobacco Use   Smoking status: Every Day    Packs/day: 0.25    Years: 39.00    Pack years: 9.75    Types: Cigarettes, Cigars   Smokeless tobacco: Former    Types: Snuff, Chew   Tobacco comments:    Smoking .25  Smokes cigers   Substance Use Topics   Alcohol use: Yes  Alcohol/week: 3.0 standard drinks    Types: 3 Cans of beer per week    Comment: 2x/month   Drug use: No    Comment: Former crack cocaine use for several years, last 9 years ago    Home Medications Prior to Admission medications   Medication Sig Start Date End Date Taking? Authorizing Provider  furosemide (LASIX) 20 MG tablet Take 1 tablet (20 mg total) by mouth daily. 11/10/20  Yes Pricilla Loveless, MD  acetaminophen (TYLENOL) 325 MG tablet Take 2 tablets (650 mg total) by mouth every 6 (six) hours as needed. Do not take more than 4000mg  of tylenol per day Patient not taking: Reported on 11/25/2017 07/27/17   Couture, Cortni S, PA-C   atorvastatin (LIPITOR) 20 MG tablet Take 1 tablet (20 mg total) by mouth daily. 06/10/19   06/12/19, MD  carvedilol (COREG) 25 MG tablet TAKE 1 TABLET TWICE DAILY WITH MEALS 01/12/18   01/14/18, MD  clopidogrel (PLAVIX) 75 MG tablet Take 1 tablet (75 mg total) by mouth daily. At breakfast 08/14/18   08/16/18, MD  folic acid (FOLVITE) 1 MG tablet Take 1 tablet (1 mg total) by mouth daily. Patient not taking: Reported on 08/04/2017 04/25/17   04/27/17, MD  Multiple Vitamin (MULTIVITAMIN WITH MINERALS) TABS tablet Take 1 tablet by mouth daily.    [provider]  naltrexone (DEPADE) 50 MG tablet TAKE 2 TABLETS BY MOUTH ONCE DAILY *EMERGENCY REFILL* 04/25/20   Katsadouros, 04/27/20, MD  pantoprazole (PROTONIX) 40 MG tablet TAKE 1 TABLET TWICE DAILY 04/15/18   Agyei, 04/17/18, MD  ramipril (ALTACE) 10 MG capsule TAKE 1 CAPSULE TWICE DAILY 01/12/18   01/14/18, MD  spironolactone (ALDACTONE) 25 MG tablet Take 1 tablet (25 mg total) by mouth daily. 06/10/19   06/12/19, MD  tadalafil (CIALIS) 10 MG tablet Take 1 tablet (10 mg total) by mouth as needed for erectile dysfunction. 04/17/18 04/17/19  04/19/19, MD  thiamine (VITAMIN B-1) 100 MG tablet Take 1 tablet (100 mg total) by mouth daily. Patient not taking: Reported on 08/04/2017 06/06/17   08/04/17, MD    Allergies    Codeine, Penicillins, and Prednisone  Review of Systems   Review of Systems  Constitutional:  Negative for fever.  Respiratory:  Negative for shortness of breath.   Cardiovascular:  Positive for chest pain and leg swelling.  Gastrointestinal:  Negative for abdominal distention and abdominal pain.  All other systems reviewed and are negative.  Physical Exam Updated Vital Signs BP (!) 148/103   Pulse 91   Temp 98.3 F (36.8 C) (Oral)   Resp 17   Ht 6\' 3"  (1.905 m)   Wt 82.6 kg   SpO2 94%   BMI 22.76 kg/m   Physical Exam Vitals and nursing note reviewed.   Constitutional:      Appearance: He is well-developed.  HENT:     Head: Normocephalic and atraumatic.     Right Ear: External ear normal.     Left Ear: External ear normal.     Nose: Nose normal.  Eyes:     General:        Right eye: No discharge.        Left eye: No discharge.  Cardiovascular:     Rate and Rhythm: Normal rate and regular rhythm.     Heart sounds: Normal heart sounds.  Pulmonary:     Effort: Pulmonary effort is  normal.     Breath sounds: Normal breath sounds.  Abdominal:     Palpations: Abdomen is soft.     Tenderness: There is no abdominal tenderness.  Musculoskeletal:     Cervical back: Neck supple.     Right lower leg: Edema present.     Left lower leg: Edema present.     Comments: Bilateral pitting edema of both feet/ankles and midway up lower legs  Skin:    General: Skin is warm and dry.  Neurological:     Mental Status: He is alert.  Psychiatric:        Mood and Affect: Mood is not anxious.    ED Results / Procedures / Treatments   Labs (all labs ordered are listed, but only abnormal results are displayed) Labs Reviewed  COMPREHENSIVE METABOLIC PANEL - Abnormal; Notable for the following components:      Result Value   BUN 25 (*)    Creatinine, Ser 1.72 (*)    Albumin 3.4 (*)    AST 64 (*)    ALT 75 (*)    GFR, Estimated 43 (*)    All other components within normal limits  CBC WITH DIFFERENTIAL/PLATELET - Abnormal; Notable for the following components:   RBC 3.47 (*)    Hemoglobin 11.2 (*)    HCT 35.2 (*)    MCV 101.4 (*)    RDW 16.4 (*)    Platelets 114 (*)    All other components within normal limits  TROPONIN I (HIGH SENSITIVITY) - Abnormal; Notable for the following components:   Troponin I (High Sensitivity) 18 (*)    All other components within normal limits  BRAIN NATRIURETIC PEPTIDE  TROPONIN I (HIGH SENSITIVITY)    EKG EKG Interpretation  Date/Time:  Friday November 10 2020 17:25:11 EDT Ventricular Rate:  90 PR  Interval:  204 QRS Duration: 142 QT Interval:  378 QTC Calculation: 462 R Axis:   37 Text Interpretation: Sinus rhythm with occasional ventricular-paced complexes Right bundle branch block Abnormal ECG No significant change since last tracing Confirmed by Susy Frizzle [phone]) on 11/10/2020 6:54:09 PM  Radiology DG Chest 2 View  Result Date: 11/10/2020 CLINICAL DATA:  Shortness of breath and leg swelling.  CHF. EXAM: CHEST - 2 VIEW COMPARISON:  Radiograph 10/08/2020 FINDINGS: Right-sided pacemaker in place. Mild cardiomegaly. Stable mediastinal contours with aortic atherosclerosis no pulmonary edema. No pleural effusion. No focal airspace disease or pneumothorax. No acute osseous abnormalities are seen. Chronic changes about the right shoulder. IMPRESSION: Mild cardiomegaly without pulmonary edema or findings of fluid overload. Electronically Signed   By: Narda Rutherford M.D.   On: 11/10/2020 21:55    Procedures Procedures   Medications Ordered in ED Medications  furosemide (LASIX) injection 20 mg (20 mg Intravenous Given 11/10/20 2301)    ED Course  I have reviewed the triage vital signs and the nursing notes.  Pertinent labs & imaging results that were available during my care of the patient were reviewed by me and considered in my medical decision making (see chart for details).    MDM Rules/Calculators/A&P                          Given patient's history, suspect his leg swelling is from low-level CHF.  He had a mildly elevated troponin on the first sample that seem to be mildly improved on the second.  Likely has a combination of CHF and chronic kidney disease rather than ACS.  I have very low suspicion of PE given no shortness of breath.  We will give Lasix and referred to his cardiologist.  Given the bilateral nature I highly doubt DVT. Final Clinical Impression(s) / ED Diagnoses Final diagnoses:  Bilateral leg edema    Rx / DC Orders ED Discharge Orders           Ordered    furosemide (LASIX) 20 MG tablet  Daily        11/10/20 2314             Pricilla Loveless, MD 11/10/20 2333

## 2020-11-10 NOTE — ED Notes (Signed)
Patient transported to X-ray 

## 2020-12-04 NOTE — Progress Notes (Deleted)
Assessment/Plan:   [name] Phillips is a 65 y.o. year old male with risk factors including  age, CKD, CHF,  hypertension, hyperlipidemia, CAD, NICM sp AICD,  History of CVA, history of grand mal seizures last on  /2021,  alcohol and tobacco abuse,   seen today for evaluation of memory loss.  MoCA today is *** with deficiencies in visuospatial/executive, naming, memory, attention, language, abstraction, delayed recall  /5, orientation  /6    Recommendations:   Memory Loss, likely vascular etiology   CT of the head to evaluate for bleeding, brain size and other abnormalities Neurocognitive testing to further determine what causes memory changes including sleep, stress, anxiety depression Check B12, TSH, B1, CBC Discussed safety both in and out of the home.  Discussed the importance of regular daily schedule with inclusion of crossword puzzles to maintain brain function.  Continue to monitor mood with PCP.  Stay active at least 30 minutes at least 3 times a week.  Naps should be scheduled and should be no longer than 60 minutes and should not occur after 2 PM.  Mediterranean diet is recommended  Folllow up once results above are available   Subjective:    The patient is seen in neurologic consultation at the request of Cullop, Adele Barthel, NP for the evaluation of memory.  The patient is accompanied by {relatives:[id number]} who supplements the history.  The patient is a 65 y.o. year old male who has had memory issues for about    Memory Lives with Mood Depression Irritability Sleeps Vivid Dreams Sleepwalking Bathing Dressing Medications Finances Denies living objects in unusual places. Appetite  trouble swallowing.  Cooks. Denies leaving the stove on or the faucet on. Ambulates without difficulty without walker or cane. Drive with GPS denies getting lost.  Patient no longer drives. Denies headaches, trauma, or injuries to the head, double vision, dizziness, focal numbness or  tingling, unilateral weakness or tremors. Denies urine incontinence or retention. Denies constipation or diarrhea. Denies anosmia. Denies history of OSA;  He drinks ETOH   He partakes 1/4-1/2/ppd Tobacco. Family History neg for Alzheimer's Disease or other dementia  CT head 02/2016  Chronic stable atrophy and moderate chronic periventricular white matter small vessel ischemia.Chronic left occipital lobe infarct. No acute intracranial abnormality.  Labs 11/10/20 Hb 11.2, MCV 101.14, plt 114     EKG 11/10/20 QTC 462  Sinus rhythm with occasional ventricular-paced complexes Right bundle branch block Abnormal ECG No significant change since last tracing  Allergies  Allergen Reactions   Codeine Hives   Penicillins Hives and Itching    Has patient had a PCN reaction causing immediate rash, facial/tongue/throat swelling, SOB or lightheadedness with hypotension: unknown Has patient had a PCN reaction causing severe rash involving mucus membranes or skin necrosis: unknown Has patient had a PCN reaction that required hospitalization: unknown Has patient had a PCN reaction occurring within the last 10 years: no If all of the above answers are "NO", then may proceed with Cephalosporin use.    Prednisone Hives    Current Outpatient Medications  Medication Instructions   acetaminophen (TYLENOL) 650 mg, Oral, Every 6 hours PRN, Do not take more than 4000mg  of tylenol per day   atorvastatin (LIPITOR) 20 mg, Oral, Daily   carvedilol (COREG) 25 MG tablet TAKE 1 TABLET TWICE DAILY WITH MEALS   clopidogrel (PLAVIX) 75 mg, Oral, Daily, At breakfast   folic acid (FOLVITE) 1 mg, Oral, Daily   furosemide (LASIX) 20 mg, Oral, Daily  Multiple Vitamin (MULTIVITAMIN WITH MINERALS) TABS tablet 1 tablet, Oral, Daily   naltrexone (DEPADE) 50 MG tablet TAKE 2 TABLETS BY MOUTH ONCE DAILY *EMERGENCY REFILL*   pantoprazole (PROTONIX) 40 MG tablet TAKE 1 TABLET TWICE DAILY   ramipril (ALTACE) 10 MG capsule TAKE 1  CAPSULE TWICE DAILY   spironolactone (ALDACTONE) 25 mg, Oral, Daily   tadalafil (CIALIS) 10 mg, Oral, As needed   thiamine (VITAMIN B-1) 100 mg, Oral, Daily     VITALS:  There were no vitals filed for this visit.  HEENT:  Normocephalic, atraumatic. The mucous membranes are moist. The superficial temporal arteries are without ropiness or tenderness. Cardiovascular: Regular rate and rhythm. Lungs: Clear to auscultation bilaterally. Neck: There are no carotid bruits noted bilaterally.  NEUROLOGICAL:  Orientation:  No flowsheet data found. Alert and oriented to person, place and time. No aphasia or dysarthria. Fund of knowledge is appropriate. Recent memory impaired and remote memory intact.  Attention and concentration are normal.  Able to name objects and repeat phrases. Delayed recall  /5 Cranial nerves: There is good facial symmetry. Extraocular muscles are intact and visual fields are full to confrontational testing. Speech is fluent and clear. Soft palate rises symmetrically and there is no tongue deviation. Hearing is intact to conversational tone. Tone: Tone is good throughout. Sensation: Sensation is intact to light touch and pinprick throughout. Vibration is intact at the bilateral big toe.There is no extinction with double simultaneous stimulation. There is no sensory dermatomal level identified. Coordination: The patient has no difficulty with RAM's or FNF bilaterally. Normal finger to nose  Motor: Strength is 5/5 in the bilateral upper and lower extremities. There is no pronator drift. There are no fasciculations noted. DTR's: Deep tendon reflexes are 2/4 at the bilateral biceps, triceps, brachioradialis, patella and achilles.  Plantar responses are downgoing bilaterally. Gait and Station: The patient is able to ambulate without difficulty.The patient is able to heel toe walk without any difficulty.The patient is able to ambulate in a tandem fashion. The patient is able to stand in  the Romberg position.   CBC Latest Ref Rng & Units 11/10/2020 10/08/2020 01/13/2019  WBC 4.0 - 10.5 K/uL 4.5 6.4 5.9  Hemoglobin 13.0 - 17.0 g/dL 11.2(L) 11.8(L) 11.2(L)  Hematocrit 39.0 - 52.0 % 35.2(L) 35.4(L) 34.7(L)  Platelets 150 - 400 K/uL 114(L) 184 166     CMP Latest Ref Rng & Units 11/10/2020 10/08/2020 02/22/2019  Glucose 70 - 99 mg/dL 90 96 250(I)  BUN 8 - 23 mg/dL 37(C) 13 48(G)  Creatinine 0.61 - 1.24 mg/dL 8.91(Q) 9.45(W) 3.88(E)  Sodium 135 - 145 mmol/L 140 138 146(H)  Potassium 3.5 - 5.1 mmol/L 3.9 3.6 4.9  Chloride 98 - 111 mmol/L 103 104 108(H)  CO2 22 - 32 mmol/L 26 26 22   Calcium 8.9 - 10.3 mg/dL 9.4 28.0  Total Protein 6.5 - 8.1 g/dL 6.6 - -  Total Bilirubin 0.3 - 1.2 mg/dL 0.5 - -  Alkaline Phos 38 - 126 U/L 112 - -  AST 15 - 41 U/L 64(H) - -  ALT 0 - 44 U/L 75(H) - -      Thank you for allowing 03.4 the opportunity to participate in the care of this nice patient. Please do not hesitate to contact us for any questions or concerns.   Total time spent on today's visit was *** 60 minutes, including both face-to-face time and nonface-to-face time.  Time included that spent on review of records (prior notes available  to me/labs/imaging if pertinent), discussing treatment and goals, answering patient's questions and coordinating care.  Cc:  Shanna Cisco, NP  Marlowe Kays 12/04/2020 7:33 AM

## 2020-12-15 ENCOUNTER — Ambulatory Visit: Payer: Medicare Other | Admitting: Neurology

## 2020-12-15 DIAGNOSIS — R413 Other amnesia: Secondary | ICD-10-CM

## 2021-02-28 ENCOUNTER — Other Ambulatory Visit: Payer: Self-pay | Admitting: Student

## 2021-02-28 DIAGNOSIS — R1011 Right upper quadrant pain: Secondary | ICD-10-CM

## 2021-03-09 ENCOUNTER — Emergency Department (HOSPITAL_COMMUNITY): Payer: Medicare Other

## 2021-03-09 ENCOUNTER — Other Ambulatory Visit: Payer: Self-pay

## 2021-03-09 ENCOUNTER — Emergency Department (HOSPITAL_COMMUNITY)
Admission: EM | Admit: 2021-03-09 | Discharge: 2021-03-09 | Disposition: A | Discharge status: Home / Self Care | Payer: Medicare Other | Attending: Emergency Medicine | Admitting: Emergency Medicine

## 2021-03-09 ENCOUNTER — Encounter (HOSPITAL_COMMUNITY): Payer: Self-pay

## 2021-03-09 DIAGNOSIS — I110 Hypertensive heart disease with heart failure: Secondary | ICD-10-CM | POA: Insufficient documentation

## 2021-03-09 DIAGNOSIS — R109 Unspecified abdominal pain: Secondary | ICD-10-CM | POA: Diagnosis not present

## 2021-03-09 DIAGNOSIS — F17210 Nicotine dependence, cigarettes, uncomplicated: Secondary | ICD-10-CM | POA: Diagnosis not present

## 2021-03-09 DIAGNOSIS — I5020 Unspecified systolic (congestive) heart failure: Secondary | ICD-10-CM | POA: Diagnosis not present

## 2021-03-09 LAB — COMPREHENSIVE METABOLIC PANEL
ALT: 55 U/L — ABNORMAL HIGH (ref 0–44)
AST: 56 U/L — ABNORMAL HIGH (ref 15–41)
Albumin: 4 g/dL (ref 3.5–5.0)
Alkaline Phosphatase: 124 U/L (ref 38–126)
Anion gap: 10 (ref 5–15)
BUN: 18 mg/dL (ref 8–23)
CO2: 24 mmol/L (ref 22–32)
Calcium: 9.5 mg/dL (ref 8.9–10.3)
Chloride: 101 mmol/L (ref 98–111)
Creatinine, Ser: 1.54 mg/dL — ABNORMAL HIGH (ref 0.61–1.24)
GFR, Estimated: 49 mL/min — ABNORMAL LOW (ref >60)
Glucose, Bld: 116 mg/dL — ABNORMAL HIGH (ref 70–99)
Potassium: 4.1 mmol/L (ref 3.5–5.1)
Sodium: 135 mmol/L (ref 135–145)
Total Bilirubin: 0.7 mg/dL (ref 0.3–1.2)
Total Protein: 7.9 g/dL (ref 6.5–8.1)

## 2021-03-09 LAB — CBC
HCT: 35.5 % — ABNORMAL LOW (ref 39.0–52.0)
Hemoglobin: 11.7 g/dL — ABNORMAL LOW (ref 13.0–17.0)
MCH: 32.1 pg (ref 26.0–34.0)
MCHC: 33 g/dL (ref 30.0–36.0)
MCV: 97.5 fL (ref 80.0–100.0)
Platelets: 135 10*3/uL — ABNORMAL LOW (ref 150–400)
RBC: 3.64 MIL/uL — ABNORMAL LOW (ref 4.22–5.81)
RDW: 14.6 % (ref 11.5–15.5)
WBC: 5 10*3/uL (ref 4.0–10.5)
nRBC: 0 % (ref 0.0–0.2)

## 2021-03-09 LAB — URINALYSIS, ROUTINE W REFLEX MICROSCOPIC
Bilirubin Urine: NEGATIVE
Glucose, UA: NEGATIVE mg/dL
Hgb urine dipstick: NEGATIVE
Ketones, ur: NEGATIVE mg/dL
Leukocytes,Ua: NEGATIVE
Nitrite: NEGATIVE
Protein, ur: NEGATIVE mg/dL
Specific Gravity, Urine: 1.013 (ref 1.005–1.030)
pH: 5 (ref 5.0–8.0)

## 2021-03-09 LAB — LIPASE, BLOOD: Lipase: 49 U/L (ref 11–51)

## 2021-03-09 MED ORDER — LIDOCAINE 5 % EX PTCH
1.0000 | MEDICATED_PATCH | Freq: Once | CUTANEOUS | Status: DC
  Administered 2021-03-09: 1 via TRANSDERMAL
  Filled 2021-03-09: qty 1

## 2021-03-09 MED ORDER — LIDOCAINE 5 % EX PTCH: 1.0000 | MEDICATED_PATCH | CUTANEOUS | 0 refills | Status: DC

## 2021-03-09 MED ORDER — IOHEXOL 350 MG/ML SOLN
70.0000 mL | Freq: Once | INTRAVENOUS | Status: AC | PRN
  Administered 2021-03-09: 70 mL via INTRAVENOUS

## 2021-03-09 MED ORDER — FENTANYL CITRATE PF 50 MCG/ML IJ SOSY
50.0000 ug | PREFILLED_SYRINGE | Freq: Once | INTRAMUSCULAR | Status: AC
  Administered 2021-03-09: 50 ug via INTRAVENOUS
  Filled 2021-03-09: qty 1

## 2021-03-09 NOTE — ED Provider Notes (Signed)
[facility]-EMERGENCY DEPT Provider Note   CSN: [id number] Arrival date & time: 03/09/21  1408     History Chief Complaint  Patient presents with   Abdominal Pain    [name] Phillips is a 65 y.o. male.  HPI 65 year old male presents for right side pain.  This has been ongoing for about 3 or 4 months.  Previous to this he had pain on the left side.  He has been having daily pain that does not seem to get better or worse with anything in particular during these months.  He has a hard time describing what the pain feels like.  He has not taken thing specific for besides what ever he is prescribed.  He states he was taken off oxycodone for 5 months ago and is not sure if this is related.  No fevers, vomiting, rash, weakness or numbness in the extremities, diarrhea or abdominal pain.  Today when he went to the optometrist office before he get out of the car he notices severe worsening of the pain when he tried to sit up.  Could not get out of his car.  Pain is not as bad as it was then but is still rated as a 7.  Past Medical History:  Diagnosis Date     6949 Lead 11/12/2012   Acute kidney injury (HCC) 08/20/2017   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 Catheterization 2005 normal coronary arteries; EF 15-20%; b. 2011 EF normal;  c. 2015 Echo: EF 25-30%;  d. s/p prior AICD with upgrade to MDT single lead ICD ser # [id number] h 2/2 ERI &12/19/2014).   Seizure (HCC)    Seizure disorder (HCC) 08/08/2011    Patient Active Problem List   Diagnosis Date Noted   Hx of Upper GI bleed 08/04/2017   Thrombocytopenia (HCC) 08/04/2017   Unintentional weight loss 06/06/2017   Preventative health care 08/09/2015   ED (erectile dysfunction) of  non-organic origin 05/01/2015   Nonischemic cardiomyopathy (HCC)    Alcohol abuse 08/08/2011   Tobacco abuse 08/08/2011   HTN (hypertension) 08/08/2011   Systolic CHF (HCC) 08/08/2011   History of CVA (cerebrovascular accident) 08/08/2011   AICD (automatic cardioverter/defibrillator) present 08/08/2011    Past Surgical History:  Procedure Laterality Date   CARDIAC DEFIBRILLATOR PLACEMENT  2007   MDT [employer] single chamber ICD implanted with 6949 lead by Dr Ty Hilts for primary prevention   EP IMPLANTABLE DEVICE N/A 12/19/2014   Procedure:  ICD Generator Changeout;  Surgeon: Duke Salvia, MD;  Location: [facility] INVASIVE CV LAB;  Service: Cardiovascular;  Laterality: N/A;   EP IMPLANTABLE DEVICE N/A 12/19/2014   Procedure: Lead Revision;  Surgeon: Duke Salvia, MD;  Location: [facility] INVASIVE CV LAB;  Service: Cardiovascular;  Laterality: N/A;   ESOPHAGOGASTRODUODENOSCOPY (EGD) WITH PROPOFOL N/A 08/06/2017   Procedure: ESOPHAGOGASTRODUODENOSCOPY (EGD) WITH PROPOFOL;  Surgeon: Kathi Der, MD;  Location: [facility] ENDOSCOPY;  Service: Gastroenterology;  Laterality: N/A;   ICD GENERATOR CHANGE  12/19/2014       Family History  Problem Relation Age of Onset   Heart disease Father    Thyroid cancer Mother    Hypertension Other    Diabetes type II Sister    Seizures Brother  Lupus Sister     Social History   Tobacco Use   Smoking status: Every Day    Packs/day: 0.25    Years: 39.00    Pack years: 9.75    Types: Cigarettes, Cigars   Smokeless tobacco: Former    Types: Snuff, Chew   Tobacco comments:    Smoking .25  Smokes cigers   Substance Use Topics   Alcohol use: Yes    Alcohol/week: 3.0 standard drinks    Types: 3 Cans of beer per week    Comment: 2x/month   Drug use: No    Comment: Former crack cocaine use for several years, last 9 years ago    Home Medications Prior to Admission medications   Medication Sig Start Date End Date Taking? Authorizing Provider  lidocaine  (LIDODERM) 5 % Place 1 patch onto the skin daily. Remove & Discard patch within 12 hours or as directed by MD 03/09/21  Yes Pricilla Loveless, MD  acetaminophen (TYLENOL) 325 MG tablet Take 2 tablets (650 mg total) by mouth every 6 (six) hours as needed. Do not take more than 4000mg  of tylenol per day Patient not taking: Reported on 11/25/2017 07/27/17   Couture, Cortni S, PA-C  atorvastatin (LIPITOR) 20 MG tablet Take 1 tablet (20 mg total) by mouth daily. 06/10/19   06/12/19, MD  carvedilol (COREG) 25 MG tablet TAKE 1 TABLET TWICE DAILY WITH MEALS 01/12/18   01/14/18, MD  clopidogrel (PLAVIX) 75 MG tablet Take 1 tablet (75 mg total) by mouth daily. At breakfast 08/14/18   08/16/18, MD  folic acid (FOLVITE) 1 MG tablet Take 1 tablet (1 mg total) by mouth daily. Patient not taking: Reported on 08/04/2017 04/25/17   04/27/17, MD  furosemide (LASIX) 20 MG tablet Take 1 tablet (20 mg total) by mouth daily. 11/10/20   11/12/20, MD  Multiple Vitamin (MULTIVITAMIN WITH MINERALS) TABS tablet Take 1 tablet by mouth daily.    [provider]  naltrexone (DEPADE) 50 MG tablet TAKE 2 TABLETS BY MOUTH ONCE DAILY *EMERGENCY REFILL* 04/25/20   Katsadouros, 04/27/20, MD  pantoprazole (PROTONIX) 40 MG tablet TAKE 1 TABLET TWICE DAILY 04/15/18   Agyei, 04/17/18, MD  ramipril (ALTACE) 10 MG capsule TAKE 1 CAPSULE TWICE DAILY 01/12/18   01/14/18, MD  spironolactone (ALDACTONE) 25 MG tablet Take 1 tablet (25 mg total) by mouth daily. 06/10/19   06/12/19, MD  tadalafil (CIALIS) 10 MG tablet Take 1 tablet (10 mg total) by mouth as needed for erectile dysfunction. 04/17/18 04/17/19  04/19/19, MD  thiamine (VITAMIN B-1) 100 MG tablet Take 1 tablet (100 mg total) by mouth daily. Patient not taking: Reported on 08/04/2017 06/06/17   08/04/17, MD    Allergies    Codeine, Penicillins, and Prednisone  Review of Systems   Review of Systems  Constitutional:  Negative  for fever.  Gastrointestinal:  Negative for abdominal pain, diarrhea and vomiting.  Genitourinary:  Positive for flank pain. Negative for dysuria and hematuria.  Musculoskeletal:  Positive for back pain.  Neurological:  Negative for weakness and numbness.  All other systems reviewed and are negative.  Physical Exam Updated Vital Signs BP 121/84   Pulse 68   Temp 98 F (36.7 C) (Oral)   Resp 18   Ht 6\' 3"  (1.905 m)   Wt 82.6 kg   SpO2 100%   BMI 22.75 kg/m   Physical Exam  Vitals and nursing note reviewed.  Constitutional:      General: He is not in acute distress.    Appearance: He is well-developed. He is not ill-appearing or diaphoretic.     Comments: Sitting up induces pain in patient's right back/flank  HENT:     Head: Normocephalic and atraumatic.     Right Ear: External ear normal.     Left Ear: External ear normal.     Nose: Nose normal.  Eyes:     General:        Right eye: No discharge.        Left eye: No discharge.  Cardiovascular:     Rate and Rhythm: Normal rate and regular rhythm.     Heart sounds: Normal heart sounds.  Pulmonary:     Effort: Pulmonary effort is normal.     Breath sounds: Normal breath sounds.  Abdominal:     Palpations: Abdomen is soft.     Tenderness: There is no abdominal tenderness. There is right CVA tenderness.    Musculoskeletal:     Cervical back: Neck supple.  Skin:    General: Skin is warm and dry.  Neurological:     Mental Status: He is alert.  Psychiatric:        Mood and Affect: Mood is not anxious.    ED Results / Procedures / Treatments   Labs (all labs ordered are listed, but only abnormal results are displayed) Labs Reviewed  COMPREHENSIVE METABOLIC PANEL - Abnormal; Notable for the following components:      Result Value   Glucose, Bld 116 (*)    Creatinine, Ser 1.54 (*)    AST 56 (*)    ALT 55 (*)    GFR, Estimated 49 (*)    All other components within normal limits  CBC - Abnormal; Notable for the  following components:   RBC 3.64 (*)    Hemoglobin 11.7 (*)    HCT 35.5 (*)    Platelets 135 (*)    All other components within normal limits  LIPASE, BLOOD  URINALYSIS, ROUTINE W REFLEX MICROSCOPIC    EKG None  Radiology CT ABDOMEN PELVIS W CONTRAST  Result Date: 03/09/2021 CLINICAL DATA:  Pyelonephritis, complicated. EXAM: CT ABDOMEN AND PELVIS WITH CONTRAST TECHNIQUE: Multidetector CT imaging of the abdomen and pelvis was performed using the standard protocol following bolus administration of intravenous contrast. CONTRAST:  81mL OMNIPAQUE IOHEXOL 350 MG/ML SOLN COMPARISON:  08/06/2017 abdominal ultrasound. Abdominopelvic CT 05/14/2012 FINDINGS: Lower chest: Left base scarring. Mild cardiomegaly. Lad and left circumflex coronary artery calcification. Pacer. Hepatobiliary: Moderate hepatic steatosis. Normal gallbladder, without biliary ductal dilatation. Pancreas: Pancreatic parenchymal calcifications within the uncinate process. No duct dilatation or acute inflammation. Spleen: Normal in size, without focal abnormality. Adrenals/Urinary Tract: Normal right adrenal gland. Mild left adrenal thickening. 3.0 cm interpolar right renal cyst. No hydronephrosis. Normal left kidney. Normal urinary bladder. Stomach/Bowel: Apparent gastric antral soft tissue fullness is favored to be due to underdistention, as it is less impressive on renal delays. Colonic stool burden suggests constipation. Normal terminal ileum. Appendix is normal including on coronal image 80. Normal small bowel. Vascular/Lymphatic: Aortic atherosclerosis. No abdominopelvic adenopathy. Reproductive: Normal prostate. Other: No significant free fluid. No free intraperitoneal air. Ventral abdominal wall laxity contains fat. Musculoskeletal: Mild right-sided gynecomastia. Degenerative partial fusion of bilateral sacroiliac joints. IMPRESSION: 1. No acute process in the abdomen or pelvis. 2. Possible constipation. 3. Hepatic steatosis. 4.  Chronic calcific pancreatitis. 5. Coronary artery atherosclerosis. 6.  Aortic Atherosclerosis (ICD10-I70.0). Electronically Signed   By: Jeronimo Greaves M.D.   On: 03/09/2021 21:16    Procedures Procedures   Medications Ordered in ED Medications  lidocaine (LIDODERM) 5 % 1 patch (1 patch Transdermal Patch Applied 03/09/21 2219)  fentaNYL (SUBLIMAZE) injection 50 mcg (50 mcg Intravenous Given 03/09/21 2018)  iohexol (OMNIPAQUE) 350 MG/ML injection 70 mL (70 mLs Intravenous Contrast Given 03/09/21 2051)    ED Course  I have reviewed the triage vital signs and the nursing notes.  Pertinent labs & imaging results that were available during my care of the patient were reviewed by me and considered in my medical decision making (see chart for details).    MDM Rules/Calculators/A&P                           Unclear cause of patient's chronic but now worsening right flank pain.  Labs including urine are all unremarkable.  He does have a interpolar right renal cyst but that seems unlikely to be causing acute pain.  Perhaps this is muscular?  He has chronic pain issues and is telling me he did have some left flank pain before the right flank pain.  Overall I do not suspect an emergent condition and think he can be discharged.  We will give some Lidoderm to see if this helps.  Given return precautions. Final Clinical Impression(s) / ED Diagnoses Final diagnoses:  Right flank pain    Rx / DC Orders ED Discharge Orders          Ordered    lidocaine (LIDODERM) 5 %  Every 24 hours        03/09/21 2155             Pricilla Loveless, MD 03/09/21 2225

## 2021-03-09 NOTE — Discharge Instructions (Signed)
If you develop worsening, recurrent, or continued back pain, numbness or weakness in the legs, incontinence of your bowels or bladders, numbness of your buttocks, fever, abdominal pain, or any other new/concerning symptoms then return to the ER for evaluation.  

## 2021-03-09 NOTE — ED Provider Notes (Signed)
Emergency Medicine Provider Triage Evaluation Note  [name] Phillips , a 64 y.o. male  was evaluated in triage.  Pt complains of right-sided flank pain that has been going on for 2 to 3 months.  It was worse today when he tried to get out of his car.  No urinary symptoms.  Sent by his doctor for CT scan  Review of Systems  Positive: Right-sided flank pain. Negative: Fevers, chills, abdominal pain, nausea, vomiting  Physical Exam  BP 94/74 (BP Location: Right Arm)   Pulse 79   Temp 98 F (36.7 C) (Oral)   Resp 18   Ht 6\' 3"  (1.905 m)   Wt 82.6 kg   SpO2 98%   BMI 22.75 kg/m  Gen:   Awake, no distress   Resp:  Normal effort  MSK:   Moves extremities without difficulty  Other:  Patient is without abdominal pain.  Pain seems to be localized to right flank.  Negative CVA tenderness.  Medical Decision Making  Medically screening exam initiated at 2:49 PM.  Appropriate orders placed.  [name] Phillips was informed that the remainder of the evaluation will be completed by another provider, this initial triage assessment does not replace that evaluation, and the importance of remaining in the ED until their evaluation is complete.  Does not appear to be an abdominal processes.  Blood work ordered, held CT   [name] Beams, PA-C 03/09/21 1451    03/11/21, MD 03/09/21 1556

## 2021-03-09 NOTE — ED Triage Notes (Signed)
Pt c/o RUQ pain x 2-3 months. Pt denies n/v/d. Pt states pain was worse today than before, couldn't get out of car. Pt states he was supposed to have a CT for his "pancreas  and liver" but never received call back.

## 2021-03-10 ENCOUNTER — Other Ambulatory Visit: Payer: Self-pay

## 2021-03-10 ENCOUNTER — Encounter (HOSPITAL_COMMUNITY): Payer: Self-pay | Admitting: Emergency Medicine

## 2021-03-10 ENCOUNTER — Emergency Department (HOSPITAL_COMMUNITY)
Admission: EM | Admit: 2021-03-10 | Discharge: 2021-03-11 | Disposition: A | Discharge status: Home / Self Care | Payer: Medicare Other | Attending: Emergency Medicine | Admitting: Emergency Medicine

## 2021-03-10 DIAGNOSIS — I110 Hypertensive heart disease with heart failure: Secondary | ICD-10-CM | POA: Diagnosis not present

## 2021-03-10 DIAGNOSIS — F17210 Nicotine dependence, cigarettes, uncomplicated: Secondary | ICD-10-CM | POA: Diagnosis not present

## 2021-03-10 DIAGNOSIS — Z79899 Other long term (current) drug therapy: Secondary | ICD-10-CM | POA: Insufficient documentation

## 2021-03-10 DIAGNOSIS — I5020 Unspecified systolic (congestive) heart failure: Secondary | ICD-10-CM | POA: Diagnosis not present

## 2021-03-10 DIAGNOSIS — M62830 Muscle spasm of back: Secondary | ICD-10-CM | POA: Diagnosis not present

## 2021-03-10 DIAGNOSIS — Z7901 Long term (current) use of anticoagulants: Secondary | ICD-10-CM | POA: Diagnosis not present

## 2021-03-10 DIAGNOSIS — M545 Low back pain, unspecified: Secondary | ICD-10-CM | POA: Diagnosis present

## 2021-03-10 MED ORDER — HYDROMORPHONE HCL 1 MG/ML IJ SOLN
0.5000 mg | Freq: Once | INTRAMUSCULAR | Status: AC
  Administered 2021-03-10: 0.5 mg via INTRAVENOUS
  Filled 2021-03-10: qty 1

## 2021-03-10 MED ORDER — LIDOCAINE 5 % EX PTCH
1.0000 | MEDICATED_PATCH | CUTANEOUS | Status: DC
  Administered 2021-03-10: 1 via TRANSDERMAL
  Filled 2021-03-10: qty 1

## 2021-03-10 MED ORDER — METHOCARBAMOL 1000 MG/10ML IJ SOLN
500.0000 mg | Freq: Once | INTRAVENOUS | Status: AC
  Administered 2021-03-10: 500 mg via INTRAVENOUS
  Filled 2021-03-10: qty 500

## 2021-03-10 MED ORDER — DIAZEPAM 5 MG PO TABS: 10.0000 mg | ORAL_TABLET | Freq: Once | ORAL | Status: DC

## 2021-03-10 MED ORDER — DIAZEPAM 5 MG PO TABS: 5.0000 mg | ORAL_TABLET | Freq: Once | ORAL | Status: DC

## 2021-03-10 MED ORDER — DIAZEPAM 5 MG PO TABS
5.0000 mg | ORAL_TABLET | Freq: Once | ORAL | Status: AC
  Administered 2021-03-10: 5 mg via ORAL
  Filled 2021-03-10: qty 1

## 2021-03-10 MED ORDER — ONDANSETRON HCL 4 MG/2ML IJ SOLN
4.0000 mg | Freq: Once | INTRAMUSCULAR | Status: AC
  Administered 2021-03-10: 4 mg via INTRAVENOUS
  Filled 2021-03-10: qty 2

## 2021-03-10 NOTE — ED Triage Notes (Signed)
Patient presents from home with back pain (7/10). Seen about 2 days ago, but treatment did not help.  EMS vitals: 122/92 BP 91 HR 96% SPO2 on room air

## 2021-03-10 NOTE — Discharge Instructions (Signed)
You were given a prescription for Robaxin which is a muscle relaxer.  You should not drive, work, or operate machinery while taking this medication as it can make you very drowsy.    Please follow up with your primary care provider within 5-7 days for re-evaluation of your symptoms. If you do not have a primary care provider, information for a healthcare clinic has been provided for you to make arrangements for follow up care. Please return to the emergency department for any new or worsening symptoms.  

## 2021-03-10 NOTE — ED Notes (Signed)
Pt's spouse Sheppard Plumber called to inform her of his discharge and the medication that were sent to his pharmacy. Awaiting her arrival.

## 2021-03-10 NOTE — ED Provider Notes (Addendum)
I provided a substantive portion of the care of this patient.  I personally performed the entirety of the medical decision making for this encounter.      65 year old male presents with right lower back and flank pain.  No recent history of trauma.  Seen yesterday had thorough work-up.  On exam here, he is tender along his right flank area.  Will medicate and likely discharge  10:24 PM   Given Dilaudid and Valium with slight improvement.  Will redose Dilaudid and give IV Robaxin and reassess.  Will sign out to next provider   Lorre Nick, MD 03/10/21 2126    Lorre Nick, MD 03/10/21 2224

## 2021-03-10 NOTE — ED Notes (Addendum)
Assumed care of pt. AAOX4. Pt in no apparent distress with moderate pain with movement. Awaiting further orders. Pt states he is unable to ambulate at this time, but he was able to move himself up and back in the wheelchair. Dr. Pecola Leisure made aware.

## 2021-03-10 NOTE — ED Provider Notes (Signed)
[facility]-EMERGENCY DEPT Provider Note   CSN: [id number] Arrival date & time: 03/10/21  2029     History Chief Complaint  Patient presents with   Back Pain    [name] Phillips is a 65 y.o. male.  HPI  65 year old male with a history of AKI, AICD, alcohol abuse, arthritis, CHF, CVA, hypertension, nonischemic cardiomyopathy, seizure disorder, presents the emergency department today for evaluation of back/flank pain.  He tells me the pain has been ongoing for the last 9 months.  Pain is constant nature and worse with certain movements.  He has had some difficulty walking due to the severity of pain.  He denies any associated numbness or weakness in the leg or loss control of his bowel or bladder function.  Denies any history of IVDU, cancer, fevers or other systemic complaints.  Denies any chest pain or shortness of breath.  He was seen in the ED yesterday and had a thorough evaluation including labs and a CT abdomen/pelvis that was grossly unremarkable.  It did not show any bony abnormalities.  His pain was felt to be MSK in nature and he was discharged with lidocaine patches.  Patient has not filled this prescription.  Past Medical History:  Diagnosis Date     6949 Lead 11/12/2012   Acute kidney injury (HCC) 08/20/2017   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 Catheterization 2005 normal coronary arteries; EF 15-20%; b. 2011 EF normal;  c. 2015 Echo: EF 25-30%;  d. s/p prior AICD with upgrade to MDT single lead ICD ser # [id number] h 2/2 ERI &12/19/2014).   Seizure (HCC)    Seizure disorder (HCC) 08/08/2011    Patient Active Problem List   Diagnosis Date Noted   Hx of Upper GI bleed 08/04/2017   Thrombocytopenia (HCC) 08/04/2017    Unintentional weight loss 06/06/2017   Preventative health care 08/09/2015   ED (erectile dysfunction) of non-organic origin 05/01/2015   Nonischemic cardiomyopathy (HCC)    Alcohol abuse 08/08/2011   Tobacco abuse 08/08/2011   HTN (hypertension) 08/08/2011   Systolic CHF (HCC) 08/08/2011   History of CVA (cerebrovascular accident) 08/08/2011   AICD (automatic cardioverter/defibrillator) present 08/08/2011    Past Surgical History:  Procedure Laterality Date   CARDIAC DEFIBRILLATOR PLACEMENT  2007   MDT [employer] single chamber ICD implanted with 6949 lead by Dr Ty Hilts for primary prevention   EP IMPLANTABLE DEVICE N/A 12/19/2014   Procedure:  ICD Generator Changeout;  Surgeon: Duke Salvia, MD;  Location: [facility] INVASIVE CV LAB;  Service: Cardiovascular;  Laterality: N/A;   EP IMPLANTABLE DEVICE N/A 12/19/2014   Procedure: Lead Revision;  Surgeon: Duke Salvia, MD;  Location: [facility] INVASIVE CV LAB;  Service: Cardiovascular;  Laterality: N/A;   ESOPHAGOGASTRODUODENOSCOPY (EGD) WITH PROPOFOL N/A 08/06/2017   Procedure: ESOPHAGOGASTRODUODENOSCOPY (EGD) WITH PROPOFOL;  Surgeon: Kathi Der, MD;  Location: [facility] ENDOSCOPY;  Service: Gastroenterology;  Laterality: N/A;   ICD GENERATOR CHANGE  12/19/2014       Family History  Problem Relation Age of Onset   Heart disease Father    Thyroid cancer Mother    Hypertension Other    Diabetes type II Sister    Seizures Brother    Lupus Sister  Social History   Tobacco Use   Smoking status: Every Day    Packs/day: 0.25    Years: 39.00    Pack years: 9.75    Types: Cigarettes, Cigars   Smokeless tobacco: Former    Types: Snuff, Chew   Tobacco comments:    Smoking .25  Smokes cigers   Substance Use Topics   Alcohol use: Yes    Alcohol/week: 3.0 standard drinks    Types: 3 Cans of beer per week    Comment: 2x/month   Drug use: No    Comment: Former crack cocaine use for several years, last 9 years ago    Home Medications Prior  to Admission medications   Medication Sig Start Date End Date Taking? Authorizing Provider  acetaminophen (TYLENOL) 325 MG tablet Take 2 tablets (650 mg total) by mouth every 6 (six) hours as needed. Do not take more than 4000mg  of tylenol per day Patient not taking: Reported on 11/25/2017 07/27/17   Natalee Tomkiewicz S, PA-C  atorvastatin (LIPITOR) 20 MG tablet Take 1 tablet (20 mg total) by mouth daily. 06/10/19   06/12/19, MD  carvedilol (COREG) 25 MG tablet TAKE 1 TABLET TWICE DAILY WITH MEALS 01/12/18   01/14/18, MD  clopidogrel (PLAVIX) 75 MG tablet Take 1 tablet (75 mg total) by mouth daily. At breakfast 08/14/18   08/16/18, MD  folic acid (FOLVITE) 1 MG tablet Take 1 tablet (1 mg total) by mouth daily. Patient not taking: Reported on 08/04/2017 04/25/17   04/27/17, MD  furosemide (LASIX) 20 MG tablet Take 1 tablet (20 mg total) by mouth daily. 11/10/20   11/12/20, MD  lidocaine (LIDODERM) 5 % Place 1 patch onto the skin daily. Remove & Discard patch within 12 hours or as directed by MD 03/09/21   03/11/21, MD  Multiple Vitamin (MULTIVITAMIN WITH MINERALS) TABS tablet Take 1 tablet by mouth daily.    [provider]  naltrexone (DEPADE) 50 MG tablet TAKE 2 TABLETS BY MOUTH ONCE DAILY *EMERGENCY REFILL* 04/25/20   Katsadouros, 04/27/20, MD  pantoprazole (PROTONIX) 40 MG tablet TAKE 1 TABLET TWICE DAILY 04/15/18   Agyei, 04/17/18, MD  ramipril (ALTACE) 10 MG capsule TAKE 1 CAPSULE TWICE DAILY 01/12/18   01/14/18, MD  spironolactone (ALDACTONE) 25 MG tablet Take 1 tablet (25 mg total) by mouth daily. 06/10/19   06/12/19, MD  tadalafil (CIALIS) 10 MG tablet Take 1 tablet (10 mg total) by mouth as needed for erectile dysfunction. 04/17/18 04/17/19  04/19/19, MD  thiamine (VITAMIN B-1) 100 MG tablet Take 1 tablet (100 mg total) by mouth daily. Patient not taking: Reported on 08/04/2017 06/06/17   08/04/17, MD    Allergies     Codeine, Penicillins, and Prednisone  Review of Systems   Review of Systems  Constitutional:  Negative for fever.  HENT:  Negative for ear pain and sore throat.   Eyes:  Negative for visual disturbance.  Respiratory:  Negative for cough and shortness of breath.   Cardiovascular:  Negative for chest pain.  Gastrointestinal:  Negative for abdominal pain, constipation, diarrhea, nausea and vomiting.  Genitourinary:  Positive for flank pain. Negative for dysuria and hematuria.  Musculoskeletal:  Positive for back pain. Negative for arthralgias.  Skin:  Negative for rash.  Neurological:  Negative for weakness and numbness.  All other systems reviewed and are negative.  Physical Exam Updated Vital Signs BP 116/88  Pulse 83   Temp 98.9 F (37.2 C) (Oral)   Resp 18   SpO2 95%   Physical Exam Constitutional:      General: He is not in acute distress.    Appearance: He is well-developed.  Eyes:     Conjunctiva/sclera: Conjunctivae normal.  Cardiovascular:     Rate and Rhythm: Normal rate and regular rhythm.     Pulses:          Dorsalis pedis pulses are 2+ on the right side and 2+ on the left side.  Pulmonary:     Effort: Pulmonary effort is normal.     Breath sounds: Normal breath sounds.  Abdominal:     General: Bowel sounds are normal.     Palpations: Abdomen is soft.     Tenderness: There is no abdominal tenderness. There is right CVA tenderness. There is no left CVA tenderness, guarding or rebound.  Musculoskeletal:     Right lower leg: No edema.     Left lower leg: No edema.     Comments: TTP to the right lumbar paraspinous muscles   Skin:    General: Skin is warm and dry.  Neurological:     Mental Status: He is alert and oriented to person, place, and time.     Comments: Strength is globally decreased but symmetric to the BLE. Normal sensation throughout.     ED Results / Procedures / Treatments   Labs (all labs ordered are listed, but only abnormal results are  displayed) Labs Reviewed - No data to display  EKG None  Radiology CT ABDOMEN PELVIS W CONTRAST  Result Date: 03/09/2021 CLINICAL DATA:  Pyelonephritis, complicated. EXAM: CT ABDOMEN AND PELVIS WITH CONTRAST TECHNIQUE: Multidetector CT imaging of the abdomen and pelvis was performed using the standard protocol following bolus administration of intravenous contrast. CONTRAST:  9mL OMNIPAQUE IOHEXOL 350 MG/ML SOLN COMPARISON:  08/06/2017 abdominal ultrasound. Abdominopelvic CT 05/14/2012 FINDINGS: Lower chest: Left base scarring. Mild cardiomegaly. Lad and left circumflex coronary artery calcification. Pacer. Hepatobiliary: Moderate hepatic steatosis. Normal gallbladder, without biliary ductal dilatation. Pancreas: Pancreatic parenchymal calcifications within the uncinate process. No duct dilatation or acute inflammation. Spleen: Normal in size, without focal abnormality. Adrenals/Urinary Tract: Normal right adrenal gland. Mild left adrenal thickening. 3.0 cm interpolar right renal cyst. No hydronephrosis. Normal left kidney. Normal urinary bladder. Stomach/Bowel: Apparent gastric antral soft tissue fullness is favored to be due to underdistention, as it is less impressive on renal delays. Colonic stool burden suggests constipation. Normal terminal ileum. Appendix is normal including on coronal image 80. Normal small bowel. Vascular/Lymphatic: Aortic atherosclerosis. No abdominopelvic adenopathy. Reproductive: Normal prostate. Other: No significant free fluid. No free intraperitoneal air. Ventral abdominal wall laxity contains fat. Musculoskeletal: Mild right-sided gynecomastia. Degenerative partial fusion of bilateral sacroiliac joints. IMPRESSION: 1. No acute process in the abdomen or pelvis. 2. Possible constipation. 3. Hepatic steatosis. 4. Chronic calcific pancreatitis. 5. Coronary artery atherosclerosis. 6. Aortic Atherosclerosis (ICD10-I70.0). Electronically Signed   By: Jeronimo Greaves M.D.   On:  03/09/2021 21:16    Procedures Procedures   Medications Ordered in ED Medications  lidocaine (LIDODERM) 5 % 1 patch (1 patch Transdermal Patient Refused/Not Given 03/10/21 2122)  HYDROmorphone (DILAUDID) injection 0.5 mg (0.5 mg Intravenous Given 03/10/21 2122)  ondansetron (ZOFRAN) injection 4 mg (4 mg Intravenous Given 03/10/21 2123)  diazepam (VALIUM) tablet 5 mg (5 mg Oral Given 03/10/21 2150)    ED Course  I have reviewed the triage vital signs and the nursing  notes.  Pertinent labs & imaging results that were available during my care of the patient were reviewed by me and considered in my medical decision making (see chart for details).    MDM Rules/Calculators/A&P                          65 y/o male presents to the ED today for eval of back pain. Had full w/u yesterday that was unremarkable for any bony or intraabdominal pathology  Reviewed w/u from visit yesterday. Labs are reassuring. UA was negative for infection.  CT abd/pelvis -  1. No acute process in the abdomen or pelvis. 2. Possible constipation. 3. Hepatic steatosis. 4. Chronic calcific pancreatitis. 5. Coronary artery atherosclerosis. 6. Aortic Atherosclerosis  Pt has no red flag signs or symptoms. His neuro exam is wnl so I have low suspicion for cord injury or other emergent neurosurgical process. His distal pulses are symmetric and his aorta appeared normal on yesterdays scan so I have low suspicion for dissection, especially given the chronic nature of his sxs. Feel he is likely safe for dispo pending his ability to ambulate after pain control.   At shift change, care transitioned to Dr. Freida Busman with plan to reassess pt and ensure is able to ambulate. If he is able to ambulate I feel he is appropriate for discharge   Final Clinical Impression(s) / ED Diagnoses Final diagnoses:  Muscle spasm of back    Rx / DC Orders ED Discharge Orders     None        Rayne Du 03/10/21 2209    Lorre Nick, MD 03/10/21 2257

## 2021-03-10 NOTE — ED Provider Notes (Signed)
Patient care assumed at 2300. Patient here for evaluation of back pain. Reassessment is pending after medication administration.  Patient feeling improved after medications. Plan to discharge home with outpatient follow-up and return precautions.   Tilden Fossa, MD 03/10/21 2324

## 2021-03-10 NOTE — ED Notes (Signed)
Pt states his back pain is chronic and he is currently being seen at [facility] for pain management. Pt states he will call to inform them of his visit and follow-up with that office once discharged. Dr. Pecola Leisure made aware.

## 2021-03-10 NOTE — ED Notes (Signed)
Patient refuses ambulation at this time. When asked if he would ambulate, he told the RN he wanted to wait until after he got results from Dilaudid and robaxin. Dan Humphreys is in the room with him.

## 2021-03-11 NOTE — ED Notes (Signed)
Pt discharged. Instructions and prescriptions given. AAOX4. Pt in no apparent distress with mild pain. The opportunity to ask questions was provided.  

## 2021-03-11 NOTE — ED Notes (Signed)
Pt escorted out to his family's vehicle. Pt stood up out of the wheelchair and sat in the car with moderate help. Pt tolerated well.

## 2021-04-03 ENCOUNTER — Other Ambulatory Visit: Payer: Self-pay | Admitting: Student

## 2021-04-03 DIAGNOSIS — F1010 Alcohol abuse, uncomplicated: Secondary | ICD-10-CM

## 2021-04-04 ENCOUNTER — Other Ambulatory Visit: Payer: Self-pay | Admitting: Student

## 2021-04-04 DIAGNOSIS — F1010 Alcohol abuse, uncomplicated: Secondary | ICD-10-CM

## 2021-04-20 ENCOUNTER — Other Ambulatory Visit: Payer: Self-pay | Admitting: Student

## 2021-04-20 DIAGNOSIS — F1010 Alcohol abuse, uncomplicated: Secondary | ICD-10-CM

## 2022-04-08 ENCOUNTER — Other Ambulatory Visit: Payer: Self-pay

## 2022-04-08 ENCOUNTER — Emergency Department (HOSPITAL_COMMUNITY)
Admission: EM | Admit: 2022-04-08 | Discharge: 2022-04-08 | Discharge status: Against Medical Advice | Payer: Medicare Other | Attending: Physician Assistant | Admitting: Physician Assistant

## 2022-04-08 DIAGNOSIS — Z5321 Procedure and treatment not carried out due to patient leaving prior to being seen by health care provider: Secondary | ICD-10-CM | POA: Insufficient documentation

## 2022-04-08 DIAGNOSIS — R3589 Other polyuria: Secondary | ICD-10-CM | POA: Diagnosis not present

## 2022-04-08 DIAGNOSIS — F0390 Unspecified dementia without behavioral disturbance: Secondary | ICD-10-CM | POA: Insufficient documentation

## 2022-04-08 DIAGNOSIS — R32 Unspecified urinary incontinence: Secondary | ICD-10-CM | POA: Diagnosis not present

## 2022-04-08 LAB — COMPREHENSIVE METABOLIC PANEL
ALT: 59 U/L — ABNORMAL HIGH (ref 0–44)
AST: 83 U/L — ABNORMAL HIGH (ref 15–41)
Albumin: 3 g/dL — ABNORMAL LOW (ref 3.5–5.0)
Alkaline Phosphatase: 110 U/L (ref 38–126)
Anion gap: 13 (ref 5–15)
BUN: 15 mg/dL (ref 8–23)
CO2: 29 mmol/L (ref 22–32)
Calcium: 9.4 mg/dL (ref 8.9–10.3)
Chloride: 98 mmol/L (ref 98–111)
Creatinine, Ser: 1.39 mg/dL — ABNORMAL HIGH (ref 0.61–1.24)
GFR, Estimated: 55 mL/min — ABNORMAL LOW (ref >60)
Glucose, Bld: 103 mg/dL — ABNORMAL HIGH (ref 70–99)
Potassium: 2.6 mmol/L — CL (ref 3.5–5.1)
Sodium: 140 mmol/L (ref 135–145)
Total Bilirubin: 0.9 mg/dL (ref 0.3–1.2)
Total Protein: 7.2 g/dL (ref 6.5–8.1)

## 2022-04-08 LAB — URINALYSIS, MICROSCOPIC (REFLEX)

## 2022-04-08 LAB — URINALYSIS, ROUTINE W REFLEX MICROSCOPIC
Glucose, UA: NEGATIVE mg/dL
Ketones, ur: NEGATIVE mg/dL
Nitrite: NEGATIVE
Protein, ur: >300 mg/dL — AB
Specific Gravity, Urine: 1.02 (ref 1.005–1.030)
pH: 6.5 (ref 5.0–8.0)

## 2022-04-08 LAB — CBC WITH DIFFERENTIAL/PLATELET
Abs Immature Granulocytes: 0.05 10*3/uL (ref 0.00–0.07)
Basophils Absolute: 0 10*3/uL (ref 0.0–0.1)
Basophils Relative: 0 %
Eosinophils Absolute: 0.1 10*3/uL (ref 0.0–0.5)
Eosinophils Relative: 1 %
HCT: 38.1 % — ABNORMAL LOW (ref 39.0–52.0)
Hemoglobin: 13.3 g/dL (ref 13.0–17.0)
Immature Granulocytes: 1 %
Lymphocytes Relative: 13 %
Lymphs Abs: 1.3 10*3/uL (ref 0.7–4.0)
MCH: 32.1 pg (ref 26.0–34.0)
MCHC: 34.9 g/dL (ref 30.0–36.0)
MCV: 92 fL (ref 80.0–100.0)
Monocytes Absolute: 0.7 10*3/uL (ref 0.1–1.0)
Monocytes Relative: 7 %
Neutro Abs: 7.7 10*3/uL (ref 1.7–7.7)
Neutrophils Relative %: 78 %
Platelets: 209 10*3/uL (ref 150–400)
RBC: 4.14 MIL/uL — ABNORMAL LOW (ref 4.22–5.81)
RDW: 18 % — ABNORMAL HIGH (ref 11.5–15.5)
WBC: 9.9 10*3/uL (ref 4.0–10.5)
nRBC: 0 % (ref 0.0–0.2)

## 2022-04-08 NOTE — ED Provider Triage Note (Signed)
Emergency Medicine Provider Triage Evaluation Note  [name] Phillips , a 66 y.o. male  was evaluated in triage.  Pt complains of urinary incontinence. Dementia at baseline. No fever, abd pain. Sx for 1 week. Cannot hold urine when he feels to urge.   Review of Systems  Positive: Polyuria, incontinence Negative: fever  Physical Exam  There were no vitals taken for this visit. Gen:   Awake, no distress   Resp:  Normal effort  MSK:   Moves extremities without difficulty  Other:    Medical Decision Making  Medically screening exam initiated at 11:04 AM.  Appropriate orders placed.  [name] Phillips was informed that the remainder of the evaluation will be completed by another provider, this initial triage assessment does not replace that evaluation, and the importance of remaining in the ED until their evaluation is complete.  Urinary incontinence    Sumie Remsen A, PA-C 04/08/22 1104

## 2022-04-08 NOTE — ED Notes (Signed)
Potassium 2.6 critical called from lab. Frazer PA notified

## 2022-04-08 NOTE — ED Triage Notes (Signed)
EMS stated, He has urinary incontinence for about a week. He does have some dementia.

## 2022-04-08 NOTE — ED Notes (Signed)
Pt left. 

## 2022-04-10 LAB — URINE CULTURE: Culture: >=100000 — AB

## 2022-04-11 ENCOUNTER — Telehealth (HOSPITAL_BASED_OUTPATIENT_CLINIC_OR_DEPARTMENT_OTHER): Payer: Self-pay | Admitting: *Deleted

## 2022-04-11 NOTE — Telephone Encounter (Signed)
Post ED Visit - Positive Culture Follow-up: Unsuccessful Patient Follow-up  Culture assessed and recommendations reviewed by:  []  , Pharm.D. []  Enzo Bi, Pharm.D., BCPS AQ-ID []  , Pharm.D., BCPS []  Celedonio Miyamoto, Pharm.D., BCPS []  Mellen, Garvin Fila.D., BCPS, AAHIVP []  , Pharm.D., BCPS, AAHIVP []  Georgina Pillion, PharmD []  , PharmD, BCPS  Positive urine culture  [x]  Patient discharged without antimicrobial prescription and treatment is now indicated []  Organism is resistant to prescribed ED discharge antimicrobial []  Patient with positive blood cultures  Pt with +UC, left without being seen.  Needs Keflex 500mg  PO BID x 5 days or F/U with PCP  Unable to contact patient after 3 attempts, letter will be sent to address on file  [place] 04/11/2022, 9:11 AM

## 2022-04-11 NOTE — Progress Notes (Signed)
ED Antimicrobial Stewardship Positive Culture Follow Up   [name] Phillips is an 66 y.o. male who presented to [facility] on 04/08/2022 with a chief complaint of  Chief Complaint  Patient presents with   Urinary Incontinence    Recent Results (from the past 720 hour(s))  Urine Culture     Status: Abnormal   Collection Time: 04/08/22 11:06 AM   Specimen: Urine, Clean Catch  Result Value Ref Range Status   Specimen Description URINE, CLEAN CATCH  Final   Special Requests   Final    NONE Performed at [facility], [address], [place], [place] [address]    Culture >=100,000 COLONIES/mL ESCHERICHIA COLI (A)  Final   Report Status 04/10/2022 FINAL  Final   Organism ID, Bacteria ESCHERICHIA COLI (A)  Final      Susceptibility   Escherichia coli - MIC*    AMPICILLIN <=2 SENSITIVE Sensitive     CEFAZOLIN <=4 SENSITIVE Sensitive     CEFEPIME <=0.12 SENSITIVE Sensitive     CEFTRIAXONE <=0.25 SENSITIVE Sensitive     CIPROFLOXACIN <=0.25 SENSITIVE Sensitive     GENTAMICIN <=1 SENSITIVE Sensitive     IMIPENEM <=0.25 SENSITIVE Sensitive     NITROFURANTOIN <=16 SENSITIVE Sensitive     TRIMETH/SULFA <=20 SENSITIVE Sensitive     AMPICILLIN/SULBACTAM <=2 SENSITIVE Sensitive     PIP/TAZO <=4 SENSITIVE Sensitive     * >=100,000 COLONIES/mL ESCHERICHIA COLI    [x]  Patient discharged originally without antimicrobial agent and treatment is now indicated  New antibiotic prescription: Patient left before being seen by a physician.  Urine culture positive for Ecoli.  Penicillin allergy reported and has received ceftriaxone in the past without issues.  Will prescribe cephalexin 500mg  po BID x 5 days.  Recommend follow up with primary care  ED Provider: , PA,C   04/11/2022, 8:58 AM Clinical Pharmacist Monday - Friday phone -  [phone] Saturday - Sunday phone - [phone]

## 2022-06-05 ENCOUNTER — Encounter: Payer: Medicare Other | Admitting: Vascular Surgery

## 2022-06-26 ENCOUNTER — Encounter: Payer: 59 | Admitting: Vascular Surgery

## 2022-07-28 ENCOUNTER — Emergency Department (HOSPITAL_COMMUNITY)
Admission: EM | Admit: 2022-07-28 | Discharge: 2022-07-29 | Disposition: A | Discharge status: Home / Self Care | Payer: 59 | Attending: Emergency Medicine | Admitting: Emergency Medicine

## 2022-07-28 ENCOUNTER — Emergency Department (HOSPITAL_COMMUNITY): Payer: 59

## 2022-07-28 ENCOUNTER — Other Ambulatory Visit: Payer: Self-pay

## 2022-07-28 DIAGNOSIS — F10120 Alcohol abuse with intoxication, uncomplicated: Secondary | ICD-10-CM | POA: Diagnosis not present

## 2022-07-28 DIAGNOSIS — Z8673 Personal history of transient ischemic attack (TIA), and cerebral infarction without residual deficits: Secondary | ICD-10-CM | POA: Diagnosis not present

## 2022-07-28 DIAGNOSIS — Z7901 Long term (current) use of anticoagulants: Secondary | ICD-10-CM | POA: Diagnosis not present

## 2022-07-28 DIAGNOSIS — F10920 Alcohol use, unspecified with intoxication, uncomplicated: Secondary | ICD-10-CM

## 2022-07-28 DIAGNOSIS — E876 Hypokalemia: Secondary | ICD-10-CM | POA: Diagnosis not present

## 2022-07-28 DIAGNOSIS — I509 Heart failure, unspecified: Secondary | ICD-10-CM | POA: Insufficient documentation

## 2022-07-28 DIAGNOSIS — I110 Hypertensive heart disease with heart failure: Secondary | ICD-10-CM | POA: Diagnosis not present

## 2022-07-28 DIAGNOSIS — R4182 Altered mental status, unspecified: Secondary | ICD-10-CM | POA: Diagnosis present

## 2022-07-28 DIAGNOSIS — Z79899 Other long term (current) drug therapy: Secondary | ICD-10-CM | POA: Diagnosis not present

## 2022-07-28 DIAGNOSIS — Y908 Blood alcohol level of 240 mg/100 ml or more: Secondary | ICD-10-CM | POA: Diagnosis not present

## 2022-07-28 LAB — COMPREHENSIVE METABOLIC PANEL
ALT: 32 U/L (ref 0–44)
AST: 48 U/L — ABNORMAL HIGH (ref 15–41)
Albumin: 3.1 g/dL — ABNORMAL LOW (ref 3.5–5.0)
Alkaline Phosphatase: 126 U/L (ref 38–126)
Anion gap: 11 (ref 5–15)
BUN: 24 mg/dL — ABNORMAL HIGH (ref 8–23)
CO2: 28 mmol/L (ref 22–32)
Calcium: 9.2 mg/dL (ref 8.9–10.3)
Chloride: 104 mmol/L (ref 98–111)
Creatinine, Ser: 1.38 mg/dL — ABNORMAL HIGH (ref 0.61–1.24)
GFR, Estimated: 55 mL/min — ABNORMAL LOW (ref >60)
Glucose, Bld: 131 mg/dL — ABNORMAL HIGH (ref 70–99)
Potassium: 2.6 mmol/L — CL (ref 3.5–5.1)
Sodium: 143 mmol/L (ref 135–145)
Total Bilirubin: 0.3 mg/dL (ref 0.3–1.2)
Total Protein: 7 g/dL (ref 6.5–8.1)

## 2022-07-28 LAB — CBC WITH DIFFERENTIAL/PLATELET
Abs Immature Granulocytes: 0.02 10*3/uL (ref 0.00–0.07)
Basophils Absolute: 0 10*3/uL (ref 0.0–0.1)
Basophils Relative: 1 %
Eosinophils Absolute: 0.1 10*3/uL (ref 0.0–0.5)
Eosinophils Relative: 2 %
HCT: 35.8 % — ABNORMAL LOW (ref 39.0–52.0)
Hemoglobin: 11.7 g/dL — ABNORMAL LOW (ref 13.0–17.0)
Immature Granulocytes: 0 %
Lymphocytes Relative: 39 %
Lymphs Abs: 2.4 10*3/uL (ref 0.7–4.0)
MCH: 31.5 pg (ref 26.0–34.0)
MCHC: 32.7 g/dL (ref 30.0–36.0)
MCV: 96.2 fL (ref 80.0–100.0)
Monocytes Absolute: 0.7 10*3/uL (ref 0.1–1.0)
Monocytes Relative: 11 %
Neutro Abs: 2.8 10*3/uL (ref 1.7–7.7)
Neutrophils Relative %: 47 %
Platelets: 174 10*3/uL (ref 150–400)
RBC: 3.72 MIL/uL — ABNORMAL LOW (ref 4.22–5.81)
RDW: 16.6 % — ABNORMAL HIGH (ref 11.5–15.5)
WBC: 6.1 10*3/uL (ref 4.0–10.5)
nRBC: 0 % (ref 0.0–0.2)

## 2022-07-28 LAB — AMMONIA: Ammonia: 44 umol/L — ABNORMAL HIGH (ref 9–35)

## 2022-07-28 LAB — MAGNESIUM: Magnesium: 1.6 mg/dL — ABNORMAL LOW (ref 1.7–2.4)

## 2022-07-28 LAB — ETHANOL: Alcohol, Ethyl (B): 345 mg/dL (ref <10)

## 2022-07-28 LAB — TROPONIN I (HIGH SENSITIVITY): Troponin I (High Sensitivity): 31 ng/L — ABNORMAL HIGH (ref <18)

## 2022-07-28 MED ORDER — POTASSIUM CHLORIDE 10 MEQ/100ML IV SOLN
10.0000 meq | INTRAVENOUS | Status: AC
  Administered 2022-07-29 (×6): 10 meq via INTRAVENOUS
  Filled 2022-07-28 (×6): qty 100

## 2022-07-28 MED ORDER — MAGNESIUM SULFATE 2 GM/50ML IV SOLN
2.0000 g | Freq: Once | INTRAVENOUS | Status: AC
  Administered 2022-07-29: 2 g via INTRAVENOUS
  Filled 2022-07-28: qty 50

## 2022-07-28 MED ORDER — LACTATED RINGERS IV BOLUS
1000.0000 mL | Freq: Once | INTRAVENOUS | Status: AC
  Administered 2022-07-28: 1000 mL via INTRAVENOUS

## 2022-07-28 MED ORDER — LACTULOSE 10 GM/15ML PO SOLN
30.0000 g | Freq: Once | ORAL | Status: AC
  Administered 2022-07-29: 30 g via ORAL
  Filled 2022-07-28: qty 60

## 2022-07-28 MED ORDER — POTASSIUM CHLORIDE CRYS ER 20 MEQ PO TBCR
40.0000 meq | EXTENDED_RELEASE_TABLET | Freq: Once | ORAL | Status: AC
  Administered 2022-07-29: 40 meq via ORAL
  Filled 2022-07-28: qty 2

## 2022-07-28 MED ORDER — THIAMINE HCL 100 MG/ML IJ SOLN
100.0000 mg | Freq: Once | INTRAMUSCULAR | Status: AC
  Administered 2022-07-28: 100 mg via INTRAVENOUS
  Filled 2022-07-28: qty 2

## 2022-07-28 NOTE — ED Notes (Signed)
Patient transported to CT 

## 2022-07-28 NOTE — ED Triage Notes (Signed)
BIBA from home for AMS. Pt has reportedly been drinking liquor today, also c/o burning and increased urination. Pt family also reports that they think he may have dementia, but has not been diagnosed.

## 2022-07-28 NOTE — ED Provider Notes (Signed)
[facility] Provider Note   CSN: [id number] Arrival date & time: 07/28/22  2134     History {Add pertinent medical, surgical, social history, OB history to HPI:1} No chief complaint on file.   [name] Phillips is a 67 y.o. male.  HPI Patient presents for altered mental status.  Medical history includes alcohol abuse, HTN, CHF, CVA, nonischemic cardiomyopathy, UGIB, thrombocytopenia, seizures.  He arrives from home via EMS.  Patient's wife, who he lives with, reports that he was in his normal state of health earlier today.  He drinks "when he can get it".  She states that this is not every day but he was drinking throughout the day today.  This evening, he was yelling at his wife and not making sense.  For this reason, EMS was called.  Patient initially refused transport.  Wife reported that he has had some foul-smelling urine lately.  Patient and wife deny any recent dysuria.  Patient ultimately agreed to transport for evaluation.  On arrival, patient denies any current symptoms.  He does have a documented history of seizures and is not on any AEDs.  Wife reports that last seizure episode was 10 years ago.  Patient and wife deny any recent falls.    Home Medications Prior to Admission medications   Medication Sig Start Date End Date Taking? Authorizing Provider  acetaminophen (TYLENOL) 325 MG tablet Take 2 tablets (650 mg total) by mouth every 6 (six) hours as needed. Do not take more than '4000mg'$  of tylenol per day Patient not taking: Reported on 11/25/2017 07/27/17   Couture, Cortni S, PA-C  atorvastatin (LIPITOR) 20 MG tablet Take 1 tablet (20 mg total) by mouth daily. 06/10/19   Jean Rosenthal, MD  carvedilol (COREG) 25 MG tablet TAKE 1 TABLET TWICE DAILY WITH MEALS 01/12/18   Jean Rosenthal, MD  clopidogrel (PLAVIX) 75 MG tablet Take 1 tablet (75 mg total) by mouth daily. At breakfast 08/14/18   Jean Rosenthal, MD  folic acid (FOLVITE) 1 MG tablet Take  1 tablet (1 mg total) by mouth daily. Patient not taking: Reported on 08/04/2017 04/25/17   Collier Salina, MD  furosemide (LASIX) 20 MG tablet Take 1 tablet (20 mg total) by mouth daily. 11/10/20   Sherwood Gambler, MD  lidocaine (LIDODERM) 5 % Place 1 patch onto the skin daily. Remove & Discard patch within 12 hours or as directed by MD 03/09/21   Sherwood Gambler, MD  Multiple Vitamin (MULTIVITAMIN WITH MINERALS) TABS tablet Take 1 tablet by mouth daily.    [provider]  naltrexone (DEPADE) 50 MG tablet TAKE 2 TABLETS BY MOUTH ONCE DAILY *EMERGENCY REFILL* 04/25/20   Katsadouros, Candace Gallus, MD  pantoprazole (PROTONIX) 40 MG tablet TAKE 1 TABLET TWICE DAILY 04/15/18   Agyei, Caprice Kluver, MD  ramipril (ALTACE) 10 MG capsule TAKE 1 CAPSULE TWICE DAILY 01/12/18   Jean Rosenthal, MD  spironolactone (ALDACTONE) 25 MG tablet Take 1 tablet (25 mg total) by mouth daily. 06/10/19   Jean Rosenthal, MD  tadalafil (CIALIS) 10 MG tablet Take 1 tablet (10 mg total) by mouth as needed for erectile dysfunction. 04/17/18 04/17/19  Asencion Noble, MD  thiamine (VITAMIN B-1) 100 MG tablet Take 1 tablet (100 mg total) by mouth daily. Patient not taking: Reported on 08/04/2017 06/06/17   Collier Salina, MD      Allergies    Codeine, Penicillins, and Prednisone    Review of  Systems   Review of Systems  Psychiatric/Behavioral:  Positive for confusion.   All other systems reviewed and are negative.   Physical Exam Updated Vital Signs There were no vitals taken for this visit. Physical Exam Vitals and nursing note reviewed.  Constitutional:      General: He is not in acute distress.    Appearance: Normal appearance. He is well-developed. He is not ill-appearing, toxic-appearing or diaphoretic.  HENT:     Head: Normocephalic and atraumatic.     Right Ear: External ear normal.     Left Ear: External ear normal.     Nose: Nose normal.     Mouth/Throat:     Mouth: Mucous membranes are moist.   Eyes:     Extraocular Movements: Extraocular movements intact.     Conjunctiva/sclera: Conjunctivae normal.  Cardiovascular:     Rate and Rhythm: Normal rate and regular rhythm.     Heart sounds: No murmur heard. Pulmonary:     Effort: Pulmonary effort is normal. No respiratory distress.     Breath sounds: Normal breath sounds. No wheezing or rales.  Chest:     Chest wall: No tenderness.  Abdominal:     Palpations: Abdomen is soft.     Tenderness: There is no abdominal tenderness.  Musculoskeletal:        General: No swelling. Normal range of motion.     Cervical back: Normal range of motion and neck supple.     Right lower leg: No edema.  Skin:    General: Skin is warm and dry.     Coloration: Skin is not jaundiced or pale.  Neurological:     General: No focal deficit present.     Mental Status: He is alert.     Cranial Nerves: No cranial nerve deficit.     Sensory: No sensory deficit.     Motor: No weakness.     Coordination: Coordination normal.  Psychiatric:        Mood and Affect: Mood normal.        Behavior: Behavior normal.     ED Results / Procedures / Treatments   Labs (all labs ordered are listed, but only abnormal results are displayed) Labs Reviewed - No data to display  EKG None  Radiology No results found.  Procedures Procedures  {Document cardiac monitor, telemetry assessment procedure when appropriate:1}  Medications Ordered in ED Medications - No data to display  ED Course/ Medical Decision Making/ A&P   {   Click here for ABCD2, HEART and other calculatorsREFRESH Note before signing :1}                          Medical Decision Making  This patient presents to the ED for concern of ***, this involves an extensive number of treatment options, and is a complaint that carries with it a high risk of complications and morbidity.  The differential diagnosis includes ***   Co morbidities that complicate the patient  evaluation  ***   Additional history obtained:  Additional history obtained from *** External records from outside source obtained and reviewed including ***   Lab Tests:  I Ordered, and personally interpreted labs.  The pertinent results include:  ***   Imaging Studies ordered:  I ordered imaging studies including ***  I independently visualized and interpreted imaging which showed *** I agree with the radiologist interpretation   Cardiac Monitoring: / EKG:  The patient was maintained  on a cardiac monitor.  I personally viewed and interpreted the cardiac monitored which showed an underlying rhythm of: ***   Consultations Obtained:  I requested consultation with the ***,  and discussed lab and imaging findings as well as pertinent plan - they recommend: ***   Problem List / ED Course / Critical interventions / Medication management  Patient arrives for report of confusion at home this evening.  This is in the setting of alcohol use throughout the day today.  On arrival, patient denies any current physical complaints.  He has no focal neurologic deficits on exam.  There is no evidence of trauma.  He does appear intoxicated.  Currently, he is calm and cooperative.  Workup was initiated to identify possible alternative etiologies of his reported confusion besides alcohol intoxication.***. I ordered medication including ***  for ***  Reevaluation of the patient after these medicines showed that the patient {resolved/improved/worsened:23923::"improved"} I have reviewed the patients home medicines and have made adjustments as needed   Social Determinants of Health:  ***   Test / Admission - Considered:  ***   {Document critical care time when appropriate:1} {Document review of labs and clinical decision tools ie heart score, Chads2Vasc2 etc:1}  {Document your independent review of radiology images, and any outside records:1} {Document your discussion with family  members, caretakers, and with consultants:1} {Document social determinants of health affecting pt's care:1} {Document your decision making why or why not admission, treatments were needed:1} Final Clinical Impression(s) / ED Diagnoses Final diagnoses:  None    Rx / DC Orders ED Discharge Orders     None

## 2022-07-29 LAB — URINALYSIS, W/ REFLEX TO CULTURE (INFECTION SUSPECTED)
Bilirubin Urine: NEGATIVE
Glucose, UA: NEGATIVE mg/dL
Ketones, ur: NEGATIVE mg/dL
Nitrite: NEGATIVE
Protein, ur: 30 mg/dL — AB
Specific Gravity, Urine: 1.012 (ref 1.005–1.030)
pH: 6 (ref 5.0–8.0)

## 2022-07-29 LAB — RAPID URINE DRUG SCREEN, HOSP PERFORMED
Amphetamines: NOT DETECTED
Barbiturates: NOT DETECTED
Benzodiazepines: NOT DETECTED
Cocaine: NOT DETECTED
Opiates: NOT DETECTED
Tetrahydrocannabinol: NOT DETECTED

## 2022-07-29 LAB — TROPONIN I (HIGH SENSITIVITY): Troponin I (High Sensitivity): 33 ng/L — ABNORMAL HIGH (ref <18)

## 2022-07-29 LAB — BASIC METABOLIC PANEL
Anion gap: 11 (ref 5–15)
BUN: 21 mg/dL (ref 8–23)
CO2: 28 mmol/L (ref 22–32)
Calcium: 9.1 mg/dL (ref 8.9–10.3)
Chloride: 108 mmol/L (ref 98–111)
Creatinine, Ser: 1.21 mg/dL (ref 0.61–1.24)
GFR, Estimated: >60 mL/min (ref >60)
Glucose, Bld: 87 mg/dL (ref 70–99)
Potassium: 3.3 mmol/L — ABNORMAL LOW (ref 3.5–5.1)
Sodium: 147 mmol/L — ABNORMAL HIGH (ref 135–145)

## 2022-07-29 LAB — CBG MONITORING, ED: Glucose-Capillary: 106 mg/dL — ABNORMAL HIGH (ref 70–99)

## 2022-07-29 NOTE — ED Notes (Signed)
Pt coughed up second potassium pill onto floor.

## 2022-07-29 NOTE — ED Notes (Signed)
Pt went to bathroom x3 to have BM. Ambulated independently each time

## 2022-07-29 NOTE — ED Provider Notes (Signed)
Care assumed at 2330.  Patient here for altered mental status.  He is significantly intoxicated based off of labs.  At time of reevaluation patient is awake, alert and able to ambulate at a steady gait.  No evidence of acute infectious process.  Presentation is not consistent with acute CVA.  Patient is not currently sure if he wants to stop drinking at this point.  He did have hypokalemia and hypomagnesia and these were repleted.  Ammonia was minimally elevated but currently there is no evidence of hepatic encephalopathy.  Feel he is stable for discharge with outpatient resources and return precautions.   Quintella Reichert, MD 07/29/22 [phone]

## 2022-07-29 NOTE — ED Notes (Signed)
Pt ambulated to bathroom independently with slow steady gait. Pt also ate applesauce with no issue.

## 2022-07-29 NOTE — ED Notes (Signed)
Patients brother has been called   he is on the way to pick him up   he said it would be about 30 minutes    patient is otherwise ready for discharge

## 2022-07-29 NOTE — ED Notes (Signed)
Writer spoke with patient's wife on the phone giving update on patient.

## 2022-08-23 ENCOUNTER — Encounter (HOSPITAL_COMMUNITY): Payer: Self-pay | Admitting: Emergency Medicine

## 2022-08-23 ENCOUNTER — Other Ambulatory Visit: Payer: Self-pay

## 2022-08-23 ENCOUNTER — Emergency Department (HOSPITAL_COMMUNITY): Payer: 59

## 2022-08-23 ENCOUNTER — Emergency Department (HOSPITAL_COMMUNITY)
Admission: EM | Admit: 2022-08-23 | Discharge: 2022-08-24 | Discharge status: Against Medical Advice | Payer: 59 | Attending: Emergency Medicine | Admitting: Emergency Medicine

## 2022-08-23 DIAGNOSIS — Z95810 Presence of automatic (implantable) cardiac defibrillator: Secondary | ICD-10-CM | POA: Diagnosis not present

## 2022-08-23 DIAGNOSIS — Z79899 Other long term (current) drug therapy: Secondary | ICD-10-CM | POA: Insufficient documentation

## 2022-08-23 DIAGNOSIS — E876 Hypokalemia: Secondary | ICD-10-CM

## 2022-08-23 DIAGNOSIS — F17290 Nicotine dependence, other tobacco product, uncomplicated: Secondary | ICD-10-CM | POA: Diagnosis not present

## 2022-08-23 DIAGNOSIS — R079 Chest pain, unspecified: Secondary | ICD-10-CM | POA: Diagnosis present

## 2022-08-23 DIAGNOSIS — R0781 Pleurodynia: Secondary | ICD-10-CM

## 2022-08-23 DIAGNOSIS — D649 Anemia, unspecified: Secondary | ICD-10-CM

## 2022-08-23 DIAGNOSIS — I10 Essential (primary) hypertension: Secondary | ICD-10-CM

## 2022-08-23 MED ORDER — NITROGLYCERIN 0.4 MG SL SUBL: 0.4000 mg | SUBLINGUAL_TABLET | SUBLINGUAL | Status: DC | PRN

## 2022-08-23 NOTE — ED Provider Notes (Signed)
[facility] Provider Note   CSN: [id number] Arrival date & time: 08/23/22  2317     History {Add pertinent medical, surgical, social history, OB history to HPI:1} Chief Complaint  Patient presents with   Chest Pain    [name] Phillips is a 67 y.o. male.  The history is provided by the patient.  Chest Pain He has history of hypertension, seizure disorder, stroke, nonischemic cardiomyopathy with AICD implantation and had onset about 1 hour ago of left-sided chest pain.  Pain is dull but worse when he takes a deep breath.  There is no associated dyspnea, nausea, diaphoresis.  He did not do anything for his pain, but EMS did give oral aspirin he says that once before he had a similar pain but never sought medical attention.  He smokes cigars, denies history of diabetes or hyperlipidemia.   Home Medications Prior to Admission medications   Medication Sig Start Date End Date Taking? Authorizing Provider  acetaminophen (TYLENOL) 325 MG tablet Take 2 tablets (650 mg total) by mouth every 6 (six) hours as needed. Do not take more than 4000mg  of tylenol per day Patient not taking: Reported on 11/25/2017 07/27/17   Couture, Cortni S, PA-C  atorvastatin (LIPITOR) 20 MG tablet Take 1 tablet (20 mg total) by mouth daily. 06/10/19   Jean Rosenthal, MD  carvedilol (COREG) 25 MG tablet TAKE 1 TABLET TWICE DAILY WITH MEALS 01/12/18   Jean Rosenthal, MD  clopidogrel (PLAVIX) 75 MG tablet Take 1 tablet (75 mg total) by mouth daily. At breakfast 08/14/18   Jean Rosenthal, MD  folic acid (FOLVITE) 1 MG tablet Take 1 tablet (1 mg total) by mouth daily. Patient not taking: Reported on 08/04/2017 04/25/17   Collier Salina, MD  furosemide (LASIX) 20 MG tablet Take 1 tablet (20 mg total) by mouth daily. 11/10/20   Sherwood Gambler, MD  lidocaine (LIDODERM) 5 % Place 1 patch onto the skin daily. Remove & Discard patch within 12 hours or as directed by MD 03/09/21   Sherwood Gambler, MD  Multiple Vitamin (MULTIVITAMIN WITH MINERALS) TABS tablet Take 1 tablet by mouth daily.    [provider]  naltrexone (DEPADE) 50 MG tablet TAKE 2 TABLETS BY MOUTH ONCE DAILY *EMERGENCY REFILL* 04/25/20   Katsadouros, Candace Gallus, MD  pantoprazole (PROTONIX) 40 MG tablet TAKE 1 TABLET TWICE DAILY 04/15/18   Agyei, Caprice Kluver, MD  ramipril (ALTACE) 10 MG capsule TAKE 1 CAPSULE TWICE DAILY 01/12/18   Jean Rosenthal, MD  spironolactone (ALDACTONE) 25 MG tablet Take 1 tablet (25 mg total) by mouth daily. 06/10/19   Jean Rosenthal, MD  tadalafil (CIALIS) 10 MG tablet Take 1 tablet (10 mg total) by mouth as needed for erectile dysfunction. 04/17/18 04/17/19  Asencion Noble, MD  thiamine (VITAMIN B-1) 100 MG tablet Take 1 tablet (100 mg total) by mouth daily. Patient not taking: Reported on 08/04/2017 06/06/17   Collier Salina, MD      Allergies    Codeine, Penicillins, and Prednisone    Review of Systems   Review of Systems  Cardiovascular:  Positive for chest pain.  All other systems reviewed and are negative.   Physical Exam Updated Vital Signs BP (!) 138/108   Pulse 93   Temp 98.9 F (37.2 C) (Oral)   Resp 13   SpO2 100%  Physical Exam Vitals and nursing note reviewed.   67 year old male, resting comfortably  and in no acute distress. Vital signs are significant for elevated blood pressure. Oxygen saturation is 100%, which is normal. Head is normocephalic and atraumatic. PERRLA, EOMI. Oropharynx is clear. Neck is nontender and supple without adenopathy or JVD. Back is nontender and there is no CVA tenderness. Lungs are clear without rales, wheezes, or rhonchi. Chest is nontender.  AICD is present in the right subclavian area. Heart has regular rate and rhythm without murmur. Abdomen is soft, flat, nontender. Extremities have no cyanosis or edema, full range of motion is present. Skin is warm and dry without rash. Neurologic: Mental status is normal, cranial  nerves are intact, moves all extremities equally.  ED Results / Procedures / Treatments   Labs (all labs ordered are listed, but only abnormal results are displayed) Labs Reviewed - No data to display  EKG EKG Interpretation  Date/Time:  Friday August 23 2022 23:25:35 EDT Ventricular Rate:  96 PR Interval:  201 QRS Duration: 146 QT Interval:  390 QTC Calculation: 493 R Axis:   -18 Text Interpretation: Sinus rhythm Ventricular premature complex Probable left atrial enlargement Right bundle branch block When compared with ECG of 07/28/2022, No significant change was found Confirmed by Delora Fuel ([id number]) on 08/23/2022 11:32:06 PM  Radiology No results found.  Procedures Procedures  Cardiac monitor shows normal sinus rhythm, per my interpretation.  Medications Ordered in ED Medications - No data to display  ED Course/ Medical Decision Making/ A&P   {   Click here for ABCD2, HEART and other calculatorsREFRESH Note before signing :1}                          Medical Decision Making  Chest pain of uncertain cause.  Consider angina/ACS, pulmonary embolism.  Doubt pneumonia, pericarditis, aortic aneurysm.  I have reviewed his past records, and echocardiogram on 01/22/2019 showed ejection fraction of 60-65% with grade 1 diastolic dysfunction.  Cardiac catheterization in 2005 showed normal coronary arteries.  I have reviewed and interpreted his electrocardiogram, and my interpretation is sinus rhythm with PVCs, right bundle branch block, unchanged from prior.  I have ordered sublingual nitroglycerin and I have ordered workup of CBC, basic metabolic panel, troponin x 2, D-dimer as well as chest x-ray.  {Document critical care time when appropriate:1} {Document review of labs and clinical decision tools ie heart score, Chads2Vasc2 etc:1}  {Document your independent review of radiology images, and any outside records:1} {Document your discussion with family members, caretakers, and with  consultants:1} {Document social determinants of health affecting pt's care:1} {Document your decision making why or why not admission, treatments were needed:1} Final Clinical Impression(s) / ED Diagnoses Final diagnoses:  None    Rx / DC Orders ED Discharge Orders     None

## 2022-08-23 NOTE — ED Triage Notes (Signed)
Pt bib [facility] from home. Pt c/o 3/10 CP; increases 7/10. Pt states pain radiates around to L flank area. Pt reports drinking alcohol daily, last Thursday. Pt also states he has been awake for 4+ days.

## 2022-08-24 ENCOUNTER — Emergency Department (HOSPITAL_COMMUNITY): Payer: 59

## 2022-08-24 LAB — TROPONIN I (HIGH SENSITIVITY)
Troponin I (High Sensitivity): 15 ng/L (ref <18)
Troponin I (High Sensitivity): 15 ng/L (ref <18)

## 2022-08-24 LAB — CBC WITH DIFFERENTIAL/PLATELET
Abs Immature Granulocytes: 0.02 10*3/uL (ref 0.00–0.07)
Basophils Absolute: 0 10*3/uL (ref 0.0–0.1)
Basophils Relative: 0 %
Eosinophils Absolute: 0.1 10*3/uL (ref 0.0–0.5)
Eosinophils Relative: 2 %
HCT: 36.4 % — ABNORMAL LOW (ref 39.0–52.0)
Hemoglobin: 12.2 g/dL — ABNORMAL LOW (ref 13.0–17.0)
Immature Granulocytes: 0 %
Lymphocytes Relative: 29 %
Lymphs Abs: 2 10*3/uL (ref 0.7–4.0)
MCH: 31.9 pg (ref 26.0–34.0)
MCHC: 33.5 g/dL (ref 30.0–36.0)
MCV: 95.3 fL (ref 80.0–100.0)
Monocytes Absolute: 0.9 10*3/uL (ref 0.1–1.0)
Monocytes Relative: 12 %
Neutro Abs: 3.9 10*3/uL (ref 1.7–7.7)
Neutrophils Relative %: 57 %
Platelets: 158 10*3/uL (ref 150–400)
RBC: 3.82 MIL/uL — ABNORMAL LOW (ref 4.22–5.81)
RDW: 16.3 % — ABNORMAL HIGH (ref 11.5–15.5)
WBC: 7 10*3/uL (ref 4.0–10.5)
nRBC: 0 % (ref 0.0–0.2)

## 2022-08-24 LAB — D-DIMER, QUANTITATIVE: D-Dimer, Quant: 1.04 ug/mL-FEU — ABNORMAL HIGH (ref 0.00–0.50)

## 2022-08-24 LAB — BASIC METABOLIC PANEL
Anion gap: 14 (ref 5–15)
BUN: 9 mg/dL (ref 8–23)
CO2: 30 mmol/L (ref 22–32)
Calcium: 9 mg/dL (ref 8.9–10.3)
Chloride: 96 mmol/L — ABNORMAL LOW (ref 98–111)
Creatinine, Ser: 1.51 mg/dL — ABNORMAL HIGH (ref 0.61–1.24)
GFR, Estimated: 50 mL/min — ABNORMAL LOW (ref >60)
Glucose, Bld: 97 mg/dL (ref 70–99)
Potassium: 2.8 mmol/L — ABNORMAL LOW (ref 3.5–5.1)
Sodium: 140 mmol/L (ref 135–145)

## 2022-08-24 LAB — MAGNESIUM: Magnesium: 1.1 mg/dL — ABNORMAL LOW (ref 1.7–2.4)

## 2022-08-24 MED ORDER — POTASSIUM CHLORIDE 10 MEQ/100ML IV SOLN
10.0000 meq | INTRAVENOUS | Status: AC
  Administered 2022-08-24 (×2): 10 meq via INTRAVENOUS
  Filled 2022-08-24 (×2): qty 100

## 2022-08-24 MED ORDER — POTASSIUM CHLORIDE CRYS ER 20 MEQ PO TBCR
40.0000 meq | EXTENDED_RELEASE_TABLET | Freq: Once | ORAL | Status: AC
  Administered 2022-08-24: 40 meq via ORAL
  Filled 2022-08-24: qty 2

## 2022-08-24 MED ORDER — IOHEXOL 350 MG/ML SOLN
75.0000 mL | Freq: Once | INTRAVENOUS | Status: AC | PRN
  Administered 2022-08-24: 75 mL via INTRAVENOUS

## 2022-08-24 NOTE — ED Notes (Signed)
Pt transported to CT ?

## 2022-08-24 NOTE — ED Notes (Signed)
EKG captured, Dr. Roxanne Mins made aware of pts rhythm.

## 2022-08-24 NOTE — ED Notes (Signed)
Pt pulled IV out and informed this RN that he was laving the facility. Pt was encouraged to stay and receive the full treatment. Pt continues to put clothes on and attempted to walk out. This RN was able to encourage the pt to sit in [facility] and be rolled to the entrance. Provider made aware .

## 2023-01-07 IMAGING — CR DG CHEST 2V
2 series · 2 of 2 positions shown · non-contrast
Comparison: November 25, 2017

CLINICAL DATA: Lower extremity edema.  History of cardiomyopathy

EXAM:
CHEST - 2 VIEW

[chest pa]
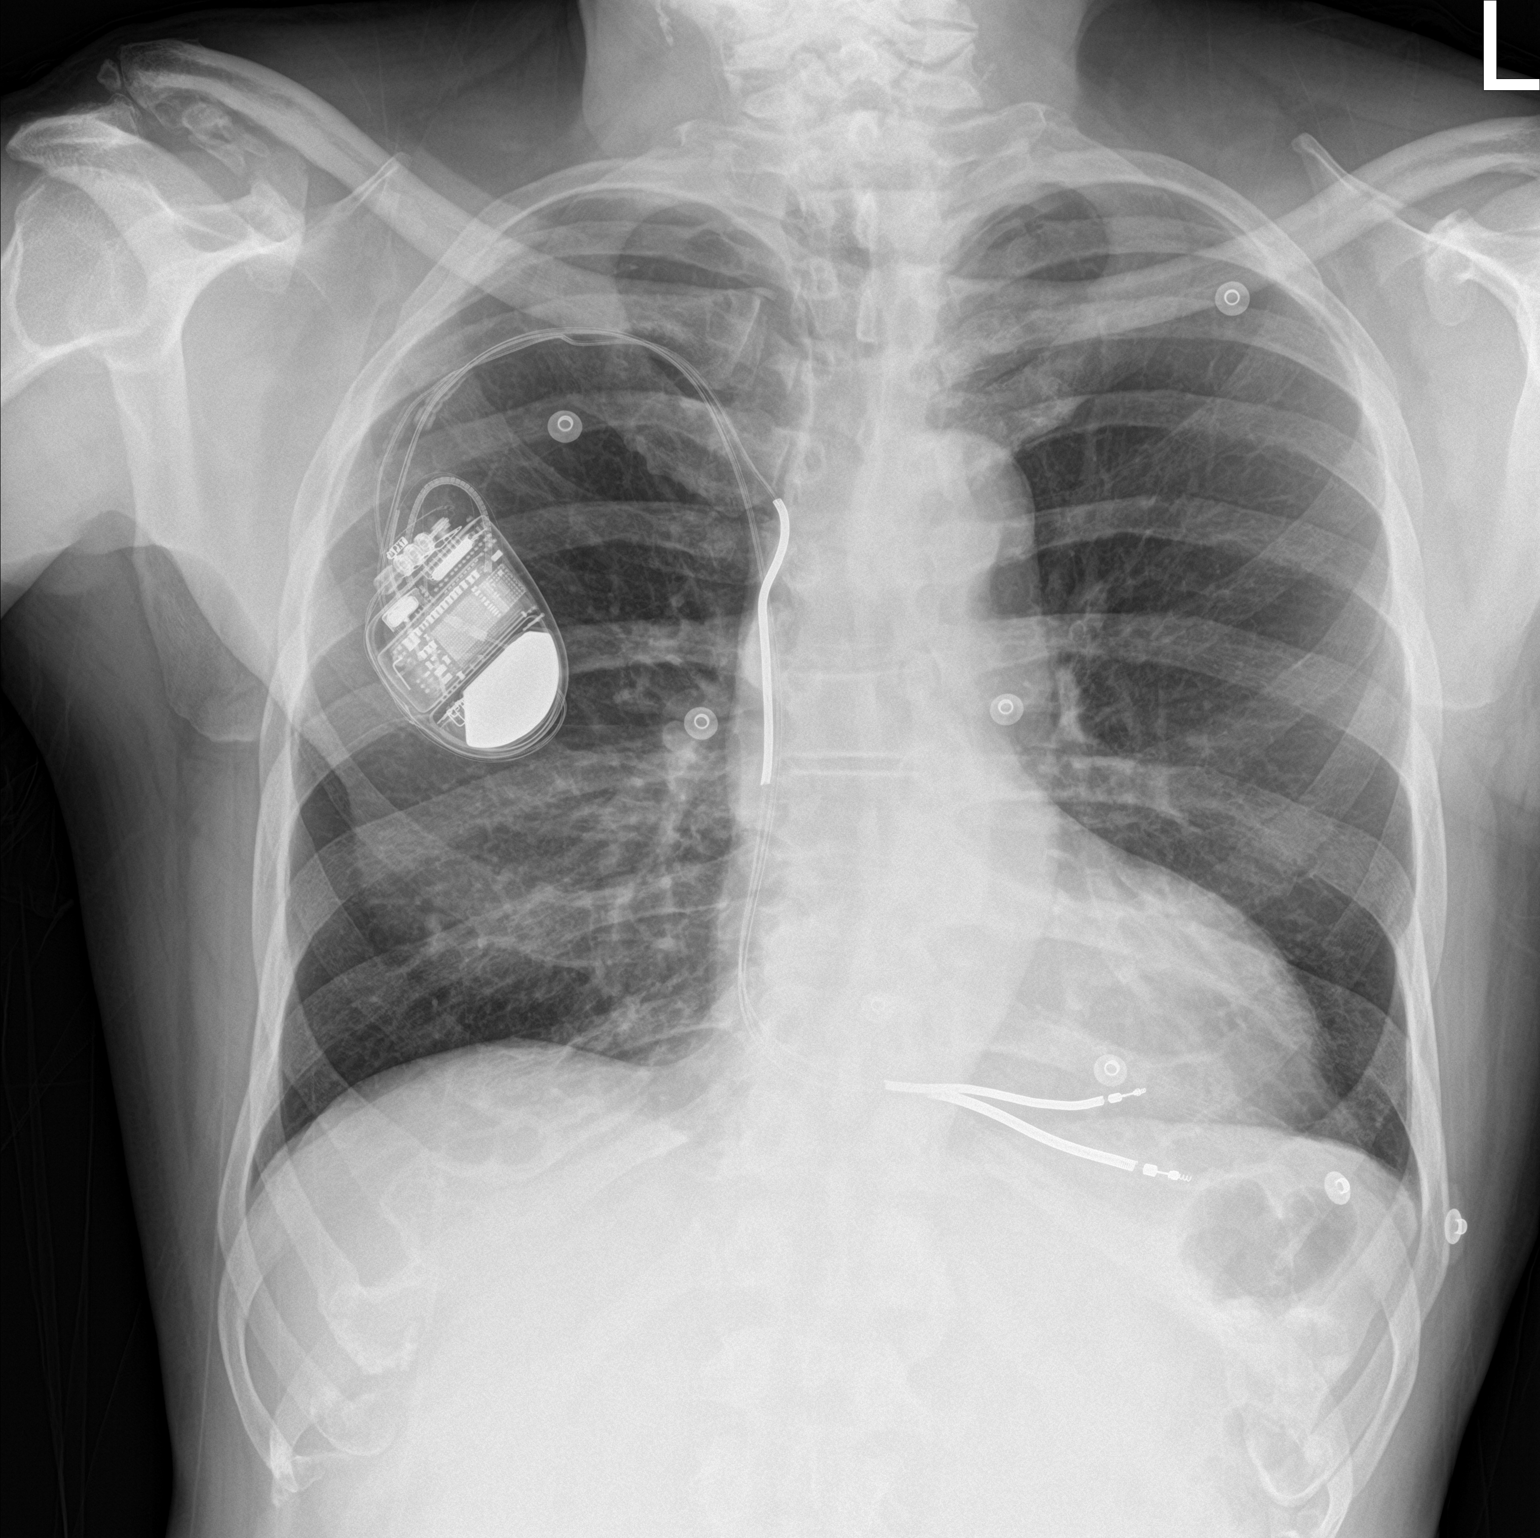

[chest lat]
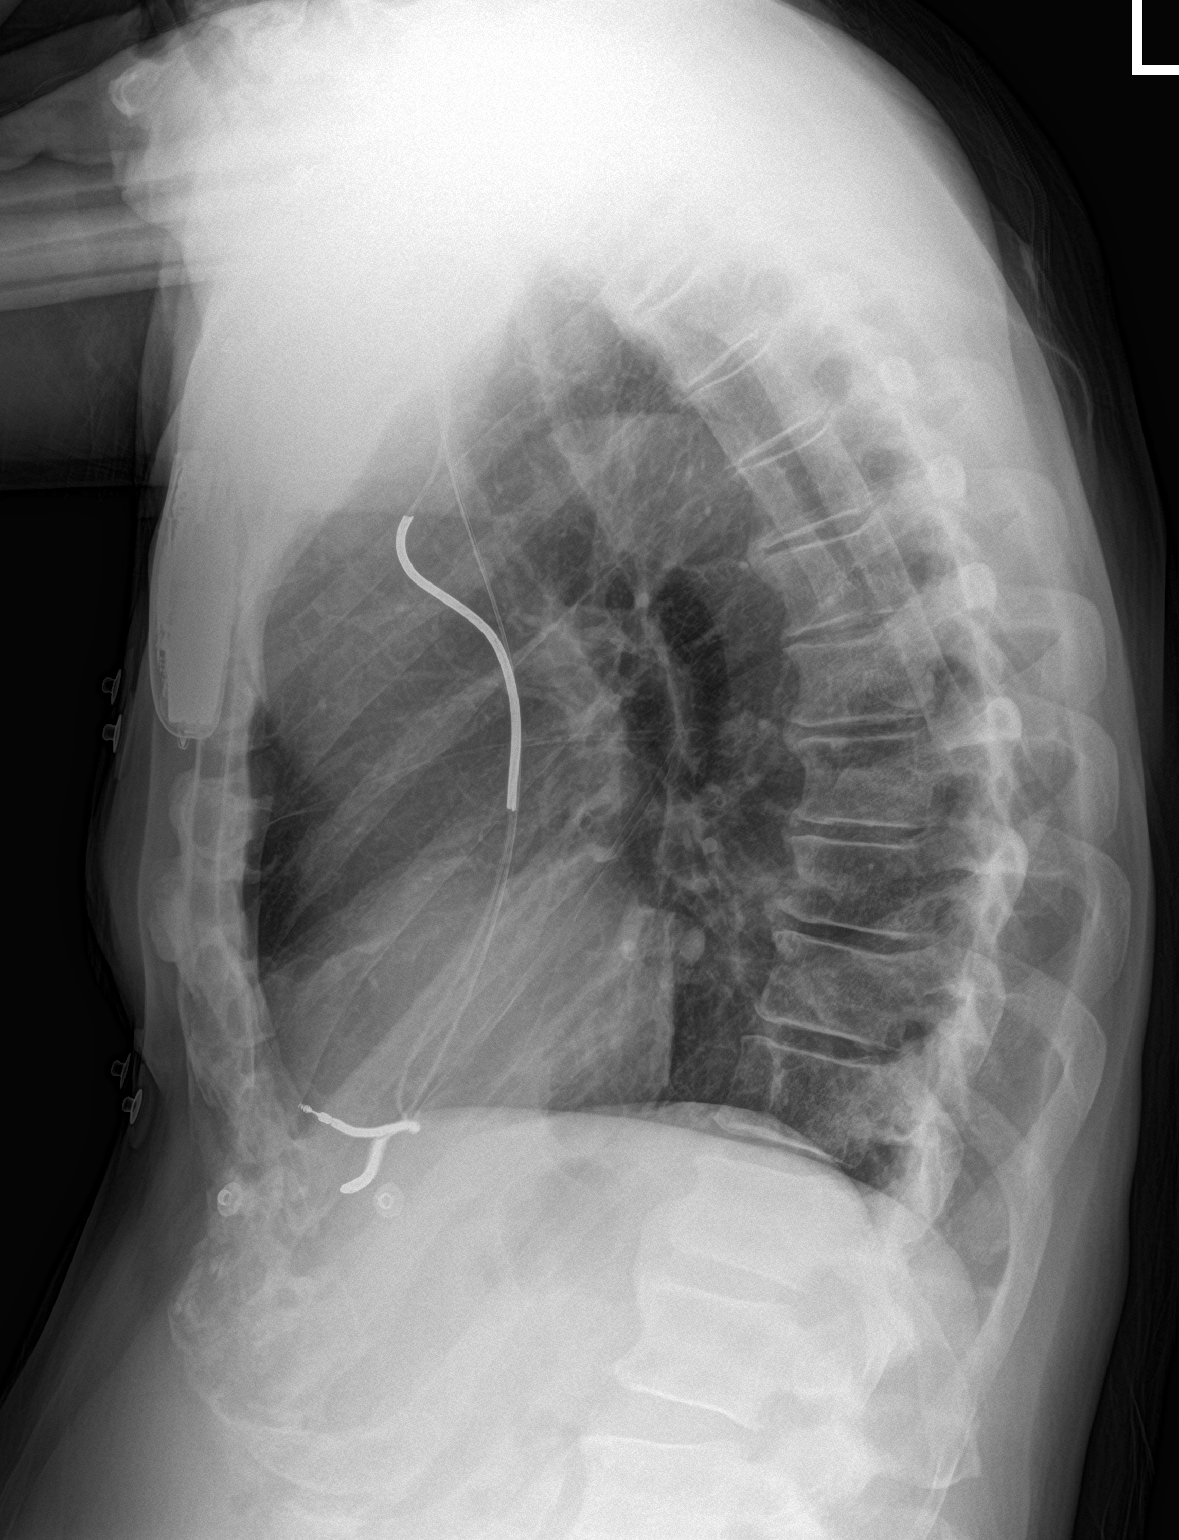

[2 of 2 positions shown; findings below may reference images not displayed]

FINDINGS: No edema or airspace opacity. Heart is upper normal in size with
pulmonary vascularity normal. Pacemaker leads attached to right
ventricle. No adenopathy. No pneumothorax. No bone lesions.
IMPRESSION: No edema or airspace opacity. Heart upper normal in size with
pacemaker leads attached to right ventricle. No adenopathy.

## 2023-01-31 ENCOUNTER — Other Ambulatory Visit: Payer: Self-pay | Admitting: Family Medicine

## 2023-01-31 DIAGNOSIS — R109 Unspecified abdominal pain: Secondary | ICD-10-CM

## 2023-02-04 ENCOUNTER — Ambulatory Visit
Admission: RE | Admit: 2023-02-04 | Discharge: 2023-02-04 | Disposition: A | Discharge status: Home / Self Care | Payer: 59 | Source: Ambulatory Visit | Attending: Family Medicine

## 2023-02-04 DIAGNOSIS — R109 Unspecified abdominal pain: Secondary | ICD-10-CM

## 2023-03-18 ENCOUNTER — Observation Stay (HOSPITAL_COMMUNITY)
Admission: EM | Admit: 2023-03-18 | Discharge: 2023-03-22 | Disposition: A | Discharge status: Skilled Nursing Facility | Payer: 59 | Attending: Emergency Medicine | Admitting: Emergency Medicine

## 2023-03-18 ENCOUNTER — Other Ambulatory Visit: Payer: Self-pay

## 2023-03-18 ENCOUNTER — Encounter (HOSPITAL_COMMUNITY): Payer: Self-pay | Admitting: Emergency Medicine

## 2023-03-18 ENCOUNTER — Emergency Department (HOSPITAL_COMMUNITY): Payer: 59

## 2023-03-18 DIAGNOSIS — X31XXXA Exposure to excessive natural cold, initial encounter: Secondary | ICD-10-CM | POA: Insufficient documentation

## 2023-03-18 DIAGNOSIS — D539 Nutritional anemia, unspecified: Secondary | ICD-10-CM | POA: Diagnosis not present

## 2023-03-18 DIAGNOSIS — E43 Unspecified severe protein-calorie malnutrition: Secondary | ICD-10-CM | POA: Insufficient documentation

## 2023-03-18 DIAGNOSIS — R1312 Dysphagia, oropharyngeal phase: Secondary | ICD-10-CM | POA: Insufficient documentation

## 2023-03-18 DIAGNOSIS — T68XXXA Hypothermia, initial encounter: Secondary | ICD-10-CM | POA: Diagnosis not present

## 2023-03-18 DIAGNOSIS — N1831 Chronic kidney disease, stage 3a: Secondary | ICD-10-CM | POA: Diagnosis not present

## 2023-03-18 DIAGNOSIS — F1020 Alcohol dependence, uncomplicated: Secondary | ICD-10-CM

## 2023-03-18 DIAGNOSIS — E876 Hypokalemia: Secondary | ICD-10-CM | POA: Diagnosis present

## 2023-03-18 DIAGNOSIS — N179 Acute kidney failure, unspecified: Secondary | ICD-10-CM

## 2023-03-18 DIAGNOSIS — Z7982 Long term (current) use of aspirin: Secondary | ICD-10-CM | POA: Diagnosis not present

## 2023-03-18 DIAGNOSIS — Z8673 Personal history of transient ischemic attack (TIA), and cerebral infarction without residual deficits: Secondary | ICD-10-CM | POA: Diagnosis not present

## 2023-03-18 DIAGNOSIS — Z95810 Presence of automatic (implantable) cardiac defibrillator: Secondary | ICD-10-CM | POA: Diagnosis present

## 2023-03-18 DIAGNOSIS — R7401 Elevation of levels of liver transaminase levels: Secondary | ICD-10-CM | POA: Insufficient documentation

## 2023-03-18 DIAGNOSIS — Z79899 Other long term (current) drug therapy: Secondary | ICD-10-CM | POA: Insufficient documentation

## 2023-03-18 DIAGNOSIS — I130 Hypertensive heart and chronic kidney disease with heart failure and stage 1 through stage 4 chronic kidney disease, or unspecified chronic kidney disease: Secondary | ICD-10-CM | POA: Insufficient documentation

## 2023-03-18 DIAGNOSIS — E440 Moderate protein-calorie malnutrition: Secondary | ICD-10-CM | POA: Diagnosis not present

## 2023-03-18 DIAGNOSIS — R531 Weakness: Principal | ICD-10-CM

## 2023-03-18 DIAGNOSIS — Z7902 Long term (current) use of antithrombotics/antiplatelets: Secondary | ICD-10-CM | POA: Diagnosis not present

## 2023-03-18 DIAGNOSIS — I428 Other cardiomyopathies: Secondary | ICD-10-CM

## 2023-03-18 DIAGNOSIS — R2243 Localized swelling, mass and lump, lower limb, bilateral: Secondary | ICD-10-CM | POA: Diagnosis not present

## 2023-03-18 DIAGNOSIS — R7989 Other specified abnormal findings of blood chemistry: Secondary | ICD-10-CM

## 2023-03-18 DIAGNOSIS — F10920 Alcohol use, unspecified with intoxication, uncomplicated: Principal | ICD-10-CM | POA: Insufficient documentation

## 2023-03-18 DIAGNOSIS — I10 Essential (primary) hypertension: Secondary | ICD-10-CM | POA: Diagnosis present

## 2023-03-18 DIAGNOSIS — I509 Heart failure, unspecified: Secondary | ICD-10-CM | POA: Diagnosis not present

## 2023-03-18 DIAGNOSIS — F10929 Alcohol use, unspecified with intoxication, unspecified: Secondary | ICD-10-CM | POA: Diagnosis present

## 2023-03-18 LAB — CBC
HCT: 34.8 % — ABNORMAL LOW (ref 39.0–52.0)
Hemoglobin: 11.4 g/dL — ABNORMAL LOW (ref 13.0–17.0)
MCH: 32.8 pg (ref 26.0–34.0)
MCHC: 32.8 g/dL (ref 30.0–36.0)
MCV: 100 fL (ref 80.0–100.0)
Platelets: 144 10*3/uL — ABNORMAL LOW (ref 150–400)
RBC: 3.48 MIL/uL — ABNORMAL LOW (ref 4.22–5.81)
RDW: 17.7 % — ABNORMAL HIGH (ref 11.5–15.5)
WBC: 5.7 10*3/uL (ref 4.0–10.5)
nRBC: 0 % (ref 0.0–0.2)

## 2023-03-18 LAB — COMPREHENSIVE METABOLIC PANEL
ALT: 69 U/L — ABNORMAL HIGH (ref 0–44)
AST: 110 U/L — ABNORMAL HIGH (ref 15–41)
Albumin: 3.3 g/dL — ABNORMAL LOW (ref 3.5–5.0)
Alkaline Phosphatase: 107 U/L (ref 38–126)
Anion gap: 17 — ABNORMAL HIGH (ref 5–15)
BUN: 20 mg/dL (ref 8–23)
CO2: 26 mmol/L (ref 22–32)
Calcium: 8.6 mg/dL — ABNORMAL LOW (ref 8.9–10.3)
Chloride: 98 mmol/L (ref 98–111)
Creatinine, Ser: 1.72 mg/dL — ABNORMAL HIGH (ref 0.61–1.24)
GFR, Estimated: 42 mL/min — ABNORMAL LOW (ref >60)
Glucose, Bld: 86 mg/dL (ref 70–99)
Potassium: 2.4 mmol/L — CL (ref 3.5–5.1)
Sodium: 141 mmol/L (ref 135–145)
Total Bilirubin: 1 mg/dL (ref 0.3–1.2)
Total Protein: 6.7 g/dL (ref 6.5–8.1)

## 2023-03-18 LAB — ETHANOL: Alcohol, Ethyl (B): 245 mg/dL — ABNORMAL HIGH (ref <10)

## 2023-03-18 LAB — BRAIN NATRIURETIC PEPTIDE: B Natriuretic Peptide: 81.8 pg/mL (ref 0.0–100.0)

## 2023-03-18 LAB — MAGNESIUM: Magnesium: 1.6 mg/dL — ABNORMAL LOW (ref 1.7–2.4)

## 2023-03-18 MED ORDER — CHLORDIAZEPOXIDE HCL 25 MG PO CAPS
25.0000 mg | ORAL_CAPSULE | Freq: Three times a day (TID) | ORAL | Status: AC
  Administered 2023-03-19 – 2023-03-20 (×3): 25 mg via ORAL
  Filled 2023-03-18 (×3): qty 1

## 2023-03-18 MED ORDER — ATORVASTATIN CALCIUM 20 MG PO TABS
20.0000 mg | ORAL_TABLET | Freq: Every day | ORAL | Status: DC
  Administered 2023-03-19 – 2023-03-22 (×4): 20 mg via ORAL
  Filled 2023-03-18 (×4): qty 1

## 2023-03-18 MED ORDER — SPIRONOLACTONE 25 MG PO TABS
25.0000 mg | ORAL_TABLET | Freq: Every day | ORAL | Status: DC
  Administered 2023-03-19 – 2023-03-22 (×4): 25 mg via ORAL
  Filled 2023-03-18 (×4): qty 1

## 2023-03-18 MED ORDER — ALLOPURINOL 100 MG PO TABS
100.0000 mg | ORAL_TABLET | Freq: Every day | ORAL | Status: DC
  Administered 2023-03-19 – 2023-03-22 (×4): 100 mg via ORAL
  Filled 2023-03-18 (×4): qty 1

## 2023-03-18 MED ORDER — ACETAMINOPHEN 650 MG RE SUPP: 650.0000 mg | Freq: Four times a day (QID) | RECTAL | Status: DC | PRN

## 2023-03-18 MED ORDER — NALTREXONE HCL 50 MG PO TABS
100.0000 mg | ORAL_TABLET | Freq: Every day | ORAL | Status: DC
  Administered 2023-03-19 – 2023-03-22 (×4): 100 mg via ORAL
  Filled 2023-03-18 (×5): qty 2

## 2023-03-18 MED ORDER — POTASSIUM CHLORIDE 10 MEQ/100ML IV SOLN
10.0000 meq | INTRAVENOUS | Status: AC
  Administered 2023-03-18 (×4): 10 meq via INTRAVENOUS
  Filled 2023-03-18 (×3): qty 100

## 2023-03-18 MED ORDER — CLOPIDOGREL BISULFATE 75 MG PO TABS
75.0000 mg | ORAL_TABLET | Freq: Every day | ORAL | Status: DC
  Administered 2023-03-19 – 2023-03-22 (×4): 75 mg via ORAL
  Filled 2023-03-18 (×4): qty 1

## 2023-03-18 MED ORDER — CHLORDIAZEPOXIDE HCL 25 MG PO CAPS
25.0000 mg | ORAL_CAPSULE | ORAL | Status: AC
  Administered 2023-03-20 – 2023-03-21 (×2): 25 mg via ORAL
  Filled 2023-03-18 (×2): qty 1

## 2023-03-18 MED ORDER — ONDANSETRON HCL 4 MG PO TABS: 4.0000 mg | ORAL_TABLET | Freq: Four times a day (QID) | ORAL | Status: DC | PRN

## 2023-03-18 MED ORDER — ENOXAPARIN SODIUM 40 MG/0.4ML IJ SOSY
40.0000 mg | PREFILLED_SYRINGE | INTRAMUSCULAR | Status: DC
  Administered 2023-03-19 – 2023-03-22 (×4): 40 mg via SUBCUTANEOUS
  Filled 2023-03-18 (×4): qty 0.4

## 2023-03-18 MED ORDER — ACETAMINOPHEN 325 MG PO TABS
650.0000 mg | ORAL_TABLET | Freq: Four times a day (QID) | ORAL | Status: DC | PRN
  Administered 2023-03-21: 650 mg via ORAL
  Filled 2023-03-18: qty 2

## 2023-03-18 MED ORDER — CHLORDIAZEPOXIDE HCL 25 MG PO CAPS
25.0000 mg | ORAL_CAPSULE | Freq: Four times a day (QID) | ORAL | Status: AC
  Administered 2023-03-18 – 2023-03-19 (×2): 25 mg via ORAL
  Filled 2023-03-18 (×2): qty 1

## 2023-03-18 MED ORDER — ONDANSETRON HCL 4 MG/2ML IJ SOLN: 4.0000 mg | Freq: Four times a day (QID) | INTRAMUSCULAR | Status: DC | PRN

## 2023-03-18 MED ORDER — LACTATED RINGERS IV BOLUS
1000.0000 mL | Freq: Once | INTRAVENOUS | Status: AC
  Administered 2023-03-18: 1000 mL via INTRAVENOUS

## 2023-03-18 MED ORDER — CHLORDIAZEPOXIDE HCL 25 MG PO CAPS
25.0000 mg | ORAL_CAPSULE | Freq: Every day | ORAL | Status: AC
  Administered 2023-03-21: 25 mg via ORAL
  Filled 2023-03-18: qty 1

## 2023-03-18 MED ORDER — ENSURE ENLIVE PO LIQD
237.0000 mL | Freq: Two times a day (BID) | ORAL | Status: DC
  Administered 2023-03-19: 237 mL via ORAL

## 2023-03-18 MED ORDER — THIAMINE MONONITRATE 100 MG PO TABS
100.0000 mg | ORAL_TABLET | Freq: Every day | ORAL | Status: DC
  Administered 2023-03-19 – 2023-03-22 (×4): 100 mg via ORAL
  Filled 2023-03-18 (×5): qty 1

## 2023-03-18 MED ORDER — ADULT MULTIVITAMIN W/MINERALS CH
1.0000 | ORAL_TABLET | Freq: Every day | ORAL | Status: DC
  Administered 2023-03-19 – 2023-03-22 (×4): 1 via ORAL
  Filled 2023-03-18 (×4): qty 1

## 2023-03-18 MED ORDER — POTASSIUM CHLORIDE CRYS ER 20 MEQ PO TBCR
40.0000 meq | EXTENDED_RELEASE_TABLET | ORAL | Status: AC
  Administered 2023-03-18 (×2): 40 meq via ORAL
  Filled 2023-03-18 (×2): qty 2

## 2023-03-18 MED ORDER — TAMSULOSIN HCL 0.4 MG PO CAPS
0.4000 mg | ORAL_CAPSULE | Freq: Every day | ORAL | Status: DC
  Administered 2023-03-19 – 2023-03-22 (×4): 0.4 mg via ORAL
  Filled 2023-03-18 (×4): qty 1

## 2023-03-18 MED ORDER — GABAPENTIN 100 MG PO CAPS
100.0000 mg | ORAL_CAPSULE | Freq: Every day | ORAL | Status: DC
  Administered 2023-03-19 – 2023-03-22 (×4): 100 mg via ORAL
  Filled 2023-03-18 (×4): qty 1

## 2023-03-18 MED ORDER — FOLIC ACID 1 MG PO TABS
1.0000 mg | ORAL_TABLET | Freq: Every day | ORAL | Status: DC
  Administered 2023-03-19 – 2023-03-22 (×4): 1 mg via ORAL
  Filled 2023-03-18 (×4): qty 1

## 2023-03-18 NOTE — ED Notes (Signed)
ED TO INPATIENT HANDOFF REPORT  Name/Age/Gender [name] Phillips 67 y.o. male  Code Status    Code Status Orders  (From admission, onward)           Start     Ordered   03/18/23 1709  Do not attempt resuscitation (DNR)- Limited -Do Not Intubate (DNI)  Continuous       Question Answer Comment  If pulseless and not breathing No CPR or chest compressions.   In Pre-Arrest Conditions (Patient Is Breathing and Has A Pulse) Do not intubate. Provide all appropriate non-invasive medical interventions. Avoid ICU transfer unless indicated or required.   Consent: Discussion documented in EHR or advanced directives reviewed      03/18/23 1709           Code Status History     Date Active Date Inactive Code Status Order ID Comments User Context   08/04/2017 0310 08/07/2017 1750 Full Code [id number]  Camelia Phenes, DO ED   12/19/2014 1310 12/20/2014 1641 Full Code [id number]  Duke Salvia, MD Inpatient   01/02/2014 0718 01/07/2014 1703 Full Code [id number]  Swayze, Ava, DO Inpatient   10/28/2013 0005 10/30/2013 1740 Full Code [id number]  Lynden Oxford, MD ED   08/08/2011 2003 08/14/2011 1604 Full Code [id number]  Wylene Simmer, RN Inpatient       Home/SNF/Other Home  Chief Complaint Alcohol intoxication [facility]) [F10.929]  Level of Care/Admitting Diagnosis ED Disposition     ED Disposition  Admit   Condition  --   Comment  Hospital Area: [facility] [[id number]]  Level of Care: Telemetry [5]  Admit to tele based on following criteria: Complex arrhythmia (Bradycardia/Tachycardia)  May place patient in observation at [facility] or [facility] if equivalent level of care is available:: Yes  Covid Evaluation: Asymptomatic - no recent exposure (last 10 days) testing not required  Diagnosis: Alcohol intoxication [facility]) [[id number]]  Admitting Physician: Alberteen Sam [[id number]]  Attending Physician: Alberteen Sam [[id number]]          Medical  History Past Medical History:  Diagnosis Date     [age] Lead 11/12/2012   Acute kidney injury (HCC) 08/20/2017   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 Catheterization 2005 normal coronary arteries; EF 15-20%; b. 2011 EF normal;  c. 2015 Echo: EF 25-30%;  d. s/p prior AICD with upgrade to MDT single lead ICD ser # [id number] h 2/2 ERI &12/19/2014).   Seizure [facility])    Seizure disorder (HCC) 08/08/2011    Allergies Allergies  Allergen Reactions   Aspirin Shortness Of Breath and Itching   Codeine Hives   Penicillins Hives and Itching    Has patient had a PCN reaction causing immediate rash, facial/tongue/throat swelling, SOB or lightheadedness with hypotension: unknown Has patient had a PCN reaction causing severe rash involving mucus membranes or skin necrosis: unknown Has patient had a PCN reaction that required hospitalization: unknown Has patient had a PCN reaction occurring within the last 10 years: no If all of the above answers are "NO", then may proceed with Cephalosporin use.    Prednisone Hives    IV Location/Drains/Wounds Patient Lines/Drains/Airways Status     Active Line/Drains/Airways     Name Placement date Placement  time Site Days   Peripheral IV 03/18/23 20 G Anterior;Proximal;Right Forearm 03/18/23  1301  Forearm  less than 1            Labs/Imaging Results for orders placed or performed during the hospital encounter of 03/18/23 (from the past 48 hour(s))  CBC     Status: Abnormal   Collection Time: 03/18/23 12:51 PM  Result Value Ref Range   WBC 5.7 4.0 - 10.5 K/uL   RBC 3.48 (L) 4.22 - 5.81 MIL/uL   Hemoglobin 11.4 (L) 13.0 - 17.0 g/dL   HCT 16.1 (L) 09.6 - 04.5 %   MCV 100.0 80.0 - 100.0 fL   MCH 32.8 26.0 - 34.0 pg    MCHC 32.8 30.0 - 36.0 g/dL   RDW 40.9 (H) 81.1 - 91.4 %   Platelets 144 (L) 150 - 400 K/uL   nRBC 0.0 0.0 - 0.2 %    Comment: Performed at [facility], [address], [place], [place] [address]  Comprehensive metabolic panel     Status: Abnormal   Collection Time: 03/18/23 12:51 PM  Result Value Ref Range   Sodium 141 135 - 145 mmol/L   Potassium 2.4 (LL) 3.5 - 5.1 mmol/L    Comment: CRITICAL RESULT CALLED TO, READ BACK BY AND VERIFIED WITH BLAIR, I RN AT 1344 03/18/23 TUCKER, J    Chloride 98 98 - 111 mmol/L   CO2 26 22 - 32 mmol/L   Glucose, Bld 86 70 - 99 mg/dL    Comment: Glucose reference range applies only to samples taken after fasting for at least 8 hours.   BUN 20 8 - 23 mg/dL   Creatinine, Ser 6.21 (H) 0.61 - 1.24 mg/dL   Calcium 8.6 (L) 8.9 - 10.3 mg/dL   Total Protein 6.7 6.5 - 8.1 g/dL   Albumin 3.3 (L) 3.5 - 5.0 g/dL   AST 308 (H) 15 - 41 U/L   ALT 69 (H) 0 - 44 U/L   Alkaline Phosphatase 107 38 - 126 U/L   Total Bilirubin 1.0 0.3 - 1.2 mg/dL   GFR, Estimated 42 (L) >60 mL/min    Comment: (NOTE) Calculated using the CKD-EPI Creatinine Equation (2021)    Anion gap 17 (H) 5 - 15    Comment: Performed at [facility], [address], [place], [place] [address]  Brain natriuretic peptide     Status: None   Collection Time: 03/18/23 12:51 PM  Result Value Ref Range   B Natriuretic Peptide 81.8 0.0 - 100.0 pg/mL    Comment: Performed at [facility], [address] [place], [place] [address]  Ethanol     Status: Abnormal   Collection Time: 03/18/23 12:51 PM  Result Value Ref Range   Alcohol, Ethyl (B) 245 (H) <10 mg/dL    Comment: (NOTE) Lowest detectable limit for serum alcohol is 10 mg/dL.  For medical purposes only. Performed at [facility], [address], [place], [place] [address]    DG Chest Port 1 View  Result Date: 03/18/2023 CLINICAL DATA:  weakness. EXAM: PORTABLE  CHEST 1 VIEW COMPARISON:  Chest radiograph dated August 24, 2022. FINDINGS: The heart size and mediastinal contours are within normal limits. Stable positioning of right-sided defibrillator. Both lungs are clear. No pneumothorax or pleural effusion. No acute osseous abnormality. IMPRESSION: No acute cardiopulmonary findings. Electronically Signed   By: Hart Robinsons M.D.   On: 03/18/2023 15:51    Pending Labs  Unresulted Labs (From admission, onward)     Start     Ordered   03/18/23 1349  Magnesium  Once,   STAT        03/18/23 1348   03/18/23 1251  Urinalysis, Routine w reflex microscopic -Urine, Clean Catch  ONCE - STAT,   URGENT       Question:  Specimen Source  Answer:  Urine, Clean Catch   03/18/23 1250   03/18/23 1251  Rapid urine drug screen (hospital performed)  ONCE - STAT,   STAT        03/18/23 1250   Signed and Held  Magnesium  Add-on,   R        Signed and Held   Signed and Held  HIV Antibody (routine testing w rflx)  (HIV Antibody (Routine testing w reflex) panel)  Tomorrow morning,   R        Signed and Held   Signed and Held  TSH  Tomorrow morning,   R        Signed and Held   Signed and Held  Basic metabolic panel  Tomorrow morning,   R        Signed and Held   Signed and Held  CBC  Tomorrow morning,   R        Signed and Held   Signed and Held  Magnesium  Tomorrow morning,   R        Signed and Held   Signed and Held  Creatinine, serum  (enoxaparin (LOVENOX)    CrCl >/= 30 ml/min)  Weekly,   R     Comments: while on enoxaparin therapy    Signed and Held            Vitals/Pain Today's Vitals   03/18/23 1432 03/18/23 1500 03/18/23 1600 03/18/23 1821  BP:  120/83 (!) 121/97 (!) 121/90  Pulse:  80 87 80  Resp:  16 17 18   Temp: (!) 96.1 F (35.6 C)   98.5 F (36.9 C)  TempSrc: Rectal   Oral  SpO2:  95% 95% 95%  Weight:      Height:      PainSc:        Isolation Precautions No active isolations  Medications Medications  potassium chloride SA  (KLOR-CON M) CR tablet 40 mEq (40 mEq Oral Given 03/18/23 1648)  potassium chloride 10 mEq in 100 mL IVPB (10 mEq Intravenous New Bag/Given 03/18/23 1800)  lactated ringers bolus 1,000 mL (1,000 mLs Intravenous New Bag/Given 03/18/23 1405)    Mobility non-ambulatory

## 2023-03-18 NOTE — Assessment & Plan Note (Signed)
As evidenced by severe loss of subcutaneous muscle mass and fat - Consult dietitian

## 2023-03-18 NOTE — ED Provider Notes (Signed)
[place] EMERGENCY DEPARTMENT AT [facility] Provider Note   CSN: [id number] Arrival date & time: 03/18/23  1211     History  Chief Complaint  Patient presents with   Leg Swelling   Fall    [name] Phillips is a 67 y.o. male.  Pt with c/o general weakness, as well as bilateral leg swelling for past 6 months. Pt limited historian - level 5 caveat. Pt denies chest pain or discomfort. No sob or unusual doe. No abd pain or nvd. No dysuria or gu c/o. No focal or unilateral leg pain or swelling. No focal or unilateral numbness or weakness. Denies orthopnea. No hx dvt or pe. No fever or chills. Relatively poor po intake, indicates has not eaten/drank much today.   The history is provided by the patient, medical records and the EMS personnel. The history is limited by the condition of the patient.  Fall Pertinent negatives include no chest pain, no abdominal pain, no headaches and no shortness of breath.       Home Medications Prior to Admission medications   Medication Sig Start Date End Date Taking? Authorizing Provider  acetaminophen (TYLENOL) 325 MG tablet Take 2 tablets (650 mg total) by mouth every 6 (six) hours as needed. Do not take more than 4000mg  of tylenol per day Patient not taking: Reported on 11/25/2017 07/27/17   Couture, Cortni S, PA-C  atorvastatin (LIPITOR) 20 MG tablet Take 1 tablet (20 mg total) by mouth daily. 06/10/19   Yvette Rack, MD  carvedilol (COREG) 25 MG tablet TAKE 1 TABLET TWICE DAILY WITH MEALS 01/12/18   Yvette Rack, MD  clopidogrel (PLAVIX) 75 MG tablet Take 1 tablet (75 mg total) by mouth daily. At breakfast 08/14/18   Yvette Rack, MD  folic acid (FOLVITE) 1 MG tablet Take 1 tablet (1 mg total) by mouth daily. Patient not taking: Reported on 08/04/2017 04/25/17   Fuller Plan, MD  furosemide (LASIX) 20 MG tablet Take 1 tablet (20 mg total) by mouth daily. 11/10/20   Pricilla Loveless, MD  lidocaine (LIDODERM) 5 % Place 1 patch onto the  skin daily. Remove & Discard patch within 12 hours or as directed by MD 03/09/21   Pricilla Loveless, MD  Multiple Vitamin (MULTIVITAMIN WITH MINERALS) TABS tablet Take 1 tablet by mouth daily.    [provider]  naltrexone (DEPADE) 50 MG tablet TAKE 2 TABLETS BY MOUTH ONCE DAILY *EMERGENCY REFILL* 04/25/20   Katsadouros, Heidi Dach, MD  pantoprazole (PROTONIX) 40 MG tablet TAKE 1 TABLET TWICE DAILY 04/15/18   Agyei, Hermina Staggers, MD  ramipril (ALTACE) 10 MG capsule TAKE 1 CAPSULE TWICE DAILY 01/12/18   Yvette Rack, MD  spironolactone (ALDACTONE) 25 MG tablet Take 1 tablet (25 mg total) by mouth daily. 06/10/19   Yvette Rack, MD  tadalafil (CIALIS) 10 MG tablet Take 1 tablet (10 mg total) by mouth as needed for erectile dysfunction. 04/17/18 04/17/19  Claudean Severance, MD  thiamine (VITAMIN B-1) 100 MG tablet Take 1 tablet (100 mg total) by mouth daily. Patient not taking: Reported on 08/04/2017 06/06/17   Fuller Plan, MD      Allergies    Codeine, Penicillins, and Prednisone    Review of Systems   Review of Systems  Constitutional:  Negative for chills and fever.  HENT:  Negative for sore throat.   Eyes:  Negative for pain and visual disturbance.  Respiratory:  Negative for cough and shortness of breath.  Cardiovascular:  Negative for chest pain and palpitations.  Gastrointestinal:  Negative for abdominal pain and vomiting.  Genitourinary:  Negative for dysuria and flank pain.  Musculoskeletal:  Negative for back pain and neck pain.  Skin:  Negative for rash.  Neurological:  Negative for headaches.  All other systems reviewed and are negative.   Physical Exam Updated Vital Signs BP (!) 123/99 (BP Location: Right Arm)   Pulse 77   Temp (!) 95 F (35 C) (Rectal)   Resp 18   Ht 1.905 m (6\' 3" )   Wt 82.6 kg   SpO2 94%   BMI 22.75 kg/m  Physical Exam Vitals and nursing note reviewed.  Constitutional:      Appearance: Normal appearance. He is well-developed.  HENT:      Head: Atraumatic.     Nose: Nose normal.     Mouth/Throat:     Mouth: Mucous membranes are moist.     Pharynx: Oropharynx is clear.  Eyes:     General: No scleral icterus.    Conjunctiva/sclera: Conjunctivae normal.     Pupils: Pupils are equal, round, and reactive to light.  Neck:     Vascular: No carotid bruit.     Trachea: No tracheal deviation.  Cardiovascular:     Rate and Rhythm: Normal rate and regular rhythm.     Pulses: Normal pulses.     Heart sounds: Normal heart sounds. No murmur heard.    No friction rub. No gallop.  Pulmonary:     Effort: Pulmonary effort is normal. No accessory muscle usage or respiratory distress.     Breath sounds: Normal breath sounds.  Abdominal:     General: Bowel sounds are normal. There is no distension.     Palpations: Abdomen is soft. There is no mass.     Tenderness: There is no abdominal tenderness. There is no guarding.  Genitourinary:    Comments: No cva tenderness. Musculoskeletal:     Cervical back: Normal range of motion and neck supple. No rigidity or tenderness.     Comments: CTLS spine, non tender, aligned, no step off. Good rom bil extremities without pain or focal bony tenderness.  Symmetric bilateral foot, ankle and lower leg edema.   Skin:    General: Skin is warm and dry.     Findings: No rash.  Neurological:     Mental Status: He is alert.     Comments: Alert, speech clear. Motor/sens grossly intact bil.   Psychiatric:        Mood and Affect: Mood normal.     ED Results / Procedures / Treatments   Labs (all labs ordered are listed, but only abnormal results are displayed) Results for orders placed or performed during the hospital encounter of 03/18/23  CBC  Result Value Ref Range   WBC 5.7 4.0 - 10.5 K/uL   RBC 3.48 (L) 4.22 - 5.81 MIL/uL   Hemoglobin 11.4 (L) 13.0 - 17.0 g/dL   HCT 52.8 (L) 41.3 - 24.4 %   MCV 100.0 80.0 - 100.0 fL   MCH 32.8 26.0 - 34.0 pg   MCHC 32.8 30.0 - 36.0 g/dL   RDW 01.0 (H)  27.2 - 15.5 %   Platelets 144 (L) 150 - 400 K/uL   nRBC 0.0 0.0 - 0.2 %  Comprehensive metabolic panel  Result Value Ref Range   Sodium 141 135 - 145 mmol/L   Potassium 2.4 (LL) 3.5 - 5.1 mmol/L   Chloride 98 98 -  111 mmol/L   CO2 26 22 - 32 mmol/L   Glucose, Bld 86 70 - 99 mg/dL   BUN 20 8 - 23 mg/dL   Creatinine, Ser 9.52 (H) 0.61 - 1.24 mg/dL   Calcium 8.6 (L) 8.9 - 10.3 mg/dL   Total Protein 6.7 6.5 - 8.1 g/dL   Albumin 3.3 (L) 3.5 - 5.0 g/dL   AST 841 (H) 15 - 41 U/L   ALT 69 (H) 0 - 44 U/L   Alkaline Phosphatase 107 38 - 126 U/L   Total Bilirubin 1.0 0.3 - 1.2 mg/dL   GFR, Estimated 42 (L) >60 mL/min   Anion gap 17 (H) 5 - 15  Brain natriuretic peptide  Result Value Ref Range   B Natriuretic Peptide 81.8 0.0 - 100.0 pg/mL  Ethanol  Result Value Ref Range   Alcohol, Ethyl (B) 245 (H) <10 mg/dL     EKG EKG Interpretation Date/Time:  Tuesday March 18 2023 13:42:50 EDT Ventricular Rate:  75 PR Interval:  246 QRS Duration:  159 QT Interval:  449 QTC Calculation: 525 R Axis:   2  Text Interpretation: Sinus rhythm Atrial premature complexes Prolonged PR interval Right bundle branch block Non-specific ST-t changes Confirmed by Cathren Laine ([id number]) on 03/18/2023 1:50:14 PM  Radiology No results found.  Procedures Procedures    Medications Ordered in ED Medications  potassium chloride 10 mEq in 100 mL IVPB (has no administration in time range)    ED Course/ Medical Decision Making/ A&P                                 Medical Decision Making Problems Addressed: Acute alcoholic intoxication without complication [facility]): acute illness or injury with systemic symptoms that poses a threat to life or bodily functions AKI (acute kidney injury) [facility]): acute illness or injury Chronic alcoholism (HCC): chronic illness or injury with exacerbation, progression, or side effects of treatment that poses a threat to life or bodily functions Elevated liver function tests:  acute illness or injury Generalized weakness: acute illness or injury with systemic symptoms that poses a threat to life or bodily functions Hypokalemia: acute illness or injury with systemic symptoms that poses a threat to life or bodily functions Hypothermia, initial encounter: acute illness or injury with systemic symptoms that poses a threat to life or bodily functions Stage 3a chronic kidney disease (HCC): chronic illness or injury with exacerbation, progression, or side effects of treatment that poses a threat to life or bodily functions  Amount and/or Complexity of Data Reviewed Independent Historian: EMS    Details: hx External Data Reviewed: labs and notes. Labs: ordered. Decision-making details documented in ED Course. Radiology: ordered and independent interpretation performed. Decision-making details documented in ED Course. ECG/medicine tests: ordered and independent interpretation performed. Decision-making details documented in ED Course. Discussion of management or test interpretation with external provider(s): medicine  Risk Prescription drug management. Decision regarding hospitalization.   Iv ns. Continuous pulse ox and cardiac monitoring. Labs ordered/sent. Imaging ordered.   Differential diagnosis includes electrolyte abn, intoxication, aki, anemia, etc. Dispo decision including potential need for admission considered - will get labs and imaging and reassess.   Reviewed nursing notes and prior charts for additional history. External reports reviewed. Additional history from: EMS.   Cardiac monitor: sinus rhythm, rate 70.  Labs reviewed/interpreted by me -  k v low. Mg added to labs. Kcl iv x 4 runs. Aki, mild.  LR bolus.   Xrays reviewed/interpreted by me - no pna or edema.   Hospitalists consulted for admission.  CRITICAL CARE weakness, severe hypokalemia requiring parenteral replacement, acute/chr alcohol use disorder.  Performed by: Suzi Roots Total  critical care time: 40 minutes Critical care time was exclusive of separately billable procedures and treating other patients. Critical care was necessary to treat or prevent imminent or life-threatening deterioration. Critical care was time spent personally by me on the following activities: development of treatment plan with patient and/or surrogate as well as nursing, discussions with consultants, evaluation of patient's response to treatment, examination of patient, obtaining history from patient or surrogate, ordering and performing treatments and interventions, ordering and review of laboratory studies, ordering and review of radiographic studies, pulse oximetry and re-evaluation of patient's condition.         Final Clinical Impression(s) / ED Diagnoses Final diagnoses:  Generalized weakness  Hypokalemia  Acute alcoholic intoxication without complication (HCC)  Chronic alcoholism (HCC)  AKI (acute kidney injury) (HCC)  Elevated liver function tests  Stage 3a chronic kidney disease (HCC)  Hypothermia, initial encounter    Rx / DC Orders ED Discharge Orders     None         Cathren Laine, MD 03/18/23 1353

## 2023-03-18 NOTE — Assessment & Plan Note (Signed)
-   Suplement K - Check mag

## 2023-03-18 NOTE — Hospital Course (Addendum)
[name] Phillips is a 67 y.o. 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  Evidently, APS came to the home to check on patient today, found him unable to walk or care for himself due to leg swelling, intoxicated.  In the ER, CXR showed cardiomegaly.  He had marked LE edema.  Potassium was 2.4 and he was hypothermic rectal temp 95.  Aching all over, but no focal pain complaints to me.  Placed on warming blanket, given supplemental K and hospitalist service asked to evaluate.

## 2023-03-18 NOTE — ED Triage Notes (Signed)
Per [facility] pt coming from home c/o lower extremity edema x 4-5 months. Also had a fall from bed 4 days ago. C/o left flank pain. EMS was called out by APS.

## 2023-03-18 NOTE — H&P (Signed)
History and Physical    Patient: [name] Phillips ZOX:[id number] DOB: January 14, 1956 DOA: 03/18/2023 DOS: the patient was seen and examined on 03/18/2023 PCP: Hillery Aldo, NP  Patient coming from: Home  Chief Complaint:  Chief Complaint  Patient presents with   Leg Swelling   Fall       HPI:  [name] Phillips is a 67 y.o. 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  Evidently, APS came to the home to check on patient today, found him unable to walk or care for himself due to leg swelling, intoxicated.  In the ER, CXR showed cardiomegaly.  He had marked LE edema.  Potassium was 2.4 and he was hypothermic rectal temp 95.  Aching all over, but no focal pain complaints to me.  Placed on warming blanket, given supplemental K and hospitalist service asked to evaluate.      Review of Systems  Constitutional:  Negative for chills and fever.  Respiratory:  Negative for cough and shortness of breath.   Cardiovascular:  Positive for leg swelling. Negative for chest pain and orthopnea.  Musculoskeletal:  Positive for falls. Negative for back pain, joint pain, myalgias and neck pain.  Neurological:  Positive for weakness. Negative for sensory change, speech change, focal weakness and seizures.  Psychiatric/Behavioral:  Positive for memory loss.   All other systems reviewed and are negative.    Past Medical History:  Diagnosis Date     6949 Lead 11/12/2012   Acute kidney injury (HCC) 08/20/2017   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  Catheterization 2005 normal coronary arteries; EF 15-20%; b. 2011 EF normal;  c. 2015 Echo: EF 25-30%;  d. s/p prior AICD with upgrade to MDT single lead ICD ser # [id number] h 2/2 ERI &12/19/2014).   Seizure [facility])    Seizure disorder (HCC) 08/08/2011   Past Surgical History:  Procedure Laterality Date   CARDIAC DEFIBRILLATOR PLACEMENT  2007   MDT [employer] single chamber ICD implanted with [phone] lead by Dr Ty Hilts for primary prevention   EP IMPLANTABLE DEVICE N/A 12/19/2014   Procedure:  ICD Generator Changeout;  Surgeon: Duke Salvia, MD;  Location: [facility] INVASIVE CV LAB;  Service: Cardiovascular;  Laterality: N/A;   EP IMPLANTABLE DEVICE N/A 12/19/2014   Procedure: Lead Revision;  Surgeon: Duke Salvia, MD;  Location: [facility] INVASIVE CV LAB;  Service: Cardiovascular;  Laterality: N/A;   ESOPHAGOGASTRODUODENOSCOPY (EGD) WITH PROPOFOL N/A 08/06/2017   Procedure: ESOPHAGOGASTRODUODENOSCOPY (EGD) WITH PROPOFOL;  Surgeon: Kathi Der, MD;  Location: [facility] ENDOSCOPY;  Service: Gastroenterology;  Laterality: N/A;   ICD GENERATOR CHANGE  12/19/2014   Social History: Lives with his wife.  Able to ambulate until the last few weeks.  Refuses to use cane or walker.  Drinks daily, most alcohol comes from sisters or from friends.  He reports that he has been smoking cigarettes and cigars. He has a 9.8 pack-year smoking history. He has quit using smokeless tobacco.  His smokeless tobacco use included snuff and chew. He reports current alcohol use of  about 3.0 standard drinks of alcohol per week. He reports that he does not use drugs.  Allergies  Allergen Reactions   Aspirin Shortness Of Breath and Itching   Codeine Hives   Penicillins Hives and Itching    Has patient had a PCN reaction causing immediate rash, facial/tongue/throat swelling, SOB or lightheadedness with hypotension: unknown Has patient had a PCN reaction causing severe rash involving mucus membranes or skin necrosis: unknown Has patient had a PCN  reaction that required hospitalization: unknown Has patient had a PCN reaction occurring within the last 10 years: no If all of the above answers are "NO", then may proceed with Cephalosporin use.    Prednisone Hives    Family History  Problem Relation Age of Onset   Heart disease Father    Thyroid cancer Mother    Hypertension Other    Diabetes type II Sister    Seizures Brother    Lupus Sister     Prior to Admission medications   Medication Sig Start Date End Date Taking? Authorizing Provider  allopurinol (ZYLOPRIM) 100 MG tablet Take 100 mg by mouth in the morning.   Yes [provider]  carvedilol (COREG) 12.5 MG tablet Take 12.5 mg by mouth in the morning.   Yes [provider]  clopidogrel (PLAVIX) 75 MG tablet Take 1 tablet (75 mg total) by mouth daily. At breakfast Patient taking differently: Take 75 mg by mouth in the morning. 08/14/18  Yes Agyei, Hermina Staggers, MD  gabapentin (NEURONTIN) 100 MG capsule Take 100 mg by mouth in the morning.   Yes [provider]  naltrexone (DEPADE) 50 MG tablet TAKE 2 TABLETS BY MOUTH ONCE DAILY *EMERGENCY REFILL* Patient taking differently: Take 100 mg by mouth in the morning. 04/25/20  Yes Katsadouros, Vasilios, MD  pantoprazole (PROTONIX) 40 MG tablet TAKE 1 TABLET TWICE DAILY Patient taking differently: Take 40 mg by mouth daily before breakfast. 04/15/18  Yes Agyei, Obed K, MD  potassium chloride SA (KLOR-CON M) 20 MEQ tablet Take 20 mEq by mouth in the morning.   Yes [provider]  ramipril (ALTACE) 10 MG capsule TAKE 1 CAPSULE TWICE DAILY Patient taking differently: Take 10 mg by mouth in the morning. 01/12/18  Yes Agyei, Hermina Staggers, MD  tamsulosin (FLOMAX) 0.4 MG CAPS capsule Take 0.4 mg by mouth daily after breakfast.   Yes [provider]  torsemide (DEMADEX) 20 MG tablet Take 20 mg by mouth in the morning.   Yes [provider]  Travoprost, BAK Free, (TRAVATAN) 0.004 % SOLN ophthalmic  solution Place 1 drop into both eyes at bedtime.   Yes [provider]  TYLENOL 500 MG tablet Take 500-1,000 mg by mouth every 6 (six) hours as needed for mild pain (pain score 1-3) or headache.   Yes [provider]  acetaminophen (TYLENOL) 325 MG tablet Take 2 tablets (650 mg total) by mouth every 6 (six) hours as needed. Do not take more than 4000mg  of tylenol per day Patient not taking: Reported on 03/18/2023 07/27/17   Couture, Cortni S, PA-C  atorvastatin (LIPITOR) 20 MG tablet Take 1 tablet (20 mg total) by mouth daily. Patient not taking: Reported on 03/18/2023 06/10/19   Yvette Rack, MD  carvedilol (COREG) 25 MG tablet TAKE 1 TABLET TWICE DAILY WITH MEALS Patient not taking: Reported on 03/18/2023 01/12/18   Yvette Rack, MD  folic acid (FOLVITE) 1 MG tablet Take 1 tablet (1 mg total) by mouth daily. Patient not  taking: Reported on 03/18/2023 04/25/17   Fuller Plan, MD  furosemide (LASIX) 20 MG tablet Take 1 tablet (20 mg total) by mouth daily. Patient not taking: Reported on 03/18/2023 11/10/20   Pricilla Loveless, MD  lidocaine (LIDODERM) 5 % Place 1 patch onto the skin daily. Remove & Discard patch within 12 hours or as directed by MD Patient not taking: Reported on 03/18/2023 03/09/21   Pricilla Loveless, MD  spironolactone (ALDACTONE) 25 MG tablet Take 1 tablet (25 mg total) by mouth daily. Patient not taking: Reported on 03/18/2023 06/10/19   Yvette Rack, MD  tadalafil (CIALIS) 10 MG tablet Take 1 tablet (10 mg total) by mouth as needed for erectile dysfunction. Patient not taking: Reported on 03/18/2023 04/17/18 03/18/23  Claudean Severance, MD  thiamine (VITAMIN B-1) 100 MG tablet Take 1 tablet (100 mg total) by mouth daily. Patient not taking: Reported on 03/18/2023 06/06/17   Fuller Plan, MD    Physical Exam: Vitals:   03/18/23 1226 03/18/23 1432 03/18/23 1500 03/18/23 1600  BP:   120/83 (!) 121/97  Pulse:   80 87  Resp:   16 17  Temp: (!) 95  F (35 C) (!) 96.1 F (35.6 C)    TempSrc: Rectal Rectal    SpO2:   95% 95%  Weight:      Height:       Frail elderly adult male, lying in bed, appears debilitated, warming blanket in place.  Makes eye contact, responds to questions Anicteric, conjunctival pink, lids and lashes normal.  No nasal deformity, discharge, or epistaxis Dental hygiene poor, oropharynx dry, lips dry RRR, no murmurs, no JVD, 2+ pitting edema up to the knee bilaterally Respiratory rate normal, lung sounds diminished but no rales or wheezes appreciated Abdomen soft without tenderness palpation or guarding, no ascites or distention Makes eye contact and attends to questions, but scattered and confused, oriented to self, wife, hospital, but short-term memory seems impaired, his concentration is limited.  He has severe generalized weakness.  Speech is somewhat dysarthric due to intoxication. Severe loss of subcutaneous muscle mass and fat diffusely, thenar wasting, temporal wasting    Data Reviewed: Basic metabolic panel shows hypokalemia, creatinine at the higher end of recent baseline Transaminases elevated CBC shows mild anemia, macrocytic Brain natriuretic peptide 81 Alcohol level 245 Chest x-ray shows cardiomegaly, IVCD, no airspace disease or opacities EKG showed sinus rhythm, RBBB, old    Assessment and Plan: * Alcohol intoxication (HCC) Alcohol dependence Presents with BAL 245 mg/dL. Says his sisters provide it for him.  He is too confused to quantify intake.  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   Does not desire to quit, tells his wife "I've been drinking 70 years and I don't want to stop" - CIWA scoring - Start Librium taper - Thiamine and folate    Hypothermia Probably from alcohol intoxication, similar to 12/2013 admission. - Warming blanket - Check TSH    Leg swelling and history of nonischemic cardiomyopathy EF 25-30% in 2015,  recovered by 2020 to 60-65%.  Doesn't follow with Cardiology.    Now with 2-3 months progressive leg swelling.  CXR shows cardiomegaly.  Probably nonadherent to diuretics (and unclear from med list if he is on furosemide or torsemide).  Not hypoxic - Obtain echo - Clinically stable, so will supplement K before starting Lasix - Hold Coreg until echo - Resume spironolactone - Hold ramipril given soft BP  Hypokalemia - Suplement K - Check mag  Protein-calorie malnutrition, severe (HCC) As evidenced by severe loss of subcutaneous muscle mass and fat - Consult dietitian  Macrocytic anemia Due to alcohol.  No clinically observed bleeding or reports of bleeding.  AICD (automatic cardioverter/defibrillator) present    History of CVA (cerebrovascular accident) - Continue Plavix, statin  HTN (hypertension) BP low normal - Hold ramipril, Coreg, diuretics - Continue spironolactone  Transaminitis No abdominal complaints.  From alcohol, no further work up needed.  Cognitive impairment Unclear if this is mostly intoxication or permanent cognitive impairment.  APS are involved in the case and he is significantly debilitated, and wife is asking for placement in long term care.  At present, the patient is drinking and does not desire to stop and, pending his echo, he may be better served to go home with Hospice. - Consult TOC - Consult PT/OT        Advance Care Planning: DNR, confirmed with wife  Consults: None  Family Communication: Wife at bedside  Severity of Illness: The appropriate patient status for this patient is OBSERVATION. Observation status is judged to be reasonable and necessary in order to provide the required intensity of service to ensure the patient's safety. The patient's presenting symptoms, physical exam findings, and initial radiographic and laboratory data in the context of their medical condition is felt to place them at decreased risk for further  clinical deterioration. Furthermore, it is anticipated that the patient will be medically stable for discharge from the hospital within 2 midnights of admission.   Author: Alberteen Sam, MD 03/18/2023 5:18 PM  For on call review [email or web address].

## 2023-03-18 NOTE — Assessment & Plan Note (Signed)
Due to alcohol.  No clinically observed bleeding or reports of bleeding.

## 2023-03-18 NOTE — ED Notes (Signed)
Pt taken off bair hugger.

## 2023-03-18 NOTE — Assessment & Plan Note (Signed)
BP low normal - Hold ramipril, Coreg, diuretics - Continue spironolactone

## 2023-03-18 NOTE — ED Notes (Addendum)
Pt's wife Khaidyn Jewart called and given an updated.

## 2023-03-18 NOTE — Assessment & Plan Note (Signed)
Continue Plavix, statin.

## 2023-03-18 NOTE — Assessment & Plan Note (Signed)
Alcohol dependence Presents with BAL 245 mg/dL. Says his sisters provide it for him.  He is too confused to quantify intake.  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   Does not desire to quit, tells his wife "I've been drinking 70 years and I don't want to stop" - CIWA scoring - Start Librium taper - Thiamine and folate

## 2023-03-18 NOTE — Progress Notes (Signed)
CSW received communication that Primitivo Gauze, APS [phone]) is stating pt is unable to care for himself and requesting SNF. Per Shaune Pascal, pt's spouse contacted [facility] and was told there is an available bed and she'd like for him to go there.   Notified EDP of request for SNF and noted plan is for admission. CSW communicated this information to [place] who states she will follow up tomorrow. Inpatient TOC team to follow.

## 2023-03-18 NOTE — Assessment & Plan Note (Addendum)
Probably from alcohol intoxication, similar to 12/2013 admission. - Warming blanket - Check TSH

## 2023-03-18 NOTE — Assessment & Plan Note (Signed)
EF 25-30% in 2015, recovered by 2020 to 60-65%.  Doesn't follow with Cardiology.    Now with 2-3 months progressive leg swelling.  CXR shows cardiomegaly.  Probably nonadherent to diuretics (and unclear from med list if he is on furosemide or torsemide).  Not hypoxic - Obtain echo - Clinically stable, so will supplement K before starting Lasix - Hold Coreg until echo - Resume spironolactone - Hold ramipril given soft BP

## 2023-03-19 ENCOUNTER — Observation Stay (HOSPITAL_BASED_OUTPATIENT_CLINIC_OR_DEPARTMENT_OTHER): Payer: 59

## 2023-03-19 DIAGNOSIS — I5031 Acute diastolic (congestive) heart failure: Secondary | ICD-10-CM

## 2023-03-19 DIAGNOSIS — F10920 Alcohol use, unspecified with intoxication, uncomplicated: Secondary | ICD-10-CM | POA: Diagnosis not present

## 2023-03-19 LAB — ECHOCARDIOGRAM COMPLETE
AR max vel: 2.99 cm2
AV Peak grad: 4.1 mm[Hg]
Ao pk vel: 1.01 m/s
Area-P 1/2: 8.43 cm2
Calc EF: 46.1 %
Est EF: 45
Height: 75 in
MV M vel: 2.23 m/s
MV Peak grad: 19.9 mm[Hg]
S' Lateral: 3.5 cm
Single Plane A2C EF: 53.5 %
Single Plane A4C EF: 45 %
Weight: 2518.54 [oz_av]

## 2023-03-19 LAB — CBC
HCT: 29.7 % — ABNORMAL LOW (ref 39.0–52.0)
Hemoglobin: 10.1 g/dL — ABNORMAL LOW (ref 13.0–17.0)
MCH: 33.1 pg (ref 26.0–34.0)
MCHC: 34 g/dL (ref 30.0–36.0)
MCV: 97.4 fL (ref 80.0–100.0)
Platelets: 142 10*3/uL — ABNORMAL LOW (ref 150–400)
RBC: 3.05 MIL/uL — ABNORMAL LOW (ref 4.22–5.81)
RDW: 17.3 % — ABNORMAL HIGH (ref 11.5–15.5)
WBC: 5.1 10*3/uL (ref 4.0–10.5)
nRBC: 0 % (ref 0.0–0.2)

## 2023-03-19 LAB — TSH: TSH: 1.852 u[IU]/mL (ref 0.350–4.500)

## 2023-03-19 LAB — BASIC METABOLIC PANEL
Anion gap: 10 (ref 5–15)
BUN: 17 mg/dL (ref 8–23)
CO2: 29 mmol/L (ref 22–32)
Calcium: 8.7 mg/dL — ABNORMAL LOW (ref 8.9–10.3)
Chloride: 103 mmol/L (ref 98–111)
Creatinine, Ser: 1.37 mg/dL — ABNORMAL HIGH (ref 0.61–1.24)
GFR, Estimated: 56 mL/min — ABNORMAL LOW (ref >60)
Glucose, Bld: 91 mg/dL (ref 70–99)
Potassium: 3.1 mmol/L — ABNORMAL LOW (ref 3.5–5.1)
Sodium: 142 mmol/L (ref 135–145)

## 2023-03-19 LAB — MAGNESIUM
Magnesium: 1.2 mg/dL — ABNORMAL LOW (ref 1.7–2.4)
Magnesium: 1.3 mg/dL — ABNORMAL LOW (ref 1.7–2.4)

## 2023-03-19 LAB — HIV ANTIBODY (ROUTINE TESTING W REFLEX): HIV Screen 4th Generation wRfx: NONREACTIVE

## 2023-03-19 MED ORDER — TORSEMIDE 20 MG PO TABS
20.0000 mg | ORAL_TABLET | Freq: Every morning | ORAL | Status: DC
  Administered 2023-03-19 – 2023-03-22 (×4): 20 mg via ORAL
  Filled 2023-03-19 (×5): qty 1

## 2023-03-19 MED ORDER — TORSEMIDE 20 MG PO TABS
20.0000 mg | ORAL_TABLET | Freq: Every morning | ORAL | Status: DC
  Filled 2023-03-19: qty 1

## 2023-03-19 MED ORDER — POTASSIUM CHLORIDE CRYS ER 20 MEQ PO TBCR
40.0000 meq | EXTENDED_RELEASE_TABLET | ORAL | Status: AC
  Administered 2023-03-19 (×2): 40 meq via ORAL
  Filled 2023-03-19 (×2): qty 2

## 2023-03-19 MED ORDER — ENSURE ENLIVE PO LIQD
237.0000 mL | Freq: Four times a day (QID) | ORAL | Status: DC
  Administered 2023-03-19 – 2023-03-22 (×8): 237 mL via ORAL

## 2023-03-19 MED ORDER — CARVEDILOL 12.5 MG PO TABS
12.5000 mg | ORAL_TABLET | Freq: Every morning | ORAL | Status: DC
  Administered 2023-03-19 – 2023-03-21 (×3): 12.5 mg via ORAL
  Filled 2023-03-19 (×4): qty 1

## 2023-03-19 MED ORDER — RAMIPRIL 5 MG PO CAPS
10.0000 mg | ORAL_CAPSULE | Freq: Every morning | ORAL | Status: DC
  Administered 2023-03-19 – 2023-03-21 (×3): 10 mg via ORAL
  Filled 2023-03-19 (×4): qty 2

## 2023-03-19 MED ORDER — MAGNESIUM SULFATE 4 GM/100ML IV SOLN
4.0000 g | Freq: Once | INTRAVENOUS | Status: AC
  Administered 2023-03-19: 4 g via INTRAVENOUS
  Filled 2023-03-19: qty 100

## 2023-03-19 MED ORDER — POTASSIUM CHLORIDE CRYS ER 20 MEQ PO TBCR
20.0000 meq | EXTENDED_RELEASE_TABLET | Freq: Every morning | ORAL | Status: DC
  Administered 2023-03-20 – 2023-03-22 (×3): 20 meq via ORAL
  Filled 2023-03-19 (×3): qty 1

## 2023-03-19 NOTE — Progress Notes (Signed)
TOC will assist with transportation resources, added to AVS

## 2023-03-19 NOTE — Progress Notes (Addendum)
Initial Nutrition Assessment  DOCUMENTATION CODES:   Non-severe (moderate) malnutrition in context of chronic illness  INTERVENTION:   -Ensure Plus High Protein po QID, each supplement provides 350 kcal and 20 grams of protein.   -Magic cup BID with meals, each supplement provides 290 kcal and 9 grams of protein   -Continue vitamin supplementation  -Needs updated weight for admission  NUTRITION DIAGNOSIS:   Moderate Malnutrition related to chronic illness (alcohol abuse) as evidenced by moderate muscle depletion, mild fat depletion.  GOAL:   Patient will meet greater than or equal to 90% of their needs  MONITOR:   PO intake, Supplement acceptance, Labs, Weight trends, I & O's  REASON FOR ASSESSMENT:   Consult Assessment of nutrition requirement/status  ASSESSMENT:   67 y.o. 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  Patient sitting in chair, no family at bedside. Pt reports eating some yogurt, and grapes this morning for breakfast. States he has also been drinking Ensures and drinks 3-4 daily at home when he has cases of it. Spoke briefly about alcohol consumption which patient reports he did drink a pint before coming to the hospital but didn't speak further about it. Encouraged pt to take a daily MVI at home and to continue to drink his Ensures to maintain his weight. Pt would like to gain weight.  Per patient UBW ~180-190 lbs. Reports he last weighed ~149-150 lbs. Pt thinks he has lost a total of 60 lbs. No new weights have been measured in past 4 encounters in [facility]. Will order for new weight today.   Medications: Folic acid, Multivitamin with minerals daily, KLOR-CON, Aldactone, Thiamine, IV Mg sulfate  Labs reviewed: Low K Low Mg   NUTRITION  - FOCUSED PHYSICAL EXAM:  Flowsheet Row Most Recent Value  Orbital Region Mild depletion  Upper Arm Region Moderate depletion  Thoracic and Lumbar Region Mild depletion  Buccal Region Mild depletion  Temple Region Moderate depletion  Clavicle Bone Region Moderate depletion  Clavicle and Acromion Bone Region Moderate depletion  Scapular Bone Region Moderate depletion  Dorsal Hand Moderate depletion  Patellar Region Moderate depletion  Anterior Thigh Region Moderate depletion  Posterior Calf Region Moderate depletion  Edema (RD Assessment) Moderate  Hair Reviewed  [thinning on top]  Eyes Reviewed  Mouth Reviewed  Skin Reviewed  Nails Reviewed       Diet Order:   Diet Order             Diet Heart Room service appropriate? Yes; Fluid consistency: Thin  Diet effective now                   EDUCATION NEEDS:   No education needs have been identified at this time  Skin:  Skin Assessment: Reviewed RN Assessment  Last BM:  PTA  Height:   Ht Readings from Last 1 Encounters:  03/18/23 6\' 3"  (1.905 m)    Weight:   Wt Readings from Last 1 Encounters:  03/18/23 82.6 kg    BMI:  Body mass index is 22.75 kg/m.  Estimated Nutritional Needs:   Kcal:  2300-2500  Protein:  120-130g  Fluid:  2L/day  Tilda Franco, MS, RD, LDN Inpatient Clinical Dietitian Contact information available via Amion

## 2023-03-19 NOTE — Progress Notes (Signed)
Echocardiogram 2D Echocardiogram has been performed.  Lucendia Herrlich 03/19/2023, 4:01 PM

## 2023-03-19 NOTE — Evaluation (Signed)
Occupational Therapy Evaluation Patient Details Name: [name] Phillips MRN: [id number] DOB: 09-Nov-1955 Today's Date: 03/19/2023   History of Present Illness [name] Phillips is a 67 y.o. male presents with generalized weakness, BLE swelling. Evidently, APS came to the home to check on patient today, found him unable to walk or care for himself due to leg swelling, intoxicated. PMH: AICD, CHF, CVA, HTN, seizure, ETOH   Clinical Impression   Patient is a 67 year old male who was admitted for above. Patient session was limited today with patient needing ECHO. Patient was mod A +2 to transfer to bed with RW with increased time and a for weight shifting BLE. Patient was noted to have decreased functional activity tolernace, decreased ROM, decreased BUE strength, decreased endurance, decreased sitting balance, decreased standing balanced, decreased safety awareness, and decreased knowledge of AE/AD impacting participation in ADLs. Patient will benefit from continued inpatient follow up therapy, <3 hours/day       If plan is discharge home, recommend the following: Two people to help with walking and/or transfers;A lot of help with bathing/dressing/bathroom;Assistance with cooking/housework;Direct supervision/assist for medications management;Assist for transportation;Help with stairs or ramp for entrance;Direct supervision/assist for financial management    Functional Status Assessment  Patient has had a recent decline in their functional status and/or demonstrates limited ability to make significant improvements in function in a reasonable and predictable amount of time  Equipment Recommendations  None recommended by OT       Precautions / Restrictions Precautions Precautions: Fall Restrictions Weight Bearing Restrictions: No      Mobility Bed Mobility               General bed mobility comments: pt in recliner upon arrival            Balance Overall balance assessment: Needs  assistance         Standing balance support: During functional activity, Bilateral upper extremity supported, Reliant on assistive device for balance Standing balance-Leahy Scale: Poor       ADL either performed or assessed with clinical judgement   ADL Overall ADL's : Needs assistance/impaired Eating/Feeding: Supervision/ safety;Sitting   Grooming: Sitting;Wash/dry face;Supervision/safety;Wash/dry hands   Upper Body Bathing: Minimal assistance;Sitting   Lower Body Bathing: Bed level;Maximal assistance   Upper Body Dressing : Sitting;Minimal assistance   Lower Body Dressing: Bed level;Maximal assistance   Toilet Transfer: +2 for safety/equipment;Moderate assistance;Stand-pivot;Rolling walker (2 wheels) Toilet Transfer Details (indicate cue type and reason): to recliner in room Toileting- Clothing Manipulation and Hygiene: Total assistance;Sit to/from stand Toileting - Clothing Manipulation Details (indicate cue type and reason): patient reported wearing adult absorbent undergarments at home and being able to change them himself during the day. currently max  for standing balance with TD for hygiene             Vision Baseline Vision/History: 1 Wears glasses              Pertinent Vitals/Pain Pain Assessment Pain Assessment: No/denies pain     Extremity/Trunk Assessment Upper Extremity Assessment Upper Extremity Assessment: Generalized weakness   Lower Extremity Assessment Lower Extremity Assessment: Defer to PT evaluation RLE Deficits / Details: knee flexion 3/5, knee extension 2-/5, hip abduction 3/5, hip adduction 3/5, hip flexion 2-/5, ankle 3+/5 RLE Sensation: WNL RLE Coordination: WNL LLE Deficits / Details: knee flexion 3/5, knee extension 2-/5, hip abduction 3/5, hip adduction 3/5, hip flexion 2-/5, ankle 3+/5 LLE Sensation: WNL LLE Coordination: WNL   Cervical / Trunk Assessment Cervical /  Trunk Assessment: Kyphotic   Communication  Communication Communication: No apparent difficulties   Cognition Arousal: Alert Behavior During Therapy: WFL for tasks assessed/performed Overall Cognitive Status: No family/caregiver present to determine baseline cognitive functioning       General Comments: patient is able to follow commands, man of few words.                Home Living Family/patient expects to be discharged to:: Private residence Living Arrangements: Spouse/significant other Available Help at Discharge: Family Type of Home: Apartment Home Access: Stairs to enter Secretary/administrator of Steps: 3 Entrance Stairs-Rails: None Home Layout: One level     Bathroom Shower/Tub: Chief Strategy Officer: Standard     Home Equipment: Agricultural consultant (2 wheels);Wheelchair - manual          Prior Functioning/Environment Prior Level of Function : History of Falls (last six months)             Mobility Comments: pt reports holds to walls in the home or uses manual w/c, pt reports "I dont know" regarding of number of falls, pt also reports he "sometimes" flips his w/c when the brakes aren't on ADLs Comments: pt reports performing sponge baths, difficulty getting off of toilet, needing significant increased time to complete self care, reports family provides meals        OT Problem List: Decreased activity tolerance;Impaired balance (sitting and/or standing);Decreased coordination;Decreased knowledge of precautions;Decreased safety awareness;Decreased knowledge of use of DME or AE      OT Treatment/Interventions: Self-care/ADL training;Therapeutic exercise;DME and/or AE instruction;Therapeutic activities;Patient/family education;Balance training    OT Goals(Current goals can be found in the care plan section) Acute Rehab OT Goals Patient Stated Goal: to go home OT Goal Formulation: Patient unable to participate in goal setting Time For Goal Achievement: 04/02/23 Potential to Achieve Goals:  Fair  OT Frequency: Min 1X/week       AM-PAC OT "6 Clicks" Daily Activity     Outcome Measure Help from another person eating meals?: A Little Help from another person taking care of personal grooming?: A Lot Help from another person toileting, which includes using toliet, bedpan, or urinal?: A Lot Help from another person bathing (including washing, rinsing, drying)?: A Lot Help from another person to put on and taking off regular upper body clothing?: A Lot Help from another person to put on and taking off regular lower body clothing?: A Lot 6 Click Score: 13   End of Session Equipment Utilized During Treatment: Gait belt;Rolling walker (2 wheels) Nurse Communication: Mobility status  Activity Tolerance: Patient tolerated treatment well Patient left: in bed;with call bell/phone within reach;with bed alarm set (with ECHO nurse)  OT Visit Diagnosis: Unsteadiness on feet (R26.81);Other abnormalities of gait and mobility (R26.89);Muscle weakness (generalized) (M62.81)                Time: 1610-9604 OT Time Calculation (min): 17 min Charges:  OT General Charges $OT Visit: 1 Visit OT Evaluation $OT Eval Moderate Complexity: 1 Mod  Almond Fitzgibbon OTR/L, MS Acute Rehabilitation Department Office# [phone]   Selinda Flavin 03/19/2023, 4:29 PM

## 2023-03-19 NOTE — Progress Notes (Addendum)
PROGRESS NOTE    [name] Phillips  UXL:[id number] DOB: Dec 30, 1955 DOA: 03/18/2023 PCP: [name] Aldo, NP   Brief Narrative:  HPI:  [name] is a 67 y.o. 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   Evidently, APS came to the home to check on patient today, found him unable to walk or care for himself due to leg swelling, intoxicated.   In the ER, CXR showed cardiomegaly.  He had marked LE edema.  Potassium was 2.4 and he was hypothermic rectal temp 95.  Aching all over, but no focal pain complaints to me.   Placed on warming blanket, given supplemental K and hospitalist service asked to evaluate.    Assessment & Plan:   Principal Problem:   Alcohol intoxication (HCC) Active Problems:   Hypothermia   Leg swelling and history of nonischemic cardiomyopathy   Hypokalemia   HTN (hypertension)   History of CVA (cerebrovascular accident)   AICD (automatic cardioverter/defibrillator) present   Macrocytic anemia   Protein-calorie malnutrition, severe (HCC)  Alcohol intoxication (HCC) Alcohol dependence Presents with BAL 245 mg/dL. Says his sisters provide it for him.  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   Does not desire to quit, tells his wife "I've been drinking 70 years and I don't want to stop".  Has been started on Librium tapering protocol and he is not having any withdrawal symptoms.  No tremors.  CIWA below 5.  Continue folate and thiamine.  Hypothermia Probably from alcohol intoxication, similar to 12/2013 admission.  Resolved.  TSH normal.   Leg swelling and history of nonischemic cardiomyopathy EF 25-30% in 2015, recovered by 2020 to 60-65%.  Doesn't follow with Cardiology.   Now with 2-3 months  progressive leg swelling.  CXR shows cardiomegaly.  Probably nonadherent to diuretics (and unclear from med list if he is on furosemide or torsemide).  Not hypoxic.  BNP within normal limit.  Echo pending.  He is not symptomatic with any respiratory symptoms.  Coreg was held for some reason.  Blood pressure elevated, resume Coreg and Aldactone as well as ramipril.   Hypokalemia: Replenished yesterday but is still low.  Will replenish again today.   Protein-calorie malnutrition, severe (HCC) As evidenced by severe loss of subcutaneous muscle mass and fat - Consulted dietitian   Macrocytic anemia Due to alcohol.  No clinically observed bleeding or reports of bleeding.  Hemoglobin stable.   AICD (automatic cardioverter/defibrillator) present   History of CVA (cerebrovascular accident) - Continue Plavix, statin   HTN (hypertension) Blood pressure elevated now, resume home dose of diuretics and Coreg as well as ramipril.   Transaminitis No abdominal complaints.  From alcohol, no further work up needed.   Cognitive impairment Unclear if this is mostly intoxication or permanent cognitive impairment.  APS are involved in the case and he is significantly debilitated, and wife is asking for placement in long term care.  At present, the patient is drinking and does not desire to stop.  TOC, PT OT consulted.  CKD stage IIIa: Stable.  DVT prophylaxis: enoxaparin (LOVENOX) injection 40 mg Start: 03/19/23 1000   Code Status: Limited: Do not attempt resuscitation (DNR) -DNR-LIMITED -Do Not Intubate/DNI   Family Communication:  None present at bedside.  Plan of  care discussed with patient in length and he/she verbalized understanding and agreed with it.  Status is: Observation The patient will require care spanning > 2 midnights and should be moved to inpatient because: Needs to be seen by PT OT.  Family requesting discharge to SNF.   Estimated body mass index is 22.75 kg/m as calculated from the  following:   Height as of this encounter: 6\' 3"  (1.905 m).   Weight as of this encounter: 82.6 kg.    Nutritional Assessment: Body mass index is 22.75 kg/m.Marland Kitchen Seen by dietician.  I agree with the assessment and plan as outlined below: Nutrition Status:        . Skin Assessment: I have examined the patient's skin and I agree with the wound assessment as performed by the wound care RN as outlined below:    Consultants:  None  Procedures:  None  Antimicrobials:  Anti-infectives (From admission, onward)    None         Subjective: Patient seen and examined.  He was fully alert but did have some cognitive deficit.  Could not tell me the name of the hospital although he knew he was in the hospital.  He was able to tell me the year but not month.  Objective: Vitals:   03/19/23 0237 03/19/23 0256 03/19/23 0556 03/19/23 0620  BP: (!) 177/143 (!) 166/98 (!) 174/107 (!) 176/102  Pulse: 84  84   Resp:   18   Temp: 98.6 F (37 C)  98.4 F (36.9 C)   TempSrc: Oral  Oral   SpO2: 97%  98%   Weight:      Height:        Intake/Output Summary (Last 24 hours) at 03/19/2023 1130 Last data filed at 03/19/2023 0942 Gross per 24 hour  Intake 1220 ml  Output 350 ml  Net 870 ml   Filed Weights   03/18/23 1220  Weight: 82.6 kg    Examination:  General exam: Appears calm and comfortable  Respiratory system: Clear to auscultation. Respiratory effort normal. Cardiovascular system: S1 & S2 heard, RRR. No JVD, murmurs, rubs, gallops or clicks. No pedal edema. Gastrointestinal system: Abdomen is nondistended, soft and nontender. No organomegaly or masses felt. Normal bowel sounds heard. Central nervous system: Alert and oriented x 2. No focal neurological deficits. Extremities: Symmetric 5 x 5 power. Skin: No rashes, lesions or ulcers Psychiatry: Judgement and insight appear poor   Data Reviewed: I have personally reviewed following labs and imaging studies  CBC: Recent  Labs  Lab 03/18/23 1251 03/19/23 0512  WBC 5.7 5.1  HGB 11.4* 10.1*  HCT 34.8* 29.7*  MCV 100.0 97.4  PLT 144* 142*   Basic Metabolic Panel: Recent Labs  Lab 03/18/23 1251 03/18/23 1300 03/18/23 2341 03/19/23 0512  NA 141  --   --  142  K 2.4*  --   --  3.1*  CL 98  --   --  103  CO2 26  --   --  29  GLUCOSE 86  --   --  91  BUN 20  --   --  17  CREATININE 1.72*  --   --  1.37*  CALCIUM 8.6*  --   --  8.7*  MG  --  1.6* 1.3* 1.2*   GFR: Estimated Creatinine Clearance: 59.5 mL/min (A) (by C-G formula based on SCr of 1.37 mg/dL (H)). Liver Function Tests: Recent Labs  Lab 03/18/23 1251  AST 110*  ALT 69*  ALKPHOS 107  BILITOT 1.0  PROT 6.7  ALBUMIN 3.3*   No results for input(s): "LIPASE", "AMYLASE" in the last 168 hours. No results for input(s): "AMMONIA" in the last 168 hours. Coagulation Profile: No results for input(s): "INR", "PROTIME" in the last 168 hours. Cardiac Enzymes: No results for input(s): "CKTOTAL", "CKMB", "CKMBINDEX", "TROPONINI" in the last 168 hours. BNP (last 3 results) No results for input(s): "PROBNP" in the last 8760 hours. HbA1C: No results for input(s): "HGBA1C" in the last 72 hours. CBG: No results for input(s): "GLUCAP" in the last 168 hours. Lipid Profile: No results for input(s): "CHOL", "HDL", "LDLCALC", "TRIG", "CHOLHDL", "LDLDIRECT" in the last 72 hours. Thyroid Function Tests: Recent Labs    03/19/23 0512  TSH 1.852   Anemia Panel: No results for input(s): "VITAMINB12", "FOLATE", "FERRITIN", "TIBC", "IRON", "RETICCTPCT" in the last 72 hours. Sepsis Labs: No results for input(s): "PROCALCITON", "LATICACIDVEN" in the last 168 hours.  No results found for this or any previous visit (from the past 240 hour(s)).   Radiology Studies: DG Chest Port 1 View  Result Date: 03/18/2023 CLINICAL DATA:  weakness. EXAM: PORTABLE CHEST 1 VIEW COMPARISON:  Chest radiograph dated August 24, 2022. FINDINGS: The heart size and  mediastinal contours are within normal limits. Stable positioning of right-sided defibrillator. Both lungs are clear. No pneumothorax or pleural effusion. No acute osseous abnormality. IMPRESSION: No acute cardiopulmonary findings. Electronically Signed   By: Hart Robinsons M.D.   On: 03/18/2023 15:51    Scheduled Meds:  allopurinol  100 mg Oral Daily   atorvastatin  20 mg Oral Daily   carvedilol  12.5 mg Oral q AM   chlordiazePOXIDE  25 mg Oral TID   Followed by   Melene Muller ON 03/20/2023] chlordiazePOXIDE  25 mg Oral BH-qamhs   Followed by   Melene Muller ON 03/21/2023] chlordiazePOXIDE  25 mg Oral Daily   clopidogrel  75 mg Oral Daily   enoxaparin (LOVENOX) injection  40 mg Subcutaneous Q24H   feeding supplement  237 mL Oral BID BM   folic acid  1 mg Oral Daily   gabapentin  100 mg Oral Daily   multivitamin with minerals  1 tablet Oral Daily   naltrexone  100 mg Oral Daily   [START ON 03/20/2023] potassium chloride SA  20 mEq Oral q AM   ramipril  10 mg Oral q AM   spironolactone  25 mg Oral Daily   tamsulosin  0.4 mg Oral QPC breakfast   thiamine  100 mg Oral Daily   torsemide  20 mg Oral q AM   Continuous Infusions:   LOS: 0 days   Hughie Closs, MD [facility]  03/19/2023, 11:30 AM   *Please note that this is a verbal dictation therefore any spelling or grammatical errors are due to the "Dragon [employer] One" system interpretation.  Please page via Amion and do not message via secure chat for urgent patient care matters. Secure chat can be used for non urgent patient care matters.  How to contact the [facility] Attending or Consulting provider 7A - 7P or covering provider during after hours 7P -7A, for this patient?  Check the care team in [facility] and look for a) attending/consulting TRH provider listed and b) the [facility] team listed. Page or secure chat 7A-7P. Log into [email or web address] and use [place]'s universal password to access. If you do not have the password, please contact the  hospital operator. Locate the [facility] provider you are looking for under [facility] and  page to a number that you can be directly reached. If you still have difficulty reaching the provider, please page the [facility] (Director on Call) for the Hospitalists listed on amion for assistance.

## 2023-03-19 NOTE — TOC Initial Note (Signed)
Transition of Care [facility]) - Initial/Assessment Note    Patient Details  Name: [name] Phillips MRN: [id number] Date of Birth: July 08, 1955  [facility]) CM/SW Contact:    Howell Rucks, RN Phone Number: 03/19/2023, 9:10 AM  Clinical Narrative: [facility] consult for SNF Placement, PT eval pending, await recommendation                         Patient Goals and CMS Choice            Expected Discharge Plan and Services                                              Prior Living Arrangements/Services                       Activities of Daily Living   ADL Screening (condition at time of admission) Independently performs ADLs?: No Does the patient have a NEW difficulty with bathing/dressing/toileting/self-feeding that is expected to last >3 days?: Yes (Initiates electronic notice to provider for possible OT consult) Does the patient have a NEW difficulty with getting in/out of bed, walking, or climbing stairs that is expected to last >3 days?: Yes (Initiates electronic notice to provider for possible PT consult) Does the patient have a NEW difficulty with communication that is expected to last >3 days?: No Is the patient deaf or have difficulty hearing?: No Does the patient have difficulty seeing, even when wearing glasses/contacts?: No Does the patient have difficulty concentrating, remembering, or making decisions?: No  Permission Sought/Granted                  Emotional Assessment              Admission diagnosis:  Hypokalemia [E87.6] Alcohol intoxication (HCC) [F10.929] Chronic alcoholism (HCC) [F10.20] Elevated liver function tests [R79.89] Generalized weakness [R53.1] AKI (acute kidney injury) (HCC) [N17.9] Hypothermia, initial encounter [T68.XXXA] Acute alcoholic intoxication without complication (HCC) [F10.920] Stage 3a chronic kidney disease (HCC) [N18.31] Patient Active Problem List   Diagnosis Date Noted   Alcohol  intoxication (HCC) 03/18/2023   Macrocytic anemia 03/18/2023   Protein-calorie malnutrition, severe (HCC) 03/18/2023   Hx of Upper GI bleed 08/04/2017   Thrombocytopenia (HCC) 08/04/2017   Unintentional weight loss 06/06/2017   Preventative health care 08/09/2015   ED (erectile dysfunction) of non-organic origin 05/01/2015   Hypothermia 01/02/2014   Leg swelling and history of nonischemic cardiomyopathy    Alcohol abuse 08/08/2011   Tobacco abuse 08/08/2011   HTN (hypertension) 08/08/2011   Systolic CHF (HCC) 08/08/2011   History of CVA (cerebrovascular accident) 08/08/2011   AICD (automatic cardioverter/defibrillator) present 08/08/2011   Hypokalemia 08/08/2011   PCP:  Hillery Aldo, NP Pharmacy:   [facility] [place], [place] - [address] [place] [address] Phone: [phone] Fax: [phone]     Social Determinants of Health (SDOH) Social History: SDOH Screenings   Food Insecurity: No Food Insecurity (03/18/2023)  Housing: Medium Risk (03/18/2023)  Transportation Needs: Unmet Transportation Needs (03/18/2023)  Utilities: Not At Risk (03/18/2023)  Depression (PHQ2-9): Medium Risk (01/13/2019)  Tobacco Use: High Risk (03/18/2023)   SDOH Interventions:     Readmission Risk Interventions     No data to display

## 2023-03-19 NOTE — TOC Progression Note (Signed)
Transition of Care [facility]) - Progression Note    Patient Details  Name: [name] Phillips MRN: [id number] Date of Birth: Jun 05, 1955  Transition [facility]) CM/SW Contact  Howell Rucks, RN Phone Number: 03/19/2023, 2:05 PM  Clinical Narrative:  PT eval completed, recommendation short term rehab/SNF. Call to pt's spouse [name] Phillips), introduced self and role of TOC/NCM, agreeable to short term rehab, prefers [facility]. NCM will fax out for bed offers with [facility] as preferred.           Expected Discharge Plan and Services                                               Social Determinants of Health (SDOH) Interventions SDOH Screenings   Food Insecurity: No Food Insecurity (03/18/2023)  Housing: Medium Risk (03/18/2023)  Transportation Needs: Unmet Transportation Needs (03/18/2023)  Utilities: Not At Risk (03/18/2023)  Depression (PHQ2-9): Medium Risk (01/13/2019)  Tobacco Use: High Risk (03/18/2023)    Readmission Risk Interventions     No data to display

## 2023-03-19 NOTE — [place]2 (Signed)
[place] MEDICAID FL2 LEVEL OF CARE FORM     IDENTIFICATION  Patient Name: [name] Phillips Birthdate: 1955-07-18 Sex: male Admission Date (Current Location): 03/18/2023  [facility] [place] [facility] [place] Number:  Producer, television/film/video and Address:         Provider Number: [id number]  Attending Physician Name and Address:  Hughie Closs, MD  Relative Name and Phone Number:  Jontay, Cornwell (Spouse)  [phone] (Mobile)    Current Level of Care: Hospital Recommended Level of Care: Skilled Nursing Facility Prior Approval Number:    Date Approved/Denied:   PASRR Number: [id number]  Discharge Plan: SNF    Current Diagnoses: Patient Active Problem List   Diagnosis Date Noted   Alcohol intoxication (HCC) 03/18/2023   Macrocytic anemia 03/18/2023   Protein-calorie malnutrition, severe (HCC) 03/18/2023   Hx of Upper GI bleed 08/04/2017   Thrombocytopenia (HCC) 08/04/2017   Unintentional weight loss 06/06/2017   Preventative health care 08/09/2015   ED (erectile dysfunction) of non-organic origin 05/01/2015   Hypothermia 01/02/2014   Leg swelling and history of nonischemic cardiomyopathy    Alcohol abuse 08/08/2011   Tobacco abuse 08/08/2011   HTN (hypertension) 08/08/2011   Systolic CHF (HCC) 08/08/2011   History of CVA (cerebrovascular accident) 08/08/2011   AICD (automatic cardioverter/defibrillator) present 08/08/2011   Hypokalemia 08/08/2011    Orientation RESPIRATION BLADDER Height & Weight     Self, Time, Situation, Place  Normal Continent, External catheter Weight: 71.4 kg Height:  6\' 3"  (190.5 cm)  BEHAVIORAL SYMPTOMS/MOOD NEUROLOGICAL BOWEL NUTRITION STATUS      Continent Diet (Heart Diet)  AMBULATORY STATUS COMMUNICATION OF NEEDS Skin   Extensive Assist Verbally Normal                       Personal Care Assistance Level of Assistance  Feeding, Bathing, Dressing Bathing Assistance: Maximum assistance Feeding assistance: Limited assistance Dressing  Assistance: Maximum assistance     Functional Limitations Info  Sight, Hearing, Speech Sight Info: Impaired (eyeglasses) Hearing Info: Adequate Speech Info: Adequate    SPECIAL CARE FACTORS FREQUENCY  PT (By licensed PT), OT (By licensed OT)     PT Frequency: 5x/wk OT Frequency: 5x/wk            Contractures Contractures Info: Not present    Additional Factors Info  Code Status, Allergies, Psychotropic Code Status Info: DNR Allergies Info: Aspirin, Codeine, Penicillins, Prednisone Psychotropic Info: N/A         Current Medications (03/19/2023):  This is the current hospital active medication list Current Facility-Administered Medications  Medication Dose Route Frequency Provider Last Rate Last Admin   acetaminophen (TYLENOL) tablet 650 mg  650 mg Oral Q6H PRN Danford, Braian Lites, MD       Or   acetaminophen (TYLENOL) suppository 650 mg  650 mg Rectal Q6H PRN Danford, Gayle Lites, MD       allopurinol (ZYLOPRIM) tablet 100 mg  100 mg Oral Daily Alberteen Sam, MD   100 mg at 03/19/23 6295   atorvastatin (LIPITOR) tablet 20 mg  20 mg Oral Daily Danford, Terelle Lites, MD   20 mg at 03/19/23 2841   carvedilol (COREG) tablet 12.5 mg  12.5 mg Oral q AM Hughie Closs, MD   12.5 mg at 03/19/23 1118   chlordiazePOXIDE (LIBRIUM) capsule 25 mg  25 mg Oral TID Alberteen Sam, MD       Followed by   Melene Muller ON 03/20/2023] chlordiazePOXIDE (LIBRIUM) capsule 25 mg  25  mg Oral BH-qamhs Danford, Maxfield Lites, MD       Followed by   Melene Muller ON 03/21/2023] chlordiazePOXIDE (LIBRIUM) capsule 25 mg  25 mg Oral Daily Danford, Bodin Lites, MD       clopidogrel (PLAVIX) tablet 75 mg  75 mg Oral Daily Alberteen Sam, MD   75 mg at 03/19/23 0922   enoxaparin (LOVENOX) injection 40 mg  40 mg Subcutaneous Q24H Alberteen Sam, MD   40 mg at 03/19/23 1324   feeding supplement (ENSURE ENLIVE / ENSURE PLUS) liquid 237 mL  237 mL Oral QID Hughie Closs, MD   237  mL at 03/19/23 1256   folic acid (FOLVITE) tablet 1 mg  1 mg Oral Daily Danford, Karl Lites, MD   1 mg at 03/19/23 4010   gabapentin (NEURONTIN) capsule 100 mg  100 mg Oral Daily Alberteen Sam, MD   100 mg at 03/19/23 0920   multivitamin with minerals tablet 1 tablet  1 tablet Oral Daily Alberteen Sam, MD   1 tablet at 03/19/23 2725   naltrexone (DEPADE) tablet 100 mg  100 mg Oral Daily Alberteen Sam, MD   100 mg at 03/19/23 0923   ondansetron (ZOFRAN) tablet 4 mg  4 mg Oral Q6H PRN Danford, Arlene Lites, MD       Or   ondansetron (ZOFRAN) injection 4 mg  4 mg Intravenous Q6H PRN Danford, Bayard Lites, MD       [START ON 03/20/2023] potassium chloride SA (KLOR-CON M) CR tablet 20 mEq  20 mEq Oral q AM Pahwani, Ravi, MD       ramipril (ALTACE) capsule 10 mg  10 mg Oral q AM Hughie Closs, MD   10 mg at 03/19/23 1249   spironolactone (ALDACTONE) tablet 25 mg  25 mg Oral Daily Alberteen Sam, MD   25 mg at 03/19/23 0921   tamsulosin (FLOMAX) capsule 0.4 mg  0.4 mg Oral QPC breakfast Alberteen Sam, MD   0.4 mg at 03/19/23 3664   thiamine (VITAMIN B1) tablet 100 mg  100 mg Oral Daily Alberteen Sam, MD   100 mg at 03/19/23 4034   torsemide (DEMADEX) tablet 20 mg  20 mg Oral q AM Cherylin Mylar, RPH   20 mg at 03/19/23 1250     Discharge Medications: Please see discharge summary for a list of discharge medications.  Relevant Imaging Results:  Relevant Lab Results:   Additional Information SSN: [id number]  Howell Rucks, RN

## 2023-03-19 NOTE — Evaluation (Signed)
Physical Therapy Evaluation Patient Details Name: [name] Phillips MRN: [id number] DOB: 08/11/1955 Today's Date: 03/19/2023  History of Present Illness  [name] Phillips is a 67 y.o. male presents with generalized weakness, BLE swelling. Evidently, APS came to the home to check on patient today, found him unable to walk or care for himself due to leg swelling, intoxicated. PMH: AICD, CHF, CVA, HTN, seizure, ETOH  Clinical Impression  Pt admitted with above diagnosis. Pt from home with spouse present but unable to assist him physically, reports multiple falls while holding to walls or from w/c when forgetting to lock brakes, family members assisting him up as able. Pt currently needing significant assistance with STS transfer from recliner, max A with RW needing multiple attempts with rocking momentum to rise. Pt performs static marching and small step forward and back with mod A. Educated pt on increasing strength, able to perform seated exercises and encouraged to perform additional sets/reps while up in recliner as able. Patient will benefit from continued inpatient follow up therapy, <3 hours/day. Pt currently with functional limitations due to the deficits listed below (see PT Problem List). Pt will benefit from acute skilled PT to increase their independence and safety with mobility to allow discharge.           If plan is discharge home, recommend the following: Two people to help with walking and/or transfers;A lot of help with bathing/dressing/bathroom;Assistance with cooking/housework;Help with stairs or ramp for entrance   Can travel by private vehicle   No    Equipment Recommendations None recommended by PT  Recommendations for Other Services       Functional Status Assessment Patient has had a recent decline in their functional status and demonstrates the ability to make significant improvements in function in a reasonable and predictable amount of time.     Precautions /  Restrictions Precautions Precautions: Fall Restrictions Weight Bearing Restrictions: No      Mobility  Bed Mobility               General bed mobility comments: pt in recliner upon arrival    Transfers Overall transfer level: Needs assistance Equipment used: Rolling walker (2 wheels) Transfers: Sit to/from Stand Sit to Stand: Max assist           General transfer comment: initial attempt unsuccessful, educated pt on BUE assist on recliner uprights and LE activation to power up to stand, slow to rise up, maintains static knee flexion and crouched trunk posture    Ambulation/Gait Ambulation/Gait assistance: Mod assist   Assistive device: Rolling walker (2 wheels)         General Gait Details: pt needing mod A for static marching, clears foot ~1 inch from floor, very short step forward and backward using RW, maintains flexed forward posture  Stairs            Wheelchair Mobility     Tilt Bed    Modified Rankin (Stroke Patients Only)       Balance Overall balance assessment: Needs assistance         Standing balance support: During functional activity, Bilateral upper extremity supported, Reliant on assistive device for balance Standing balance-Leahy Scale: Poor                               Pertinent Vitals/Pain Pain Assessment Pain Assessment: No/denies pain    Home Living Family/patient expects to be discharged to:: Private residence  Living Arrangements: Spouse/significant other Available Help at Discharge: Family Type of Home: Apartment Home Access: Stairs to enter Entrance Stairs-Rails: None Entrance Stairs-Number of Steps: 3   Home Layout: One level Home Equipment: Agricultural consultant (2 wheels);Wheelchair - manual      Prior Function Prior Level of Function : History of Falls (last six months)             Mobility Comments: pt reports holds to walls in the home or uses manual w/c, pt reports "I dont know"  regarding of number of falls, pt also reports he "sometimes" flips his w/c when the brakes aren't on ADLs Comments: pt reports performing sponge baths, difficulty getting off of toilet, needing significant increased time to complete self care, reports family provides meals     Extremity/Trunk Assessment   Upper Extremity Assessment Upper Extremity Assessment: Defer to OT evaluation    Lower Extremity Assessment Lower Extremity Assessment: Generalized weakness;RLE deficits/detail;LLE deficits/detail RLE Deficits / Details: knee flexion 3/5, knee extension 2-/5, hip abduction 3/5, hip adduction 3/5, hip flexion 2-/5, ankle 3+/5 RLE Sensation: WNL RLE Coordination: WNL LLE Deficits / Details: knee flexion 3/5, knee extension 2-/5, hip abduction 3/5, hip adduction 3/5, hip flexion 2-/5, ankle 3+/5 LLE Sensation: WNL LLE Coordination: WNL    Cervical / Trunk Assessment Cervical / Trunk Assessment: Kyphotic  Communication   Communication Communication: No apparent difficulties  Cognition Arousal: Alert Behavior During Therapy: WFL for tasks assessed/performed Overall Cognitive Status: No family/caregiver present to determine baseline cognitive functioning                                 General Comments: pt aware of being in hospital, unable to state date but able to state year, follows commands        General Comments      Exercises General Exercises - Lower Extremity Long Arc Quad: AROM, Strengthening, Both, 5 reps, Seated Hip Flexion/Marching: AROM, Strengthening, Both, 5 reps, Seated   Assessment/Plan    PT Assessment Patient needs continued PT services  PT Problem List Decreased strength;Decreased range of motion;Decreased activity tolerance;Decreased balance;Decreased mobility;Decreased cognition;Decreased knowledge of use of DME;Decreased safety awareness       PT Treatment Interventions DME instruction;Gait training;Functional mobility  training;Therapeutic activities;Therapeutic exercise;Balance training;Patient/family education    PT Goals (Current goals can be found in the Care Plan section)  Acute Rehab PT Goals Patient Stated Goal: regain strength PT Goal Formulation: With patient Time For Goal Achievement: 04/02/23 Potential to Achieve Goals: Fair    Frequency Min 1X/week     Co-evaluation               AM-PAC PT "6 Clicks" Mobility  Outcome Measure Help needed turning from your back to your side while in a flat bed without using bedrails?: A Lot Help needed moving from lying on your back to sitting on the side of a flat bed without using bedrails?: A Lot Help needed moving to and from a bed to a chair (including a wheelchair)?: A Lot Help needed standing up from a chair using your arms (e.g., wheelchair or bedside chair)?: A Lot Help needed to walk in hospital room?: Total Help needed climbing 3-5 steps with a railing? : Total 6 Click Score: 10    End of Session Equipment Utilized During Treatment: Gait belt Activity Tolerance: Patient tolerated treatment well Patient left: in chair;with call bell/phone within reach;with chair alarm set Nurse  Communication: Mobility status PT Visit Diagnosis: Muscle weakness (generalized) (M62.81);History of falling (Z91.81);Difficulty in walking, not elsewhere classified (R26.2);Unsteadiness on feet (R26.81)    Time: 1140-1201 (+9:53-9:55 limited by BP) PT Time Calculation (min) (ACUTE ONLY): 21 min   Charges:   PT Evaluation $PT Eval Low Complexity: 1 Low PT Treatments $Therapeutic Activity: 8-22 mins PT General Charges $$ ACUTE PT VISIT: 1 Visit         Tori Juanjesus Pepperman PT, DPT 03/19/23, 12:58 PM

## 2023-03-20 DIAGNOSIS — F10920 Alcohol use, unspecified with intoxication, uncomplicated: Secondary | ICD-10-CM | POA: Diagnosis not present

## 2023-03-20 DIAGNOSIS — E440 Moderate protein-calorie malnutrition: Secondary | ICD-10-CM | POA: Insufficient documentation

## 2023-03-20 LAB — BASIC METABOLIC PANEL
Anion gap: 12 (ref 5–15)
BUN: 18 mg/dL (ref 8–23)
CO2: 28 mmol/L (ref 22–32)
Calcium: 9.2 mg/dL (ref 8.9–10.3)
Chloride: 97 mmol/L — ABNORMAL LOW (ref 98–111)
Creatinine, Ser: 1.36 mg/dL — ABNORMAL HIGH (ref 0.61–1.24)
GFR, Estimated: 56 mL/min — ABNORMAL LOW (ref >60)
Glucose, Bld: 102 mg/dL — ABNORMAL HIGH (ref 70–99)
Potassium: 3.5 mmol/L (ref 3.5–5.1)
Sodium: 137 mmol/L (ref 135–145)

## 2023-03-20 LAB — PHOSPHORUS: Phosphorus: 1.7 mg/dL — ABNORMAL LOW (ref 2.5–4.6)

## 2023-03-20 LAB — MAGNESIUM: Magnesium: 1.6 mg/dL — ABNORMAL LOW (ref 1.7–2.4)

## 2023-03-20 MED ORDER — MAGNESIUM SULFATE 2 GM/50ML IV SOLN
2.0000 g | Freq: Once | INTRAVENOUS | Status: AC
  Administered 2023-03-20: 2 g via INTRAVENOUS
  Filled 2023-03-20: qty 50

## 2023-03-20 MED ORDER — POTASSIUM PHOSPHATES 15 MMOLE/5ML IV SOLN
30.0000 mmol | Freq: Once | INTRAVENOUS | Status: AC
  Administered 2023-03-20: 30 mmol via INTRAVENOUS
  Filled 2023-03-20: qty 10

## 2023-03-20 NOTE — Progress Notes (Signed)
PROGRESS NOTE    [name] Phillips  OVF:[id number] DOB: 1956-05-05 DOA: 03/18/2023 PCP: Hillery Aldo, NP   Brief Narrative:  HPI:  [name] Phillips is a 67 y.o. 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   Evidently, APS came to the home to check on patient today, found him unable to walk or care for himself due to leg swelling, intoxicated.   In the ER, CXR showed cardiomegaly.  He had marked LE edema.  Potassium was 2.4 and he was hypothermic rectal temp 95.  Aching all over, but no focal pain complaints to me.   Placed on warming blanket, given supplemental K and hospitalist service asked to evaluate.    Assessment & Plan:   Principal Problem:   Alcohol intoxication (HCC) Active Problems:   Hypothermia   Leg swelling and history of nonischemic cardiomyopathy   Hypokalemia   HTN (hypertension)   History of CVA (cerebrovascular accident)   AICD (automatic cardioverter/defibrillator) present   Macrocytic anemia   Protein-calorie malnutrition, severe (HCC)   Malnutrition of moderate degree  Alcohol intoxication (HCC) Alcohol dependence Presents with BAL 245 mg/dL. Says his sisters provide it for him.  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   Does not desire to quit, tells his wife "I've been drinking 70 years and I don't want to stop".  Has been started on Librium tapering protocol and he is not having any withdrawal symptoms.  No tremors.  CIWA below 5.  Continue folate and thiamine.  Hypothermia Probably from alcohol intoxication, similar to 12/2013 admission.  Resolved.  TSH normal.   Leg swelling and history of nonischemic cardiomyopathy EF 25-30% in 2015, recovered by 2020 to 60-65%.  Doesn't follow with  Cardiology.   Now with 2-3 months progressive leg swelling.  CXR shows cardiomegaly.  Probably nonadherent to diuretics (and unclear from med list if he is on furosemide or torsemide).  Not hypoxic.  BNP within normal limit.  Echo shows reduced/45% ejection fraction however EF was 65% 4 years ago.  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  He is asymptomatic.  Not a candidate for any intervention due to history of noncompliance/nonadherence.  If desired, outpatient cardiology consultation can be obtained.   Hypokalemia: Resolved.  Hypomagnesemia/hypophosphatemia: Replenished both.   Protein-calorie malnutrition, severe (HCC) As evidenced by severe loss of subcutaneous muscle mass and fat - Consulted dietitian   Macrocytic anemia Due to alcohol.  No clinically observed bleeding or reports of bleeding.  Hemoglobin stable.   AICD (automatic cardioverter/defibrillator) present   History of CVA (cerebrovascular accident) - Continue Plavix, statin   HTN (hypertension) Blood pressure now controlled.  Continue home dose of diuretics and Coreg as well as ramipril.   Transaminitis No abdominal complaints.  From alcohol, no further work up needed.   Cognitive impairment Unclear if this is mostly intoxication or permanent cognitive impairment.  APS are involved in the case and he is significantly debilitated, and wife is asking for placement in long term care.  At present, the patient is drinking and does not desire to stop.  TOC, PT OT consulted.  PT OT recommended SNF.  TOC working on placement and with the family.  CKD stage IIIa: Stable.  DVT  PROGRESS NOTE    [name] Phillips  OVF:[id number] DOB: 1956-05-05 DOA: 03/18/2023 PCP: Hillery Aldo, NP   Brief Narrative:  HPI:  [name] Phillips is a 67 y.o. 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   Evidently, APS came to the home to check on patient today, found him unable to walk or care for himself due to leg swelling, intoxicated.   In the ER, CXR showed cardiomegaly.  He had marked LE edema.  Potassium was 2.4 and he was hypothermic rectal temp 95.  Aching all over, but no focal pain complaints to me.   Placed on warming blanket, given supplemental K and hospitalist service asked to evaluate.    Assessment & Plan:   Principal Problem:   Alcohol intoxication (HCC) Active Problems:   Hypothermia   Leg swelling and history of nonischemic cardiomyopathy   Hypokalemia   HTN (hypertension)   History of CVA (cerebrovascular accident)   AICD (automatic cardioverter/defibrillator) present   Macrocytic anemia   Protein-calorie malnutrition, severe (HCC)   Malnutrition of moderate degree  Alcohol intoxication (HCC) Alcohol dependence Presents with BAL 245 mg/dL. Says his sisters provide it for him.  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   Does not desire to quit, tells his wife "I've been drinking 70 years and I don't want to stop".  Has been started on Librium tapering protocol and he is not having any withdrawal symptoms.  No tremors.  CIWA below 5.  Continue folate and thiamine.  Hypothermia Probably from alcohol intoxication, similar to 12/2013 admission.  Resolved.  TSH normal.   Leg swelling and history of nonischemic cardiomyopathy EF 25-30% in 2015, recovered by 2020 to 60-65%.  Doesn't follow with  Cardiology.   Now with 2-3 months progressive leg swelling.  CXR shows cardiomegaly.  Probably nonadherent to diuretics (and unclear from med list if he is on furosemide or torsemide).  Not hypoxic.  BNP within normal limit.  Echo shows reduced/45% ejection fraction however EF was 65% 4 years ago.  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  He is asymptomatic.  Not a candidate for any intervention due to history of noncompliance/nonadherence.  If desired, outpatient cardiology consultation can be obtained.   Hypokalemia: Resolved.  Hypomagnesemia/hypophosphatemia: Replenished both.   Protein-calorie malnutrition, severe (HCC) As evidenced by severe loss of subcutaneous muscle mass and fat - Consulted dietitian   Macrocytic anemia Due to alcohol.  No clinically observed bleeding or reports of bleeding.  Hemoglobin stable.   AICD (automatic cardioverter/defibrillator) present   History of CVA (cerebrovascular accident) - Continue Plavix, statin   HTN (hypertension) Blood pressure now controlled.  Continue home dose of diuretics and Coreg as well as ramipril.   Transaminitis No abdominal complaints.  From alcohol, no further work up needed.   Cognitive impairment Unclear if this is mostly intoxication or permanent cognitive impairment.  APS are involved in the case and he is significantly debilitated, and wife is asking for placement in long term care.  At present, the patient is drinking and does not desire to stop.  TOC, PT OT consulted.  PT OT recommended SNF.  TOC working on placement and with the family.  CKD stage IIIa: Stable.  DVT  PROGRESS NOTE    [name] Phillips  OVF:[id number] DOB: 1956-05-05 DOA: 03/18/2023 PCP: Hillery Aldo, NP   Brief Narrative:  HPI:  [name] Phillips is a 67 y.o. 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   Evidently, APS came to the home to check on patient today, found him unable to walk or care for himself due to leg swelling, intoxicated.   In the ER, CXR showed cardiomegaly.  He had marked LE edema.  Potassium was 2.4 and he was hypothermic rectal temp 95.  Aching all over, but no focal pain complaints to me.   Placed on warming blanket, given supplemental K and hospitalist service asked to evaluate.    Assessment & Plan:   Principal Problem:   Alcohol intoxication (HCC) Active Problems:   Hypothermia   Leg swelling and history of nonischemic cardiomyopathy   Hypokalemia   HTN (hypertension)   History of CVA (cerebrovascular accident)   AICD (automatic cardioverter/defibrillator) present   Macrocytic anemia   Protein-calorie malnutrition, severe (HCC)   Malnutrition of moderate degree  Alcohol intoxication (HCC) Alcohol dependence Presents with BAL 245 mg/dL. Says his sisters provide it for him.  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   Does not desire to quit, tells his wife "I've been drinking 70 years and I don't want to stop".  Has been started on Librium tapering protocol and he is not having any withdrawal symptoms.  No tremors.  CIWA below 5.  Continue folate and thiamine.  Hypothermia Probably from alcohol intoxication, similar to 12/2013 admission.  Resolved.  TSH normal.   Leg swelling and history of nonischemic cardiomyopathy EF 25-30% in 2015, recovered by 2020 to 60-65%.  Doesn't follow with  Cardiology.   Now with 2-3 months progressive leg swelling.  CXR shows cardiomegaly.  Probably nonadherent to diuretics (and unclear from med list if he is on furosemide or torsemide).  Not hypoxic.  BNP within normal limit.  Echo shows reduced/45% ejection fraction however EF was 65% 4 years ago.  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  He is asymptomatic.  Not a candidate for any intervention due to history of noncompliance/nonadherence.  If desired, outpatient cardiology consultation can be obtained.   Hypokalemia: Resolved.  Hypomagnesemia/hypophosphatemia: Replenished both.   Protein-calorie malnutrition, severe (HCC) As evidenced by severe loss of subcutaneous muscle mass and fat - Consulted dietitian   Macrocytic anemia Due to alcohol.  No clinically observed bleeding or reports of bleeding.  Hemoglobin stable.   AICD (automatic cardioverter/defibrillator) present   History of CVA (cerebrovascular accident) - Continue Plavix, statin   HTN (hypertension) Blood pressure now controlled.  Continue home dose of diuretics and Coreg as well as ramipril.   Transaminitis No abdominal complaints.  From alcohol, no further work up needed.   Cognitive impairment Unclear if this is mostly intoxication or permanent cognitive impairment.  APS are involved in the case and he is significantly debilitated, and wife is asking for placement in long term care.  At present, the patient is drinking and does not desire to stop.  TOC, PT OT consulted.  PT OT recommended SNF.  TOC working on placement and with the family.  CKD stage IIIa: Stable.  DVT  1.7*   GFR: Estimated Creatinine Clearance: 51.8 mL/min (A) (by C-G formula based on SCr of 1.36 mg/dL (H)). Liver Function Tests: Recent Labs  Lab 03/18/23 1251  AST 110*  ALT 69*  ALKPHOS 107  BILITOT 1.0  PROT 6.7  ALBUMIN 3.3*   No results for input(s): "LIPASE", "AMYLASE" in the last 168 hours. No results for input(s): "AMMONIA" in the last 168 hours. Coagulation Profile: No results for input(s): "INR", "PROTIME" in the last 168 hours. Cardiac Enzymes: No results for input(s): "CKTOTAL", "CKMB", "CKMBINDEX", "TROPONINI" in the last 168 hours. BNP (last 3 results) No results for input(s): "PROBNP" in the last 8760 hours. HbA1C: No results for input(s): "HGBA1C" in the last 72 hours. CBG: No results for input(s): "GLUCAP" in the last 168 hours. Lipid Profile: No results for input(s): "CHOL", "HDL", "LDLCALC", "TRIG", "CHOLHDL", "LDLDIRECT" in the last 72 hours. Thyroid Function Tests: Recent Labs    03/19/23 0512  TSH 1.852   Anemia Panel: No results for input(s): "VITAMINB12", "FOLATE", "FERRITIN", "TIBC", "IRON", "RETICCTPCT" in the  last 72 hours. Sepsis Labs: No results for input(s): "PROCALCITON", "LATICACIDVEN" in the last 168 hours.  No results found for this or any previous visit (from the past 240 hour(s)).   Radiology Studies: ECHOCARDIOGRAM COMPLETE  Result Date: 03/19/2023    ECHOCARDIOGRAM REPORT   Patient Name:   TREYCE QUEENER Date of Exam: 03/19/2023 Medical Rec #:  [id number]      Height:       75.0 in Accession #:    [id number]     Weight:       182.0 lb Date of Birth:  1955/09/23       BSA:          2.108 m Patient Age:    67 years       BP:           176/102 mmHg Patient Gender: M              HR:           48 bpm. Exam Location:  Inpatient Procedure: 2D Echo, Cardiac Doppler and Color Doppler Indications:    CHF-Acute Diastolic I50.31  History:        Patient has prior history of Echocardiogram examinations, most                 recent 01/22/2019. CHF and Cardiomyopathy, Stroke; Risk                 Factors:Hypertension and Current Smoker.  Sonographer:    Lucendia Herrlich RCS Referring Phys: [id number] CHRISTOPHER P DANFORD IMPRESSIONS  1. Left ventricular ejection fraction, by estimation, is 45%. The left ventricle has mildly decreased function. The left ventricle demonstrates global hypokinesis. There is moderate concentric left ventricular hypertrophy. Left ventricular diastolic parameters are consistent with Grade I diastolic dysfunction (impaired relaxation).  2. Right ventricular systolic function is normal. The right ventricular size is normal. There is normal pulmonary artery systolic pressure. The estimated right ventricular systolic pressure is 16.7 mmHg.  3. The mitral valve is normal in structure. Trivial mitral valve regurgitation. No evidence of mitral stenosis.  4. The aortic valve is tricuspid. There is mild calcification of the aortic valve. Aortic valve regurgitation is not visualized. No aortic stenosis is present.  5. The inferior vena cava is normal in size with greater than 50% respiratory  variability, suggesting right atrial pressure of 3 mmHg. FINDINGS  Left Ventricle: Left ventricular ejection fraction, by  1.7*   GFR: Estimated Creatinine Clearance: 51.8 mL/min (A) (by C-G formula based on SCr of 1.36 mg/dL (H)). Liver Function Tests: Recent Labs  Lab 03/18/23 1251  AST 110*  ALT 69*  ALKPHOS 107  BILITOT 1.0  PROT 6.7  ALBUMIN 3.3*   No results for input(s): "LIPASE", "AMYLASE" in the last 168 hours. No results for input(s): "AMMONIA" in the last 168 hours. Coagulation Profile: No results for input(s): "INR", "PROTIME" in the last 168 hours. Cardiac Enzymes: No results for input(s): "CKTOTAL", "CKMB", "CKMBINDEX", "TROPONINI" in the last 168 hours. BNP (last 3 results) No results for input(s): "PROBNP" in the last 8760 hours. HbA1C: No results for input(s): "HGBA1C" in the last 72 hours. CBG: No results for input(s): "GLUCAP" in the last 168 hours. Lipid Profile: No results for input(s): "CHOL", "HDL", "LDLCALC", "TRIG", "CHOLHDL", "LDLDIRECT" in the last 72 hours. Thyroid Function Tests: Recent Labs    03/19/23 0512  TSH 1.852   Anemia Panel: No results for input(s): "VITAMINB12", "FOLATE", "FERRITIN", "TIBC", "IRON", "RETICCTPCT" in the  last 72 hours. Sepsis Labs: No results for input(s): "PROCALCITON", "LATICACIDVEN" in the last 168 hours.  No results found for this or any previous visit (from the past 240 hour(s)).   Radiology Studies: ECHOCARDIOGRAM COMPLETE  Result Date: 03/19/2023    ECHOCARDIOGRAM REPORT   Patient Name:   TREYCE QUEENER Date of Exam: 03/19/2023 Medical Rec #:  [id number]      Height:       75.0 in Accession #:    [id number]     Weight:       182.0 lb Date of Birth:  1955/09/23       BSA:          2.108 m Patient Age:    67 years       BP:           176/102 mmHg Patient Gender: M              HR:           48 bpm. Exam Location:  Inpatient Procedure: 2D Echo, Cardiac Doppler and Color Doppler Indications:    CHF-Acute Diastolic I50.31  History:        Patient has prior history of Echocardiogram examinations, most                 recent 01/22/2019. CHF and Cardiomyopathy, Stroke; Risk                 Factors:Hypertension and Current Smoker.  Sonographer:    Lucendia Herrlich RCS Referring Phys: [id number] CHRISTOPHER P DANFORD IMPRESSIONS  1. Left ventricular ejection fraction, by estimation, is 45%. The left ventricle has mildly decreased function. The left ventricle demonstrates global hypokinesis. There is moderate concentric left ventricular hypertrophy. Left ventricular diastolic parameters are consistent with Grade I diastolic dysfunction (impaired relaxation).  2. Right ventricular systolic function is normal. The right ventricular size is normal. There is normal pulmonary artery systolic pressure. The estimated right ventricular systolic pressure is 16.7 mmHg.  3. The mitral valve is normal in structure. Trivial mitral valve regurgitation. No evidence of mitral stenosis.  4. The aortic valve is tricuspid. There is mild calcification of the aortic valve. Aortic valve regurgitation is not visualized. No aortic stenosis is present.  5. The inferior vena cava is normal in size with greater than 50% respiratory  variability, suggesting right atrial pressure of 3 mmHg. FINDINGS  Left Ventricle: Left ventricular ejection fraction, by  PROGRESS NOTE    [name] Phillips  OVF:[id number] DOB: 1956-05-05 DOA: 03/18/2023 PCP: Hillery Aldo, NP   Brief Narrative:  HPI:  [name] Phillips is a 67 y.o. 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   Evidently, APS came to the home to check on patient today, found him unable to walk or care for himself due to leg swelling, intoxicated.   In the ER, CXR showed cardiomegaly.  He had marked LE edema.  Potassium was 2.4 and he was hypothermic rectal temp 95.  Aching all over, but no focal pain complaints to me.   Placed on warming blanket, given supplemental K and hospitalist service asked to evaluate.    Assessment & Plan:   Principal Problem:   Alcohol intoxication (HCC) Active Problems:   Hypothermia   Leg swelling and history of nonischemic cardiomyopathy   Hypokalemia   HTN (hypertension)   History of CVA (cerebrovascular accident)   AICD (automatic cardioverter/defibrillator) present   Macrocytic anemia   Protein-calorie malnutrition, severe (HCC)   Malnutrition of moderate degree  Alcohol intoxication (HCC) Alcohol dependence Presents with BAL 245 mg/dL. Says his sisters provide it for him.  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   Does not desire to quit, tells his wife "I've been drinking 70 years and I don't want to stop".  Has been started on Librium tapering protocol and he is not having any withdrawal symptoms.  No tremors.  CIWA below 5.  Continue folate and thiamine.  Hypothermia Probably from alcohol intoxication, similar to 12/2013 admission.  Resolved.  TSH normal.   Leg swelling and history of nonischemic cardiomyopathy EF 25-30% in 2015, recovered by 2020 to 60-65%.  Doesn't follow with  Cardiology.   Now with 2-3 months progressive leg swelling.  CXR shows cardiomegaly.  Probably nonadherent to diuretics (and unclear from med list if he is on furosemide or torsemide).  Not hypoxic.  BNP within normal limit.  Echo shows reduced/45% ejection fraction however EF was 65% 4 years ago.  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  He is asymptomatic.  Not a candidate for any intervention due to history of noncompliance/nonadherence.  If desired, outpatient cardiology consultation can be obtained.   Hypokalemia: Resolved.  Hypomagnesemia/hypophosphatemia: Replenished both.   Protein-calorie malnutrition, severe (HCC) As evidenced by severe loss of subcutaneous muscle mass and fat - Consulted dietitian   Macrocytic anemia Due to alcohol.  No clinically observed bleeding or reports of bleeding.  Hemoglobin stable.   AICD (automatic cardioverter/defibrillator) present   History of CVA (cerebrovascular accident) - Continue Plavix, statin   HTN (hypertension) Blood pressure now controlled.  Continue home dose of diuretics and Coreg as well as ramipril.   Transaminitis No abdominal complaints.  From alcohol, no further work up needed.   Cognitive impairment Unclear if this is mostly intoxication or permanent cognitive impairment.  APS are involved in the case and he is significantly debilitated, and wife is asking for placement in long term care.  At present, the patient is drinking and does not desire to stop.  TOC, PT OT consulted.  PT OT recommended SNF.  TOC working on placement and with the family.  CKD stage IIIa: Stable.  DVT

## 2023-03-20 NOTE — Progress Notes (Signed)
CCMD notified that this pt had a 9 beat run of v-tach. No complaints of chest pain or SOB, and VSS. Anthoney Harada, NP made aware. New orders placed for lab draws.

## 2023-03-20 NOTE — TOC Progression Note (Addendum)
Transition of Care [facility]) - Progression Note    Patient Details  Name: [name] Phillips MRN: [id number] Date of Birth: December 10, 1955  [facility]) CM/SW Contact  Howell Rucks, RN Phone Number: 03/20/2023, 11:12 AM  Clinical Narrative:   Call received from [facility] w/APS to follow up, reports she visited pt at bedside on yesterday, 03/19/23. Confirmed PT recommendation for short term rehab. Camille requesting update on bed offer accepted and dc.  Contact: [phone].  -1:31pm Met with pt and spouse at bedside to review short term rehab/SNF bed offers,  spouse selected [facility]. NCM will initiate insurance auth. MOON  completed.   -2:42pm [employer] [place] auth initiated via [place], SNF auth improved. Auth ID: [id number], days approved: 03/21/2023-03/23/2023. Team notified.   -4:14pm Call to [facility] and Rehab, transferred to Admissions, vm left for Jane-admissions coordinator, informing insurance auth approved for short term rehab, requesting call back to  verify bed availability tomorrow, 10/25, await call back.          Expected Discharge Plan and Services                                               Social Determinants of Health (SDOH) Interventions SDOH Screenings   Food Insecurity: No Food Insecurity (03/18/2023)  Housing: Medium Risk (03/18/2023)  Transportation Needs: Unmet Transportation Needs (03/18/2023)  Utilities: Not At Risk (03/18/2023)  Depression (PHQ2-9): Medium Risk (01/13/2019)  Tobacco Use: High Risk (03/18/2023)    Readmission Risk Interventions     No data to display

## 2023-03-21 ENCOUNTER — Observation Stay (HOSPITAL_COMMUNITY): Payer: 59

## 2023-03-21 DIAGNOSIS — F10920 Alcohol use, unspecified with intoxication, uncomplicated: Secondary | ICD-10-CM | POA: Diagnosis not present

## 2023-03-21 LAB — BASIC METABOLIC PANEL
Anion gap: 12 (ref 5–15)
BUN: 22 mg/dL (ref 8–23)
CO2: 29 mmol/L (ref 22–32)
Calcium: 9.1 mg/dL (ref 8.9–10.3)
Chloride: 95 mmol/L — ABNORMAL LOW (ref 98–111)
Creatinine, Ser: 1.47 mg/dL — ABNORMAL HIGH (ref 0.61–1.24)
GFR, Estimated: 51 mL/min — ABNORMAL LOW (ref >60)
Glucose, Bld: 111 mg/dL — ABNORMAL HIGH (ref 70–99)
Potassium: 3.2 mmol/L — ABNORMAL LOW (ref 3.5–5.1)
Sodium: 136 mmol/L (ref 135–145)

## 2023-03-21 LAB — MAGNESIUM: Magnesium: 1.6 mg/dL — ABNORMAL LOW (ref 1.7–2.4)

## 2023-03-21 LAB — PHOSPHORUS: Phosphorus: 2.6 mg/dL (ref 2.5–4.6)

## 2023-03-21 MED ORDER — MUSCLE RUB 10-15 % EX CREA
TOPICAL_CREAM | CUTANEOUS | Status: DC | PRN
  Administered 2023-03-21: 1 via TOPICAL
  Filled 2023-03-21: qty 85

## 2023-03-21 MED ORDER — MAGNESIUM SULFATE 2 GM/50ML IV SOLN
2.0000 g | Freq: Once | INTRAVENOUS | Status: AC
  Administered 2023-03-21: 2 g via INTRAVENOUS
  Filled 2023-03-21: qty 50

## 2023-03-21 NOTE — Plan of Care (Signed)
  Problem: Education: Goal: Knowledge of General Education information will improve Description: Including pain rating scale, medication(s)/side effects and non-pharmacologic comfort measures Outcome: Progressing   Problem: Health Behavior/Discharge Planning: Goal: Ability to manage health-related needs will improve Outcome: Progressing   Problem: Clinical Measurements: Goal: Ability to maintain clinical measurements within normal limits will improve Outcome: Progressing Goal: Will remain free from infection Outcome: Progressing Goal: Diagnostic test results will improve Outcome: Progressing Goal: Respiratory complications will improve Outcome: Progressing Goal: Cardiovascular complication will be avoided Outcome: Progressing   Problem: Activity: Goal: Risk for activity intolerance will decrease Outcome: Progressing   Problem: Nutrition: Goal: Adequate nutrition will be maintained Outcome: Progressing   Problem: Coping: Goal: Level of anxiety will decrease Outcome: Progressing   Problem: Elimination: Goal: Will not experience complications related to bowel motility Outcome: Progressing Goal: Will not experience complications related to urinary retention Outcome: Progressing   Problem: Pain Management: Goal: General experience of comfort will improve Outcome: Progressing   Problem: Safety: Goal: Ability to remain free from injury will improve Outcome: Progressing   Problem: Skin Integrity: Goal: Risk for impaired skin integrity will decrease Outcome: Progressing   Problem: Health Behavior/Discharge Planning: Goal: Ability to identify changes in lifestyle to reduce recurrence of condition will improve Outcome: Progressing Goal: Identification of resources available to assist in meeting health care needs will improve Outcome: Progressing   Problem: Physical Regulation: Goal: Complications related to the disease process, condition or treatment will be avoided or  minimized Outcome: Progressing   Problem: Safety: Goal: Ability to remain free from injury will improve Outcome: Progressing   Problem: Education: Goal: Ability to demonstrate management of disease process will improve Outcome: Progressing Goal: Ability to verbalize understanding of medication therapies will improve Outcome: Progressing Goal: Individualized Educational Video(s) Outcome: Progressing   Problem: Activity: Goal: Capacity to carry out activities will improve Outcome: Progressing   Problem: Cardiac: Goal: Ability to achieve and maintain adequate cardiopulmonary perfusion will improve Outcome: Progressing

## 2023-03-21 NOTE — Progress Notes (Signed)
Bedside swallow evaluation completed full report to follow.  Patient demonstrating clinical indications of dysphagia and concern for pharyngeal retention as well as potential airway infiltration.  This occurs much worse with solids rather than liquids.  Multiple swallows with solids and coughing post swallow noted.  Patient reports that this is essentially his baseline but instrumental eval would provide him with compensation strategies.  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  Will plan MBS today to help patient maximize his airway protection and mitigate dysphagia for maximal nutrition and comfort.  Scheduled for approximately 10:45 AM.  Thank you for this order.  Rolena Infante, MS [facility] SLP Acute Rehab Services Office [phone] .

## 2023-03-21 NOTE — TOC Progression Note (Addendum)
Transition of Care [facility]) - Progression Note    Patient Details  Name: FREEMAN ZESATI MRN: [id number] Date of Birth: July 17, 1955  [facility]) CM/SW Contact  Howell Rucks, RN Phone Number: 03/21/2023, 9:20 AM  Clinical Narrative:   Text received from [facility] w/[place] on 03/20/23 at 4:23pm, confirmed bed available tomorrow, 10/25 for transfer, report if pt not ready for dc on 10/25, facility can admit over the weekend. Team notified.          Expected Discharge Plan and Services                                               Social Determinants of Health (SDOH) Interventions SDOH Screenings   Food Insecurity: No Food Insecurity (03/18/2023)  Housing: Medium Risk (03/18/2023)  Transportation Needs: Unmet Transportation Needs (03/18/2023)  Utilities: Not At Risk (03/18/2023)  Depression (PHQ2-9): Medium Risk (01/13/2019)  Tobacco Use: High Risk (03/18/2023)    Readmission Risk Interventions     No data to display

## 2023-03-21 NOTE — Evaluation (Signed)
Clinical/Bedside Swallow Evaluation Patient Details  Name: [name] Phillips MRN: [id number] Date of Birth: May 17, 1956  Today's Date: 03/21/2023 Time: SLP Start Time (ACUTE ONLY): 0935 SLP Stop Time (ACUTE ONLY): 0959 SLP Time Calculation (min) (ACUTE ONLY): 24 min  Past Medical History:  Past Medical History:  Diagnosis Date     6949 Lead 11/12/2012   Acute kidney injury (HCC) 08/20/2017   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 Catheterization 2005 normal coronary arteries; EF 15-20%; b. 2011 EF normal;  c. 2015 Echo: EF 25-30%;  d. s/p prior AICD with upgrade to MDT single lead ICD ser # [id number] h 2/2 ERI &12/19/2014).   Seizure [facility])    Seizure disorder (HCC) 08/08/2011   Past Surgical History:  Past Surgical History:  Procedure Laterality Date   CARDIAC DEFIBRILLATOR PLACEMENT  2007   MDT [employer] single chamber ICD implanted with [phone] lead by Dr Ty Hilts for primary prevention   EP IMPLANTABLE DEVICE N/A 12/19/2014   Procedure:  ICD Generator Changeout;  Surgeon: Duke Salvia, MD;  Location: [facility] INVASIVE CV LAB;  Service: Cardiovascular;  Laterality: N/A;   EP IMPLANTABLE DEVICE N/A 12/19/2014   Procedure: Lead Revision;  Surgeon: Duke Salvia, MD;  Location: [facility] INVASIVE CV LAB;  Service: Cardiovascular;  Laterality: N/A;   ESOPHAGOGASTRODUODENOSCOPY (EGD) WITH PROPOFOL N/A 08/06/2017   Procedure: ESOPHAGOGASTRODUODENOSCOPY (EGD) WITH PROPOFOL;  Surgeon: Kathi Der, MD;  Location: [facility] ENDOSCOPY;  Service: Gastroenterology;  Laterality: N/A;   ICD GENERATOR CHANGE  12/19/2014   HPI:  67 yo male adm to [facility] on 10/23 with weakness, patient reported fall at home - Has h/o COPD, ETOH use, malnutrition, tobacco use, CVA - chronic lacunar bilateral basal  ganglia CVAs, 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  Swallow evaluation ordered.    Assessment / Plan / Recommendation  Clinical Impression  Patient demonstrating clinical indications of dysphagia and concern for pharyngeal retention as well as potential airway infiltration.  This occurs much worse with solids rather than liquids.  Multiple swallows with solids and coughing post swallow noted.  Patient reports that this is essentially his baseline but instrumental eval would provide him with compensation strategies.     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  Will plan MBS today to help patient maximize his airway protection and mitigate dysphagia for maximal nutrition and comfort.  Scheduled for approximately 10:45 AM.  Thank you for this order. SLP Visit Diagnosis: Dysphagia, oropharyngeal phase (R13.12)    Aspiration Risk  Moderate aspiration risk    Diet Recommendation Regular;Thin liquid    Liquid Administration via: Cup;Straw Medication Administration: Other (Comment) (as tolerated) Compensations: Slow rate;Small sips/bites Postural Changes: Seated upright at 90 degrees;Remain upright for at least 30 minutes after po intake    Other  Recommendations Oral Care Recommendations: Oral care BID    Recommendations for follow up therapy are one component of a multi-disciplinary discharge planning process, led by the attending physician.  Recommendations may be  updated based on patient status, additional functional criteria and insurance authorization.  Follow up Recommendations Skilled nursing-short term rehab (<3 hours/day)      Assistance Recommended at Discharge    Functional  Status Assessment Patient has had a recent decline in their functional status and demonstrates the ability to make significant improvements in function in a reasonable and predictable amount of time.  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  Swallow evaluation ordered. Type of Study: Bedside Swallow Evaluation Previous Swallow Assessment: None in epic Diet Prior to this Study: Regular;Thin liquids (Level 0) Temperature Spikes Noted: No Respiratory Status: Room air History of Recent Intubation: No Behavior/Cognition: Alert;Cooperative;Pleasant mood Oral Cavity Assessment: Within Functional Limits Oral Care Completed by SLP: No Oral Cavity - Dentition: Dentures, top;Dentures, bottom Vision: Functional for self-feeding Self-Feeding Abilities: Able to feed self Patient Positioning: Upright in bed Baseline Vocal Quality: Low vocal intensity Volitional Cough: Strong Volitional Swallow: Able to elicit    Oral/Motor/Sensory Function Overall Oral Motor/Sensory Function: Within functional limits   Ice Chips Ice chips: Not tested   Thin Liquid Thin Liquid: Impaired Presentation: Self Fed;Straw Pharyngeal  Phase Impairments: Cough - Delayed    Nectar Thick Nectar Thick Liquid: Not tested   Honey Thick Honey Thick Liquid: Not tested   Puree Puree: Impaired Presentation: Self Fed;Spoon Pharyngeal Phase Impairments: Multiple swallows;Cough - Delayed;Throat  Clearing - Delayed;Wet Vocal Quality   Solid     Solid: Impaired Presentation: Self Fed Oral Phase Impairments: Reduced lingual movement/coordination;Impaired mastication Oral Phase Functional Implications: Impaired mastication Pharyngeal Phase Impairments: Suspected delayed Swallow;Cough - Delayed      Chales Abrahams 03/21/2023,11:54 AM  Rolena Infante, MS [facility] SLP Acute Rehab Services Office [phone]

## 2023-03-21 NOTE — Progress Notes (Signed)
Physical Therapy Treatment Patient Details Name: [name] Phillips MRN: [id number] DOB: [birth year] Today's Date: 03/21/2023   History of Present Illness [name] Phillips is a 67 y.o. male presents with generalized weakness, BLE swelling. Evidently, APS came to the home to check on patient today, found him unable to walk or care for himself due to leg swelling, intoxicated. PMH: AICD, CHF, CVA, HTN, seizure, ETOH    PT Comments  Pt lives at home with Spouse.  Pt admits to using a wheelchair to get around within the home.  "My legs are weak".  Assisted to EOB was difficult.  General bed mobility comments: required Max/Total Assist to transition to EOB.  Severe posterior lean. General transfer comment: required Max Assist + 2 side by side to partially stand.  B hips and knees flexed.  Pt was only able to take a few side steps to [facility].  VERY weak. Assisted back to bed for a MBSS. Pt will need ST Rehab at SNF to address mobility and functional decline prior to safely returning home.    If plan is discharge home, recommend the following:     Can travel by private vehicle     No  Equipment Recommendations  None recommended by PT    Recommendations for Other Services       Precautions / Restrictions Precautions Precautions: Fall Restrictions Weight Bearing Restrictions: No     Mobility  Bed Mobility Overal bed mobility: Needs Assistance Bed Mobility: Supine to Sit     Supine to sit: Max assist, Total assist, +2 for physical assistance, +2 for safety/equipment, HOB elevated, Used rails     General bed mobility comments: required Max/Total Assist to transition to EOB.  Severe posterior lean.    Transfers Overall transfer level: Needs assistance Equipment used: Rolling walker (2 wheels) Transfers: Sit to/from Stand Sit to Stand: Max assist, +2 physical assistance, +2 safety/equipment, From elevated surface           General transfer comment: required Max Assist + 2 side by side to  partially stand.  B hips and knees flexed.  Pt was only able to take a few side steps to [facility].  VERY weak.    Ambulation/Gait                   Stairs             Wheelchair Mobility     Tilt Bed    Modified Rankin (Stroke Patients Only)       Balance                                            Cognition Arousal: Alert Behavior During Therapy: WFL for tasks assessed/performed Overall Cognitive Status: No family/caregiver present to determine baseline cognitive functioning                                 General Comments: patient is able to follow commands        Exercises      General Comments        Pertinent Vitals/Pain Pain Assessment Pain Assessment: No/denies pain    Home Living                          Prior Function  PT Goals (current goals can now be found in the care plan section) Progress towards PT goals: Progressing toward goals    Frequency    Min 1X/week      PT Plan      Co-evaluation              AM-PAC PT "6 Clicks" Mobility   Outcome Measure  Help needed turning from your back to your side while in a flat bed without using bedrails?: A Lot Help needed moving from lying on your back to sitting on the side of a flat bed without using bedrails?: A Lot Help needed moving to and from a bed to a chair (including a wheelchair)?: A Lot Help needed standing up from a chair using your arms (e.g., wheelchair or bedside chair)?: A Lot Help needed to walk in hospital room?: Total Help needed climbing 3-5 steps with a railing? : Total 6 Click Score: 10    End of Session Equipment Utilized During Treatment: Gait belt Activity Tolerance: Patient tolerated treatment well Patient left: in bed;with call bell/phone within reach;with bed alarm set Nurse Communication: Mobility status PT Visit Diagnosis: Muscle weakness (generalized) (M62.81);History of falling  (Z91.81);Difficulty in walking, not elsewhere classified (R26.2);Unsteadiness on feet (R26.81)     Time: 0272-5366 PT Time Calculation (min) (ACUTE ONLY): 28 min  Charges:    $Therapeutic Activity: 23-37 mins PT General Charges $$ ACUTE PT VISIT: 1 Visit                     Felecia Shelling  PTA Acute  Rehabilitation Services Office M-F          [phone]

## 2023-03-21 NOTE — Progress Notes (Signed)
WBC 5.7 5.1  HGB 11.4* 10.1*  HCT 34.8* 29.7*  MCV 100.0 97.4  PLT 144* 142*   Basic Metabolic Panel: Recent Labs  Lab 03/18/23 1251 03/18/23 1300 03/18/23 2341 03/19/23 0512 03/20/23 0617 03/21/23 0836  NA 141  --   --  142 137 136  K 2.4*  --   --  3.1* 3.5 3.2*  CL 98  --   --  103 97* 95*  CO2 26  --   --  29 28 29   GLUCOSE 86  --   --  91 102* 111*  BUN 20  --   --  17 18 22   CREATININE 1.72*  --   --  1.37* 1.36* 1.47*  CALCIUM 8.6*  --   --  8.7* 9.2 9.1  MG  --  1.6* 1.3* 1.2* 1.6* 1.6*  PHOS  --   --   --   --  1.7* 2.6   GFR: Estimated Creatinine Clearance: 47.9 mL/min (A) (by C-G formula based on SCr of 1.47 mg/dL (H)). Liver Function Tests: Recent Labs  Lab 03/18/23 1251  AST 110*  ALT 69*   ALKPHOS 107  BILITOT 1.0  PROT 6.7  ALBUMIN 3.3*   No results for input(s): "LIPASE", "AMYLASE" in the last 168 hours. No results for input(s): "AMMONIA" in the last 168 hours. Coagulation Profile: No results for input(s): "INR", "PROTIME" in the last 168 hours. Cardiac Enzymes: No results for input(s): "CKTOTAL", "CKMB", "CKMBINDEX", "TROPONINI" in the last 168 hours. BNP (last 3 results) No results for input(s): "PROBNP" in the last 8760 hours. HbA1C: No results for input(s): "HGBA1C" in the last 72 hours. CBG: No results for input(s): "GLUCAP" in the last 168 hours. Lipid Profile: No results for input(s): "CHOL", "HDL", "LDLCALC", "TRIG", "CHOLHDL", "LDLDIRECT" in the last 72 hours. Thyroid Function Tests: Recent Labs    03/19/23 0512  TSH 1.852   Anemia Panel: No results for input(s): "VITAMINB12", "FOLATE", "FERRITIN", "TIBC", "IRON", "RETICCTPCT" in the last 72 hours. Sepsis Labs: No results for input(s): "PROCALCITON", "LATICACIDVEN" in the last 168 hours.  No results found for this or any previous visit (from the past 240 hour(s)).   Radiology Studies: DG CHEST PORT 1 VIEW  Result Date: 03/21/2023 CLINICAL DATA:  [id number] Aspiration pneumonia (HCC) [id number]. EXAM: PORTABLE CHEST 1 VIEW COMPARISON:  Chest radiograph 03/18/2023. FINDINGS: Right chest 2 lead AICD with both leads projecting over the right ventricle. Clear lungs. Stable cardiac and mediastinal contours. No pleural effusion or pneumothorax. Visualized bones and upper abdomen are unremarkable. IMPRESSION: No evidence of acute cardiopulmonary disease. Electronically Signed   By: Orvan Falconer M.D.   On: 03/21/2023 12:45   DG Swallowing Func-Speech Pathology  Result Date: 03/21/2023 Table formatting from the original result was not included. Modified Barium Swallow Study Patient Details Name: [name] Phillips MRN: [id number] Date of Birth: 1955/07/23 Today's Date: 03/21/2023 HPI/PMH: HPI: 67 yo male adm to [facility] on  10/23 with weakness, patient reported fall at home - Has h/o COPD, ETOH use, malnutrition, tobacco use, CVA - chronic lacunar bilateral basal ganglia CVAs, 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  Swallow evaluation ordered. Clinical Impression: Clinical Impression: Patient presents with mild oral (likely neuro based) and mild likley neuro based pharyngeal and moderate obstructive pharyngeal dysphagia (from cervical osteophytes).  Above deficits result in decreased oral coordination/transiting of boluses -  PROGRESS NOTE    [name] Phillips  ZOX:[id number] DOB: 1955-09-23 DOA: 03/18/2023 PCP: Hillery Aldo, NP   Brief Narrative:  HPI:  Mr. Steurer is a 67 y.o. 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   Evidently, APS came to the home to check on patient today, found him unable to walk or care for himself due to leg swelling, intoxicated.   In the ER, CXR showed cardiomegaly.  He had marked LE edema.  Potassium was 2.4 and he was hypothermic rectal temp 95.  Aching all over, but no focal pain complaints to me.   Placed on warming blanket, given supplemental K and hospitalist service asked to evaluate.    Assessment & Plan:   Principal Problem:   Alcohol intoxication (HCC) Active Problems:   Hypothermia   Leg swelling and history of nonischemic cardiomyopathy   Hypokalemia   HTN (hypertension)   History of CVA (cerebrovascular accident)   AICD (automatic cardioverter/defibrillator) present   Macrocytic anemia   Protein-calorie malnutrition, severe (HCC)   Malnutrition of moderate degree  Alcohol intoxication (HCC) Alcohol dependence Presents with BAL 245 mg/dL. Says his sisters provide it for him.  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   Does not desire to quit, tells his wife "I've been drinking 70 years and I don't want to stop".  Has been started on Librium tapering protocol and he is not having any withdrawal symptoms.  No tremors.  CIWA below 5.  Continue folate and thiamine.  Hypothermia Probably from alcohol intoxication, similar to 12/2013 admission.  Resolved.  TSH normal.   Leg swelling and history of nonischemic cardiomyopathy EF 25-30% in 2015, recovered by 2020 to 60-65%.  Doesn't follow with  Cardiology.   Now with 2-3 months progressive leg swelling.  CXR shows cardiomegaly.  Probably nonadherent to diuretics (and unclear from med list if he is on furosemide or torsemide).  Not hypoxic.  BNP within normal limit.  Echo shows reduced/45% ejection fraction however EF was 65% 4 years ago.  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  He is asymptomatic.  Not a candidate for any intervention due to history of noncompliance/nonadherence.  If desired, outpatient cardiology consultation can be obtained.   Hypokalemia: Resolved.  Hypomagnesemia/hypophosphatemia: Normal potassium but still with low magnesium, will replenish again.   Protein-calorie malnutrition, severe (HCC) As evidenced by severe loss of subcutaneous muscle mass and fat - Consulted dietitian   Macrocytic anemia Due to alcohol.  No clinically observed bleeding or reports of bleeding.  Hemoglobin stable.   AICD (automatic cardioverter/defibrillator) present   History of CVA (cerebrovascular accident) - Continue Plavix, statin   HTN (hypertension) Blood pressure now controlled.  Continue home dose of diuretics and Coreg as well as ramipril.   Transaminitis No abdominal complaints.  From alcohol, no further work up needed.   Cognitive impairment Unclear if this is mostly intoxication or permanent cognitive impairment.  APS are involved in the case and he is significantly debilitated, and wife is asking for placement in long term care.  At present, the patient is drinking and does not desire to stop.  TOC, PT OT consulted.  PT OT recommended SNF.  TOC working on placement and with the  PROGRESS NOTE    [name] Phillips  ZOX:[id number] DOB: 1955-09-23 DOA: 03/18/2023 PCP: Hillery Aldo, NP   Brief Narrative:  HPI:  Mr. Steurer is a 67 y.o. 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   Evidently, APS came to the home to check on patient today, found him unable to walk or care for himself due to leg swelling, intoxicated.   In the ER, CXR showed cardiomegaly.  He had marked LE edema.  Potassium was 2.4 and he was hypothermic rectal temp 95.  Aching all over, but no focal pain complaints to me.   Placed on warming blanket, given supplemental K and hospitalist service asked to evaluate.    Assessment & Plan:   Principal Problem:   Alcohol intoxication (HCC) Active Problems:   Hypothermia   Leg swelling and history of nonischemic cardiomyopathy   Hypokalemia   HTN (hypertension)   History of CVA (cerebrovascular accident)   AICD (automatic cardioverter/defibrillator) present   Macrocytic anemia   Protein-calorie malnutrition, severe (HCC)   Malnutrition of moderate degree  Alcohol intoxication (HCC) Alcohol dependence Presents with BAL 245 mg/dL. Says his sisters provide it for him.  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   Does not desire to quit, tells his wife "I've been drinking 70 years and I don't want to stop".  Has been started on Librium tapering protocol and he is not having any withdrawal symptoms.  No tremors.  CIWA below 5.  Continue folate and thiamine.  Hypothermia Probably from alcohol intoxication, similar to 12/2013 admission.  Resolved.  TSH normal.   Leg swelling and history of nonischemic cardiomyopathy EF 25-30% in 2015, recovered by 2020 to 60-65%.  Doesn't follow with  Cardiology.   Now with 2-3 months progressive leg swelling.  CXR shows cardiomegaly.  Probably nonadherent to diuretics (and unclear from med list if he is on furosemide or torsemide).  Not hypoxic.  BNP within normal limit.  Echo shows reduced/45% ejection fraction however EF was 65% 4 years ago.  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  He is asymptomatic.  Not a candidate for any intervention due to history of noncompliance/nonadherence.  If desired, outpatient cardiology consultation can be obtained.   Hypokalemia: Resolved.  Hypomagnesemia/hypophosphatemia: Normal potassium but still with low magnesium, will replenish again.   Protein-calorie malnutrition, severe (HCC) As evidenced by severe loss of subcutaneous muscle mass and fat - Consulted dietitian   Macrocytic anemia Due to alcohol.  No clinically observed bleeding or reports of bleeding.  Hemoglobin stable.   AICD (automatic cardioverter/defibrillator) present   History of CVA (cerebrovascular accident) - Continue Plavix, statin   HTN (hypertension) Blood pressure now controlled.  Continue home dose of diuretics and Coreg as well as ramipril.   Transaminitis No abdominal complaints.  From alcohol, no further work up needed.   Cognitive impairment Unclear if this is mostly intoxication or permanent cognitive impairment.  APS are involved in the case and he is significantly debilitated, and wife is asking for placement in long term care.  At present, the patient is drinking and does not desire to stop.  TOC, PT OT consulted.  PT OT recommended SNF.  TOC working on placement and with the  PROGRESS NOTE    [name] Phillips  ZOX:[id number] DOB: 1955-09-23 DOA: 03/18/2023 PCP: Hillery Aldo, NP   Brief Narrative:  HPI:  Mr. Steurer is a 67 y.o. 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   Evidently, APS came to the home to check on patient today, found him unable to walk or care for himself due to leg swelling, intoxicated.   In the ER, CXR showed cardiomegaly.  He had marked LE edema.  Potassium was 2.4 and he was hypothermic rectal temp 95.  Aching all over, but no focal pain complaints to me.   Placed on warming blanket, given supplemental K and hospitalist service asked to evaluate.    Assessment & Plan:   Principal Problem:   Alcohol intoxication (HCC) Active Problems:   Hypothermia   Leg swelling and history of nonischemic cardiomyopathy   Hypokalemia   HTN (hypertension)   History of CVA (cerebrovascular accident)   AICD (automatic cardioverter/defibrillator) present   Macrocytic anemia   Protein-calorie malnutrition, severe (HCC)   Malnutrition of moderate degree  Alcohol intoxication (HCC) Alcohol dependence Presents with BAL 245 mg/dL. Says his sisters provide it for him.  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   Does not desire to quit, tells his wife "I've been drinking 70 years and I don't want to stop".  Has been started on Librium tapering protocol and he is not having any withdrawal symptoms.  No tremors.  CIWA below 5.  Continue folate and thiamine.  Hypothermia Probably from alcohol intoxication, similar to 12/2013 admission.  Resolved.  TSH normal.   Leg swelling and history of nonischemic cardiomyopathy EF 25-30% in 2015, recovered by 2020 to 60-65%.  Doesn't follow with  Cardiology.   Now with 2-3 months progressive leg swelling.  CXR shows cardiomegaly.  Probably nonadherent to diuretics (and unclear from med list if he is on furosemide or torsemide).  Not hypoxic.  BNP within normal limit.  Echo shows reduced/45% ejection fraction however EF was 65% 4 years ago.  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  He is asymptomatic.  Not a candidate for any intervention due to history of noncompliance/nonadherence.  If desired, outpatient cardiology consultation can be obtained.   Hypokalemia: Resolved.  Hypomagnesemia/hypophosphatemia: Normal potassium but still with low magnesium, will replenish again.   Protein-calorie malnutrition, severe (HCC) As evidenced by severe loss of subcutaneous muscle mass and fat - Consulted dietitian   Macrocytic anemia Due to alcohol.  No clinically observed bleeding or reports of bleeding.  Hemoglobin stable.   AICD (automatic cardioverter/defibrillator) present   History of CVA (cerebrovascular accident) - Continue Plavix, statin   HTN (hypertension) Blood pressure now controlled.  Continue home dose of diuretics and Coreg as well as ramipril.   Transaminitis No abdominal complaints.  From alcohol, no further work up needed.   Cognitive impairment Unclear if this is mostly intoxication or permanent cognitive impairment.  APS are involved in the case and he is significantly debilitated, and wife is asking for placement in long term care.  At present, the patient is drinking and does not desire to stop.  TOC, PT OT consulted.  PT OT recommended SNF.  TOC working on placement and with the  PROGRESS NOTE    [name] Phillips  ZOX:[id number] DOB: 1955-09-23 DOA: 03/18/2023 PCP: Hillery Aldo, NP   Brief Narrative:  HPI:  Mr. Steurer is a 67 y.o. 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   Evidently, APS came to the home to check on patient today, found him unable to walk or care for himself due to leg swelling, intoxicated.   In the ER, CXR showed cardiomegaly.  He had marked LE edema.  Potassium was 2.4 and he was hypothermic rectal temp 95.  Aching all over, but no focal pain complaints to me.   Placed on warming blanket, given supplemental K and hospitalist service asked to evaluate.    Assessment & Plan:   Principal Problem:   Alcohol intoxication (HCC) Active Problems:   Hypothermia   Leg swelling and history of nonischemic cardiomyopathy   Hypokalemia   HTN (hypertension)   History of CVA (cerebrovascular accident)   AICD (automatic cardioverter/defibrillator) present   Macrocytic anemia   Protein-calorie malnutrition, severe (HCC)   Malnutrition of moderate degree  Alcohol intoxication (HCC) Alcohol dependence Presents with BAL 245 mg/dL. Says his sisters provide it for him.  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   Does not desire to quit, tells his wife "I've been drinking 70 years and I don't want to stop".  Has been started on Librium tapering protocol and he is not having any withdrawal symptoms.  No tremors.  CIWA below 5.  Continue folate and thiamine.  Hypothermia Probably from alcohol intoxication, similar to 12/2013 admission.  Resolved.  TSH normal.   Leg swelling and history of nonischemic cardiomyopathy EF 25-30% in 2015, recovered by 2020 to 60-65%.  Doesn't follow with  Cardiology.   Now with 2-3 months progressive leg swelling.  CXR shows cardiomegaly.  Probably nonadherent to diuretics (and unclear from med list if he is on furosemide or torsemide).  Not hypoxic.  BNP within normal limit.  Echo shows reduced/45% ejection fraction however EF was 65% 4 years ago.  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  He is asymptomatic.  Not a candidate for any intervention due to history of noncompliance/nonadherence.  If desired, outpatient cardiology consultation can be obtained.   Hypokalemia: Resolved.  Hypomagnesemia/hypophosphatemia: Normal potassium but still with low magnesium, will replenish again.   Protein-calorie malnutrition, severe (HCC) As evidenced by severe loss of subcutaneous muscle mass and fat - Consulted dietitian   Macrocytic anemia Due to alcohol.  No clinically observed bleeding or reports of bleeding.  Hemoglobin stable.   AICD (automatic cardioverter/defibrillator) present   History of CVA (cerebrovascular accident) - Continue Plavix, statin   HTN (hypertension) Blood pressure now controlled.  Continue home dose of diuretics and Coreg as well as ramipril.   Transaminitis No abdominal complaints.  From alcohol, no further work up needed.   Cognitive impairment Unclear if this is mostly intoxication or permanent cognitive impairment.  APS are involved in the case and he is significantly debilitated, and wife is asking for placement in long term care.  At present, the patient is drinking and does not desire to stop.  TOC, PT OT consulted.  PT OT recommended SNF.  TOC working on placement and with the  WBC 5.7 5.1  HGB 11.4* 10.1*  HCT 34.8* 29.7*  MCV 100.0 97.4  PLT 144* 142*   Basic Metabolic Panel: Recent Labs  Lab 03/18/23 1251 03/18/23 1300 03/18/23 2341 03/19/23 0512 03/20/23 0617 03/21/23 0836  NA 141  --   --  142 137 136  K 2.4*  --   --  3.1* 3.5 3.2*  CL 98  --   --  103 97* 95*  CO2 26  --   --  29 28 29   GLUCOSE 86  --   --  91 102* 111*  BUN 20  --   --  17 18 22   CREATININE 1.72*  --   --  1.37* 1.36* 1.47*  CALCIUM 8.6*  --   --  8.7* 9.2 9.1  MG  --  1.6* 1.3* 1.2* 1.6* 1.6*  PHOS  --   --   --   --  1.7* 2.6   GFR: Estimated Creatinine Clearance: 47.9 mL/min (A) (by C-G formula based on SCr of 1.47 mg/dL (H)). Liver Function Tests: Recent Labs  Lab 03/18/23 1251  AST 110*  ALT 69*   ALKPHOS 107  BILITOT 1.0  PROT 6.7  ALBUMIN 3.3*   No results for input(s): "LIPASE", "AMYLASE" in the last 168 hours. No results for input(s): "AMMONIA" in the last 168 hours. Coagulation Profile: No results for input(s): "INR", "PROTIME" in the last 168 hours. Cardiac Enzymes: No results for input(s): "CKTOTAL", "CKMB", "CKMBINDEX", "TROPONINI" in the last 168 hours. BNP (last 3 results) No results for input(s): "PROBNP" in the last 8760 hours. HbA1C: No results for input(s): "HGBA1C" in the last 72 hours. CBG: No results for input(s): "GLUCAP" in the last 168 hours. Lipid Profile: No results for input(s): "CHOL", "HDL", "LDLCALC", "TRIG", "CHOLHDL", "LDLDIRECT" in the last 72 hours. Thyroid Function Tests: Recent Labs    03/19/23 0512  TSH 1.852   Anemia Panel: No results for input(s): "VITAMINB12", "FOLATE", "FERRITIN", "TIBC", "IRON", "RETICCTPCT" in the last 72 hours. Sepsis Labs: No results for input(s): "PROCALCITON", "LATICACIDVEN" in the last 168 hours.  No results found for this or any previous visit (from the past 240 hour(s)).   Radiology Studies: DG CHEST PORT 1 VIEW  Result Date: 03/21/2023 CLINICAL DATA:  [id number] Aspiration pneumonia (HCC) [id number]. EXAM: PORTABLE CHEST 1 VIEW COMPARISON:  Chest radiograph 03/18/2023. FINDINGS: Right chest 2 lead AICD with both leads projecting over the right ventricle. Clear lungs. Stable cardiac and mediastinal contours. No pleural effusion or pneumothorax. Visualized bones and upper abdomen are unremarkable. IMPRESSION: No evidence of acute cardiopulmonary disease. Electronically Signed   By: Orvan Falconer M.D.   On: 03/21/2023 12:45   DG Swallowing Func-Speech Pathology  Result Date: 03/21/2023 Table formatting from the original result was not included. Modified Barium Swallow Study Patient Details Name: [name] Phillips MRN: [id number] Date of Birth: 1955/07/23 Today's Date: 03/21/2023 HPI/PMH: HPI: 67 yo male adm to [facility] on  10/23 with weakness, patient reported fall at home - Has h/o COPD, ETOH use, malnutrition, tobacco use, CVA - chronic lacunar bilateral basal ganglia CVAs, 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  Swallow evaluation ordered. Clinical Impression: Clinical Impression: Patient presents with mild oral (likely neuro based) and mild likley neuro based pharyngeal and moderate obstructive pharyngeal dysphagia (from cervical osteophytes).  Above deficits result in decreased oral coordination/transiting of boluses -

## 2023-03-21 NOTE — Evaluation (Signed)
Modified Barium Swallow Study  Patient Details  Name: [name] Phillips MRN: [id number] Date of Birth: 05/13/1956  Today's Date: 03/21/2023  Modified Barium Swallow completed.  Full report located under Chart Review in the Imaging Section.  History of Present Illness 67 yo male adm to [facility] on 10/23 with weakness, patient reported fall at home - Has h/o COPD, ETOH use, malnutrition, tobacco use, CVA - chronic lacunar bilateral basal ganglia CVAs, 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  Swallow evaluation ordered.   Clinical Impression Patient presents with mild oral (likely neuro based) and mild likley neuro based pharyngeal and moderate obstructive pharyngeal dysphagia (from cervical osteophytes).  Above deficits result in decreased oral coordination/transiting of boluses - more so with thicker consistencies.  Pharyngeal swallow marked by impaired epiglottic deflection as it contacts posterior pharyngeal wall decreasing airway closure and trapping boluses in pharynx.  No aspiration noted but penetration of liquids present- mostly with sequential swallows of thin liquids. Pt uses compensaton strategy of extended breath hold with liquid swallows -indicative of chronicity of his dysphagia.  Did not test tablet due to concern for retention.  Head turn may have improved airway protection but was difficult to definitively state. Head turn left did not decrease retention. .  Cued dry swallows and liquid swallows decrease retention. Recommend continue diet with precautions and follow up at SNF with SLP for dysphagia management - to maximize swallow safety with pharyngeal swallow exercises. Factors that may increase risk of adverse event in presence of aspiration Rubye Oaks [employer] Clearance Coots 2021): Poor general health and/or compromised immunity;Weak cough  Swallow Evaluation  Recommendations Recommendations: PO diet PO Diet Recommendation: Regular;Thin liquids (Level 0) Liquid Administration via: Cup;Straw Medication Administration: Other (Comment) Supervision: Patient able to self-feed;Full supervision/cueing for swallowing strategies Swallowing strategies  : Slow rate Postural changes: Position pt fully upright for meals;Stay upright 30-60 min after meals Oral care recommendations: Oral care BID (2x/day)    Rolena Infante, MS [facility] SLP Acute Rehab Services Office [phone]   Chales Abrahams 03/21/2023,12:36 PM

## 2023-03-22 DIAGNOSIS — F10920 Alcohol use, unspecified with intoxication, uncomplicated: Secondary | ICD-10-CM | POA: Diagnosis not present

## 2023-03-22 LAB — BASIC METABOLIC PANEL
Anion gap: 11 (ref 5–15)
BUN: 26 mg/dL — ABNORMAL HIGH (ref 8–23)
CO2: 28 mmol/L (ref 22–32)
Calcium: 9.3 mg/dL (ref 8.9–10.3)
Chloride: 96 mmol/L — ABNORMAL LOW (ref 98–111)
Creatinine, Ser: 1.44 mg/dL — ABNORMAL HIGH (ref 0.61–1.24)
GFR, Estimated: 53 mL/min — ABNORMAL LOW (ref >60)
Glucose, Bld: 120 mg/dL — ABNORMAL HIGH (ref 70–99)
Potassium: 3.5 mmol/L (ref 3.5–5.1)
Sodium: 135 mmol/L (ref 135–145)

## 2023-03-22 MED ORDER — POTASSIUM CHLORIDE CRYS ER 20 MEQ PO TBCR
40.0000 meq | EXTENDED_RELEASE_TABLET | Freq: Once | ORAL | Status: AC
  Administered 2023-03-22: 40 meq via ORAL
  Filled 2023-03-22: qty 2

## 2023-03-22 MED ORDER — CARVEDILOL 3.125 MG PO TABS
3.1250 mg | ORAL_TABLET | Freq: Two times a day (BID) | ORAL | 0 refills | Status: AC
Start: 2023-03-22 — End: 2024-03-21

## 2023-03-22 NOTE — Plan of Care (Signed)
  Problem: Education: Goal: Knowledge of General Education information will improve Description: Including pain rating scale, medication(s)/side effects and non-pharmacologic comfort measures Outcome: Adequate for Discharge   Problem: Health Behavior/Discharge Planning: Goal: Ability to manage health-related needs will improve Outcome: Adequate for Discharge   Problem: Clinical Measurements: Goal: Ability to maintain clinical measurements within normal limits will improve Outcome: Adequate for Discharge Goal: Will remain free from infection Outcome: Adequate for Discharge Goal: Diagnostic test results will improve Outcome: Adequate for Discharge Goal: Respiratory complications will improve Outcome: Adequate for Discharge Goal: Cardiovascular complication will be avoided Outcome: Adequate for Discharge   Problem: Activity: Goal: Risk for activity intolerance will decrease Outcome: Adequate for Discharge   Problem: Nutrition: Goal: Adequate nutrition will be maintained Outcome: Adequate for Discharge   Problem: Coping: Goal: Level of anxiety will decrease Outcome: Adequate for Discharge   Problem: Elimination: Goal: Will not experience complications related to bowel motility Outcome: Adequate for Discharge Goal: Will not experience complications related to urinary retention Outcome: Adequate for Discharge   Problem: Pain Management: Goal: General experience of comfort will improve Outcome: Adequate for Discharge   Problem: Safety: Goal: Ability to remain free from injury will improve Outcome: Adequate for Discharge   Problem: Skin Integrity: Goal: Risk for impaired skin integrity will decrease Outcome: Adequate for Discharge   Problem: Health Behavior/Discharge Planning: Goal: Ability to identify changes in lifestyle to reduce recurrence of condition will improve Outcome: Adequate for Discharge Goal: Identification of resources available to assist in meeting health  care needs will improve Outcome: Adequate for Discharge   Problem: Physical Regulation: Goal: Complications related to the disease process, condition or treatment will be avoided or minimized Outcome: Adequate for Discharge   Problem: Safety: Goal: Ability to remain free from injury will improve Outcome: Adequate for Discharge   Problem: Education: Goal: Ability to demonstrate management of disease process will improve Outcome: Adequate for Discharge Goal: Ability to verbalize understanding of medication therapies will improve Outcome: Adequate for Discharge Goal: Individualized Educational Video(s) Outcome: Adequate for Discharge   Problem: Activity: Goal: Capacity to carry out activities will improve Outcome: Adequate for Discharge   Problem: Cardiac: Goal: Ability to achieve and maintain adequate cardiopulmonary perfusion will improve Outcome: Adequate for Discharge

## 2023-03-22 NOTE — TOC Transition Note (Addendum)
Transition of Care [facility]) - CM/SW Discharge Note   Patient Details  Name: [name] Phillips MRN: [id number] Date of Birth: [birth year]  [facility] CM/SW Contact:  Adrian Prows, RN Phone Number: 03/22/2023, 1:26 PM   Clinical Narrative:    D/C orders received; spoke w/ Bjorn Loser at [facility]; she gave RM # [phone], call report # [phone]; pt transport by [facility]; D/C summary and SNF transfer report sent via SNF hub; attempted to notify pt's wife Ewan Cressey; LVM at [phone]; [facility] called at [phone]; spoke w/ operator # [phone]; he does not have an ETA; no TOC needs.  -[id number]- pt's wife returned call; she was notified that pt will d/c to [facility]; she agrees to d/c plan. Final next level of care: Skilled Nursing Facility Barriers to Discharge: No Barriers Identified   Patient Goals and CMS Choice      Discharge Placement                Patient chooses bed at: [facility] Patient to be transferred to facility by: [facility] Name of family member notified: LVM for [facility] Sarria (sp) [phone] Patient and family notified of of transfer: 03/22/23  Discharge Plan and Services Additional resources added to the After Visit Summary for                                       Social Determinants of Health (SDOH) Interventions SDOH Screenings   Food Insecurity: No Food Insecurity (03/18/2023)  Housing: Medium Risk (03/18/2023)  Transportation Needs: Unmet Transportation Needs (03/18/2023)  Utilities: Not At Risk (03/18/2023)  Depression (PHQ2-9): Medium Risk (01/13/2019)  Tobacco Use: High Risk (03/18/2023)     Readmission Risk Interventions     No data to display

## 2023-03-22 NOTE — Discharge Summary (Signed)
Birth:  [birth year]       BSA:          2.108 m Patient Age:    67 years       BP:           176/102 mmHg Patient Gender: M              HR:           48 bpm. Exam Location:  Inpatient Procedure: 2D Echo, Cardiac Doppler and Color Doppler Indications:    CHF-Acute Diastolic I50.31  History:        Patient has prior history of Echocardiogram examinations, most                 recent 01/22/2019. CHF and Cardiomyopathy, Stroke; Risk                 Factors:Hypertension and Current Smoker.  Sonographer:    Lucendia Herrlich RCS Referring Phys: [id number]  CHRISTOPHER P DANFORD IMPRESSIONS  1. Left ventricular ejection fraction, by estimation, is 45%. The left ventricle has mildly decreased function. The left ventricle demonstrates global hypokinesis. There is moderate concentric left ventricular hypertrophy. Left ventricular diastolic parameters are consistent with Grade I diastolic dysfunction (impaired relaxation).  2. Right ventricular systolic function is normal. The right ventricular size is normal. There is normal pulmonary artery systolic pressure. The estimated right ventricular systolic pressure is 16.7 mmHg.  3. The mitral valve is normal in structure. Trivial mitral valve regurgitation. No evidence of mitral stenosis.  4. The aortic valve is tricuspid. There is mild calcification of the aortic valve. Aortic valve regurgitation is not visualized. No aortic stenosis is present.  5. The inferior vena cava is normal in size with greater than 50% respiratory variability, suggesting right atrial pressure of 3 mmHg. FINDINGS  Left Ventricle: Left ventricular ejection fraction, by estimation, is 45%. The left ventricle has mildly decreased function. The left ventricle demonstrates global hypokinesis. The left ventricular internal cavity size was normal in size. There is moderate concentric left ventricular hypertrophy. Left ventricular diastolic parameters are consistent with Grade I diastolic dysfunction (impaired relaxation). Right Ventricle: The right ventricular size is normal. No increase in right ventricular wall thickness. Right ventricular systolic function is normal. There is normal pulmonary artery systolic pressure. The tricuspid regurgitant velocity is 1.85 m/s, and  with an assumed right atrial pressure of 3 mmHg, the estimated right ventricular systolic pressure is 16.7 mmHg. Left Atrium: Left atrial size was normal in size. Right Atrium: Right atrial size was normal in size. Pericardium: Trivial pericardial effusion is present. Mitral Valve: The  mitral valve is normal in structure. Trivial mitral valve regurgitation. No evidence of mitral valve stenosis. Tricuspid Valve: The tricuspid valve is normal in structure. Tricuspid valve regurgitation is mild. Aortic Valve: The aortic valve is tricuspid. There is mild calcification of the aortic valve. Aortic valve regurgitation is not visualized. No aortic stenosis is present. Aortic valve peak gradient measures 4.1 mmHg. Pulmonic Valve: The pulmonic valve was normal in structure. Pulmonic valve regurgitation is not visualized. Aorta: The aortic root is normal in size and structure. Venous: The inferior vena cava is normal in size with greater than 50% respiratory variability, suggesting right atrial pressure of 3 mmHg. IAS/Shunts: No atrial level shunt detected by color flow Doppler. Additional Comments: A device lead is visualized in the right ventricle.  LEFT VENTRICLE PLAX 2D LVIDd:         4.65 cm  Physician Discharge Summary  DEANNA HANISH ZOX:[id number] DOB: 1955-09-23 DOA: 03/18/2023  PCP: Hillery Aldo, NP  Admit date: 03/18/2023 Discharge date: 03/22/2023    Admitted From: Home Disposition: SNF  Recommendations for Outpatient Follow-up:  Follow up with PCP in 1-2 weeks Please obtain BMP/CBC in one week Please follow up with your PCP on the following pending results: Unresulted Labs (From admission, onward)     Start     Ordered   03/25/23 0500  Creatinine, serum  (enoxaparin (LOVENOX)    CrCl >/= 30 ml/min)  Weekly,   R     Comments: while on enoxaparin therapy    03/18/23 2200              Home Health: None  Equipment/Devices: None  Discharge Condition: Stable but poor prognosis and long run CODE STATUS: DNR Diet recommendation: Cardiac  Subjective: Seen and examined.  Complains of neck pain and back pain due to position.  Otherwise has no complaints.  Brief/Interim Summary: Mr. Schuhmacher is a 67 y.o. 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   Evidently, APS came to the home to check on patient on day of admission, found him unable to walk or care for himself due to leg swelling, intoxicated.   In the ER, CXR showed cardiomegaly.  He had marked LE edema.  Potassium was 2.4 and he was hypothermic rectal temp 95.  Aching all over, but no focal pain complaints to me.Placed on warming blanket, given supplemental K and admitted under hospital service.  Details below.   Alcohol intoxication (HCC) Alcohol dependence Presents with BAL 245 mg/dL. Says his sisters provide it for him.  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   Does not desire to quit, tells his wife "I've been drinking 70 years  and I don't want to stop".  He was treated with Librium tapering protocol.  Did not have any withdrawal at all.   Hypothermia Probably from alcohol intoxication, similar to 12/2013 admission.  Resolved.  TSH normal.   Leg swelling and history of nonischemic cardiomyopathy EF 25-30% in 2015, recovered by 2020 to 60-65% echo this admission showed reduced ejection fraction of 45%, likely due to chronic alcoholism, nonischemic.  Doesn't follow with Cardiology.  Now with 2-3 months progressive leg swelling.  CXR shows cardiomegaly.  Probably nonadherent to diuretics (and unclear from med list if he is on furosemide or torsemide).  Not hypoxic.  BNP within normal limit.  He is asymptomatic.  Not a candidate for any intervention due to history of noncompliance/nonadherence.  If desired, outpatient cardiology consultation can be obtained.  Will defer to PCP.  Discharging on Plavix, torsemide, Aldactone, Coreg and ramipril.  Highly encouraged to remain compliant to medications and follow-up with cardiology as outpatient.   Hypokalemia: Resolved.   Hypomagnesemia/hypophosphatemia: Replenished    Protein-calorie malnutrition, severe (HCC) As evidenced by severe loss of subcutaneous muscle mass and fat - Consulted dietitian.  Should be supplemented with protein shakes.   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  No clinically observed bleeding or reports of bleeding.  Hemoglobin stable.   AICD (automatic cardioverter/defibrillator) present   History of CVA (cerebrovascular accident) - Continue Plavix, statin   HTN (hypertension) Blood pressure slightly on the low side.  Physician Discharge Summary  DEANNA HANISH ZOX:[id number] DOB: 1955-09-23 DOA: 03/18/2023  PCP: Hillery Aldo, NP  Admit date: 03/18/2023 Discharge date: 03/22/2023    Admitted From: Home Disposition: SNF  Recommendations for Outpatient Follow-up:  Follow up with PCP in 1-2 weeks Please obtain BMP/CBC in one week Please follow up with your PCP on the following pending results: Unresulted Labs (From admission, onward)     Start     Ordered   03/25/23 0500  Creatinine, serum  (enoxaparin (LOVENOX)    CrCl >/= 30 ml/min)  Weekly,   R     Comments: while on enoxaparin therapy    03/18/23 2200              Home Health: None  Equipment/Devices: None  Discharge Condition: Stable but poor prognosis and long run CODE STATUS: DNR Diet recommendation: Cardiac  Subjective: Seen and examined.  Complains of neck pain and back pain due to position.  Otherwise has no complaints.  Brief/Interim Summary: Mr. Schuhmacher is a 67 y.o. 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   Evidently, APS came to the home to check on patient on day of admission, found him unable to walk or care for himself due to leg swelling, intoxicated.   In the ER, CXR showed cardiomegaly.  He had marked LE edema.  Potassium was 2.4 and he was hypothermic rectal temp 95.  Aching all over, but no focal pain complaints to me.Placed on warming blanket, given supplemental K and admitted under hospital service.  Details below.   Alcohol intoxication (HCC) Alcohol dependence Presents with BAL 245 mg/dL. Says his sisters provide it for him.  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   Does not desire to quit, tells his wife "I've been drinking 70 years  and I don't want to stop".  He was treated with Librium tapering protocol.  Did not have any withdrawal at all.   Hypothermia Probably from alcohol intoxication, similar to 12/2013 admission.  Resolved.  TSH normal.   Leg swelling and history of nonischemic cardiomyopathy EF 25-30% in 2015, recovered by 2020 to 60-65% echo this admission showed reduced ejection fraction of 45%, likely due to chronic alcoholism, nonischemic.  Doesn't follow with Cardiology.  Now with 2-3 months progressive leg swelling.  CXR shows cardiomegaly.  Probably nonadherent to diuretics (and unclear from med list if he is on furosemide or torsemide).  Not hypoxic.  BNP within normal limit.  He is asymptomatic.  Not a candidate for any intervention due to history of noncompliance/nonadherence.  If desired, outpatient cardiology consultation can be obtained.  Will defer to PCP.  Discharging on Plavix, torsemide, Aldactone, Coreg and ramipril.  Highly encouraged to remain compliant to medications and follow-up with cardiology as outpatient.   Hypokalemia: Resolved.   Hypomagnesemia/hypophosphatemia: Replenished    Protein-calorie malnutrition, severe (HCC) As evidenced by severe loss of subcutaneous muscle mass and fat - Consulted dietitian.  Should be supplemented with protein shakes.   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  No clinically observed bleeding or reports of bleeding.  Hemoglobin stable.   AICD (automatic cardioverter/defibrillator) present   History of CVA (cerebrovascular accident) - Continue Plavix, statin   HTN (hypertension) Blood pressure slightly on the low side.  Physician Discharge Summary  DEANNA HANISH ZOX:[id number] DOB: 1955-09-23 DOA: 03/18/2023  PCP: Hillery Aldo, NP  Admit date: 03/18/2023 Discharge date: 03/22/2023    Admitted From: Home Disposition: SNF  Recommendations for Outpatient Follow-up:  Follow up with PCP in 1-2 weeks Please obtain BMP/CBC in one week Please follow up with your PCP on the following pending results: Unresulted Labs (From admission, onward)     Start     Ordered   03/25/23 0500  Creatinine, serum  (enoxaparin (LOVENOX)    CrCl >/= 30 ml/min)  Weekly,   R     Comments: while on enoxaparin therapy    03/18/23 2200              Home Health: None  Equipment/Devices: None  Discharge Condition: Stable but poor prognosis and long run CODE STATUS: DNR Diet recommendation: Cardiac  Subjective: Seen and examined.  Complains of neck pain and back pain due to position.  Otherwise has no complaints.  Brief/Interim Summary: Mr. Schuhmacher is a 67 y.o. 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   Evidently, APS came to the home to check on patient on day of admission, found him unable to walk or care for himself due to leg swelling, intoxicated.   In the ER, CXR showed cardiomegaly.  He had marked LE edema.  Potassium was 2.4 and he was hypothermic rectal temp 95.  Aching all over, but no focal pain complaints to me.Placed on warming blanket, given supplemental K and admitted under hospital service.  Details below.   Alcohol intoxication (HCC) Alcohol dependence Presents with BAL 245 mg/dL. Says his sisters provide it for him.  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   Does not desire to quit, tells his wife "I've been drinking 70 years  and I don't want to stop".  He was treated with Librium tapering protocol.  Did not have any withdrawal at all.   Hypothermia Probably from alcohol intoxication, similar to 12/2013 admission.  Resolved.  TSH normal.   Leg swelling and history of nonischemic cardiomyopathy EF 25-30% in 2015, recovered by 2020 to 60-65% echo this admission showed reduced ejection fraction of 45%, likely due to chronic alcoholism, nonischemic.  Doesn't follow with Cardiology.  Now with 2-3 months progressive leg swelling.  CXR shows cardiomegaly.  Probably nonadherent to diuretics (and unclear from med list if he is on furosemide or torsemide).  Not hypoxic.  BNP within normal limit.  He is asymptomatic.  Not a candidate for any intervention due to history of noncompliance/nonadherence.  If desired, outpatient cardiology consultation can be obtained.  Will defer to PCP.  Discharging on Plavix, torsemide, Aldactone, Coreg and ramipril.  Highly encouraged to remain compliant to medications and follow-up with cardiology as outpatient.   Hypokalemia: Resolved.   Hypomagnesemia/hypophosphatemia: Replenished    Protein-calorie malnutrition, severe (HCC) As evidenced by severe loss of subcutaneous muscle mass and fat - Consulted dietitian.  Should be supplemented with protein shakes.   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  No clinically observed bleeding or reports of bleeding.  Hemoglobin stable.   AICD (automatic cardioverter/defibrillator) present   History of CVA (cerebrovascular accident) - Continue Plavix, statin   HTN (hypertension) Blood pressure slightly on the low side.  mouth daily.   gabapentin 100 MG capsule Commonly known as: NEURONTIN Take 100 mg by mouth in the morning.   naltrexone 50 MG tablet Commonly known as: DEPADE TAKE 2 TABLETS BY MOUTH ONCE DAILY *EMERGENCY REFILL* What changed: See the new instructions.   pantoprazole 40 MG tablet Commonly known as: PROTONIX TAKE 1 TABLET TWICE DAILY What changed: when to take this   potassium chloride SA 20 MEQ tablet Commonly known as: KLOR-CON M Take 20 mEq by mouth in the morning.   ramipril 10 MG capsule Commonly known as: ALTACE TAKE 1 CAPSULE TWICE DAILY What changed:  how much to take how to take this when to take this additional instructions   spironolactone 25 MG tablet Commonly known as: ALDACTONE Take 1 tablet (25 mg total) by mouth daily.   tamsulosin 0.4 MG Caps capsule Commonly known as: FLOMAX Take  0.4 mg by mouth daily after breakfast.   thiamine 100 MG tablet Commonly known as: Vitamin B-1 Take 1 tablet (100 mg total) by mouth daily.   torsemide 20 MG tablet Commonly known as: DEMADEX Take 20 mg by mouth in the morning.   Travoprost (BAK Free) 0.004 % Soln ophthalmic solution Commonly known as: TRAVATAN Place 1 drop into both eyes at bedtime.        Follow-up Information     Hillery Aldo, NP Follow up in 1 week(s).   Contact information: [address] [place] [address] [phone]                Allergies  Allergen Reactions   Aspirin Shortness Of Breath and Itching   Codeine Hives   Penicillins Hives and Itching    Has patient had a PCN reaction causing immediate rash, facial/tongue/throat swelling, SOB or lightheadedness with hypotension: unknown Has patient had a PCN reaction causing severe rash involving mucus membranes or skin necrosis: unknown Has patient had a PCN reaction that required hospitalization: unknown Has patient had a PCN reaction occurring within the last 10 years: no If all of the above answers are "NO", then may proceed with Cephalosporin use.    Prednisone Hives    Consultations: None   Procedures/Studies: DG CHEST PORT 1 VIEW  Result Date: 03/21/2023 CLINICAL DATA:  [id number] Aspiration pneumonia (HCC) [id number]. EXAM: PORTABLE CHEST 1 VIEW COMPARISON:  Chest radiograph 03/18/2023. FINDINGS: Right chest 2 lead AICD with both leads projecting over the right ventricle. Clear lungs. Stable cardiac and mediastinal contours. No pleural effusion or pneumothorax. Visualized bones and upper abdomen are unremarkable. IMPRESSION: No evidence of acute cardiopulmonary disease. Electronically Signed   By: Orvan Falconer M.D.   On: 03/21/2023 12:45   DG Swallowing Func-Speech Pathology  Result Date: 03/21/2023 Table formatting from the original result was not included. 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  Swallow evaluation ordered. Clinical Impression: Clinical Impression: Patient presents with mild oral (likely neuro based) and mild likley neuro based pharyngeal and moderate obstructive pharyngeal dysphagia (from cervical osteophytes).  Above deficits result in decreased oral coordination/transiting of boluses - more so with  mouth daily.   gabapentin 100 MG capsule Commonly known as: NEURONTIN Take 100 mg by mouth in the morning.   naltrexone 50 MG tablet Commonly known as: DEPADE TAKE 2 TABLETS BY MOUTH ONCE DAILY *EMERGENCY REFILL* What changed: See the new instructions.   pantoprazole 40 MG tablet Commonly known as: PROTONIX TAKE 1 TABLET TWICE DAILY What changed: when to take this   potassium chloride SA 20 MEQ tablet Commonly known as: KLOR-CON M Take 20 mEq by mouth in the morning.   ramipril 10 MG capsule Commonly known as: ALTACE TAKE 1 CAPSULE TWICE DAILY What changed:  how much to take how to take this when to take this additional instructions   spironolactone 25 MG tablet Commonly known as: ALDACTONE Take 1 tablet (25 mg total) by mouth daily.   tamsulosin 0.4 MG Caps capsule Commonly known as: FLOMAX Take  0.4 mg by mouth daily after breakfast.   thiamine 100 MG tablet Commonly known as: Vitamin B-1 Take 1 tablet (100 mg total) by mouth daily.   torsemide 20 MG tablet Commonly known as: DEMADEX Take 20 mg by mouth in the morning.   Travoprost (BAK Free) 0.004 % Soln ophthalmic solution Commonly known as: TRAVATAN Place 1 drop into both eyes at bedtime.        Follow-up Information     Hillery Aldo, NP Follow up in 1 week(s).   Contact information: [address] [place] [address] [phone]                Allergies  Allergen Reactions   Aspirin Shortness Of Breath and Itching   Codeine Hives   Penicillins Hives and Itching    Has patient had a PCN reaction causing immediate rash, facial/tongue/throat swelling, SOB or lightheadedness with hypotension: unknown Has patient had a PCN reaction causing severe rash involving mucus membranes or skin necrosis: unknown Has patient had a PCN reaction that required hospitalization: unknown Has patient had a PCN reaction occurring within the last 10 years: no If all of the above answers are "NO", then may proceed with Cephalosporin use.    Prednisone Hives    Consultations: None   Procedures/Studies: DG CHEST PORT 1 VIEW  Result Date: 03/21/2023 CLINICAL DATA:  [id number] Aspiration pneumonia (HCC) [id number]. EXAM: PORTABLE CHEST 1 VIEW COMPARISON:  Chest radiograph 03/18/2023. FINDINGS: Right chest 2 lead AICD with both leads projecting over the right ventricle. Clear lungs. Stable cardiac and mediastinal contours. No pleural effusion or pneumothorax. Visualized bones and upper abdomen are unremarkable. IMPRESSION: No evidence of acute cardiopulmonary disease. Electronically Signed   By: Orvan Falconer M.D.   On: 03/21/2023 12:45   DG Swallowing Func-Speech Pathology  Result Date: 03/21/2023 Table formatting from the original result was not included. 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  Swallow evaluation ordered. Clinical Impression: Clinical Impression: Patient presents with mild oral (likely neuro based) and mild likley neuro based pharyngeal and moderate obstructive pharyngeal dysphagia (from cervical osteophytes).  Above deficits result in decreased oral coordination/transiting of boluses - more so with  Birth:  [birth year]       BSA:          2.108 m Patient Age:    67 years       BP:           176/102 mmHg Patient Gender: M              HR:           48 bpm. Exam Location:  Inpatient Procedure: 2D Echo, Cardiac Doppler and Color Doppler Indications:    CHF-Acute Diastolic I50.31  History:        Patient has prior history of Echocardiogram examinations, most                 recent 01/22/2019. CHF and Cardiomyopathy, Stroke; Risk                 Factors:Hypertension and Current Smoker.  Sonographer:    Lucendia Herrlich RCS Referring Phys: [id number]  CHRISTOPHER P DANFORD IMPRESSIONS  1. Left ventricular ejection fraction, by estimation, is 45%. The left ventricle has mildly decreased function. The left ventricle demonstrates global hypokinesis. There is moderate concentric left ventricular hypertrophy. Left ventricular diastolic parameters are consistent with Grade I diastolic dysfunction (impaired relaxation).  2. Right ventricular systolic function is normal. The right ventricular size is normal. There is normal pulmonary artery systolic pressure. The estimated right ventricular systolic pressure is 16.7 mmHg.  3. The mitral valve is normal in structure. Trivial mitral valve regurgitation. No evidence of mitral stenosis.  4. The aortic valve is tricuspid. There is mild calcification of the aortic valve. Aortic valve regurgitation is not visualized. No aortic stenosis is present.  5. The inferior vena cava is normal in size with greater than 50% respiratory variability, suggesting right atrial pressure of 3 mmHg. FINDINGS  Left Ventricle: Left ventricular ejection fraction, by estimation, is 45%. The left ventricle has mildly decreased function. The left ventricle demonstrates global hypokinesis. The left ventricular internal cavity size was normal in size. There is moderate concentric left ventricular hypertrophy. Left ventricular diastolic parameters are consistent with Grade I diastolic dysfunction (impaired relaxation). Right Ventricle: The right ventricular size is normal. No increase in right ventricular wall thickness. Right ventricular systolic function is normal. There is normal pulmonary artery systolic pressure. The tricuspid regurgitant velocity is 1.85 m/s, and  with an assumed right atrial pressure of 3 mmHg, the estimated right ventricular systolic pressure is 16.7 mmHg. Left Atrium: Left atrial size was normal in size. Right Atrium: Right atrial size was normal in size. Pericardium: Trivial pericardial effusion is present. Mitral Valve: The  mitral valve is normal in structure. Trivial mitral valve regurgitation. No evidence of mitral valve stenosis. Tricuspid Valve: The tricuspid valve is normal in structure. Tricuspid valve regurgitation is mild. Aortic Valve: The aortic valve is tricuspid. There is mild calcification of the aortic valve. Aortic valve regurgitation is not visualized. No aortic stenosis is present. Aortic valve peak gradient measures 4.1 mmHg. Pulmonic Valve: The pulmonic valve was normal in structure. Pulmonic valve regurgitation is not visualized. Aorta: The aortic root is normal in size and structure. Venous: The inferior vena cava is normal in size with greater than 50% respiratory variability, suggesting right atrial pressure of 3 mmHg. IAS/Shunts: No atrial level shunt detected by color flow Doppler. Additional Comments: A device lead is visualized in the right ventricle.  LEFT VENTRICLE PLAX 2D LVIDd:         4.65 cm  Physician Discharge Summary  DEANNA HANISH ZOX:[id number] DOB: 1955-09-23 DOA: 03/18/2023  PCP: Hillery Aldo, NP  Admit date: 03/18/2023 Discharge date: 03/22/2023    Admitted From: Home Disposition: SNF  Recommendations for Outpatient Follow-up:  Follow up with PCP in 1-2 weeks Please obtain BMP/CBC in one week Please follow up with your PCP on the following pending results: Unresulted Labs (From admission, onward)     Start     Ordered   03/25/23 0500  Creatinine, serum  (enoxaparin (LOVENOX)    CrCl >/= 30 ml/min)  Weekly,   R     Comments: while on enoxaparin therapy    03/18/23 2200              Home Health: None  Equipment/Devices: None  Discharge Condition: Stable but poor prognosis and long run CODE STATUS: DNR Diet recommendation: Cardiac  Subjective: Seen and examined.  Complains of neck pain and back pain due to position.  Otherwise has no complaints.  Brief/Interim Summary: Mr. Schuhmacher is a 67 y.o. 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   Evidently, APS came to the home to check on patient on day of admission, found him unable to walk or care for himself due to leg swelling, intoxicated.   In the ER, CXR showed cardiomegaly.  He had marked LE edema.  Potassium was 2.4 and he was hypothermic rectal temp 95.  Aching all over, but no focal pain complaints to me.Placed on warming blanket, given supplemental K and admitted under hospital service.  Details below.   Alcohol intoxication (HCC) Alcohol dependence Presents with BAL 245 mg/dL. Says his sisters provide it for him.  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   Does not desire to quit, tells his wife "I've been drinking 70 years  and I don't want to stop".  He was treated with Librium tapering protocol.  Did not have any withdrawal at all.   Hypothermia Probably from alcohol intoxication, similar to 12/2013 admission.  Resolved.  TSH normal.   Leg swelling and history of nonischemic cardiomyopathy EF 25-30% in 2015, recovered by 2020 to 60-65% echo this admission showed reduced ejection fraction of 45%, likely due to chronic alcoholism, nonischemic.  Doesn't follow with Cardiology.  Now with 2-3 months progressive leg swelling.  CXR shows cardiomegaly.  Probably nonadherent to diuretics (and unclear from med list if he is on furosemide or torsemide).  Not hypoxic.  BNP within normal limit.  He is asymptomatic.  Not a candidate for any intervention due to history of noncompliance/nonadherence.  If desired, outpatient cardiology consultation can be obtained.  Will defer to PCP.  Discharging on Plavix, torsemide, Aldactone, Coreg and ramipril.  Highly encouraged to remain compliant to medications and follow-up with cardiology as outpatient.   Hypokalemia: Resolved.   Hypomagnesemia/hypophosphatemia: Replenished    Protein-calorie malnutrition, severe (HCC) As evidenced by severe loss of subcutaneous muscle mass and fat - Consulted dietitian.  Should be supplemented with protein shakes.   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  No clinically observed bleeding or reports of bleeding.  Hemoglobin stable.   AICD (automatic cardioverter/defibrillator) present   History of CVA (cerebrovascular accident) - Continue Plavix, statin   HTN (hypertension) Blood pressure slightly on the low side.  Physician Discharge Summary  DEANNA HANISH ZOX:[id number] DOB: 1955-09-23 DOA: 03/18/2023  PCP: Hillery Aldo, NP  Admit date: 03/18/2023 Discharge date: 03/22/2023    Admitted From: Home Disposition: SNF  Recommendations for Outpatient Follow-up:  Follow up with PCP in 1-2 weeks Please obtain BMP/CBC in one week Please follow up with your PCP on the following pending results: Unresulted Labs (From admission, onward)     Start     Ordered   03/25/23 0500  Creatinine, serum  (enoxaparin (LOVENOX)    CrCl >/= 30 ml/min)  Weekly,   R     Comments: while on enoxaparin therapy    03/18/23 2200              Home Health: None  Equipment/Devices: None  Discharge Condition: Stable but poor prognosis and long run CODE STATUS: DNR Diet recommendation: Cardiac  Subjective: Seen and examined.  Complains of neck pain and back pain due to position.  Otherwise has no complaints.  Brief/Interim Summary: Mr. Schuhmacher is a 67 y.o. 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   Evidently, APS came to the home to check on patient on day of admission, found him unable to walk or care for himself due to leg swelling, intoxicated.   In the ER, CXR showed cardiomegaly.  He had marked LE edema.  Potassium was 2.4 and he was hypothermic rectal temp 95.  Aching all over, but no focal pain complaints to me.Placed on warming blanket, given supplemental K and admitted under hospital service.  Details below.   Alcohol intoxication (HCC) Alcohol dependence Presents with BAL 245 mg/dL. Says his sisters provide it for him.  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   Does not desire to quit, tells his wife "I've been drinking 70 years  and I don't want to stop".  He was treated with Librium tapering protocol.  Did not have any withdrawal at all.   Hypothermia Probably from alcohol intoxication, similar to 12/2013 admission.  Resolved.  TSH normal.   Leg swelling and history of nonischemic cardiomyopathy EF 25-30% in 2015, recovered by 2020 to 60-65% echo this admission showed reduced ejection fraction of 45%, likely due to chronic alcoholism, nonischemic.  Doesn't follow with Cardiology.  Now with 2-3 months progressive leg swelling.  CXR shows cardiomegaly.  Probably nonadherent to diuretics (and unclear from med list if he is on furosemide or torsemide).  Not hypoxic.  BNP within normal limit.  He is asymptomatic.  Not a candidate for any intervention due to history of noncompliance/nonadherence.  If desired, outpatient cardiology consultation can be obtained.  Will defer to PCP.  Discharging on Plavix, torsemide, Aldactone, Coreg and ramipril.  Highly encouraged to remain compliant to medications and follow-up with cardiology as outpatient.   Hypokalemia: Resolved.   Hypomagnesemia/hypophosphatemia: Replenished    Protein-calorie malnutrition, severe (HCC) As evidenced by severe loss of subcutaneous muscle mass and fat - Consulted dietitian.  Should be supplemented with protein shakes.   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  No clinically observed bleeding or reports of bleeding.  Hemoglobin stable.   AICD (automatic cardioverter/defibrillator) present   History of CVA (cerebrovascular accident) - Continue Plavix, statin   HTN (hypertension) Blood pressure slightly on the low side.

## 2023-03-22 NOTE — Progress Notes (Signed)
Received care of patient. Report received from [place], [place]. Patient resting comfortably.

## 2023-03-22 NOTE — Progress Notes (Addendum)
Speech Language Pathology Treatment: Dysphagia  Patient Details Name: [name] Phillips MRN: [id number] DOB: May 20, 1956 Today's Date: 03/22/2023 Time: 1335-1400 SLP Time Calculation (min) (ACUTE ONLY): 25 min  Assessment / Plan / Recommendation Clinical Impression  Patient seen today to address his swallow function.  Greeted patient eating his lunch slightly leaning to the left.  Obtained assistance to slight patient up in bed for optimal positioning.  Observed patient eating lunch including salad, applesauce and tea.  Patient demonstrating cough with approximately 80% of swallows he demonstrates decreased awareness causing SLP to suspect this is a chronic issue.  Patient admits to as much.  He benefited from cues to turn his head to the left conduct dry swallows and most importantly to cough and expectorate if his voice is wet or he was reflexively coughing.  Reviewed results of modified barium swallow study with patient's informing him that his osteophytes and deconditioning (and likely prior lacunar CVAs) are the source of his deficits.  Advised to work with an SLP at skilled nursing facility to maximize swallow strength to can continue to compensate for his osteophytes as much as able.  Using teach back with written precautions, patient educated.  Also provided information on antichoking device (I.e. LifeVac) and recommended his wife consider purchasing this for him given his level of dysphagia.  Patient was able to articulate that water is the safest liquid if it but he advised he does not particularly care for water.  SLP removed patient's tray and set a few items off to the side after he continued to cough with all intake concerning for pharyngeal retention and not clearing and worsening aspiration.    SlP printed MBS report and placed in pt's discharge package for SLP at SNF.   Recommended he try to consume several small meals and to cease p.o. intake with overtly coughing.    HPI HPI: 67 yo male adm  to [facility] on 10/23 with weakness, patient reported fall at home - Has h/o COPD, ETOH use, malnutrition, tobacco use, CVA - chronic lacunar bilateral basal ganglia CVAs, 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  Swallow evaluation ordered.      SLP Plan  Other (Comment) (Patient to discharge to SNF today)      Recommendations for follow up therapy are one component of a multi-disciplinary discharge planning process, led by the attending physician.  Recommendations may be updated based on patient status, additional functional criteria and insurance authorization.    Recommendations  Diet recommendations: Regular;Thin liquid Liquids provided via: Cup;Straw Medication Administration: Other (Comment) Supervision: Intermittent supervision to cue for compensatory strategies Compensations: Slow rate;Small sips/bites;Follow solids with liquid;Multiple dry swallows after each bite/sip (Cough and expectorate if reflexively coughing) Postural Changes and/or Swallow Maneuvers: Seated upright 90 degrees;Upright 30-60 min after meal;Head turn left during swallow                  Oral care BID   Frequent or constant Supervision/Assistance Dysphagia, oropharyngeal phase (R13.12)     Other (Comment) (Patient to discharge to SNF today)    Rolena Infante, MS [facility] SLP Acute Rehab Services Office [phone]  Chales Abrahams  03/22/2023, 4:05 PM

## 2023-03-22 NOTE — Progress Notes (Signed)
D/c instructions called to Charnelle at [facility].. all questions answered, no further questions. Pt aware of d/c to rehab and agreeble. Wife called and made aware and agreeble.

## 2023-05-13 ENCOUNTER — Other Ambulatory Visit (HOSPITAL_COMMUNITY): Payer: Self-pay | Admitting: *Deleted

## 2023-05-13 DIAGNOSIS — R1310 Dysphagia, unspecified: Secondary | ICD-10-CM

## 2023-06-08 IMAGING — CT CT ABD-PELV W/ CM
3 of 5 series · 16 of 46 positions shown, 18 images · IV contrast (omnipaque)
Comparison: 08/06/2017 abdominal ultrasound. Abdominopelvic CT
05/14/2012

CLINICAL DATA: Pyelonephritis, complicated.

EXAM:
CT ABDOMEN AND PELVIS WITH CONTRAST
TECHNIQUE: Multidetector CT imaging of the abdomen and pelvis was performed
using the standard protocol following bolus administration of
intravenous contrast.
CONTRAST:  70mL OMNIPAQUE IOHEXOL 350 MG/ML SOLN

[Series 2: axial st · axial · 0.76mm/px · z∈[-426,-16]mm · 11 of 100 slices shown, 13 images]
[im 9/100  soft-tissue]
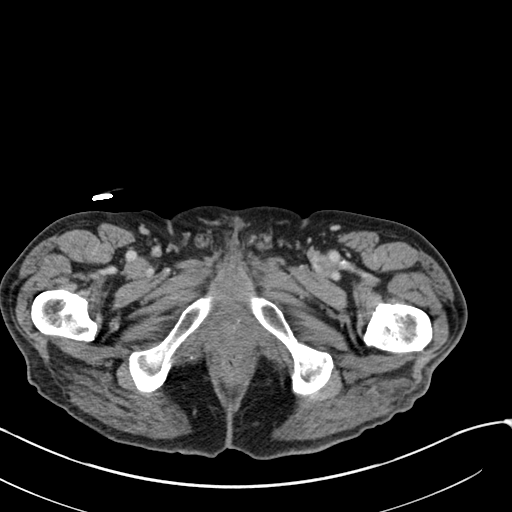
[im 9/100  bone]
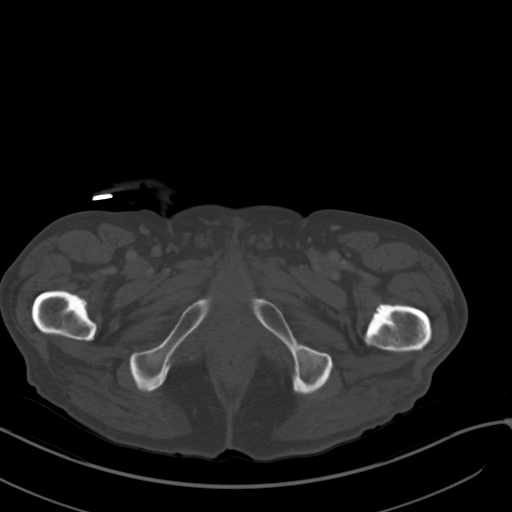
[im 17/100  soft-tissue]
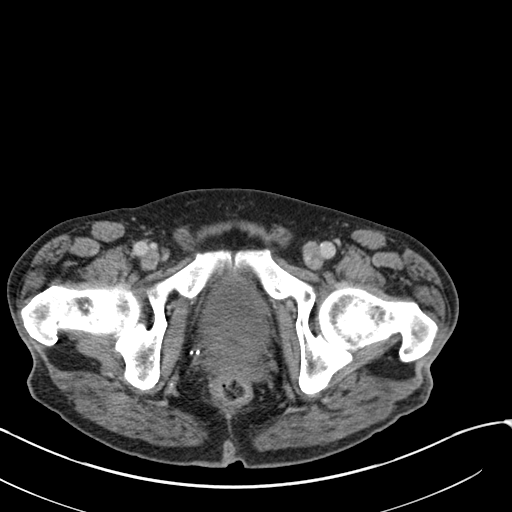
[im 25/100  soft-tissue]
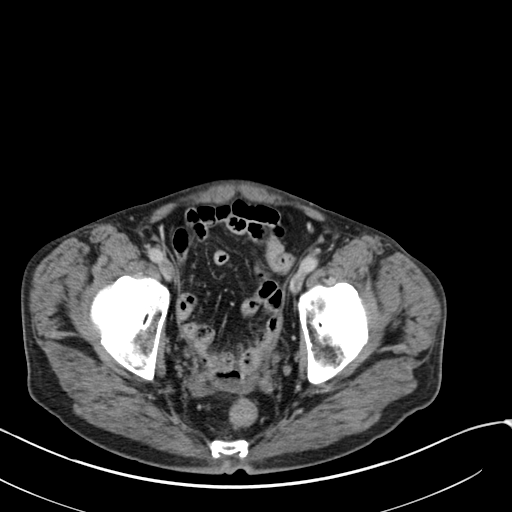
[im 34/100  soft-tissue]
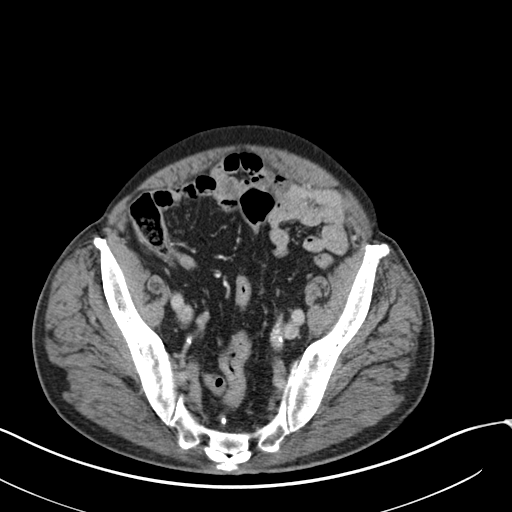
[im 42/100  soft-tissue]
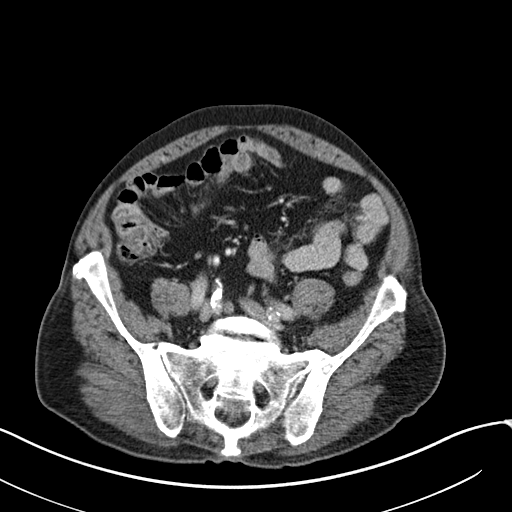
[im 50/100  soft-tissue]
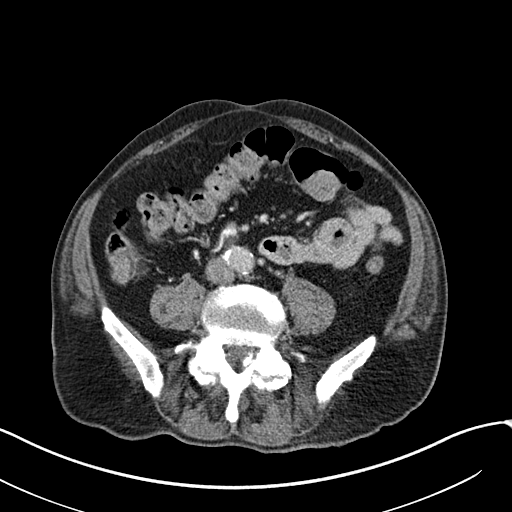
[im 58/100  soft-tissue]
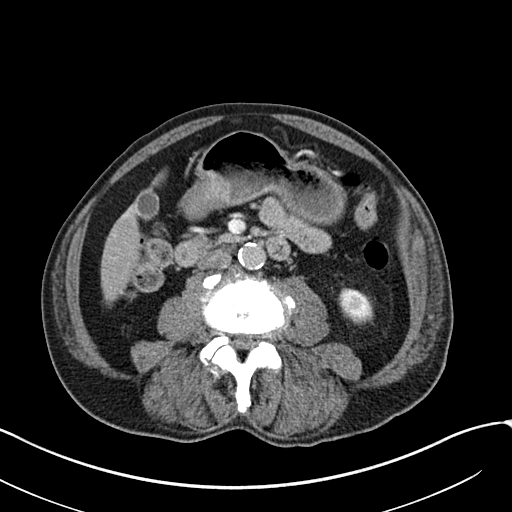
[im 67/100  soft-tissue]
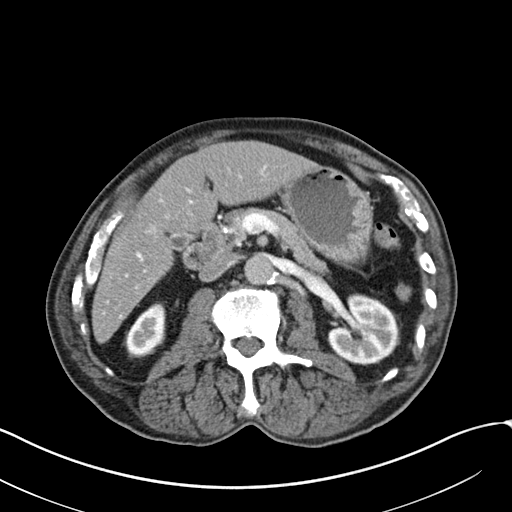
[im 75/100  soft-tissue]
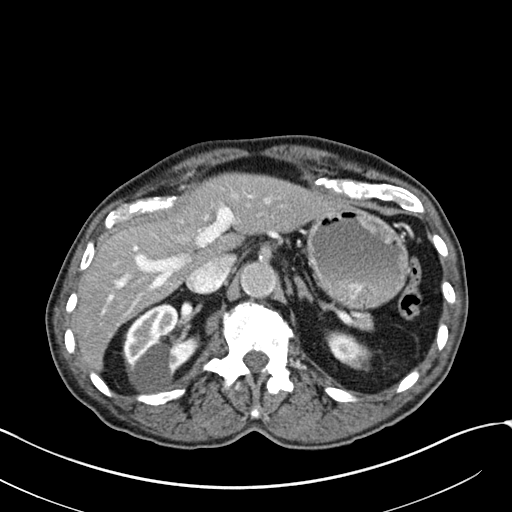
[im 75/100  bone]
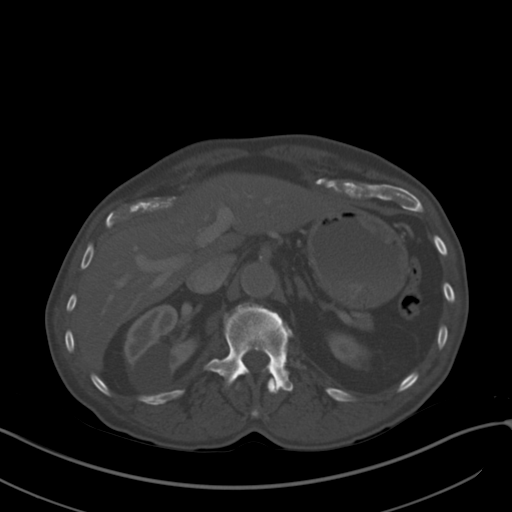
[im 83/100  soft-tissue]
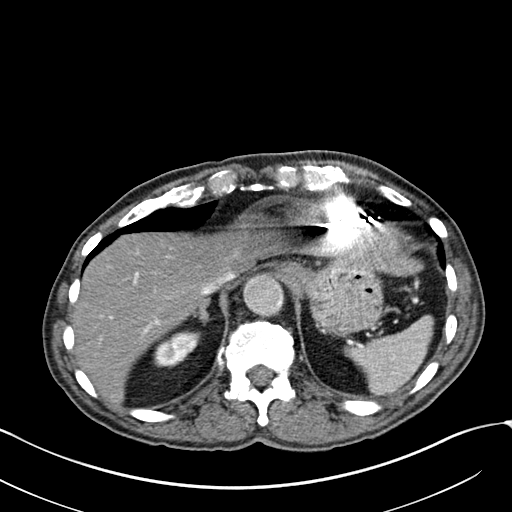
[im 91/100  soft-tissue]
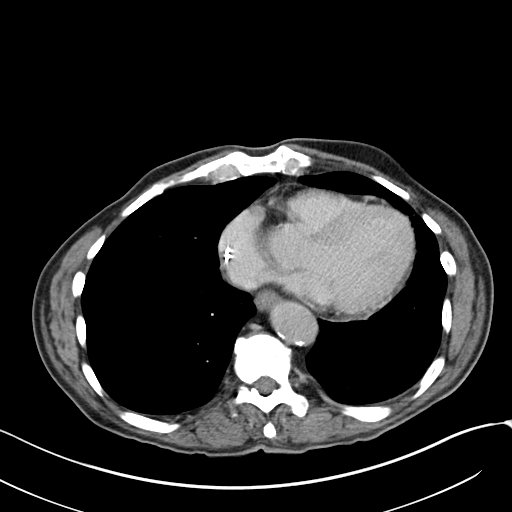

[Series 4: coronal st · coronal · 0.83mm/px · 3 of 150 slices shown]
[im 50/150  soft-tissue]
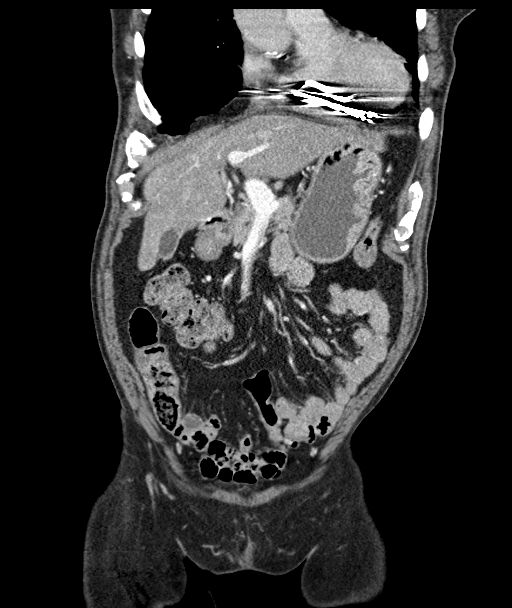
[im 67/150  soft-tissue]
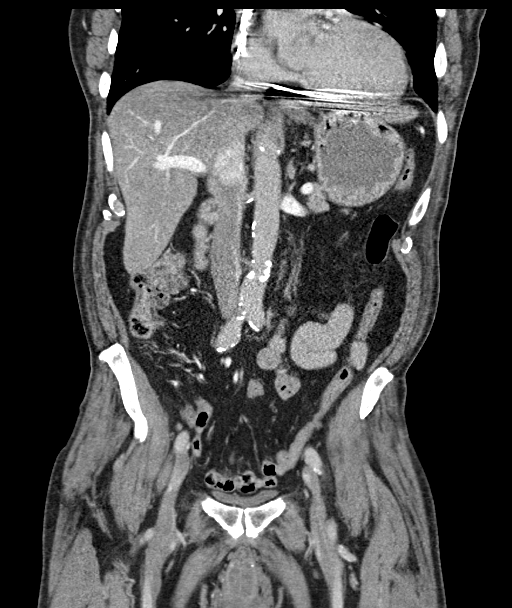
[im 83/150  soft-tissue]
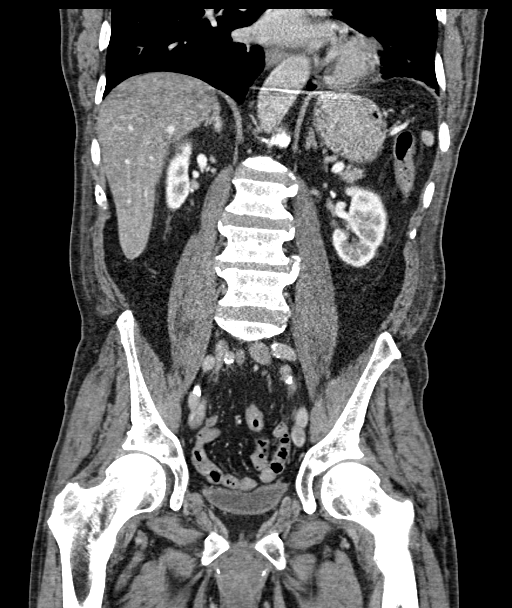

[Series 6: lung bases · axial · 0.76mm/px · z∈[-148,-116]mm · 2 of 98 slices shown]
[im 9/98  bone]
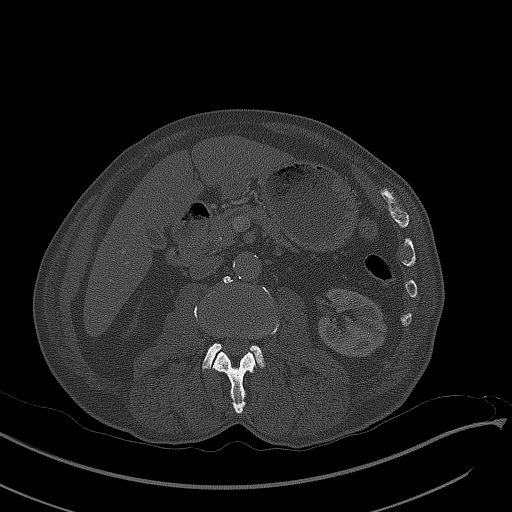
[im 25/98  bone]
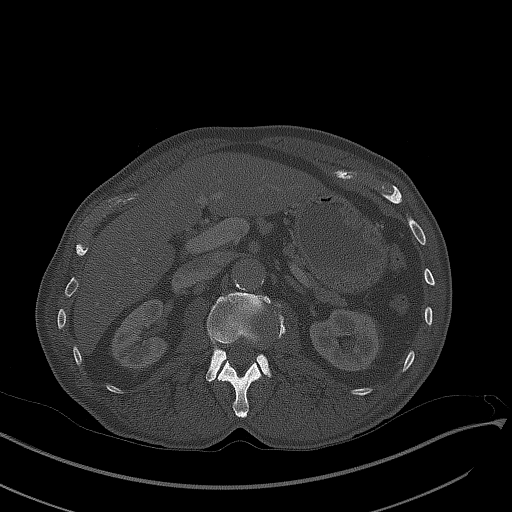

[16 of 46 positions shown; findings below may reference images not displayed]

FINDINGS: Lower chest: Left base scarring. Mild cardiomegaly. Lad and left
circumflex coronary artery calcification. Pacer.

Hepatobiliary: Moderate hepatic steatosis. Normal gallbladder,
without biliary ductal dilatation.

Pancreas: Pancreatic parenchymal calcifications within the uncinate
process. No duct dilatation or acute inflammation.

Spleen: Normal in size, without focal abnormality.

Adrenals/Urinary Tract: Normal right adrenal gland. Mild left
adrenal thickening. 3.0 cm interpolar right renal cyst. No
hydronephrosis. Normal left kidney. Normal urinary bladder.

Stomach/Bowel: Apparent gastric antral soft tissue fullness is
favored to be due to underdistention, as it is less impressive on
renal delays.

Colonic stool burden suggests constipation. Normal terminal ileum.
Appendix is normal including on coronal image 80. Normal small
bowel.

Vascular/Lymphatic: Aortic atherosclerosis. No abdominopelvic
adenopathy.

Reproductive: Normal prostate.

Other: No significant free fluid. No free intraperitoneal air.
Ventral abdominal wall laxity contains fat.

Musculoskeletal: Mild right-sided gynecomastia. Degenerative partial
fusion of bilateral sacroiliac joints.
IMPRESSION: 1. No acute process in the abdomen or pelvis.
2. Possible constipation.
3. Hepatic steatosis.
4. Chronic calcific pancreatitis.
5. Coronary artery atherosclerosis.
6. Aortic Atherosclerosis ([id number]).

## 2023-06-10 ENCOUNTER — Ambulatory Visit (HOSPITAL_COMMUNITY)
Admission: RE | Admit: 2023-06-10 | Discharge: 2023-06-10 | Disposition: A | Discharge status: Home / Self Care | Payer: 59 | Source: Ambulatory Visit | Attending: Student

## 2023-06-10 DIAGNOSIS — R1310 Dysphagia, unspecified: Secondary | ICD-10-CM

## 2023-06-10 DIAGNOSIS — R1312 Dysphagia, oropharyngeal phase: Secondary | ICD-10-CM | POA: Diagnosis present

## 2023-06-10 NOTE — Therapy (Signed)
 Modified Barium Swallow Study  Patient Details  Name: KERBY BORNER MRN: [id number] Date of Birth: 1955/07/09  Today's Date: 06/10/2023  Modified Barium Swallow completed.  Full report located under Chart Review in the Imaging Section.  History of Present Illness Issacc Merlo is a 68 y.o. male with PMH: COPD, ETOH use, tobacco use, malnutrition, CVA - chronic lacunar bilateral basal ganglia CVAs, 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  He was admitted at the hospital October of 2024 and had an MBS on 10/25 which reported mild oropharyngeal dyspahgia and moderate obstructive pharyngeal dysphagia from cervical osteophytes. He presented for this OP MBS due to concern for ongoing and potentially increased frequency of dysphagia symptoms with his spouse indicating that coughing mostly occurs when he eats too quickly.   Clinical Impression Patient presents with a mild oropharyngeal dysphagia which does not appear to be significantly changed since previous MBS in October of 2024, but with his primary dysphagia continuing to be structural in nature from known cervical osteophytes. Anterior hyoid excursion and laryngeal elevation both partial in completion. Epiglottic inversion was incomplete with epiglottis coming into contact with and impeded by protrustion of posterior pharyngeal wall from cervical osteophyte. Swallow initiated at pyriform sinus with thin liquids and at vallecular sinus with solids and thick liquids. No penetration or aspiration occured. Trace pharyngeal residuals observed in pyriform sinus, posterior pharyngeal wall and aryepitlottic folds. With thicker liquids, puree solids, mechanical soft solids, mild-moderate residuals remained at level of posterior pharyngeal wall at level of cervical osteophytes as well as in pyriform sinus. Residuals cleared to trace-mild with cued secondary swallow. No retrograde movement of barium and PES opening and  clearance of barium through upper esophagus appeared [facility]. SLP discussed results with patient and spouse with recommendation to swallow twice with solids, alternate sips of liquids with bites of solids and to refrain from eating too quickly. Factors that may increase risk of adverse event in presence of aspiration Noe & Lianne 2021): Limited mobility  Swallow Evaluation Recommendations Recommendations: PO diet PO Diet Recommendation: Regular;Dysphagia 3 (Mechanical soft);Thin liquids (Level 0) Liquid Administration via: Cup;Straw Medication Administration: Other (Comment) Supervision: Patient able to self-feed Swallowing strategies  : Slow rate;Small bites/sips;Multiple dry swallows after each bite/sip;Follow solids with liquids Postural changes: Position pt fully upright for meals    Norleen IVAR Blase, MA, CCC-SLP Speech Therapy

## 2024-02-11 ENCOUNTER — Encounter: Payer: Self-pay | Admitting: Podiatry

## 2024-02-11 ENCOUNTER — Ambulatory Visit (INDEPENDENT_AMBULATORY_CARE_PROVIDER_SITE_OTHER): Admitting: Podiatry

## 2024-02-11 DIAGNOSIS — M79675 Pain in left toe(s): Secondary | ICD-10-CM

## 2024-02-11 DIAGNOSIS — B351 Tinea unguium: Secondary | ICD-10-CM | POA: Diagnosis not present

## 2024-02-11 DIAGNOSIS — M79674 Pain in right toe(s): Secondary | ICD-10-CM

## 2024-02-11 NOTE — Progress Notes (Signed)
  Subjective:  Patient ID: [name] Phillips, male    DOB: 1955/08/05,   MRN: [id number]  Chief Complaint  Patient presents with   Nail Problem    My toenails are too long.    68 y.o. male presents for concern of thickened elongated and painful nails that are difficult to trim. Requesting to have them trimmed today.   PCP:  Campbell Reynolds, NP    . Denies any other pedal complaints. Denies n/v/f/c.   Past Medical History:  Diagnosis Date     6949 Lead 11/12/2012   Acute kidney injury (HCC) 08/20/2017   AICD (automatic cardioverter/defibrillator) present 08/08/2011   Alcohol  abuse    Arthritis    CHF (congestive heart failure) (HCC)    CVA (cerebral vascular accident) (HCC)    Fall at home, initial encounter 11/25/2017   Hypertension    Nonischemic cardiomyopathy (HCC)    a. Catheterization 2005 normal coronary arteries; EF 15-20%; b. 2011 EF normal;  c. 2015 Echo: EF 25-30%;  d. s/p prior AICD with upgrade to MDT single lead ICD ser # [id number] h 2/2 ERI &12/19/2014).   Seizure (HCC)    Seizure disorder (HCC) 08/08/2011    Objective:  Physical Exam: Vascular: DP/PT pulses 2/4 bilateral. CFT <3 seconds. Asebent hair growth and edema noted bilateral.  Skin. No lacerations or abrasions bilateral feet. Nails 1-5 bilateral thickened and elongated.  Musculoskeletal: MMT 5/5 bilateral lower extremities in DF, PF, Inversion and Eversion. Deceased ROM in DF of ankle joint.  Neurological: Sensation intact to light touch.   Assessment:   1. Pain due to onychomycosis of toenails of both feet      Plan:  Patient was evaluated and treated and all questions answered. -Discussed supportive shoes at all times and checking feet regularly.  -Mechanically debrided all nails 1-5 bilateral using sterile nail nipper and filed with dremel without incident  -Answered all patient questions -Patient to return  in 3 months for at risk foot care -Patient advised to call the office if any problems or  questions arise in the meantime.   Asberry Failing, DPM

## 2024-05-12 ENCOUNTER — Ambulatory Visit: Admitting: Podiatry

## 2024-05-19 ENCOUNTER — Ambulatory Visit: Admitting: Podiatry
